# Patient Record
Sex: Male | Born: 1935 | Race: White | Hispanic: No | Smoking: Former smoker
Health system: Southern US, Community
[De-identification: ages and names within clinical notes are randomized; demographics above are authoritative.]

## PROBLEM LIST (undated history)

## (undated) DIAGNOSIS — R609 Edema, unspecified: Secondary | ICD-10-CM

## (undated) DIAGNOSIS — M5136 Other intervertebral disc degeneration, lumbar region: Secondary | ICD-10-CM

## (undated) DIAGNOSIS — R945 Abnormal results of liver function studies: Secondary | ICD-10-CM

## (undated) DIAGNOSIS — N400 Enlarged prostate without lower urinary tract symptoms: Secondary | ICD-10-CM

## (undated) DIAGNOSIS — N529 Male erectile dysfunction, unspecified: Secondary | ICD-10-CM

## (undated) DIAGNOSIS — E785 Hyperlipidemia, unspecified: Secondary | ICD-10-CM

## (undated) DIAGNOSIS — K226 Gastro-esophageal laceration-hemorrhage syndrome: Secondary | ICD-10-CM

## (undated) DIAGNOSIS — M6282 Rhabdomyolysis: Secondary | ICD-10-CM

## (undated) DIAGNOSIS — N323 Diverticulum of bladder: Secondary | ICD-10-CM

## (undated) DIAGNOSIS — K219 Gastro-esophageal reflux disease without esophagitis: Secondary | ICD-10-CM

## (undated) DIAGNOSIS — D649 Anemia, unspecified: Secondary | ICD-10-CM

## (undated) DIAGNOSIS — B029 Zoster without complications: Secondary | ICD-10-CM

## (undated) DIAGNOSIS — G319 Degenerative disease of nervous system, unspecified: Secondary | ICD-10-CM

## (undated) DIAGNOSIS — N4340 Spermatocele of epididymis, unspecified: Secondary | ICD-10-CM

## (undated) DIAGNOSIS — M419 Scoliosis, unspecified: Secondary | ICD-10-CM

## (undated) DIAGNOSIS — K449 Diaphragmatic hernia without obstruction or gangrene: Secondary | ICD-10-CM

## (undated) DIAGNOSIS — M810 Age-related osteoporosis without current pathological fracture: Secondary | ICD-10-CM

## (undated) DIAGNOSIS — R531 Weakness: Secondary | ICD-10-CM

## (undated) DIAGNOSIS — M1990 Unspecified osteoarthritis, unspecified site: Secondary | ICD-10-CM

## (undated) DIAGNOSIS — R7989 Other specified abnormal findings of blood chemistry: Secondary | ICD-10-CM

## (undated) DIAGNOSIS — N179 Acute kidney failure, unspecified: Secondary | ICD-10-CM

## (undated) DIAGNOSIS — Z9181 History of falling: Secondary | ICD-10-CM

## (undated) DIAGNOSIS — I679 Cerebrovascular disease, unspecified: Secondary | ICD-10-CM

## (undated) DIAGNOSIS — Z8719 Personal history of other diseases of the digestive system: Secondary | ICD-10-CM

## (undated) DIAGNOSIS — I10 Essential (primary) hypertension: Secondary | ICD-10-CM

## (undated) DIAGNOSIS — M545 Low back pain: Secondary | ICD-10-CM

## (undated) HISTORY — DX: Weakness: R53.1

## (undated) HISTORY — DX: Other intervertebral disc degeneration, lumbar region: M51.36

## (undated) HISTORY — DX: Diverticulum of bladder: N32.3

## (undated) HISTORY — PX: REPLACEMENT TOTAL KNEE BILATERAL: SUR1225

## (undated) HISTORY — DX: Diaphragmatic hernia without obstruction or gangrene: K44.9

## (undated) HISTORY — DX: Age-related osteoporosis without current pathological fracture: M81.0

## (undated) HISTORY — DX: Personal history of other diseases of the digestive system: Z87.19

## (undated) HISTORY — DX: Spermatocele of epididymis, unspecified: N43.40

## (undated) HISTORY — DX: Essential (primary) hypertension: I10

## (undated) HISTORY — DX: Zoster without complications: B02.9

## (undated) HISTORY — DX: Cerebrovascular disease, unspecified: I67.9

## (undated) HISTORY — DX: Anemia, unspecified: D64.9

## (undated) HISTORY — DX: Unspecified osteoarthritis, unspecified site: M19.90

## (undated) HISTORY — DX: Male erectile dysfunction, unspecified: N52.9

## (undated) HISTORY — DX: Hypercalcemia: E83.52

## (undated) HISTORY — DX: Edema, unspecified: R60.9

## (undated) HISTORY — DX: Scoliosis, unspecified: M41.9

## (undated) HISTORY — DX: History of falling: Z91.81

## (undated) HISTORY — DX: Gastro-esophageal reflux disease without esophagitis: K21.9

## (undated) HISTORY — DX: Rhabdomyolysis: M62.82

## (undated) HISTORY — DX: Gastro-esophageal laceration-hemorrhage syndrome: K22.6

## (undated) HISTORY — DX: Acute kidney failure, unspecified: N17.9

## (undated) HISTORY — DX: Low back pain: M54.5

## (undated) HISTORY — DX: Benign prostatic hyperplasia without lower urinary tract symptoms: N40.0

## (undated) HISTORY — PX: X-STOP IMPLANTATION: SHX2677

## (undated) HISTORY — DX: Hyperlipidemia, unspecified: E78.5

## (undated) HISTORY — DX: Abnormal results of liver function studies: R94.5

## (undated) HISTORY — DX: Degenerative disease of nervous system, unspecified: G31.9

## (undated) HISTORY — DX: Other specified abnormal findings of blood chemistry: R79.89

---
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
## 1984-08-18 DIAGNOSIS — Z8719 Personal history of other diseases of the digestive system: Secondary | ICD-10-CM

## 1984-08-18 DIAGNOSIS — K226 Gastro-esophageal laceration-hemorrhage syndrome: Secondary | ICD-10-CM

## 1984-08-18 HISTORY — DX: Personal history of other diseases of the digestive system: Z87.19

## 1984-08-18 HISTORY — DX: Gastro-esophageal laceration-hemorrhage syndrome: K22.6

## 2001-04-27 ENCOUNTER — Ambulatory Visit (HOSPITAL_COMMUNITY): Admission: RE | Admit: 2001-04-27 | Discharge: 2001-04-27 | Payer: Self-pay | Admitting: *Deleted

## 2001-04-27 ENCOUNTER — Encounter (INDEPENDENT_AMBULATORY_CARE_PROVIDER_SITE_OTHER): Payer: Self-pay | Admitting: *Deleted

## 2002-08-18 HISTORY — PX: OTHER SURGICAL HISTORY: SHX169

## 2003-07-12 ENCOUNTER — Encounter: Admission: RE | Admit: 2003-07-12 | Discharge: 2003-07-12 | Payer: Self-pay | Admitting: Internal Medicine

## 2004-08-18 HISTORY — PX: INGUINAL HERNIA REPAIR: SUR1180

## 2004-11-26 ENCOUNTER — Ambulatory Visit (HOSPITAL_COMMUNITY): Admission: RE | Admit: 2004-11-26 | Discharge: 2004-11-26 | Payer: Self-pay | Admitting: General Surgery

## 2004-11-26 ENCOUNTER — Ambulatory Visit (HOSPITAL_BASED_OUTPATIENT_CLINIC_OR_DEPARTMENT_OTHER): Admission: RE | Admit: 2004-11-26 | Discharge: 2004-11-26 | Payer: Self-pay | Admitting: General Surgery

## 2005-06-18 ENCOUNTER — Encounter: Admission: RE | Admit: 2005-06-18 | Discharge: 2005-06-18 | Payer: Self-pay | Admitting: Orthopaedic Surgery

## 2005-07-09 ENCOUNTER — Encounter: Admission: RE | Admit: 2005-07-09 | Discharge: 2005-07-09 | Payer: Self-pay | Admitting: Orthopaedic Surgery

## 2005-07-24 ENCOUNTER — Encounter: Admission: RE | Admit: 2005-07-24 | Discharge: 2005-07-24 | Payer: Self-pay | Admitting: Orthopaedic Surgery

## 2006-03-24 ENCOUNTER — Inpatient Hospital Stay (HOSPITAL_COMMUNITY): Admission: RE | Admit: 2006-03-24 | Discharge: 2006-03-27 | Payer: Self-pay | Admitting: Orthopaedic Surgery

## 2006-03-24 ENCOUNTER — Encounter (INDEPENDENT_AMBULATORY_CARE_PROVIDER_SITE_OTHER): Payer: Self-pay | Admitting: *Deleted

## 2007-04-27 ENCOUNTER — Inpatient Hospital Stay (HOSPITAL_COMMUNITY): Admission: RE | Admit: 2007-04-27 | Discharge: 2007-04-30 | Payer: Self-pay | Admitting: Orthopaedic Surgery

## 2007-10-04 ENCOUNTER — Encounter: Admission: RE | Admit: 2007-10-04 | Discharge: 2007-10-04 | Payer: Self-pay | Admitting: Orthopaedic Surgery

## 2007-10-20 ENCOUNTER — Encounter: Admission: RE | Admit: 2007-10-20 | Discharge: 2007-10-20 | Payer: Self-pay | Admitting: Orthopaedic Surgery

## 2007-12-22 ENCOUNTER — Encounter: Admission: RE | Admit: 2007-12-22 | Discharge: 2008-03-21 | Payer: Self-pay | Admitting: Internal Medicine

## 2008-09-29 ENCOUNTER — Encounter: Admission: RE | Admit: 2008-09-29 | Discharge: 2008-09-29 | Payer: Self-pay | Admitting: Orthopaedic Surgery

## 2008-12-28 ENCOUNTER — Encounter: Admission: RE | Admit: 2008-12-28 | Discharge: 2008-12-28 | Payer: Self-pay | Admitting: Neurosurgery

## 2009-07-03 ENCOUNTER — Inpatient Hospital Stay (HOSPITAL_COMMUNITY): Admission: RE | Admit: 2009-07-03 | Discharge: 2009-07-06 | Payer: Self-pay | Admitting: Neurosurgery

## 2009-10-16 HISTORY — PX: BACK SURGERY: SHX140

## 2010-11-20 LAB — CBC
HCT: 42.4 % (ref 39.0–52.0)
Hemoglobin: 14.8 g/dL (ref 13.0–17.0)
MCHC: 34.9 g/dL (ref 30.0–36.0)
MCV: 98.3 fL (ref 78.0–100.0)
Platelets: 217 10*3/uL (ref 150–400)
RBC: 4.31 MIL/uL (ref 4.22–5.81)
RDW: 15.1 % (ref 11.5–15.5)
WBC: 7.3 10*3/uL (ref 4.0–10.5)

## 2010-11-20 LAB — BASIC METABOLIC PANEL
BUN: 14 mg/dL (ref 6–23)
CO2: 32 mEq/L (ref 19–32)
Calcium: 9.4 mg/dL (ref 8.4–10.5)
Chloride: 100 mEq/L (ref 96–112)
Creatinine, Ser: 1 mg/dL (ref 0.4–1.5)
GFR calc Af Amer: >60 mL/min (ref >60)
GFR calc non Af Amer: >60 mL/min (ref >60)
Glucose, Bld: 85 mg/dL (ref 70–99)
Potassium: 4.5 mEq/L (ref 3.5–5.1)
Sodium: 137 mEq/L (ref 135–145)

## 2010-12-31 NOTE — Discharge Summary (Signed)
NAMESAMPSON, SELF                  ACCOUNT NO.:  [id number]   MEDICAL RECORD NO.:  [id number]          PATIENT TYPE:  INP   LOCATION:  5030                         FACILITY:  [facility]   PHYSICIAN:  Claude Manges. Whitfield, M.D.DATE OF BIRTH:  Sep 12, 1924   DATE OF ADMISSION:  04/27/2007  DATE OF DISCHARGE:  04/30/2007                               DISCHARGE SUMMARY   ADMISSION DIAGNOSES:  1. Osteoarthritis, right knee.  2. Hyperlipidemia.  3. Benign prostatic hypertrophy.  4. Blind.   DISCHARGE DIAGNOSES:  1. Status post right total knee arthroplasty.  2. Hyperlipidemia.  3. Benign prostatic hypertrophy.  4. Hyponatremia, probably chronic.  5. Acute blood loss anemia secondary to surgery.   HISTORY OF PRESENT ILLNESS:  Mr. Winterhalter is an 77 year old male status post  left total knee with good results. He now has pain in right knee which  has been worsening. Previously noted to have OA of the right knee. Now  having pain with ADLs and also at night. Pain is described as a  constant, achy pain. Radiographs of the right knee show end-stage  osteoarthritis. The patient admitted to undergo right total knee  arthroplasty.   SURGICAL PROCEDURE:  The patient was taken to the operating room on  April 27, 2007 by Dr. Norlene Campbell assisted by Nathanial Rancher, P.A.-  C. The patient underwent general anesthesia and supplemental femoral  nerve block and then underwent a right total knee replacement using  computer navigation. The following components were used:  A large right  femoral component, a large metal-backed patella, size 5 tibial tray and  a 10-mm tibial bearing. The patient tolerated the procedure well and  returned to recovery room in good and stable condition.   CONSULTS:  The following consults were obtained while patient was  hospitalized:  PT, OT, case management, pharmacy.   HOSPITAL COURSE:  On postoperative day #1, the patient was afebrile.  Vital signs stable. H&H 12.2 and 35.5.  Sodium was 133, potassium 4.2.  PT/INR was 14.5 and 1.1.   On postoperative day #2, the patient's T-max was 100.2. Otherwise, vital  signs stable. H&H 10.9 and 31.7. White count was elevated at 11,600.  Incentive spirometry was then instituted. Was also noted patient had  hyponatremia, sodium 131. PT/INR was 17.7 and 1.4.   Postoperative day #3, the patient was sitting at bedside doing well. No  complaints. No shortness of breath. No chest pain. No dizziness. The  patient tolerated diet well. No bowel movement. Positive flatus.   Pain under good control. Patient afebrile. Vital signs stable. H&H 10.9  and 31.5. White count trended down to 10,100. Sodium was low at 130;  this was felt to be chronic in nature, as patient had sodium of 127 on  admission. Otherwise, the patient progressing well with physical therapy  and desiring to be discharged to [facility]. The  patient's PT/INR were as follows:  PT 17.3, INR 1.4.   MEDICATIONS ON FLOOR:  1. Coumadin as dosed by pharmacy at 1800 hours.  2. Heparin 3000 units subcutaneous q.12h. at 10 a.m. and 10  p.m. until      Coumadin therapeutic.  3. Colace 100 mg 1 p.o. b.i.d. at 10 a.m. and 10 p.m.  4. Protonix 40 mg 1 p.o. at 8 a.m.  5. Senokot 1 tablet p.o. p.r.n.  6. Percocet 5/325 one to two tablets p.o. q.4h. p.r.n. pain.  7. Tylenol 325 to 650 one p.o. q.4h. p.r.n.  8. Reglan 10 mg 1 p.o. q.8h. p.r.n.  9. Robaxin 500 mg 1 p.o. q.6h. p.r.n. spasm.   The patient will need to be on Coumadin for one month.   LABORATORY DATA:  Routine labs on admission:  CBC - white count 7300,  hemoglobin 15.5, hematocrit 45.6, platelets 285. Coags on admission - PT  13.4, INR 1; date of discharge - April 30, 2007, PT was 17.3 and INR  was 1.4.   Routine chemistries on admission - sodium 127 low, potassium 3.5,  chloride 88, bicarb 30, glucose 94, BUN 11, creatinine 0.89.   Hepatic enzymes on admission - all values within normal  limits.   UA on admission was negative. Urine culture:  9000 colonies per mL,  insignificant growth.   X-rays:  Preoperative chest x-ray two views dated April 21, 2007  showed evidence of old granulomatous disease, no active process.   EKG:  EKG dated April 21, 2007 showed sinus bradycardia. Heart was 97  beats per minute, PR interval 148 milliseconds, PRT axis 77/41/56.   DISCHARGE INSTRUCTIONS:  Floor medications will be adjusted by skilled  nursing facility physician.   DIET:  Advance as tolerated, regular diet.   SPECIAL INSTRUCTIONS:  The patient is 50% partial weight bearing with  walker. CPM is 0 to 90 degrees 6 to 8 hours a day, increase by 10  degrees daily.   WOUND CARE:  The wound is to be kept clean and dry. Change dressing  daily. May shower after two days with no drainage. Call with any signs  of infection, temperature greater than 101.5.   FOLLOW UP:  The patient needs followup with Dr. Cleophas Dunker in the office  10 days from discharge. Please call office at [phone]. Staples should  be removed at that time. X-rays will be taken.   CONDITION ON DISCHARGE:  The patient was discharged to skilled nursing  facility in good, stable condition.      Richardean Canal, P.A.      Claude Manges. Cleophas Dunker, M.D.  Electronically Signed    GC/MEDQ  D:  04/30/2007  T:  04/30/2007  Job:  [id number]   cc:   Claude Manges. Cleophas Dunker, M.D.

## 2010-12-31 NOTE — Op Note (Signed)
[name], [name]                  ACCOUNT NO.:  [id number]   MEDICAL RECORD NO.:  [id number]          PATIENT TYPE:  INP   LOCATION:  2550                         FACILITY:  [facility]   PHYSICIAN:  Claude Manges. Whitfield, M.D.DATE OF BIRTH:  07/26/1925   DATE OF PROCEDURE:  04/27/2007  DATE OF DISCHARGE:                               OPERATIVE REPORT   PREOPERATIVE DIAGNOSIS:  End-stage osteoarthritis right knee.   POSTOPERATIVE DIAGNOSIS:  End-stage osteoarthritis right knee.   PROCEDURE:  Right total knee replacement with computer navigation.   SURGEON:  Claude Manges. Cleophas Dunker, M.D.   ASSISTANT:  Arnoldo Morale, PAC.   ANESTHESIA:  General with supplemental femoral nerve block.   COMPLICATIONS:  None.   COMPONENTS:  DePuy LCS large femoral component.  A #5 rotating keeled  tibial component to 10 mm bridging bearing, a three peg metal backed  patella, ball was secured polymethyl methacrylate.   PROCEDURE:  With the patient comfortable on the operating table and  under general orotracheal anesthesia, nursing staff inserted a Foley  catheter.  Urine was clear yellow.  The patient did receive a  preoperative femoral nerve block for postoperative pain control.   The right lower extremity was placed in a thigh tourniquet.  The leg was  then prepped with Betadine scrub and DuraPrep from the thigh tourniquet  to the midfoot.  Sterile draping was performed.  The extremity still  elevated, it was Esmarch exsanguinated with a proximal tourniquet to 350  mmHg.   A midline longitudinal incision was made centered about the patella  extending from the superior pouch to the tibial tubercle via sharp  dissection, incision was carried down to subcutaneous tissue.  First  layer of capsule was incised in the midline.  A medial parapatellar  incision was made in a straight longitudinal line with the Bovie.  The  joint was entered with a clear yellow joint effusion.  The patella was  everted 180 degrees  and the knee flexed to 90 degrees.   There were large osteophytes along the medial and lateral femoral  condyle, complete absence of articular cartilage along the lateral  compartment and/or to great extent, the medial compartment.  The patient  did not have a fixed deformity as I could  correct the knee to neutral  from both varus and valgus position.   A synovectomy was performed.  Osteophytes were removed.   Computer navigation was utilized.  The Shantz pins were placed in the  distal femur x2 and the proximal tibia x2.  The computer arrays were  then applied.  The normal anatomical axis was determined and then  morphed points were established along the tibia and femur.  We had  templated a large femoral component #5 tibial component preoperatively.  The computer confirmed the above.   Based on the computer determinations, the first cut was made  transversely along the proximal tibia with about a 7-1/2 degree  posterior angle of declination.  We checked our alignment with excellent  initial cut.  We then established flexion/extension gaps in tension, we  were a little  bit tight medially and we released a little bit more along  the medial tibial plateau releasing the medial collateral ligament in a  subperiosteal fashion.  At that point we had symmetrical  flexion/extension gaps.   Subsequent cuts were then made on the femur, checking each after making  the cuts with a verifier.  The MCL and LCL remained intact throughout  the operative procedure.   Lamina spreader was then inserted along the medial and lateral  compartments using a Bovie.  Medial and lateral menisci were resected.  ACL and PCL were also resected.  Osteophytes along the posterior femoral  condyle were removed medially and laterally.  There was numerous loose  bodies that were removed that were less than a centimeter in diameter.  We did a synovectomy and there was no appreciable synovitis remaining.   Final cut  was then made on the femur for the tapered cuts along the  femoral condyle medially and laterally, anteriorly and posteriorly.   Retractor was then placed about the tibia.  We trialed a #5 tibial tray  which was confirmed with the computer.  Center hole was then made  followed by the keeled cut.  With the metallic tray in place, the trial  10 mm bridging bearing was then inserted followed by the trial femur.  The knee was then reduced and through full range of motion, there was no  opening with varus-valgus stress.  We had beautiful alignment of the  tibial tray without any malrotation.  We had full extension and flexion  well beyond 110 degrees.   The patella was then prepared by removing 12 mm of bone leaving 13 mm of  patella thickness.  The three-hole old guide was then inserted followed  by a trial patella.  This was reduced and with a full range of motion,  no subluxation.   Trial components were removed.  The joint was copiously irrigated with  jet saline.  The final components were then inserted with polymethyl  methacrylate.  This will be applied to the tibia removing any extraneous  methacrylate with a Glorious Peach.  The 10 mm bridging bearing was then applied  followed by the cemented large femoral component.  Against extraneous  methacrylate was removed.  The patella was applied with methacrylate and  patellar clamp.   After complete maturation of the methacrylate, the joint was inspected.  Any extraneous methacrylate was removed with an osteotome.  Wound was  again irrigated with the jet saline.   Bone wax was used to obtain hemostasis around any bleeding bone.  Tourniquet was deflated with immediate capillary refill to the wound.  Gross bleeders were Bovie coagulated.   The deep capsule was then closed with #1 Ethibond, superficial capsule  with a 2-0 Vicryl, subcu with 2-0 Vicryl, skin closed with skin clips.  Sterile bulky dressing was applied.   The patient tolerated  the procedure without complications.      Claude Manges. Cleophas Dunker, M.D.  Electronically Signed     PWW/MEDQ  D:  04/27/2007  T:  04/27/2007  Job:  [id number]

## 2011-01-03 NOTE — Op Note (Signed)
[place]  Patient:    [name], LISBON Visit Number: [id number] MRN: [id number]          Service Type: END Location: ENDO Attending Physician:  Sabino Gasser Proc. Date: 04/27/01 Admit Date:  04/27/2001                             Operative Report  PROCEDURE:  Upper endoscopy.  INDICATIONS:  GERD.  ANESTHESIA:  Demerol 50 mg, Versed 5 mg.  DESCRIPTION OF PROCEDURE:  With the patient nonlucidated and in the left lateral decubitus position, the Olympus videoscopic endoscope was inserted in the mouth and passed under direct vision through the esophagus.  There was a question of GERD and esophagitis and possible Barretts.  Photographs and biopsies were taken.  We entered into the stomach, fundus, body, and antrum. However, when we visualized antrum it showed questionable antritis.  It was also photographed and biopsied.  The duodenum bulb and second portion of duodenum appeared normal.  From this point, the endoscope was slowly withdrawn taking circumferential views of the entire duodenal mucosa until the endoscope then pulled back into the stomach, placed in retroflexion to view the stomach from below.  The endoscope was then straightened and withdrawn taking circumferential views of remaining gastric and esophageal mucosa stopping to photograph along the way.  The patients vital signs and pulse oximetry remained stable.  The patient tolerated the procedure well and without apparent complications.  FINDINGS:  Question of Barretts esophagus above the hiatal hernia.  Antritis. Both biopsied.  Await biopsy report.  The patient will call me for results and follow up with me as an outpatient.  Proceed to colonoscopy as planned. Attending Physician:  Sabino Gasser DD:  04/27/01 TD:  04/27/01 Job: [id number]

## 2011-01-03 NOTE — Op Note (Signed)
[name], [name]                  ACCOUNT NO.:  [id number]   MEDICAL RECORD NO.:  [id number]          PATIENT TYPE:  AMB   LOCATION:  DSC                          FACILITY:  [facility]   PHYSICIAN:  Gita Kudo, M.D. DATE OF BIRTH:  Jan 10, 1925   DATE OF PROCEDURE:  11/26/2004  DATE OF DISCHARGE:                                 OPERATIVE REPORT   OPERATIVE PROCEDURE:  Repair right inguinal hernia - combined direct and  indirect.   SURGEON:  Gita Kudo, M.D.   ANESTHESIA:  General.   PREOPERATIVE DIAGNOSIS:  Right inguinal hernia.   POSTOPERATIVE DIAGNOSIS:  Right inguinal hernia, combined direct and  indirect.   CLINICAL SUMMARY:  77 year old male in excellent general health, has a two  to three-week history of bulge in his right groin and hernia on physical  exam. Comes in for elective repair.   OPERATIVE FINDINGS:  The patient had a medium-sized indirect sac that was  empty of contents at time of surgery. He also had a medium direct hernia.   OPERATIVE PROCEDURE:  Under satisfactory general endotracheal anesthesia,  having received 1.0 grams Ancef preop, the patient was positioned, prepped  and draped in the standard fashion. During the procedure, a total of 30 cc  of 0.5% Marcaine with epinephrine was infiltrated for postop analgesia.  Transverse lower abdominal incision made and carried down to and through the  external ring and external oblique. Self-retaining retractors were employed  and gave excellent exposure. Cord and contents mobilized with a Penrose  drain, indirect sac identified, dissected high and twisted.  Secured with a  0 Prolene suture ligature and excised. Stump infiltrated with Marcaine. Then  a lipoma of the cord was ligated high and removed. Following this, the floor  of the canal was opened from the pubis to the internal ring and finger  dissection used to develop the preperitoneal space and the contents were  pushed away with a finger and held  off with a moistened gauze. The circular  portion of the Kugel mesh was then anchored to Cooper's ligament with 0  Prolene suture and unfolded inferiorly and laterally. The gauze was removed  and the mesh unfolded superiorly and medially. Another 0 Prolene was used to  approximate the medial aspect of the mesh to the undersurface of the  abdominal wall. Then the floor of the canal was closed over the mesh using a  running zero Prolene and taking intermittent bites of the mesh. When tied at  the internal ring, then ring was snug and the ends of the suture left long.  Then the oval portion of the mesh was tailored to fit into the floor of the  canal with a slit made to go around the cord structures. It was anchored at  the internal ring with a previous suture and then tacked around the  periphery with three sutures to the internal oblique above, soft tissue near  the pubis, inguinal ligament below. The tails were then brought around the  cord and sutured to each other.   The wound was then lavaged  with saline, checked for hemostasis and closed in  layers. Running 2-0 Vicryl approximated the external oblique, interrupted 2-  0 Vicryl for deep fascia, 3-0 Vicryl subcu and Steri-Strips for skin.  Sterile dressings applied and the patient went to the recovery room in good  condition. He will be kept overnight because of his age and hopefully go  home tomorrow.      MRL/MEDQ  D:  11/26/2004  T:  11/26/2004  Job:  [id number]   cc:   Janae Bridgeman. Eloise Harman., M.D.  [address]  [place]  [place] [address]  Fax: [phone]

## 2011-01-03 NOTE — H&P (Signed)
NAMEGUINN, DELAROSA                  ACCOUNT NO.:  [id number]   MEDICAL RECORD NO.:  [id number]          PATIENT TYPE:  INP   LOCATION:  NA                           FACILITY:  [facility]   PHYSICIAN:  Claude Manges. Whitfield, M.D.DATE OF BIRTH:  1925-08-03   DATE OF ADMISSION:  DATE OF DISCHARGE:                                HISTORY & PHYSICAL   DATE OF ADMISSION:  March 24, 2006   CHIEF COMPLAINT:  Left knee pain for the last 3 years.   HISTORY OF PRESENT ILLNESS:  This 77 year old white male patient presented  to Dr. Cleophas Dunker with a 3-year history of gradual onset progressively  worsening left knee pain.  He has had no known injury or prior surgery to  the knee.  At this point the pain is described as constant with any  weightbearing.  It is a sharp sensation over the joint without radiation.  Pain increases with any side pressure and then decreases if he just is  very careful and puts direct weight on the knee.  The knee does pop at  times; it catches, grind, feels like it might give way, swells, and awakens  him at night.  There is no locking of the knee.  He is not currently taking  any medications for pain and is not ambulating with any assistive devices.  He has received cortisone and Hyalgan in the past with minimal relief.   ALLERGIES:  SHELLFISH causes a rash but he has had no problems with topical  iodine in the past.   CURRENT MEDICATIONS:  None.   PAST MEDICAL HISTORY:  He denies any history of hypertension, diabetes  mellitus, thyroid disease, hiatal hernia, peptic ulcer disease, reflux,  heart disease, asthma, or any other chronic medical condition.   PAST SURGICAL HISTORY:  1. Right inguinal herniorrhaphy in 2006.  2. Repair of left club toe 2004.  3. Tonsillectomy 1939.   He denies any complications from the above-mentioned procedures.   SOCIAL HISTORY:  He has a 16 pack-year history of cigarette smoking which he  quit in 1962.  He drinks alcohol daily, about two  drinks a day.  He does not  use tobacco or snuff and does not use any drugs.  He lives by himself at  [facility] assisted [facility].  It is a [facility].  He is divorced  and has two daughters.  His medical doctor is Dr. Dimas Alexandria at  [facility].   FAMILY HISTORY:  Mother died in her late 29s.  Dad died in his late 73s with  history of alcoholism.  He had one sister who just recently died at age 51.  His grandparents due have a history of pancreatic cancer.  Daughters are age  56 and 64 and they are alive and well.   REVIEW OF SYSTEMS:  He did have whooping cough as a child.  He does have  some nocturia three to four times a night.  All other systems are negative  and noncontributory.   PHYSICAL EXAMINATION:  GENERAL:  Well-developed, well-nourished white male,  no  acute distress.  Walks with a slightly kyphotic curve to his low back and  with a limp on his left side.  Mood and affect are appropriate.  Talks  easily with the examiner.  Height 5 feet 9 inches, weight 170 pounds, BMI is  24.  VITAL SIGNS:  Temperature 98.3 degrees Fahrenheit, pulse 64, respirations 16  and BP 118/56.  HEENT:  Normocephalic, atraumatic without frontal or maxillary sinus  tenderness to palpation.  Conjunctivae pink.  Sclerae anicteric.  PERLA.  EOMs intact.  No visible external ear deformities.  Hearing grossly intact.  Tympanic membranes pearly gray bilaterally with good light reflex.  Nose and  nasal septum midline.  Nasal mucosa pink and moist without exudates or  polyps noted.  Buccal mucosa pink and moist.  Dentition in good repair.  Pharynx without erythema or exudates.  Tongue and uvula midline.  Tongue  without fasciculations and uvula rises equally with phonation.  NECK:  No visible masses or lesions noted.  Trachea midline.  No palpable  lymphadenopathy nor thyromegaly.  Carotids +2 bilaterally without bruits.  Full range of motion and nontender to palpation  along the cervical spine.  CARDIOVASCULAR:  Heart rate and rhythm regular.  S1, S2 present without  rubs, clicks or murmurs noted.  RESPIRATORY:  Respirations even and unlabored.  Breath sounds clear to  auscultation bilaterally without rales or wheezes noted.  ABDOMEN:  Rounded abdominal contour.  Bowel sounds present x4 quadrants.  Soft, nontender to palpation without hepatosplenomegaly nor CVA tenderness.  Femoral pulses +2 bilaterally.  Nontender to palpation along the vertebral  column.  BREASTS/GENITOURINARY/RECTAL:  These exams deferred at this time.  MUSCULOSKELETAL:  No obvious deformities bilateral upper extremities with  full range of motion of these extremities without pain.  Radial pulses +2  bilaterally.  Full range of motion of his hips, ankles and toes bilaterally.  DP and PT pulses +2.  No lower extremity edema.  No calf pain with palpation  and negative Homans' sign bilaterally.  Right knee has full extension and  flexion to 135 degrees with minimal crepitus.  There is no pain with  palpation along the joint line.  No effusion.  Stable to varus and valgus  stress.  Negative anterior drawer.  Left knee:  Skin intact.  No erythema or  ecchymosis.  He has full extension and flexion to 110 degrees with a  moderate amount of crepitus.  There is a +2-3 effusion in the knee but no  pain with palpation along the joint line at this time.  Stable to varus and  valgus stress.  Negative anterior drawer.  NEUROLOGIC:  Alert and oriented x3.  Cranial nerves II-XII are grossly  intact.  Strength 5/5 bilateral upper and lower extremities.  Rapid  alternating movements intact.  Deep tendon reflexes 2+ bilateral upper and  lower extremities.   RADIOLOGIC FINDINGS:  X-rays taken of both knees in October 2006 show  considerable osteoarthritis and end-stage osteoarthritis on the right with  an increased valgus position and no joint space laterally.  There are signs of tricompartmental  arthritis.   IMPRESSION:  End-stage osteoarthritis bilateral knees, left worse than  right.   PLAN:  Mr. Granja will be admitted to [facility] on March 24, 2006,  where he will undergo a left total knee arthroplasty by Dr. Claude Manges.  Whitfield.  He will undergo all the routine preoperative laboratory tests  and studies prior to this procedure.  If we have any  medical issues while he  is hospitalized we will consult the hospitalists that cover for Dr. Lendell Caprice  at [facility].  He has been cleared for surgery preoperatively by  Dr. Lendell Caprice.      Legrand Pitts Duffy, P.A.      Claude Manges. Cleophas Dunker, M.D.  Electronically Signed    KED/MEDQ  D:  03/17/2006  T:  03/17/2006  Job:  [id number]

## 2011-01-03 NOTE — Op Note (Signed)
NAMEFREDDIE, [name]                  ACCOUNT NO.:  [id number]   MEDICAL RECORD NO.:  [id number]          PATIENT TYPE:  INP   LOCATION:  2899                         FACILITY:  [facility]   PHYSICIAN:  Claude Manges. Whitfield, M.D.DATE OF BIRTH:  Dec 24, 1924   DATE OF PROCEDURE:  03/24/2006  DATE OF DISCHARGE:                                 OPERATIVE REPORT   PREOPERATIVE DIAGNOSIS:  End-stage osteoarthritis, left knee.   POSTOPERATIVE DIAGNOSIS:  End-stage osteoarthritis, left knee.   PROCEDURE:  Left total knee replacement.   SURGEON:  Dr. Cleophas Dunker   ASSISTANT:  Jacqualine Code, [facility]   ANESTHESIA:  General orotracheal with supplemental femoral nerve block.   COMPLICATIONS:  None.   COMPONENTS:  DePuy LCS large femoral component, a 10 mm polyethylene  bridging bearing, #5 keeled rotating tibial tray, a large patella.  All  components were secured with polymethyl methacrylate.   PROCEDURE:  The patient comfortable on the operating table and under general  orotracheal anesthesia, the left lower extremity which had been  appropriately identified as the operative extremity was placed in a thigh  tourniquet.  Nursing staff inserted a Foley catheter with clear yellow  urine.  The left lower extremity was then prepped with Betadine scrub and  the DuraPrep from the tourniquet to the midfoot.  Sterile draping was  performed.  With the extremity still elevated, it was Esmarch exsanguinated  with a proximal tourniquet at 350 mmHg.   A midline longitudinal incision was made and via sharp dissection carried  down to the subcutaneous tissue.  The first layer of capsule was incised in  the midline, and medial parapatellar incision was then made with the Bovie.  There was a large, clear-yellow joint effusion and an abundant amount of  synovium.  Both beefy red and some areas of very pale, thickened synovium  specimens were sent for cytology.  A radical synovectomy was performed.   Computer  navigation was employed.  The Shantz pins were inserted in the  proximal tibia and in the distal femur.  The arrays were then applied.  The  anatomical points were then morphed to establish the appropriate tibial and  femoral sizes.  We checked flexion and extension gaps after initial cut on  the proximal tibia and because there was a fixed varus, medial release was  performed.   The computer identified a #5 rotating tibial tray and a large femoral  component based on these cuts we then made on the femur and the tibia using  a 10 mm flexion gap, ACL and PCL were sacrificed.  Medial collateral  ligament and LCL remained intact throughout the operative procedure.  There  were several loose bodies that were removed and synovectomy was performed  posteriorly.  We checked flexion/extension gap on several occasions, and  each were symmetrical.  Based on the computer model, we were able obtain  perfect alignment and varus valgus and no opening with varus valgus stress  using a 10 mm bridging bearing.  We had full extension.   After the lamina spreaders were inserted into the medial and  lateral  compartment, we removed remnants of ACL, PCL, and medial lateral menisci.  The posterior femoral condyles were removed of any osteophytes with a 1/2  inch osteotome.  I did not see a Baker's cyst.   The tibia was then prepared using the #5 template.  The center hole was then  made followed by the keeled cut.  The trial #5 tibial tray was then applied  followed by the 10 mm bridging bearing and the large femoral component.  We  had full extension checked with the computer and no opening with varus or  valgus stress and perhaps less than 1 degree of valgus.   The patella was then prepared by removing 12 mm of bone leaving 13 mm of  bony patella thickness.  The trial patella was then applied and  through a  full range of motion, there was no subluxation.   The trial components removed.  The joint was  copiously irrigated with jet  saline on at least two occasions.   The final components were then impacted with polymethyl methacrylate.  Initial component was the tibia.  Methacrylate was then applied, and it was  impacted flush on the proximal tibia.  Extraneous methacrylate was removed.  Then the final 10 mm polyethylene component was then applied followed by the  cemented femoral component.  The knee was placed in full extension with  compression across the components.  Extraneous methacrylate was removed.  We  had an excellent fit.  Patella was applied with methacrylate and the  patellar clamp.   After complete maturation hardening of the methacrylate, the joint was  explored, and any hardened extraneous methacrylate was removed with an  osteotome.  The joint was again irrigated with the jet saline.  We had  excellent range of motion, no opening of the varus or valgus stress, full  flexion, and we had just slight hyperextension about 1-2 degrees.   Tourniquet was deflated.  Gross bleeders were Bovie-coagulated.  Bone wax  was applied to bleeding bone surfaces.  Deep capsule was closed anatomically  with an interrupted #1 Ethibond.  Superficial capsule closed with a running  0 Vicryl, the subcu with 2-0 Vicryl, skin closed with skin clips.  The  capsule was infiltrated with 0.25% Marcaine; the patient did have a  preoperative femoral nerve block.   Sterile bulky dressing was applied.  Hemovac was not necessary.   The patient tolerated the procedure well without complications.      Claude Manges. Cleophas Dunker, M.D.  Electronically Signed     PWW/MEDQ  D:  03/24/2006  T:  03/24/2006  Job:  [id number]

## 2011-01-03 NOTE — Procedures (Signed)
[place]  Patient:    [name], [name] Visit Number: [id number] MRN: [id number]          Service Type: END Location: ENDO Attending Physician:  Sabino Gasser Dictated by:   Sabino Gasser, M.D. Admit Date:  04/27/2001                             Procedure Report  PROCEDURE:  Colonoscopy.  INDICATIONS:  Colon cancer screening.  ANESTHESIA:  Demerol 25 mg, Versed 2 mg.  DESCRIPTION OF PROCEDURE:  With the patient mildly sedated in the left lateral decubitus position, the Olympus videoscopic variable-stiffness colonoscope was inserted in the rectum after a normal rectal exam, passed under direct vision to the cecum, identified by the ileocecal valve and appendiceal orifice.  From this point the colonoscope was slowly withdrawn, taking circumferential views of the entire colonic mucosa, stopping only in the rectum, which appeared normal on direct and showed one single hemorrhoid on retroflex view.  The endoscope was straightened and withdrawn.  The patients vital signs and pulse oximetry remained stable.  The patient tolerated the procedure well without apparent complications.  FINDINGS:  Internal hemorrhoids, otherwise unremarkable colonoscopic examination.  PLAN:  See endoscopy note for further details. Dictated by:   Sabino Gasser, M.D. Attending Physician:  Sabino Gasser DD:  04/27/01 TD:  04/27/01 Job: [id number]

## 2011-01-03 NOTE — Discharge Summary (Signed)
NAMEJYMIR, DUNAJ                  ACCOUNT NO.:  [id number]   MEDICAL RECORD NO.:  [id number]          PATIENT TYPE:  INP   LOCATION:  5006                         FACILITY:  [facility]   PHYSICIAN:  Claude Manges. Whitfield, M.D.DATE OF BIRTH:  09/10/1924   DATE OF ADMISSION:  03/24/2006  DATE OF DISCHARGE:  03/27/2006                                 DISCHARGE SUMMARY   ADMITTING DIAGNOSES:  1. End-stage osteoarthritis.  2. History of right herniorrhaphy in 2006.  3. Repair of left club toe 2004.  4. History of tonsillectomy in 1939.   DISCHARGE DIAGNOSES:  1. Status post left total knee arthroplasty.  2. Acute blood loss anemia, secondary to surgery.  3. Gastric reflux, placed on Protonix.   HPI:  [name] is a 77 year old white male with three-year history of gradual  onset of worsening left-knee pain.  The patient has no known injury or prior  surgery to this knee.  The pain is described as constant with any  weightbearing or activity.  It is a sharp sensation over the joint, without  radiation.  Pain increases with side pressure and decreases if he just is  careful and puts direct weight on the knee.  Mechanical symptoms positive  for giving way.  He does have some waking pain.   He takes no medications for the pain, nor does he use any assistive devices.  He has received cortisone and __________  injections in the past with  minimal relief.   ALLERGIES:  SHELLFISH causes rash, but no problems with topical iodine in  the past.   CURRENT MEDICATIONS:  None.   SURGICAL PROCEDURE:  The patient was taken to the operating room on March 24, 2006, by Dr. Norlene Campbell, assisted by Jacqualine Code, P.A.-C.  The  patient was placed under general anesthesia with supplemental femoral nerve  block and then underwent a left total knee arthroplasty.  The following  components were used.  A DePuy LCS femoral component, 10 mm polyethylene  bridging bearing, a size 5 tibial tray and a large  patella.  All components  were secured with polymethyl methacrylate.  The patient tolerated the  procedure well and returned to recovery in good, stable condition.   CONSULTATIONS:  The following consults were obtained while the patient was  hospitalized.  PT/OT, case management.   MEDICATIONS ON THE FLOOR:  1. Coumadin per pharmacy protocol.  2. Heparin 3000 units subcu q.12h. until Coumadin therapeutic.  3. Colace 100 mg one p.o. twice daily.  4. Dilaudid PCA which was discontinued postoperative day #2.  5. Protonix 40 mg one twice daily.  6. Compazine 5-10 mg IV q.6h. p.r.n. nausea.  7. Reglan 10 mg IV q.6h. p.r.n. nausea.  8. Zofran 1 mg IV q.6h. p.r.n. nausea.  9. Benadryl 12.5-25 mg IV q.6h. p.r.n.  10.Benadryl 25 mg p.o. q.6h. p.r.n.  11.Senokot one tab p.o. p.r.n.  12.Fleet enema per rectum p.r.n.  13.Percocet 5/325 one or two tablets p.o. q.4-6h. p.r.n.  14.Tylenol 325-350 mg p.o. q.4h. p.r.n.  15.Reglan __________ .  16.Maalox suspension 30 ml p.r.n.  HOSPITAL COURSE:  Postoperative day #1, the patient was afebrile, vital  signs stable, H&H 12.9 and 37.8, white count was 12,300.  PT was 14.8, INR  1.1.  The patient complained of some gastric reflux and was placed on  Protonix.   Postoperative day #1, the patient was ambulating well in the hallway,  walking 150 feet.  Postoperative day #2, the patient with some continued  gastric reflux, somewhat improving.  Otherwise no complaints, no chest pain,  no shortness of breath, no calf pain, no nausea, no vomiting, tolerating  diet.  Pain under good control.  H&H 11.4 and 32.5, white count trending  down, was 9.8.  Patient afebrile, vital signs stable.  Mild hyponatremia.  The patient continued to progress well with physical therapy.  The patient's  PT was 19.7, INR was 1.6.  Patient planning on discharge to [facility] in  a.m., after working with physical therapy.  Labs will be checked in the a.m.  prior to the patient's  discharge and he will work with physical therapy.   LABORATORY DATA:  Routine labs on admission:  CBC:  All values within normal  limits.  Routine chemistries on admission:  Sodium was slightly decreased at  134, potassium was 4.1, chloride 99, bicarb 30, glucose 96, BUN 15,  creatinine 1.2, calcium was 9.4.  Hepatic enzymes on admission:  All values  within normal limits.  Urinalysis on admission was negative.   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  Vaguely nodular area of fibrosis suggestive of fibroma.   DISCHARGE MEDICATIONS:  1. Percocet 5/325 one to two tablets every 4-6 hours as needed for pain.  2. Coumadin 5 mg to take as directed by [facility].  3. Protonix 40 mg one tab daily for 30 days.  4. No aspirin products while on Coumadin.   DISCHARGE INSTRUCTIONS:  Diet:  No restrictions.   Wound care:  The patient is to keep the wound clean and dry, change the  dressing daily.  May shower after two days, but no drainage from wound site.   Weightbearing:  The patient is 50% weightbearing on the left leg with a  walker.   FOLLOWUP:  The patient needs to follow up with Dr. Cleophas Dunker, [phone], two  weeks postoperatively.   Followup with Dr. Lendell Caprice for heartburn, gastric reflux, 7-10 days after  discharge.   SPECIAL INSTRUCTIONS:  CPM 0-60 degrees 6-8 hours a day, increase by 5-10  degrees daily.   The patient is to be discharged in the a.m., if he remains stable.      Richardean Canal, P.A.      Claude Manges. Cleophas Dunker, M.D.  Electronically Signed    GC/MEDQ  D:  03/26/2006  T:  03/26/2006  Job:  [id number]

## 2011-05-27 ENCOUNTER — Other Ambulatory Visit: Payer: Self-pay | Admitting: Internal Medicine

## 2011-05-28 ENCOUNTER — Ambulatory Visit
Admission: RE | Admit: 2011-05-28 | Discharge: 2011-05-28 | Disposition: A | Discharge status: Home / Self Care | Payer: Medicare Other | Source: Ambulatory Visit | Attending: Internal Medicine | Admitting: Internal Medicine

## 2011-05-28 MED ORDER — IOHEXOL 350 MG/ML SOLN
100.0000 mL | Freq: Once | INTRAVENOUS | Status: AC | PRN
  Administered 2011-05-28: 80 mL via INTRAVENOUS

## 2011-05-30 LAB — PROTIME-INR
INR: 1
Prothrombin Time: 13.4
Prothrombin Time: 14.5
Prothrombin Time: 17.3 — ABNORMAL HIGH

## 2011-05-30 LAB — URINE CULTURE

## 2011-05-30 LAB — CROSSMATCH
ABO/RH(D): O POS
Antibody Screen: NEGATIVE

## 2011-05-30 LAB — COMPREHENSIVE METABOLIC PANEL
ALT: 18
AST: 25
Albumin: 3.8
Alkaline Phosphatase: 49
BUN: 11
CO2: 30
Calcium: 8.7
Chloride: 88 — ABNORMAL LOW
Creatinine, Ser: 0.89
GFR calc Af Amer: >60
GFR calc non Af Amer: >60
Glucose, Bld: 94
Potassium: 3.5
Sodium: 127 — ABNORMAL LOW
Total Bilirubin: 1.1
Total Protein: 7.1

## 2011-05-30 LAB — DIFFERENTIAL
Basophils Absolute: 0
Basophils Relative: 1
Eosinophils Absolute: 0.3
Eosinophils Relative: 4
Lymphocytes Relative: 41
Lymphs Abs: 3
Monocytes Absolute: 0.6
Monocytes Relative: 9
Neutro Abs: 3.4
Neutrophils Relative %: 46

## 2011-05-30 LAB — CBC
HCT: 31.7 — ABNORMAL LOW
HCT: 45.6
Hemoglobin: 15.5
MCHC: 34
MCHC: 34.4
MCHC: 34.5
MCV: 96.6
MCV: 96.6
Platelets: 203
Platelets: 211
Platelets: 212
Platelets: 285
RBC: 3.26 — ABNORMAL LOW
RBC: 4.72
RDW: 13.1
RDW: 13.6
RDW: 13.7
RDW: 13.8
WBC: 7.3

## 2011-05-30 LAB — URINALYSIS, ROUTINE W REFLEX MICROSCOPIC
Bilirubin Urine: NEGATIVE
Glucose, UA: NEGATIVE
Hgb urine dipstick: NEGATIVE
Ketones, ur: NEGATIVE
Nitrite: NEGATIVE
Protein, ur: NEGATIVE
Specific Gravity, Urine: 1.012
Urobilinogen, UA: 0.2
pH: 8.5 — ABNORMAL HIGH

## 2011-05-30 LAB — BASIC METABOLIC PANEL
BUN: 13
BUN: 9
BUN: 9
CO2: 26
CO2: 27
Calcium: 8.1 — ABNORMAL LOW
Calcium: 8.5
Chloride: 97
Chloride: 98
Creatinine, Ser: 0.83
Creatinine, Ser: 1.11
GFR calc Af Amer: >60
GFR calc non Af Amer: >60
Glucose, Bld: 118 — ABNORMAL HIGH
Glucose, Bld: 89
Potassium: 3.9
Potassium: 4.1

## 2011-05-30 LAB — APTT: aPTT: 30

## 2011-08-07 ENCOUNTER — Telehealth: Payer: Self-pay | Admitting: Gastroenterology

## 2011-08-08 NOTE — Telephone Encounter (Signed)
Patient will come in and see Mike Gip PA on 08/13/11 for black stools

## 2011-08-13 ENCOUNTER — Encounter: Payer: Self-pay | Admitting: Physician Assistant

## 2011-08-13 ENCOUNTER — Ambulatory Visit (INDEPENDENT_AMBULATORY_CARE_PROVIDER_SITE_OTHER): Payer: Medicare Other | Admitting: Physician Assistant

## 2011-08-13 DIAGNOSIS — M519 Unspecified thoracic, thoracolumbar and lumbosacral intervertebral disc disorder: Secondary | ICD-10-CM | POA: Insufficient documentation

## 2011-08-13 DIAGNOSIS — R9389 Abnormal findings on diagnostic imaging of other specified body structures: Secondary | ICD-10-CM

## 2011-08-13 DIAGNOSIS — K219 Gastro-esophageal reflux disease without esophagitis: Secondary | ICD-10-CM

## 2011-08-13 DIAGNOSIS — K625 Hemorrhage of anus and rectum: Secondary | ICD-10-CM

## 2011-08-13 DIAGNOSIS — R197 Diarrhea, unspecified: Secondary | ICD-10-CM

## 2011-08-13 MED ORDER — PEG-KCL-NACL-NASULF-NA ASC-C 100 G PO SOLR: 1.0000 | Freq: Once | ORAL | Status: DC

## 2011-08-13 NOTE — Progress Notes (Signed)
Subjective:    Patient ID: [name], male    DOB: May 30, 1925, 77 y.o.   MRN: [id number]  HPI [name] is a very nice 77 year old white male due to GI today referred by Dr. Selena Batten. He is previously known to Dr. Virginia Rochester. He comes in today for evaluation of change in bowel habits with loose stools and complaint of dark stools x2 months. He has had very remote endoscopy with Dr. Virginia Rochester in 2002 which showed a hiatal hernia and antritis .Colonoscopy also done in September of 2002 pertinent only for internal hemorrhoids.  The patient relates that he had back surgery in the spring and noted difficulty with urinary incontinence that started after that. He says around that same time he started having constipation, required milk of magnesia and then says his stools became dark and he has had dark stools intermittently since. Over the past couple of multiple months he has noted increased frequency of bowel movements having 3-4 loose bowel movements per day which she says are often very dark in color. He has not noted any bright red blood. He has no complaints of abdominal pain or cramping occasionally does get some dull aching in his rectum. His appetite has been fair his weight has been stable her in stays on For his reflux symptoms. He underwent a CT scan of the abdomen and pelvis in October of 2012 which did show some diffuse thickening of the sigmoid colon and rectum and also multiple urinary bladder diverticuli.  Most recent labs done 08/07/2011 showed WBC of 6.8 hemoglobin 13.2 hematocrit 39.6 MCV of 98.4 amine was 1.3 liver function studies normal..    Review of Systems  Constitutional: Negative.   HENT: Negative.   Eyes: Negative.   Respiratory: Negative.   Cardiovascular: Negative.   Gastrointestinal: Positive for diarrhea and blood in stool.  Genitourinary: Positive for enuresis.  Musculoskeletal: Negative.   Skin: Negative.   Hematological: Negative.   Psychiatric/Behavioral: Negative.    Outpatient  Encounter Prescriptions as of 08/13/2011  Medication Sig Dispense Refill  . dexlansoprazole (DEXILANT) 60 MG capsule Take 60 mg by mouth daily.        . Probiotic Product (ALIGN PO) Take 1 tablet by mouth daily.        . peg 3350 powder (MOVIPREP) 100 G SOLR Take 1 kit (100 g total) by mouth once.  1 kit  0    No Known Allergies     Objective:   Physical Exam well-developed elderly white male in no acute distress, pleasant. HEENT; nontraumatic normocephalic EOMI PERRLA sclera anicteric, Supple no JVD, cardiovascular; and rhythm with S1-S2 no murmur gallop, Pulmonary; clear bilaterally he is kyphotic, ;soft nontender nondistended no palpable mass or hepatosplenomegaly bowel sounds are active, Rectal; exam no external hemorrhoids noted, prostate is enlarged stool is Hemoccult-negative today, Extremities; no clubbing cyanosis or edema, Psych; mood and affect normal and appropriate        Assessment & Plan:   #97  77 year old white male with a several month history of change in bowel habits with loose stools and intermittent dark stools, currently Hemoccult negative. Most recent hemoglobin was normal. Patient had CT scan in October of 2012 suggesting thickening of the sigmoid colon and rectum consisitent with a colitis. Rule out proctitis versus colitis.  Plan ; scheduled for colonoscopy with Dr. Juanda Chance. Procedure was discussed in detail with the patient and he is agreeable to proceed.  Will defer any treatment until time of colonoscopy.  #2 Chronic GERD, continue Dexilant 60  mg daily

## 2011-08-13 NOTE — Progress Notes (Signed)
Reviewed, LLQ abd. Pain , could be divertic, mass , stricture etc. I agree with colon.

## 2011-08-13 NOTE — Progress Notes (Signed)
Reviewed and agree DB 

## 2011-08-13 NOTE — Patient Instructions (Signed)
You have been scheduled for a Colonoscopy. See separate instructions.  Pick up your prep kit from your pharmacy.  cc: Pearson Grippe, MD

## 2011-08-15 ENCOUNTER — Encounter: Payer: Self-pay | Admitting: Internal Medicine

## 2011-08-15 ENCOUNTER — Ambulatory Visit (AMBULATORY_SURGERY_CENTER): Payer: Medicare Other | Admitting: Internal Medicine

## 2011-08-15 DIAGNOSIS — K625 Hemorrhage of anus and rectum: Secondary | ICD-10-CM

## 2011-08-15 DIAGNOSIS — R197 Diarrhea, unspecified: Secondary | ICD-10-CM

## 2011-08-15 DIAGNOSIS — Z1211 Encounter for screening for malignant neoplasm of colon: Secondary | ICD-10-CM

## 2011-08-15 HISTORY — PX: COLONOSCOPY: SHX174

## 2011-08-15 MED ORDER — SODIUM CHLORIDE 0.9 % IV SOLN: 500.0000 mL | INTRAVENOUS | Status: DC

## 2011-08-15 NOTE — Op Note (Signed)
[facility] [address] [place], [place]  [address]  COLONOSCOPY PROCEDURE REPORT  PATIENT:  Gregory, Pope  MR#:  [id number] BIRTHDATE:  1924-10-11, 86 yrs. old  GENDER:  male ENDOSCOPIST:  Hedwig Morton. Juanda Chance, MD REF. BY:  Pearson Grippe, M.D. PROCEDURE DATE:  08/15/2011 PROCEDURE:  Colonoscopy 19147 ASA CLASS:  Class II INDICATIONS:  blood in stool, change in bowel habits last colon 2002 showed hemorrhoids MEDICATIONS:   These medications were titrated to patient response per physician's verbal order, Versed 4 mg, Fentanyl 50 mcg  DESCRIPTION OF PROCEDURE:   After the risks and benefits and of the procedure were explained, informed consent was obtained. Digital rectal exam was performed and revealed no rectal masses. The LB CF-[id number] endoscope was introduced through the anus and advanced to the cecum, which was identified by both the appendix and ileocecal valve.  The quality of the prep was good, using MoviPrep.  The instrument was then slowly withdrawn as the colon was fully examined. <<PROCEDUREIMAGES>>  FINDINGS:  No polyps or cancers were seen (see image1, image2, image3, image4, image5, and image6).   Retroflexed views in the rectum revealed no abnormalities.    The scope was then withdrawn from the patient and the procedure completed.  COMPLICATIONS:  None ENDOSCOPIC IMPRESSION: 1) No polyps or cancers 2) Normal colonoscopy RECOMMENDATIONS: 1) High fiber diet. rectal bleeding likely anal in origin, no significant hemorhoids,  REPEAT EXAM:  In 0 year(s) for.  no trecall dur to age  ______________________________ Hedwig Morton. Juanda Chance, MD  CC:  n. eSIGNED:   Hedwig Morton. Brodie at 08/15/2011 05:05 PM  Crossen, Maisie Fus, [phone]

## 2011-08-15 NOTE — Patient Instructions (Signed)
Normal colonoscopy.

## 2011-08-15 NOTE — Progress Notes (Signed)
Patient did not experience any of the following events: a burn prior to discharge; a fall within the facility; wrong site/side/patient/procedure/implant event; or a hospital transfer or hospital admission upon discharge from the facility. (G8907) Patient did not have preoperative order for IV antibiotic SSI prophylaxis. (G8918)  

## 2011-08-18 ENCOUNTER — Telehealth: Payer: Self-pay | Admitting: *Deleted

## 2011-08-18 NOTE — Telephone Encounter (Signed)

## 2011-08-27 DIAGNOSIS — N3941 Urge incontinence: Secondary | ICD-10-CM | POA: Diagnosis not present

## 2011-08-28 DIAGNOSIS — K219 Gastro-esophageal reflux disease without esophagitis: Secondary | ICD-10-CM | POA: Diagnosis not present

## 2011-08-28 DIAGNOSIS — D649 Anemia, unspecified: Secondary | ICD-10-CM | POA: Diagnosis not present

## 2011-09-03 DIAGNOSIS — H01009 Unspecified blepharitis unspecified eye, unspecified eyelid: Secondary | ICD-10-CM | POA: Diagnosis not present

## 2011-09-17 DIAGNOSIS — N3941 Urge incontinence: Secondary | ICD-10-CM | POA: Diagnosis not present

## 2011-10-08 DIAGNOSIS — N3941 Urge incontinence: Secondary | ICD-10-CM | POA: Diagnosis not present

## 2011-10-29 DIAGNOSIS — N3941 Urge incontinence: Secondary | ICD-10-CM | POA: Diagnosis not present

## 2011-11-19 DIAGNOSIS — N3941 Urge incontinence: Secondary | ICD-10-CM | POA: Diagnosis not present

## 2011-12-10 DIAGNOSIS — N3941 Urge incontinence: Secondary | ICD-10-CM | POA: Diagnosis not present

## 2011-12-25 DIAGNOSIS — E7800 Pure hypercholesterolemia, unspecified: Secondary | ICD-10-CM | POA: Diagnosis not present

## 2011-12-25 DIAGNOSIS — Z79899 Other long term (current) drug therapy: Secondary | ICD-10-CM | POA: Diagnosis not present

## 2011-12-25 DIAGNOSIS — I10 Essential (primary) hypertension: Secondary | ICD-10-CM | POA: Diagnosis not present

## 2011-12-30 DIAGNOSIS — M4000 Postural kyphosis, site unspecified: Secondary | ICD-10-CM | POA: Diagnosis not present

## 2011-12-30 DIAGNOSIS — I10 Essential (primary) hypertension: Secondary | ICD-10-CM | POA: Diagnosis not present

## 2011-12-30 DIAGNOSIS — D649 Anemia, unspecified: Secondary | ICD-10-CM | POA: Diagnosis not present

## 2012-01-02 ENCOUNTER — Other Ambulatory Visit: Payer: Self-pay | Admitting: Dermatology

## 2012-01-02 DIAGNOSIS — L820 Inflamed seborrheic keratosis: Secondary | ICD-10-CM | POA: Diagnosis not present

## 2012-01-07 DIAGNOSIS — N3941 Urge incontinence: Secondary | ICD-10-CM | POA: Diagnosis not present

## 2012-01-30 DIAGNOSIS — N3941 Urge incontinence: Secondary | ICD-10-CM | POA: Diagnosis not present

## 2012-02-18 DIAGNOSIS — N3941 Urge incontinence: Secondary | ICD-10-CM | POA: Diagnosis not present

## 2012-03-08 ENCOUNTER — Other Ambulatory Visit: Payer: Self-pay | Admitting: Dermatology

## 2012-03-08 DIAGNOSIS — L570 Actinic keratosis: Secondary | ICD-10-CM | POA: Diagnosis not present

## 2012-03-08 DIAGNOSIS — C44721 Squamous cell carcinoma of skin of unspecified lower limb, including hip: Secondary | ICD-10-CM | POA: Diagnosis not present

## 2012-03-10 DIAGNOSIS — N3941 Urge incontinence: Secondary | ICD-10-CM | POA: Diagnosis not present

## 2012-03-30 DIAGNOSIS — N3941 Urge incontinence: Secondary | ICD-10-CM | POA: Diagnosis not present

## 2012-04-21 DIAGNOSIS — N3941 Urge incontinence: Secondary | ICD-10-CM | POA: Diagnosis not present

## 2012-05-12 DIAGNOSIS — N3941 Urge incontinence: Secondary | ICD-10-CM | POA: Diagnosis not present

## 2012-05-18 DIAGNOSIS — R197 Diarrhea, unspecified: Secondary | ICD-10-CM | POA: Diagnosis not present

## 2012-05-18 DIAGNOSIS — K5900 Constipation, unspecified: Secondary | ICD-10-CM | POA: Diagnosis not present

## 2012-05-18 DIAGNOSIS — R109 Unspecified abdominal pain: Secondary | ICD-10-CM | POA: Diagnosis not present

## 2012-06-03 DIAGNOSIS — N3941 Urge incontinence: Secondary | ICD-10-CM | POA: Diagnosis not present

## 2012-06-23 DIAGNOSIS — N3941 Urge incontinence: Secondary | ICD-10-CM | POA: Diagnosis not present

## 2012-06-28 DIAGNOSIS — D649 Anemia, unspecified: Secondary | ICD-10-CM | POA: Diagnosis not present

## 2012-06-28 DIAGNOSIS — I10 Essential (primary) hypertension: Secondary | ICD-10-CM | POA: Diagnosis not present

## 2012-07-01 DIAGNOSIS — D649 Anemia, unspecified: Secondary | ICD-10-CM | POA: Diagnosis not present

## 2012-07-01 DIAGNOSIS — I10 Essential (primary) hypertension: Secondary | ICD-10-CM | POA: Diagnosis not present

## 2012-07-01 DIAGNOSIS — M4000 Postural kyphosis, site unspecified: Secondary | ICD-10-CM | POA: Diagnosis not present

## 2012-07-01 DIAGNOSIS — Z0000 Encounter for general adult medical examination without abnormal findings: Secondary | ICD-10-CM | POA: Diagnosis not present

## 2012-07-14 DIAGNOSIS — N3941 Urge incontinence: Secondary | ICD-10-CM | POA: Diagnosis not present

## 2012-08-04 DIAGNOSIS — N3944 Nocturnal enuresis: Secondary | ICD-10-CM | POA: Diagnosis not present

## 2012-08-04 DIAGNOSIS — N3946 Mixed incontinence: Secondary | ICD-10-CM | POA: Diagnosis not present

## 2012-08-25 DIAGNOSIS — N3941 Urge incontinence: Secondary | ICD-10-CM | POA: Diagnosis not present

## 2012-09-09 DIAGNOSIS — Z85828 Personal history of other malignant neoplasm of skin: Secondary | ICD-10-CM | POA: Diagnosis not present

## 2012-09-22 DIAGNOSIS — N3941 Urge incontinence: Secondary | ICD-10-CM | POA: Diagnosis not present

## 2012-10-13 DIAGNOSIS — N3941 Urge incontinence: Secondary | ICD-10-CM | POA: Diagnosis not present

## 2012-11-03 DIAGNOSIS — N3941 Urge incontinence: Secondary | ICD-10-CM | POA: Diagnosis not present

## 2012-11-24 DIAGNOSIS — N3941 Urge incontinence: Secondary | ICD-10-CM | POA: Diagnosis not present

## 2012-12-15 DIAGNOSIS — N3941 Urge incontinence: Secondary | ICD-10-CM | POA: Diagnosis not present

## 2012-12-29 DIAGNOSIS — M949 Disorder of cartilage, unspecified: Secondary | ICD-10-CM | POA: Diagnosis not present

## 2012-12-29 DIAGNOSIS — M899 Disorder of bone, unspecified: Secondary | ICD-10-CM | POA: Diagnosis not present

## 2012-12-29 DIAGNOSIS — M810 Age-related osteoporosis without current pathological fracture: Secondary | ICD-10-CM | POA: Diagnosis not present

## 2012-12-29 DIAGNOSIS — I10 Essential (primary) hypertension: Secondary | ICD-10-CM | POA: Diagnosis not present

## 2012-12-29 LAB — HM DEXA SCAN

## 2012-12-30 DIAGNOSIS — M25469 Effusion, unspecified knee: Secondary | ICD-10-CM | POA: Diagnosis not present

## 2012-12-30 DIAGNOSIS — Z96659 Presence of unspecified artificial knee joint: Secondary | ICD-10-CM | POA: Diagnosis not present

## 2012-12-31 DIAGNOSIS — R29898 Other symptoms and signs involving the musculoskeletal system: Secondary | ICD-10-CM | POA: Diagnosis not present

## 2012-12-31 DIAGNOSIS — M7040 Prepatellar bursitis, unspecified knee: Secondary | ICD-10-CM | POA: Diagnosis not present

## 2013-01-04 DIAGNOSIS — M899 Disorder of bone, unspecified: Secondary | ICD-10-CM | POA: Diagnosis not present

## 2013-01-04 DIAGNOSIS — Z0000 Encounter for general adult medical examination without abnormal findings: Secondary | ICD-10-CM | POA: Diagnosis not present

## 2013-01-04 DIAGNOSIS — M4000 Postural kyphosis, site unspecified: Secondary | ICD-10-CM | POA: Diagnosis not present

## 2013-01-04 DIAGNOSIS — D649 Anemia, unspecified: Secondary | ICD-10-CM | POA: Diagnosis not present

## 2013-01-05 DIAGNOSIS — N3941 Urge incontinence: Secondary | ICD-10-CM | POA: Diagnosis not present

## 2013-02-02 DIAGNOSIS — N3941 Urge incontinence: Secondary | ICD-10-CM | POA: Diagnosis not present

## 2013-03-10 DIAGNOSIS — B029 Zoster without complications: Secondary | ICD-10-CM | POA: Diagnosis not present

## 2013-03-11 DIAGNOSIS — Z85828 Personal history of other malignant neoplasm of skin: Secondary | ICD-10-CM | POA: Diagnosis not present

## 2013-03-11 DIAGNOSIS — L819 Disorder of pigmentation, unspecified: Secondary | ICD-10-CM | POA: Diagnosis not present

## 2013-03-11 DIAGNOSIS — L570 Actinic keratosis: Secondary | ICD-10-CM | POA: Diagnosis not present

## 2013-03-11 DIAGNOSIS — L820 Inflamed seborrheic keratosis: Secondary | ICD-10-CM | POA: Diagnosis not present

## 2013-07-07 DIAGNOSIS — I10 Essential (primary) hypertension: Secondary | ICD-10-CM | POA: Diagnosis not present

## 2013-07-07 DIAGNOSIS — Z125 Encounter for screening for malignant neoplasm of prostate: Secondary | ICD-10-CM | POA: Diagnosis not present

## 2013-07-07 DIAGNOSIS — D649 Anemia, unspecified: Secondary | ICD-10-CM | POA: Diagnosis not present

## 2013-07-07 DIAGNOSIS — Z0000 Encounter for general adult medical examination without abnormal findings: Secondary | ICD-10-CM | POA: Diagnosis not present

## 2013-07-19 DIAGNOSIS — Z0000 Encounter for general adult medical examination without abnormal findings: Secondary | ICD-10-CM | POA: Diagnosis not present

## 2013-07-19 DIAGNOSIS — M4000 Postural kyphosis, site unspecified: Secondary | ICD-10-CM | POA: Diagnosis not present

## 2013-07-19 DIAGNOSIS — Z1331 Encounter for screening for depression: Secondary | ICD-10-CM | POA: Diagnosis not present

## 2013-07-19 DIAGNOSIS — M159 Polyosteoarthritis, unspecified: Secondary | ICD-10-CM | POA: Diagnosis not present

## 2013-07-19 DIAGNOSIS — M810 Age-related osteoporosis without current pathological fracture: Secondary | ICD-10-CM | POA: Diagnosis not present

## 2013-09-09 DIAGNOSIS — Z85828 Personal history of other malignant neoplasm of skin: Secondary | ICD-10-CM | POA: Diagnosis not present

## 2013-09-09 DIAGNOSIS — L570 Actinic keratosis: Secondary | ICD-10-CM | POA: Diagnosis not present

## 2013-09-09 DIAGNOSIS — L821 Other seborrheic keratosis: Secondary | ICD-10-CM | POA: Diagnosis not present

## 2013-09-09 DIAGNOSIS — L819 Disorder of pigmentation, unspecified: Secondary | ICD-10-CM | POA: Diagnosis not present

## 2013-09-09 DIAGNOSIS — D1801 Hemangioma of skin and subcutaneous tissue: Secondary | ICD-10-CM | POA: Diagnosis not present

## 2014-01-10 DIAGNOSIS — H269 Unspecified cataract: Secondary | ICD-10-CM | POA: Diagnosis not present

## 2014-01-26 DIAGNOSIS — E7800 Pure hypercholesterolemia, unspecified: Secondary | ICD-10-CM | POA: Diagnosis not present

## 2014-01-26 DIAGNOSIS — I10 Essential (primary) hypertension: Secondary | ICD-10-CM | POA: Diagnosis not present

## 2014-01-30 DIAGNOSIS — J449 Chronic obstructive pulmonary disease, unspecified: Secondary | ICD-10-CM | POA: Diagnosis not present

## 2014-01-30 DIAGNOSIS — R634 Abnormal weight loss: Secondary | ICD-10-CM | POA: Diagnosis not present

## 2014-01-30 DIAGNOSIS — D649 Anemia, unspecified: Secondary | ICD-10-CM | POA: Diagnosis not present

## 2014-01-30 DIAGNOSIS — N183 Chronic kidney disease, stage 3 unspecified: Secondary | ICD-10-CM | POA: Diagnosis not present

## 2014-01-30 DIAGNOSIS — I10 Essential (primary) hypertension: Secondary | ICD-10-CM | POA: Diagnosis not present

## 2014-02-13 DIAGNOSIS — R634 Abnormal weight loss: Secondary | ICD-10-CM | POA: Diagnosis not present

## 2014-02-13 DIAGNOSIS — I10 Essential (primary) hypertension: Secondary | ICD-10-CM | POA: Diagnosis not present

## 2014-02-16 DIAGNOSIS — N183 Chronic kidney disease, stage 3 unspecified: Secondary | ICD-10-CM | POA: Diagnosis not present

## 2014-02-16 DIAGNOSIS — I10 Essential (primary) hypertension: Secondary | ICD-10-CM | POA: Diagnosis not present

## 2014-02-26 ENCOUNTER — Emergency Department (HOSPITAL_COMMUNITY): Payer: Medicare Other

## 2014-02-26 ENCOUNTER — Encounter (HOSPITAL_COMMUNITY): Payer: Self-pay | Admitting: Emergency Medicine

## 2014-02-26 ENCOUNTER — Inpatient Hospital Stay (HOSPITAL_COMMUNITY)
Admission: EM | Admit: 2014-02-26 | Discharge: 2014-03-02 | DRG: 683 | Disposition: A | Discharge status: Skilled Nursing Facility | Payer: Medicare Other | Attending: Internal Medicine | Admitting: Internal Medicine

## 2014-02-26 DIAGNOSIS — R079 Chest pain, unspecified: Secondary | ICD-10-CM | POA: Diagnosis not present

## 2014-02-26 DIAGNOSIS — R7989 Other specified abnormal findings of blood chemistry: Secondary | ICD-10-CM | POA: Diagnosis not present

## 2014-02-26 DIAGNOSIS — E440 Moderate protein-calorie malnutrition: Secondary | ICD-10-CM | POA: Diagnosis present

## 2014-02-26 DIAGNOSIS — T148XXA Other injury of unspecified body region, initial encounter: Secondary | ICD-10-CM | POA: Diagnosis not present

## 2014-02-26 DIAGNOSIS — M545 Low back pain, unspecified: Secondary | ICD-10-CM | POA: Diagnosis not present

## 2014-02-26 DIAGNOSIS — Z96659 Presence of unspecified artificial knee joint: Secondary | ICD-10-CM

## 2014-02-26 DIAGNOSIS — L089 Local infection of the skin and subcutaneous tissue, unspecified: Secondary | ICD-10-CM | POA: Diagnosis not present

## 2014-02-26 DIAGNOSIS — Z6825 Body mass index (BMI) 25.0-25.9, adult: Secondary | ICD-10-CM

## 2014-02-26 DIAGNOSIS — R609 Edema, unspecified: Secondary | ICD-10-CM | POA: Diagnosis present

## 2014-02-26 DIAGNOSIS — M542 Cervicalgia: Secondary | ICD-10-CM | POA: Diagnosis not present

## 2014-02-26 DIAGNOSIS — M51379 Other intervertebral disc degeneration, lumbosacral region without mention of lumbar back pain or lower extremity pain: Secondary | ICD-10-CM | POA: Diagnosis present

## 2014-02-26 DIAGNOSIS — M5137 Other intervertebral disc degeneration, lumbosacral region: Secondary | ICD-10-CM | POA: Diagnosis present

## 2014-02-26 DIAGNOSIS — G8929 Other chronic pain: Secondary | ICD-10-CM | POA: Diagnosis present

## 2014-02-26 DIAGNOSIS — R55 Syncope and collapse: Secondary | ICD-10-CM | POA: Diagnosis not present

## 2014-02-26 DIAGNOSIS — N179 Acute kidney failure, unspecified: Secondary | ICD-10-CM | POA: Diagnosis not present

## 2014-02-26 DIAGNOSIS — R531 Weakness: Secondary | ICD-10-CM | POA: Diagnosis present

## 2014-02-26 DIAGNOSIS — D649 Anemia, unspecified: Secondary | ICD-10-CM | POA: Diagnosis present

## 2014-02-26 DIAGNOSIS — Z7289 Other problems related to lifestyle: Secondary | ICD-10-CM | POA: Diagnosis present

## 2014-02-26 DIAGNOSIS — IMO0002 Reserved for concepts with insufficient information to code with codable children: Secondary | ICD-10-CM | POA: Diagnosis not present

## 2014-02-26 DIAGNOSIS — M519 Unspecified thoracic, thoracolumbar and lumbosacral intervertebral disc disorder: Secondary | ICD-10-CM | POA: Diagnosis present

## 2014-02-26 DIAGNOSIS — R778 Other specified abnormalities of plasma proteins: Secondary | ICD-10-CM | POA: Diagnosis present

## 2014-02-26 DIAGNOSIS — I10 Essential (primary) hypertension: Secondary | ICD-10-CM | POA: Diagnosis not present

## 2014-02-26 DIAGNOSIS — M6282 Rhabdomyolysis: Secondary | ICD-10-CM | POA: Diagnosis present

## 2014-02-26 DIAGNOSIS — K219 Gastro-esophageal reflux disease without esophagitis: Secondary | ICD-10-CM | POA: Diagnosis present

## 2014-02-26 DIAGNOSIS — F1010 Alcohol abuse, uncomplicated: Secondary | ICD-10-CM | POA: Diagnosis present

## 2014-02-26 DIAGNOSIS — R945 Abnormal results of liver function studies: Secondary | ICD-10-CM

## 2014-02-26 DIAGNOSIS — Z789 Other specified health status: Secondary | ICD-10-CM | POA: Diagnosis not present

## 2014-02-26 DIAGNOSIS — R51 Headache: Secondary | ICD-10-CM | POA: Diagnosis not present

## 2014-02-26 DIAGNOSIS — M25559 Pain in unspecified hip: Secondary | ICD-10-CM | POA: Diagnosis not present

## 2014-02-26 DIAGNOSIS — T796XXA Traumatic ischemia of muscle, initial encounter: Secondary | ICD-10-CM

## 2014-02-26 DIAGNOSIS — E860 Dehydration: Secondary | ICD-10-CM | POA: Diagnosis present

## 2014-02-26 DIAGNOSIS — S0990XA Unspecified injury of head, initial encounter: Secondary | ICD-10-CM | POA: Diagnosis not present

## 2014-02-26 DIAGNOSIS — Z87891 Personal history of nicotine dependence: Secondary | ICD-10-CM

## 2014-02-26 DIAGNOSIS — S0993XA Unspecified injury of face, initial encounter: Secondary | ICD-10-CM | POA: Diagnosis not present

## 2014-02-26 LAB — COMPREHENSIVE METABOLIC PANEL
ALBUMIN: 3.6 g/dL (ref 3.5–5.2)
ALT: 69 U/L — ABNORMAL HIGH (ref 0–53)
AST: 215 U/L — ABNORMAL HIGH (ref 0–37)
Alkaline Phosphatase: 52 U/L (ref 39–117)
Anion gap: 19 — ABNORMAL HIGH (ref 5–15)
BILIRUBIN TOTAL: 1.7 mg/dL — AB (ref 0.3–1.2)
BUN: 39 mg/dL — ABNORMAL HIGH (ref 6–23)
CHLORIDE: 99 meq/L (ref 96–112)
CO2: 22 mEq/L (ref 19–32)
CREATININE: 1.6 mg/dL — AB (ref 0.50–1.35)
Calcium: 11.6 mg/dL — ABNORMAL HIGH (ref 8.4–10.5)
GFR calc Af Amer: 43 mL/min — ABNORMAL LOW (ref >90)
GFR calc non Af Amer: 37 mL/min — ABNORMAL LOW (ref >90)
Glucose, Bld: 102 mg/dL — ABNORMAL HIGH (ref 70–99)
Potassium: 3.9 mEq/L (ref 3.7–5.3)
Sodium: 140 mEq/L (ref 137–147)
TOTAL PROTEIN: 7.1 g/dL (ref 6.0–8.3)

## 2014-02-26 LAB — I-STAT TROPONIN, ED: Troponin i, poc: 0.56 ng/mL (ref 0.00–0.08)

## 2014-02-26 LAB — CBC
HEMATOCRIT: 38 % — AB (ref 39.0–52.0)
Hemoglobin: 13.2 g/dL (ref 13.0–17.0)
MCH: 32.2 pg (ref 26.0–34.0)
MCHC: 34.7 g/dL (ref 30.0–36.0)
MCV: 92.7 fL (ref 78.0–100.0)
PLATELETS: 238 10*3/uL (ref 150–400)
RBC: 4.1 MIL/uL — ABNORMAL LOW (ref 4.22–5.81)
RDW: 13.5 % (ref 11.5–15.5)
WBC: 12 10*3/uL — AB (ref 4.0–10.5)

## 2014-02-26 LAB — TROPONIN I: Troponin I: 1.47 ng/mL (ref <0.30)

## 2014-02-26 LAB — CK TOTAL AND CKMB (NOT AT ARMC)
CK, MB: 93.3 ng/mL (ref 0.3–4.0)
RELATIVE INDEX: 1 (ref 0.0–2.5)
Total CK: 9255 U/L — ABNORMAL HIGH (ref 7–232)

## 2014-02-26 LAB — CK: CK TOTAL: 9829 U/L — AB (ref 7–232)

## 2014-02-26 MED ORDER — SODIUM CHLORIDE 0.9 % IV BOLUS (SEPSIS)
1000.0000 mL | Freq: Once | INTRAVENOUS | Status: AC
  Administered 2014-02-26: 1000 mL via INTRAVENOUS

## 2014-02-26 MED ORDER — ASPIRIN 300 MG RE SUPP
300.0000 mg | Freq: Once | RECTAL | Status: AC
  Administered 2014-02-26: 300 mg via RECTAL
  Filled 2014-02-26: qty 1

## 2014-02-26 MED ORDER — ASPIRIN 325 MG PO TABS
325.0000 mg | ORAL_TABLET | Freq: Once | ORAL | Status: DC
Start: 2014-02-26 — End: 2014-02-26
  Filled 2014-02-26: qty 1

## 2014-02-26 MED ORDER — HEPARIN (PORCINE) IN NACL 100-0.45 UNIT/ML-% IJ SOLN
1050.0000 [IU]/h | INTRAMUSCULAR | Status: DC
  Administered 2014-02-27: 900 [IU]/h via INTRAVENOUS
  Administered 2014-02-27: 1050 [IU]/h via INTRAVENOUS
  Filled 2014-02-26: qty 250

## 2014-02-26 MED ORDER — HEPARIN BOLUS VIA INFUSION
3000.0000 [IU] | Freq: Once | INTRAVENOUS | Status: AC
  Administered 2014-02-27: 3000 [IU] via INTRAVENOUS
  Filled 2014-02-26: qty 3000

## 2014-02-26 NOTE — ED Notes (Signed)
Per EMS, pt coming from [facility] today. Pt was asleep last night and was having chronic back pain so he llaid on the floor. Pt was unable to get off the floor today. Pt reports being on floor x 17hours. Pt has bruising to right eye and scrapes to bilateral knees. NAD at this time. Pt reports right hip pain at this time which is chronic.

## 2014-02-26 NOTE — H&P (Signed)
Patient's PCP: Jani Gravel, MD  Chief Complaint: Fall  History of Present Illness: [name] is a 80 y.o. Caucasian male with history of hypertension reports that he is not currently on any antihypertensive medications, GERD, arthritis, chronic back pain uses over-the-counter patches, and osteoporosis who presents with the above complaints.  Patient reported that around 11 p.m. last night he had back pain so he laid on the floor, as it improves his back pain.  Around 2 a.m. this morning he woke up because it was cold, he called for a period time but was eventually able to get up and went to bed.  At 7 a.m. this morning when he woke up he was weak and could not get up as a result rolled back on to the floor.  He denies losing consciousness.  He was not able to get up.  He lives currently at [facility] facility, it was noted that he was missing as a result sent people investigate.  They found him on the floor around 6 p.m. as a result he was brought to emergency department for further evaluation.  In the emergency department he was found to be in acute renal failure with a creatinine of 1.6 and he was found to have elevated CK of 9829 suggestive of rhabdomyolysis.  As a result hospitalist service was asked to admit the patient for further care and management.  He denies any recent fevers, chills, nausea, vomiting, chest pain, shortness of breath, abdominal pain, diarrhea, headaches or vision changes.  Currently he denies any back pain at this time.  Review of Systems: All systems reviewed with the patient and positive as per history of present illness, otherwise all other systems are negative.  Past Medical History  Diagnosis Date  . Hypertension   . GERD (gastroesophageal reflux disease)   . Hiatal hernia   . Arthritis    Past Surgical History  Procedure Laterality Date  . Replacement total knee bilateral    . Back surgery    . Inguinal hernia repair    . Colonoscopy     Family History   Problem Relation Age of Onset  . Colon cancer Neg Hx   . Alcoholism Father    History   Social History  . Marital Status: Divorced    Spouse Name: N/A    Number of Children: 2  . Years of Education: N/A   Occupational History  . retired    Social History Main Topics  . Smoking status: Former Research scientist (life sciences)  . Smokeless tobacco: Never Used  . Alcohol Use: 7.0 oz/week    14 drink(s) per week     Comment: couple of drinks of rum every day  . Drug Use: No  . Sexual Activity: Not on file   Other Topics Concern  . Not on file   Social History Narrative  . No narrative on file   Allergies: Review of patient's allergies indicates no known allergies.  Home Meds: Prior to Admission medications   Medication Sig Start Date End Date Taking? Authorizing Provider  dexlansoprazole (DEXILANT) 60 MG capsule Take 60 mg by mouth daily.     Yes Historical Provider, MD  lisinopril (PRINIVIL,ZESTRIL) 5 MG tablet Take 5 mg by mouth daily.  07/11/11  Yes Historical Provider, MD  Probiotic Product (ALIGN PO) Take 1 tablet by mouth daily.     Yes Historical Provider, MD    Physical Exam: Blood pressure 153/88, pulse 72, temperature 97.9 F (36.6 C), temperature source Oral, resp.  rate 15, SpO2 100.00%. General: Awake, Oriented x3, No acute distress. HEENT: EOMI, Moist mucous membranes, bruising around right eye with dried blood. Neck: Supple CV: S1 and S2 Lungs: Clear to ascultation bilaterally Abdomen: Soft, Nontender, Nondistended, +bowel sounds. Ext: Good pulses. Trace edema. No clubbing or cyanosis noted. Neuro: Cranial Nerves II-XII grossly intact. Has 5/5 motor strength in upper and lower extremities.  Lab results:  Recent Labs  02/26/14 2040  NA 140  K 3.9  CL 99  CO2 22  GLUCOSE 102*  BUN 39*  CREATININE 1.60*  CALCIUM 11.6*    Recent Labs  02/26/14 2040  AST 215*  ALT 69*  ALKPHOS 52  BILITOT 1.7*  PROT 7.1  ALBUMIN 3.6   No results found for this basename:  LIPASE, AMYLASE,  in the last 72 hours  Recent Labs  02/26/14 2040  WBC 12.0*  HGB 13.2  HCT 38.0*  MCV 92.7  PLT 238    Recent Labs  02/26/14 2040  CKTOTAL 9829*   No components found with this basename: POCBNP,  No results found for this basename: DDIMER,  in the last 72 hours No results found for this basename: HGBA1C,  in the last 72 hours No results found for this basename: CHOL, HDL, LDLCALC, TRIG, CHOLHDL, LDLDIRECT,  in the last 72 hours No results found for this basename: TSH, T4TOTAL, FREET3, T3FREE, THYROIDAB,  in the last 72 hours No results found for this basename: VITAMINB12, FOLATE, FERRITIN, TIBC, IRON, RETICCTPCT,  in the last 72 hours Imaging results:  Dg Chest 1 View  02/26/2014   CLINICAL DATA:  Patient found down.  EXAM: CHEST - 1 VIEW  COMPARISON:  PA and lateral chest 01/30/2014.  FINDINGS: Calcified granuloma left lower lobe again seen. The lungs are otherwise clear. Heart size is normal. No pneumothorax or pleural effusion.  IMPRESSION: No acute finding.   Electronically Signed   By: Inge Rise M.D.   On: 02/26/2014 21:35   Dg Lumbar Spine Complete  02/26/2014   CLINICAL DATA:  Fall.  EXAM: LUMBAR SPINE - COMPLETE 4+ VIEW  COMPARISON:  10/23/2008  FINDINGS: There is severe lumbar dextroscoliosis with advanced degenerative disc and facet disease. Bulky osteophytes present along the concavity of the scoliotic curve. These changes have progressed from 2010, and limit the sensitivity of radiography for detecting acute fracture. There is chronic grade 1 anterolisthesis at L5-S1, associated with bilateral pars interarticularis defects. There is no definitive fracture or traumatic subluxation.  IMPRESSION: 1. No acute fracture identified. 2. Dextroscoliosis and advanced lumbar degenerative changes.   Electronically Signed   By: Jorje Guild M.D.   On: 02/26/2014 21:39   Dg Hip Complete Right  02/26/2014   CLINICAL DATA:  Patient found down.  Right hip pain.   EXAM: RIGHT HIP - COMPLETE 2+ VIEW  COMPARISON:  None.  FINDINGS: Both hips are located. Fracture is identified. Lower lumbar degenerative change in convex right scoliosis are partially visualized.  IMPRESSION: No acute abnormality.   Electronically Signed   By: Inge Rise M.D.   On: 02/26/2014 21:40   Ct Head Wo Contrast  02/26/2014   CLINICAL DATA:  Pain post trauma  EXAM: CT HEAD WITHOUT CONTRAST  CT CERVICAL SPINE WITHOUT CONTRAST  TECHNIQUE: Multidetector CT imaging of the head and cervical spine was performed following the standard protocol without intravenous contrast. Multiplanar CT image reconstructions of the cervical spine were also generated.  COMPARISON:  None.  FINDINGS: CT HEAD FINDINGS  There is  moderate diffuse atrophy. There is no appreciable mass, hemorrhage, subdural or epidural hematomas, or midline shift. There is mild patchy small vessel disease in the centra semiovale bilaterally. No acute appearing infarct is seen the bony calvarium appears intact. The mastoid air cells are clear. There are retention cysts in each maxillary antrum. There is a small retention cyst in the left sphenoid sinus. There is mucosal thickening and multiple ethmoid air cells bilaterally. There is mild inferior left frontal sinus disease. There is leftward deviation of the nasal septum.  CT CERVICAL SPINE FINDINGS:  There is no fracture or spondylolisthesis. Prevertebral soft tissues and predental space regions are normal. There is levoscoliosis.  There is mild disc space narrowing at C4-5 and C5-6. There is facet hypertrophy at most levels bilaterally. There is no frank disc extrusion or stenosis. There is calcification in each carotid artery, more on the left than on the right. There is bilateral apical pleural thickening and scarring.  IMPRESSION: CT head: Atrophy with mild periventricular small vessel disease. Paranasal sinus disease. Study otherwise unremarkable.  CT cervical spine: Multifocal  osteoarthritic change. Scoliosis. No fracture or spondylolisthesis. Calcification in each carotid artery, more on the left than on the right.   Electronically Signed   By: Lowella Grip M.D.   On: 02/26/2014 21:25   Ct Cervical Spine Wo Contrast  02/26/2014   CLINICAL DATA:  Pain post trauma  EXAM: CT HEAD WITHOUT CONTRAST  CT CERVICAL SPINE WITHOUT CONTRAST  TECHNIQUE: Multidetector CT imaging of the head and cervical spine was performed following the standard protocol without intravenous contrast. Multiplanar CT image reconstructions of the cervical spine were also generated.  COMPARISON:  None.  FINDINGS: CT HEAD FINDINGS  There is moderate diffuse atrophy. There is no appreciable mass, hemorrhage, subdural or epidural hematomas, or midline shift. There is mild patchy small vessel disease in the centra semiovale bilaterally. No acute appearing infarct is seen the bony calvarium appears intact. The mastoid air cells are clear. There are retention cysts in each maxillary antrum. There is a small retention cyst in the left sphenoid sinus. There is mucosal thickening and multiple ethmoid air cells bilaterally. There is mild inferior left frontal sinus disease. There is leftward deviation of the nasal septum.  CT CERVICAL SPINE FINDINGS:  There is no fracture or spondylolisthesis. Prevertebral soft tissues and predental space regions are normal. There is levoscoliosis.  There is mild disc space narrowing at C4-5 and C5-6. There is facet hypertrophy at most levels bilaterally. There is no frank disc extrusion or stenosis. There is calcification in each carotid artery, more on the left than on the right. There is bilateral apical pleural thickening and scarring.  IMPRESSION: CT head: Atrophy with mild periventricular small vessel disease. Paranasal sinus disease. Study otherwise unremarkable.  CT cervical spine: Multifocal osteoarthritic change. Scoliosis. No fracture or spondylolisthesis. Calcification in each  carotid artery, more on the left than on the right.   Electronically Signed   By: Lowella Grip M.D.   On: 02/26/2014 21:25   Other results: EKG: Sinus rhythm with PACs.  Assessment & Plan by Problem: Acute renal failure Likely due to dehydration from poor oral intake and from rhabdomyolysis.  Hydrate the patient on IV fluids.  Reassess renal function in the morning.  Rhabdomyolysis Likely due to fall and being on the floor for extended period of time.  Continue IV hydration.  Check CK again in the morning to make sure the renal function continues to trend in the right  direction.  Elevated troponin EKG shows no signs of ischemia.  Continue aspirin which was started in the emergency department.  Suspect elevated troponin is likely related to rhabdomyolysis.  CK-MB also elevated, again likely from admission.  Started the patient on heparin as there is concern for possible NSTEMI.  Cardiology will evaluate the patient in the morning. Case was discussed with Dr. Silvio Clayman with Dr. Radford Pax, cardiology.  Acute on chronic low back pain/fall Request PT and OT evaluation the morning.  Elevated liver function tests Likely due to daily alcohol use.  Continue to monitor.  Alcohol use Started the patient on CIWA protocol.  Thiamine and folic acid.  Generalized weakness PT and OT evaluation as indicated above.  GERD  Stable.  Hypertension Patient indicates that he no longer takes any antihypertensive medications at this time.  Stable at this time.  Continue to monitor.  Prophylaxis Lovenox.  CODE STATUS Full code.  This was discussed with the patient at the time of admission.  Disposition Admit the patient to telemetry as inpatient.  Time spent on admission, talking to the patient, and coordinating care was: 50 mins.  Kahli Mayon A, MD 02/26/2014, 10:37 PM

## 2014-02-26 NOTE — ED Provider Notes (Signed)
CSN: [id number]     Arrival date & time 02/26/14  2012 History   First MD Initiated Contact with Patient 02/26/14 2015     Chief Complaint  Patient presents with  . Fall     (Consider location/radiation/quality/duration/timing/severity/associated sxs/prior Treatment) HPI Patient presents after a fall at home, lives at [facility] assisted living. Had back pain, and went to his back. Last night laid on the floor 2/2 back pain. Had pain again this morning, stayed on the floor all day until found by SNF worker. Presents for continued back pain and concerns for fall.    Past Medical History  Diagnosis Date  . Hypertension   . GERD (gastroesophageal reflux disease)   . Hiatal hernia   . Arthritis    Past Surgical History  Procedure Laterality Date  . Replacement total knee bilateral    . Back surgery    . Inguinal hernia repair    . Colonoscopy     Family History  Problem Relation Age of Onset  . Colon cancer Neg Hx   . Alcoholism Father    History  Substance Use Topics  . Smoking status: Former Research scientist (life sciences)  . Smokeless tobacco: Never Used  . Alcohol Use: 7.0 oz/week    14 drink(s) per week     Comment: couple of drinks of rum every day    Review of Systems  Constitutional: Negative for fever and chills.  HENT: Negative for sore throat.   Eyes: Negative for pain.  Respiratory: Negative for cough and shortness of breath.   Cardiovascular: Negative for chest pain.  Gastrointestinal: Negative for nausea, vomiting and abdominal pain.  Genitourinary: Negative for dysuria and flank pain.  Musculoskeletal: Positive for back pain. Negative for neck pain.  Skin: Negative for rash.  Neurological: Negative for seizures and headaches.      Allergies  Review of patient's allergies indicates no known allergies.  Home Medications   Prior to Admission medications   Medication Sig Start Date End Date Taking? Authorizing Provider  dexlansoprazole (DEXILANT) 60 MG capsule Take 60  mg by mouth daily.     Yes Historical Provider, MD  lisinopril (PRINIVIL,ZESTRIL) 5 MG tablet Take 5 mg by mouth daily.  07/11/11  Yes Historical Provider, MD  Probiotic Product (ALIGN PO) Take 1 tablet by mouth daily.     Yes Historical Provider, MD   BP 122/74  Pulse 61  Temp(Src) 97.4 F (36.3 C) (Oral)  Resp 18  Ht 5\' 4"  (1.626 m)  Wt 145 lb 14.4 oz (66.18 kg)  BMI 25.03 kg/m2  SpO2 100% Physical Exam  Constitutional: He is oriented to person, place, and time. He appears cachectic. He appears ill. No distress.  HENT:  Head: Normocephalic and atraumatic.  Eyes: Pupils are equal, round, and reactive to light.  Neck: Decreased range of motion present.  Cardiovascular: Normal rate and regular rhythm.   Pulmonary/Chest: Effort normal and breath sounds normal.  Abdominal: Soft. He exhibits no distension. There is no tenderness.  Neurological: He is alert and oriented to person, place, and time.  Skin: Skin is warm. He is not diaphoretic.    ED Course  Procedures (including critical care time) Labs Review Labs Reviewed  CBC - Abnormal; Notable for the following:    WBC 12.0 (*)    RBC 4.10 (*)    HCT 38.0 (*)    All other components within normal limits  COMPREHENSIVE METABOLIC PANEL - Abnormal; Notable for the following:    Glucose, Bld  102 (*)    BUN 39 (*)    Creatinine, Ser 1.60 (*)    Calcium 11.6 (*)    AST 215 (*)    ALT 69 (*)    Total Bilirubin 1.7 (*)    GFR calc non Af Amer 37 (*)    GFR calc Af Amer 43 (*)    Anion gap 19 (*)    All other components within normal limits  CK - Abnormal; Notable for the following:    Total CK 9829 (*)    All other components within normal limits  TROPONIN I - Abnormal; Notable for the following:    Troponin I 1.47 (*)    All other components within normal limits  CK TOTAL AND CKMB - Abnormal; Notable for the following:    Total CK 9255 (*)    CK, MB 93.3 (*)    All other components within normal limits  TROPONIN I -  Abnormal; Notable for the following:    Troponin I 1.01 (*)    All other components within normal limits  HEPARIN LEVEL (UNFRACTIONATED) - Abnormal; Notable for the following:    Heparin Unfractionated 0.24 (*)    All other components within normal limits  CBC - Abnormal; Notable for the following:    WBC 11.2 (*)    RBC 3.51 (*)    Hemoglobin 11.2 (*)    HCT 32.7 (*)    All other components within normal limits  COMPREHENSIVE METABOLIC PANEL - Abnormal; Notable for the following:    Potassium 3.6 (*)    BUN 39 (*)    Creatinine, Ser 1.54 (*)    Total Protein 5.9 (*)    Albumin 2.9 (*)    AST 147 (*)    ALT 58 (*)    GFR calc non Af Amer 39 (*)    GFR calc Af Amer 45 (*)    Anion gap 16 (*)    All other components within normal limits  TROPONIN I - Abnormal; Notable for the following:    Troponin I 1.81 (*)    All other components within normal limits  TROPONIN I - Abnormal; Notable for the following:    Troponin I 1.66 (*)    All other components within normal limits  CK TOTAL AND CKMB - Abnormal; Notable for the following:    Total CK 5521 (*)    CK, MB 52.4 (*)    All other components within normal limits  CK TOTAL AND CKMB - Abnormal; Notable for the following:    Total CK 4148 (*)    CK, MB 37.7 (*)    All other components within normal limits  I-STAT TROPOININ, ED - Abnormal; Notable for the following:    Troponin i, poc 0.56 (*)    All other components within normal limits  MRSA PCR SCREENING  URINALYSIS, ROUTINE W REFLEX MICROSCOPIC  CBC  BASIC METABOLIC PANEL  CK    Imaging Review Dg Chest 1 View  02/26/2014   CLINICAL DATA:  Patient found down.  EXAM: CHEST - 1 VIEW  COMPARISON:  PA and lateral chest 01/30/2014.  FINDINGS: Calcified granuloma left lower lobe again seen. The lungs are otherwise clear. Heart size is normal. No pneumothorax or pleural effusion.  IMPRESSION: No acute finding.   Electronically Signed   By: Inge Rise M.D.   On: 02/26/2014  21:35   Dg Lumbar Spine Complete  02/26/2014   CLINICAL DATA:  Fall.  EXAM: LUMBAR SPINE -  COMPLETE 4+ VIEW  COMPARISON:  10/23/2008  FINDINGS: There is severe lumbar dextroscoliosis with advanced degenerative disc and facet disease. Bulky osteophytes present along the concavity of the scoliotic curve. These changes have progressed from 2010, and limit the sensitivity of radiography for detecting acute fracture. There is chronic grade 1 anterolisthesis at L5-S1, associated with bilateral pars interarticularis defects. There is no definitive fracture or traumatic subluxation.  IMPRESSION: 1. No acute fracture identified. 2. Dextroscoliosis and advanced lumbar degenerative changes.   Electronically Signed   By: Jorje Guild M.D.   On: 02/26/2014 21:39   Dg Hip Complete Right  02/26/2014   CLINICAL DATA:  Patient found down.  Right hip pain.  EXAM: RIGHT HIP - COMPLETE 2+ VIEW  COMPARISON:  None.  FINDINGS: Both hips are located. Fracture is identified. Lower lumbar degenerative change in convex right scoliosis are partially visualized.  IMPRESSION: No acute abnormality.   Electronically Signed   By: Inge Rise M.D.   On: 02/26/2014 21:40   Ct Head Wo Contrast  02/26/2014   CLINICAL DATA:  Pain post trauma  EXAM: CT HEAD WITHOUT CONTRAST  CT CERVICAL SPINE WITHOUT CONTRAST  TECHNIQUE: Multidetector CT imaging of the head and cervical spine was performed following the standard protocol without intravenous contrast. Multiplanar CT image reconstructions of the cervical spine were also generated.  COMPARISON:  None.  FINDINGS: CT HEAD FINDINGS  There is moderate diffuse atrophy. There is no appreciable mass, hemorrhage, subdural or epidural hematomas, or midline shift. There is mild patchy small vessel disease in the centra semiovale bilaterally. No acute appearing infarct is seen the bony calvarium appears intact. The mastoid air cells are clear. There are retention cysts in each maxillary antrum. There  is a small retention cyst in the left sphenoid sinus. There is mucosal thickening and multiple ethmoid air cells bilaterally. There is mild inferior left frontal sinus disease. There is leftward deviation of the nasal septum.  CT CERVICAL SPINE FINDINGS:  There is no fracture or spondylolisthesis. Prevertebral soft tissues and predental space regions are normal. There is levoscoliosis.  There is mild disc space narrowing at C4-5 and C5-6. There is facet hypertrophy at most levels bilaterally. There is no frank disc extrusion or stenosis. There is calcification in each carotid artery, more on the left than on the right. There is bilateral apical pleural thickening and scarring.  IMPRESSION: CT head: Atrophy with mild periventricular small vessel disease. Paranasal sinus disease. Study otherwise unremarkable.  CT cervical spine: Multifocal osteoarthritic change. Scoliosis. No fracture or spondylolisthesis. Calcification in each carotid artery, more on the left than on the right.   Electronically Signed   By: Lowella Grip M.D.   On: 02/26/2014 21:25   Ct Cervical Spine Wo Contrast  02/26/2014   CLINICAL DATA:  Pain post trauma  EXAM: CT HEAD WITHOUT CONTRAST  CT CERVICAL SPINE WITHOUT CONTRAST  TECHNIQUE: Multidetector CT imaging of the head and cervical spine was performed following the standard protocol without intravenous contrast. Multiplanar CT image reconstructions of the cervical spine were also generated.  COMPARISON:  None.  FINDINGS: CT HEAD FINDINGS  There is moderate diffuse atrophy. There is no appreciable mass, hemorrhage, subdural or epidural hematomas, or midline shift. There is mild patchy small vessel disease in the centra semiovale bilaterally. No acute appearing infarct is seen the bony calvarium appears intact. The mastoid air cells are clear. There are retention cysts in each maxillary antrum. There is a small retention cyst in the left  sphenoid sinus. There is mucosal thickening and  multiple ethmoid air cells bilaterally. There is mild inferior left frontal sinus disease. There is leftward deviation of the nasal septum.  CT CERVICAL SPINE FINDINGS:  There is no fracture or spondylolisthesis. Prevertebral soft tissues and predental space regions are normal. There is levoscoliosis.  There is mild disc space narrowing at C4-5 and C5-6. There is facet hypertrophy at most levels bilaterally. There is no frank disc extrusion or stenosis. There is calcification in each carotid artery, more on the left than on the right. There is bilateral apical pleural thickening and scarring.  IMPRESSION: CT head: Atrophy with mild periventricular small vessel disease. Paranasal sinus disease. Study otherwise unremarkable.  CT cervical spine: Multifocal osteoarthritic change. Scoliosis. No fracture or spondylolisthesis. Calcification in each carotid artery, more on the left than on the right.   Electronically Signed   By: Lowella Grip M.D.   On: 02/26/2014 21:25     EKG Interpretation   Date/Time:  Sunday February 26 2014 21:50:06 EDT Ventricular Rate:  64 PR Interval:  156 QRS Duration: 104 QT Interval:  421 QTC Calculation: 434 R Axis:   61 Text Interpretation:  Sinus rhythm Atrial premature complexes Baseline  wander in lead(s) I aVL No significant change since last tracing Confirmed  by YAO  MD, DAVID ([id number]) on 02/26/2014 9:55:09 PM      MDM   Final diagnoses:  AKI (acute kidney injury)   80 yo M with hx of HTN, GERD presents with fall.   Given fall, will obtain CT head. EKG with no acute findings. CT head without evidence of trauma. Other CT scans / XRs without evidence of trauma. Basic labs performed, including CK / troponin. Troponin elevated, likely 2/2 possible rhabdo. CK elevated. Consulted with cardiology and hospitalist. Cards consulted, feel that troponin elevation is non-cardiac. Hospitalist to admit for continued hydration for rhabdomyolysis. Admitted in stable condition.  Patient seen and evaluated by myself and my attending, Dr. Darl Householder. Freddi Che, MD 02/28/14 [phone]

## 2014-02-26 NOTE — ED Notes (Signed)
Dr. Mingo Amber notified of pts Troponin of 1.47

## 2014-02-26 NOTE — ED Notes (Signed)
Dr. Silvio Clayman notified of CK.MB of 93.3

## 2014-02-26 NOTE — Progress Notes (Signed)
ANTICOAGULATION CONSULT NOTE - Initial Consult  Pharmacy Consult for Heparin  Indication: chest pain/ACS  No Known Allergies  Vital Signs: Temp: 97.9 F (36.6 C) (07/12 2230) Temp src: Oral (07/12 2022) BP: 152/65 mmHg (07/12 2315) Pulse Rate: 77 (07/12 2315)  Labs:  Recent Labs  02/26/14 2040 02/26/14 2140  HGB 13.2  --   HCT 38.0*  --   PLT 238  --   CREATININE 1.60*  --   CKTOTAL 9829* 9255*  CKMB  --  93.3*  TROPONINI  --  1.47*    Past Medical History  Diagnosis Date  . Hypertension   . GERD (gastroesophageal reflux disease)   . Hiatal hernia   . Arthritis     Assessment: 80 y/o M to start heparin for elevated troponin (likely rhabdo but cannot r/o NSTEMI). CBC ok, likely acute renal failure, other labs as above.   Goal of Therapy:  Heparin level 0.3-0.7 units/ml Monitor platelets by anticoagulation protocol: Yes   Plan:  -Heparin 3000 units BOLUS -Start heparin drip at 900 units/hr -0800 HL -Daily CBC/HL -Monitor for bleeding  Narda Bonds 02/26/2014,11:44 PM

## 2014-02-26 NOTE — ED Notes (Signed)
Patient transported to CT 

## 2014-02-27 DIAGNOSIS — I379 Nonrheumatic pulmonary valve disorder, unspecified: Secondary | ICD-10-CM | POA: Diagnosis not present

## 2014-02-27 DIAGNOSIS — R7989 Other specified abnormal findings of blood chemistry: Secondary | ICD-10-CM | POA: Diagnosis not present

## 2014-02-27 DIAGNOSIS — N179 Acute kidney failure, unspecified: Secondary | ICD-10-CM | POA: Diagnosis not present

## 2014-02-27 LAB — MRSA PCR SCREENING: MRSA by PCR: NEGATIVE

## 2014-02-27 LAB — COMPREHENSIVE METABOLIC PANEL
ALBUMIN: 2.9 g/dL — AB (ref 3.5–5.2)
ALK PHOS: 44 U/L (ref 39–117)
ALT: 58 U/L — AB (ref 0–53)
AST: 147 U/L — AB (ref 0–37)
Anion gap: 16 — ABNORMAL HIGH (ref 5–15)
BILIRUBIN TOTAL: 1 mg/dL (ref 0.3–1.2)
BUN: 39 mg/dL — ABNORMAL HIGH (ref 6–23)
CO2: 21 mEq/L (ref 19–32)
Calcium: 10 mg/dL (ref 8.4–10.5)
Chloride: 107 mEq/L (ref 96–112)
Creatinine, Ser: 1.54 mg/dL — ABNORMAL HIGH (ref 0.50–1.35)
GFR calc Af Amer: 45 mL/min — ABNORMAL LOW (ref >90)
GFR calc non Af Amer: 39 mL/min — ABNORMAL LOW (ref >90)
Glucose, Bld: 90 mg/dL (ref 70–99)
POTASSIUM: 3.6 meq/L — AB (ref 3.7–5.3)
SODIUM: 144 meq/L (ref 137–147)
Total Protein: 5.9 g/dL — ABNORMAL LOW (ref 6.0–8.3)

## 2014-02-27 LAB — CK TOTAL AND CKMB (NOT AT ARMC)
CK TOTAL: 4148 U/L — AB (ref 7–232)
CK, MB: 52.4 ng/mL — AB (ref 0.3–4.0)
CK, MB: 37.7 ng/mL — AB (ref 0.3–4.0)
RELATIVE INDEX: 0.9 (ref 0.0–2.5)
RELATIVE INDEX: 0.9 (ref 0.0–2.5)
Total CK: 5521 U/L — ABNORMAL HIGH (ref 7–232)

## 2014-02-27 LAB — TROPONIN I
TROPONIN I: 1.81 ng/mL — AB (ref <0.30)
Troponin I: 1.01 ng/mL (ref <0.30)
Troponin I: 1.66 ng/mL (ref <0.30)

## 2014-02-27 LAB — CBC
HCT: 32.7 % — ABNORMAL LOW (ref 39.0–52.0)
Hemoglobin: 11.2 g/dL — ABNORMAL LOW (ref 13.0–17.0)
MCH: 31.9 pg (ref 26.0–34.0)
MCHC: 34.3 g/dL (ref 30.0–36.0)
MCV: 93.2 fL (ref 78.0–100.0)
PLATELETS: 212 10*3/uL (ref 150–400)
RBC: 3.51 MIL/uL — ABNORMAL LOW (ref 4.22–5.81)
RDW: 14.1 % (ref 11.5–15.5)
WBC: 11.2 10*3/uL — ABNORMAL HIGH (ref 4.0–10.5)

## 2014-02-27 LAB — HEPARIN LEVEL (UNFRACTIONATED): HEPARIN UNFRACTIONATED: 0.24 [IU]/mL — AB (ref 0.30–0.70)

## 2014-02-27 MED ORDER — ACETAMINOPHEN 650 MG RE SUPP: 650.0000 mg | Freq: Four times a day (QID) | RECTAL | Status: DC | PRN

## 2014-02-27 MED ORDER — FOLIC ACID 1 MG PO TABS
1.0000 mg | ORAL_TABLET | Freq: Every day | ORAL | Status: DC
  Administered 2014-02-27 – 2014-03-02 (×4): 1 mg via ORAL
  Filled 2014-02-27 (×4): qty 1

## 2014-02-27 MED ORDER — THIAMINE HCL 100 MG/ML IJ SOLN
100.0000 mg | Freq: Every day | INTRAMUSCULAR | Status: DC
  Filled 2014-02-27 (×2): qty 1

## 2014-02-27 MED ORDER — ONDANSETRON HCL 4 MG/2ML IJ SOLN
4.0000 mg | Freq: Four times a day (QID) | INTRAMUSCULAR | Status: DC | PRN
  Administered 2014-02-27 – 2014-02-28 (×2): 4 mg via INTRAVENOUS
  Filled 2014-02-27 (×3): qty 2

## 2014-02-27 MED ORDER — LORAZEPAM 2 MG/ML IJ SOLN: 1.0000 mg | Freq: Four times a day (QID) | INTRAMUSCULAR | Status: AC | PRN

## 2014-02-27 MED ORDER — SODIUM CHLORIDE 0.9 % IJ SOLN
3.0000 mL | Freq: Two times a day (BID) | INTRAMUSCULAR | Status: DC
  Administered 2014-02-27 – 2014-03-02 (×5): 3 mL via INTRAVENOUS

## 2014-02-27 MED ORDER — ONDANSETRON HCL 4 MG PO TABS: 4.0000 mg | ORAL_TABLET | Freq: Four times a day (QID) | ORAL | Status: DC | PRN

## 2014-02-27 MED ORDER — ACETAMINOPHEN 325 MG PO TABS: 650.0000 mg | ORAL_TABLET | Freq: Four times a day (QID) | ORAL | Status: DC | PRN

## 2014-02-27 MED ORDER — SODIUM CHLORIDE 0.9 % IV SOLN
INTRAVENOUS | Status: AC
Start: 2014-02-27 — End: 2014-02-28
  Administered 2014-02-27 (×2): via INTRAVENOUS

## 2014-02-27 MED ORDER — LORAZEPAM 1 MG PO TABS: 1.0000 mg | ORAL_TABLET | Freq: Four times a day (QID) | ORAL | Status: AC | PRN

## 2014-02-27 MED ORDER — VITAMIN B-1 100 MG PO TABS
100.0000 mg | ORAL_TABLET | Freq: Every day | ORAL | Status: DC
  Administered 2014-02-27 – 2014-03-02 (×4): 100 mg via ORAL
  Filled 2014-02-27 (×4): qty 1

## 2014-02-27 MED ORDER — ADULT MULTIVITAMIN W/MINERALS CH
1.0000 | ORAL_TABLET | Freq: Every day | ORAL | Status: DC
  Administered 2014-02-27 – 2014-03-02 (×4): 1 via ORAL
  Filled 2014-02-27 (×4): qty 1

## 2014-02-27 NOTE — Progress Notes (Signed)
New Admission Note:   Arrival: via bed with ED tech Mental Orientation: A&Ox4 Telemetry: placed on 6e11, NSR Assessment:  See doc flowsheet Skin: Stage I on right and left hip at admission, foam placed  Sacrum red but blanchable, sacrum foam placed  Skin tears on bilateral knees and right elbow  Bilateral heels and elbows red but blanchable  Abrasion and bruises on right eye  Petechiae on chest  IV:  Left AC, infusing, clean, dry, patent Pain: no complaints  Safety Measures:  Call bell placed within reach; patient instructed on use of call bell and verbalized understanding. Bed in lowest position.  Yellow bracelet on.  Non-skid socks on.  Bed alarm on. [address] Orientation: Patient oriented to staff, room, and unit. Family: at bedside  Admission questions deferred until morning per pt request.  Orders have been reviewed and implemented. Will continue to monitor.  Arlyss Queen, RN, BSN

## 2014-02-27 NOTE — Evaluation (Addendum)
Occupational Therapy Evaluation Patient Details Name: Cornelious Bartolucci MRN: [id number] DOB: August 23, 1924 Today's Date: 02/27/2014    History of Present Illness Found on floor at home at [facility]; Acute kidney injury, Rhabdomyolysis   Clinical Impression   Pt admitted with above. Spoke with pt about recommending SNF for further rehab. Feel pt will benefit from acute OT to increase independence prior to d/c.     Follow Up Recommendations  SNF;Supervision/Assistance - 24 hour    Equipment Recommendations  3 in 1 bedside comode    Recommendations for Other Services       Precautions / Restrictions Precautions Precautions: Fall Restrictions Weight Bearing Restrictions: No      Mobility Bed Mobility   General bed mobility comments: not assessed  Transfers Overall transfer level: Needs assistance Equipment used: Rolling walker (2 wheeled) Transfers: Sit to/from Stand Sit to Stand: Mod assist Stand pivot transfers: Mod assist       General transfer comment: cues for technique. Assist to rise.         ADL Overall ADL's : Needs assistance/impaired     Grooming: Wash/dry hands;Wash/dry face;Moderate assistance;Standing (balance)           Upper Body Dressing : Sitting;Set up   Lower Body Dressing: Moderate assistance;Sit to/from stand   Toilet Transfer: Moderate assistance (sit to stand from chair)   Toileting- Clothing Manipulation and Hygiene: Minimal assistance;Sit to/from stand       Functional mobility during ADLs: Moderate assistance;Rolling walker (took a few steps) General ADL Comments: Pt with soiled gown on when OT entered-assisted in helping pt clean up. Educated on alternative technique (leaning side to side) for LB ADLs. Spoke with pt about recommending SNF for further rehab. Pt stood at sink for grooming and part of LB bathing. Pt requiring Min-Mod A for balance at sink during ADL task.     Vision                     Perception      Praxis      Pertinent Vitals/Pain Pain indicated during session, but none at end of session. Repositioned.     Hand Dominance     Extremity/Trunk Assessment Upper Extremity Assessment Upper Extremity Assessment: Generalized weakness   Lower Extremity Assessment Lower Extremity Assessment: Defer to PT evaluation       Communication Communication Communication: No difficulties   Cognition Arousal/Alertness: Awake/alert Behavior During Therapy: WFL for tasks assessed/performed Overall Cognitive Status: Within Functional Limits for tasks assessed                     General Comments       Exercises       Shoulder Instructions      Home Living Family/patient expects to be discharged to:: Private residence ([facility]) Living Arrangements: Alone Available Help at Discharge: Other (Comment);Available PRN/intermittently (staff) Type of Home: Apartment Home Access: Ramped entrance     Home Layout: One level     Bathroom Shower/Tub: Tub/shower unit         Home Equipment: Environmental consultant - 2 wheels;Cane - single point;Grab bars - toilet;Grab bars - tub/shower          Prior Functioning/Environment Level of Independence: Independent with assistive device(s)        Comments: uses RW when he has to go somewhere fast; cane when he is not in a hurry    OT Diagnosis: Acute pain;Generalized weakness   OT Problem List:  Decreased strength;Decreased activity tolerance;Impaired balance (sitting and/or standing);Pain;Decreased knowledge of precautions;Decreased knowledge of use of DME or AE   OT Treatment/Interventions: Self-care/ADL training;Therapeutic exercise;DME and/or AE instruction;Therapeutic activities;Balance training;Patient/family education    OT Goals(Current goals can be found in the care plan section) Acute Rehab OT Goals Patient Stated Goal: get back home and walk the hallway OT Goal Formulation: With patient Time For Goal  Achievement: 03/13/14 Potential to Achieve Goals: Good ADL Goals Pt Will Perform Grooming: standing;with min guard assist Pt Will Perform Lower Body Bathing: with min guard assist;sit to/from stand Pt Will Perform Lower Body Dressing: with min guard assist;sit to/from stand Pt Will Transfer to Toilet: with min guard assist;ambulating Pt Will Perform Toileting - Clothing Manipulation and hygiene: with min guard assist;sit to/from stand  OT Frequency: Min 2X/week   Barriers to D/C: Decreased caregiver support          Co-evaluation              End of Session Equipment Utilized During Treatment: Gait belt;Rolling walker  Activity Tolerance: Patient tolerated treatment well Patient left: in chair;with call bell/phone within reach;with family/visitor present;with chair alarm set   Time: 1757-1825 OT Time Calculation (min): 28 min Charges:  OT General Charges $OT Visit: 1 Procedure OT Evaluation $Initial OT Evaluation Tier I: 1 Procedure OT Treatments $Self Care/Home Management : 8-22 mins G-CodesBenito Mccreedy OTR/L [phone] 02/27/2014, 6:50 PM

## 2014-02-27 NOTE — Progress Notes (Signed)
  Echocardiogram 2D Echocardiogram has been performed.  [name] 02/27/2014, 2:18 PM

## 2014-02-27 NOTE — Progress Notes (Signed)
Lab results received for troponin 1.8.  No complaints of shortness of breath or chest pain.  Patient resting comfortably.  Notified MD on call.  Stated she will inform AM team of results.  No new orders received.  Will continue to monitor.

## 2014-02-27 NOTE — Evaluation (Signed)
Physical Therapy Evaluation Patient Details Name: [name] MRN: [id number] DOB: 30-Jan-1925 Today's Date: 02/27/2014   History of Present Illness  Found on floor at home at [facility]; Acute kidney injury, Rhabdomyolysis  Clinical Impression  Pt admitted with above. Pt currently with functional limitations due to the deficits listed below (see PT Problem List).  Pt will benefit from skilled PT to increase their independence and safety with mobility to allow discharge to the venue listed below.    Noted pt is from [place] facility; would like clarfication re: level of assist available to him; Worth considering dc to SNF level of care to bridge back to his independent apt -- it is also likely time to consider a higher level of care for the long-term     Follow Up Recommendations SNF;Supervision/Assistance - 24 hour    Equipment Recommendations  3in1 (PT)    Recommendations for Other Services OT consult     Precautions / Restrictions Precautions Precautions: Fall      Mobility  Bed Mobility Overal bed mobility: Needs Assistance Bed Mobility: Supine to Sit;Sit to Supine     Supine to sit: Mod assist Sit to supine: Mod assist   General bed mobility comments: mod assist to pull to sit with HOB elevated  Transfers Overall transfer level: Needs assistance Equipment used: Rolling walker (2 wheeled) Transfers: Sit to/from [facility] Sit to Stand: Mod assist Stand pivot transfers: Mod assist       General transfer comment: REquiring antigravity assist to rise and steadying assist; stood to rW and took pivot steps to recliner, noted loss of balance posteriorly  Ambulation/Gait                Stairs            Wheelchair Mobility    Modified Rankin (Stroke Patients Only)       Balance Overall balance assessment: Needs assistance Sitting-balance support: Bilateral upper extremity supported Sitting balance-Leahy Scale: Fair      Standing balance support: Bilateral upper extremity supported Standing balance-Leahy Scale: Poor Standing balance comment: needs UE support                             Pertinent Vitals/Pain Significantly incr pain bil knees with WBing patient repositioned for comfort     Home Living Family/patient expects to be discharged to:: Private residence ([facility]) Living Arrangements: Alone Available Help at Discharge: Other (Comment);Available PRN/intermittently (staff) Type of Home: Apartment Home Access: Ramped entrance     Home Layout: One level Home Equipment: Walker - 2 wheels;Cane - single point      Prior Function Level of Independence: Independent with assistive device(s)         Comments: uses RW when he has to go somewhere fast; cane when he is not in a hurry     Hand Dominance        Extremity/Trunk Assessment   Upper Extremity Assessment: Generalized weakness           Lower Extremity Assessment: Generalized weakness (and abrasions/bil knee swelling)         Communication   Communication: No difficulties  Cognition Arousal/Alertness: Awake/alert Behavior During Therapy: WFL for tasks assessed/performed Overall Cognitive Status: Within Functional Limits for tasks assessed                      General Comments      Exercises  Assessment/Plan    PT Assessment Patient needs continued PT services  PT Diagnosis Difficulty walking;Generalized weakness;Acute pain   PT Problem List Decreased strength;Decreased range of motion;Decreased activity tolerance;Decreased balance;Decreased mobility;Decreased coordination;Decreased knowledge of use of DME;Pain  PT Treatment Interventions DME instruction;Gait training;Stair training;Functional mobility training;Therapeutic activities;Therapeutic exercise;Patient/family education   PT Goals (Current goals can be found in the Care Plan section) Acute  Rehab PT Goals Patient Stated Goal: did not state PT Goal Formulation: With patient Time For Goal Achievement: 03/13/14 Potential to Achieve Goals: Good    Frequency Min 3X/week   Barriers to discharge   Need clarification of how much assist is available to him at his current place    Co-evaluation               End of Session Equipment Utilized During Treatment: Gait belt Activity Tolerance: Patient tolerated treatment well Patient left: in bed (about to transport to vascular) Nurse Communication: Mobility status         Time: 6269-4854 PT Time Calculation (min): 26 min   Charges:   PT Evaluation $Initial PT Evaluation Tier I: 1 Procedure PT Treatments $Therapeutic Activity: 8-22 mins   PT G Codes:          Quin Hoop 02/27/2014, 5:03 PM  Roney Marion, [place] Pager [phone] Office [phone]

## 2014-02-27 NOTE — Consult Note (Signed)
Name: [name] is a 80 y.o. male Admit date: 02/26/2014 Referring Physician:  Ephriam Jenkins, MD Primary Physician:  Jani Gravel, MD  Primary Cardiologist:  None  Reason for Consultation:  Elevated Troponin  ASSESSMENT: 1. Elevated troponin not felt to represent ACS. 2. Acute kidney injury, possibly with a component of dehydration 3. Rhabdomyolysis 4. Elevated hepatic enzymes  PLAN:  1. No specific cardiac workup is recommended. Please call if you have further questions or concerns. 2. Given age and frailty, a conservative medical management approach is most prudent.   HPI: 80 year old gentleman who fell to the floor after getting out of bed and was unable to get up. He stayed on the floor for up to 12 hours. He did not have nor does he currently have any cardiopulmonary complaints. There is no history of heart disease.  PMH:   Past Medical History  Diagnosis Date  . Hypertension   . GERD (gastroesophageal reflux disease)   . Hiatal hernia   . Arthritis     PSH:   Past Surgical History  Procedure Laterality Date  . Replacement total knee bilateral    . Back surgery    . Inguinal hernia repair    . Colonoscopy     Allergies:  Review of patient's allergies indicates no known allergies. Prior to Admit Meds:   Facility-administered medications prior to admission  Medication Dose Route Frequency Provider Last Rate Last Dose  . 0.9 %  sodium chloride infusion  500 mL Intravenous Continuous Lafayette Dragon, MD       Prescriptions prior to admission  Medication Sig Dispense Refill  . dexlansoprazole (DEXILANT) 60 MG capsule Take 60 mg by mouth daily.        Marland Kitchen lisinopril (PRINIVIL,ZESTRIL) 5 MG tablet Take 5 mg by mouth daily.       . Probiotic Product (ALIGN PO) Take 1 tablet by mouth daily.         Fam HX:    Family History  Problem Relation Age of Onset  . Colon cancer Neg Hx   . Alcoholism Father    Social HX:    History   Social History  . Marital Status: Divorced      Spouse Name: N/A    Number of Children: 2  . Years of Education: N/A   Occupational History  . retired    Social History Main Topics  . Smoking status: Former Research scientist (life sciences)  . Smokeless tobacco: Never Used  . Alcohol Use: 7.0 oz/week    14 drink(s) per week     Comment: couple of drinks of rum every day  . Drug Use: No  . Sexual Activity: Not on file   Other Topics Concern  . Not on file   Social History Narrative  . No narrative on file     Review of Systems: Denies syncope, edema, orthopnea, chest pain, transient neurological symptoms, palpitations, and claudication. Appetite is been relatively stable. Mobility has been somewhat limited due to frailty and generalized weakness. Suffers with chronic low back discomfort that impairs mobility. History of degenerative disc disease. History of gastroesophageal reflux.  Physical Exam: Blood pressure 99/48, pulse 95, temperature 98 F (36.7 C), temperature source Oral, resp. rate 19, height 5\' 4"  (1.626 m), weight 145 lb 14.4 oz (66.18 kg), SpO2 99.00%. Weight change:   Pleasant, very frail elderly gentleman in no acute distress. Lying in his bed at 47 conversant and watching television. Mild pallor is noted. No jaundice is noted. Neck  exam reveals no carotid bruit or JVD Clear lung fields anteriorly and at the bases posteriorly The cardiac exam reveals a soft 1 of 6 systolic murmur at the right upper sternal border. Irregularity in the rhythm is noted. No diastolic murmurs heard. No gallop is heard. Abdomen is scaphoid. No tenderness is noted. Extremities reveal no edema. Radial pulses and posterior tibial pulses are 2+. The neurological exam is remarkable for an excellent memory and no focal deficits. Labs: Lab Results  Component Value Date   WBC 11.2* 02/27/2014   HGB 11.2* 02/27/2014   HCT 32.7* 02/27/2014   MCV 93.2 02/27/2014   PLT 212 02/27/2014    Recent Labs Lab 02/27/14 0821  NA 144  K 3.6*  CL 107  CO2 21  BUN 39*   CREATININE 1.54*  CALCIUM 10.0  PROT 5.9*  BILITOT 1.0  ALKPHOS 44  ALT 58*  AST 147*  GLUCOSE 90   No results found for this basename: PTT   Lab Results  Component Value Date   INR 1.4 04/30/2007   INR 1.4 04/29/2007   INR 1.1 04/28/2007   Lab Results  Component Value Date   CKTOTAL 4148* 02/27/2014   CKMB 37.7* 02/27/2014   TROPONINI 1.66* 02/27/2014      Radiology:  Dg Chest 1 View  02/26/2014   CLINICAL DATA:  Patient found down.  EXAM: CHEST - 1 VIEW  COMPARISON:  PA and lateral chest 01/30/2014.  FINDINGS: Calcified granuloma left lower lobe again seen. The lungs are otherwise clear. Heart size is normal. No pneumothorax or pleural effusion.  IMPRESSION: No acute finding.   Electronically Signed   By: Inge Rise M.D.   On: 02/26/2014 21:35   Dg Lumbar Spine Complete  02/26/2014   CLINICAL DATA:  Fall.  EXAM: LUMBAR SPINE - COMPLETE 4+ VIEW  COMPARISON:  10/23/2008  FINDINGS: There is severe lumbar dextroscoliosis with advanced degenerative disc and facet disease. Bulky osteophytes present along the concavity of the scoliotic curve. These changes have progressed from 2010, and limit the sensitivity of radiography for detecting acute fracture. There is chronic grade 1 anterolisthesis at L5-S1, associated with bilateral pars interarticularis defects. There is no definitive fracture or traumatic subluxation.  IMPRESSION: 1. No acute fracture identified. 2. Dextroscoliosis and advanced lumbar degenerative changes.   Electronically Signed   By: Jorje Guild M.D.   On: 02/26/2014 21:39   Dg Hip Complete Right  02/26/2014   CLINICAL DATA:  Patient found down.  Right hip pain.  EXAM: RIGHT HIP - COMPLETE 2+ VIEW  COMPARISON:  None.  FINDINGS: Both hips are located. Fracture is identified. Lower lumbar degenerative change in convex right scoliosis are partially visualized.  IMPRESSION: No acute abnormality.   Electronically Signed   By: Inge Rise M.D.   On: 02/26/2014 21:40    Ct Head Wo Contrast  02/26/2014   CLINICAL DATA:  Pain post trauma  EXAM: CT HEAD WITHOUT CONTRAST  CT CERVICAL SPINE WITHOUT CONTRAST  TECHNIQUE: Multidetector CT imaging of the head and cervical spine was performed following the standard protocol without intravenous contrast. Multiplanar CT image reconstructions of the cervical spine were also generated.  COMPARISON:  None.  FINDINGS: CT HEAD FINDINGS  There is moderate diffuse atrophy. There is no appreciable mass, hemorrhage, subdural or epidural hematomas, or midline shift. There is mild patchy small vessel disease in the centra semiovale bilaterally. No acute appearing infarct is seen the bony calvarium appears intact. The mastoid air cells are  clear. There are retention cysts in each maxillary antrum. There is a small retention cyst in the left sphenoid sinus. There is mucosal thickening and multiple ethmoid air cells bilaterally. There is mild inferior left frontal sinus disease. There is leftward deviation of the nasal septum.  CT CERVICAL SPINE FINDINGS:  There is no fracture or spondylolisthesis. Prevertebral soft tissues and predental space regions are normal. There is levoscoliosis.  There is mild disc space narrowing at C4-5 and C5-6. There is facet hypertrophy at most levels bilaterally. There is no frank disc extrusion or stenosis. There is calcification in each carotid artery, more on the left than on the right. There is bilateral apical pleural thickening and scarring.  IMPRESSION: CT head: Atrophy with mild periventricular small vessel disease. Paranasal sinus disease. Study otherwise unremarkable.  CT cervical spine: Multifocal osteoarthritic change. Scoliosis. No fracture or spondylolisthesis. Calcification in each carotid artery, more on the left than on the right.   Electronically Signed   By: Lowella Grip M.D.   On: 02/26/2014 21:25   Ct Cervical Spine Wo Contrast  02/26/2014   CLINICAL DATA:  Pain post trauma  EXAM: CT HEAD  WITHOUT CONTRAST  CT CERVICAL SPINE WITHOUT CONTRAST  TECHNIQUE: Multidetector CT imaging of the head and cervical spine was performed following the standard protocol without intravenous contrast. Multiplanar CT image reconstructions of the cervical spine were also generated.  COMPARISON:  None.  FINDINGS: CT HEAD FINDINGS  There is moderate diffuse atrophy. There is no appreciable mass, hemorrhage, subdural or epidural hematomas, or midline shift. There is mild patchy small vessel disease in the centra semiovale bilaterally. No acute appearing infarct is seen the bony calvarium appears intact. The mastoid air cells are clear. There are retention cysts in each maxillary antrum. There is a small retention cyst in the left sphenoid sinus. There is mucosal thickening and multiple ethmoid air cells bilaterally. There is mild inferior left frontal sinus disease. There is leftward deviation of the nasal septum.  CT CERVICAL SPINE FINDINGS:  There is no fracture or spondylolisthesis. Prevertebral soft tissues and predental space regions are normal. There is levoscoliosis.  There is mild disc space narrowing at C4-5 and C5-6. There is facet hypertrophy at most levels bilaterally. There is no frank disc extrusion or stenosis. There is calcification in each carotid artery, more on the left than on the right. There is bilateral apical pleural thickening and scarring.  IMPRESSION: CT head: Atrophy with mild periventricular small vessel disease. Paranasal sinus disease. Study otherwise unremarkable.  CT cervical spine: Multifocal osteoarthritic change. Scoliosis. No fracture or spondylolisthesis. Calcification in each carotid artery, more on the left than on the right.   Electronically Signed   By: Lowella Grip M.D.   On: 02/26/2014 21:25    EKG:  Normal sinus rhythm with PACs. No acute ST-T change    Sinclair Grooms 02/27/2014 11:44 AM

## 2014-02-27 NOTE — Progress Notes (Signed)
ANTICOAGULATION CONSULT NOTE - Follow Up Consult  Pharmacy Consult for Heparin Indication: chest pain/ACS  No Known Allergies  Patient Measurements: Height: 5\' 4"  (162.6 cm) Weight: 145 lb 14.4 oz (66.18 kg) IBW/kg (Calculated) : 59.2 Heparin Dosing Weight: 66 kg  Vital Signs: Temp: 98.1 F (36.7 C) (07/13 0459) Temp src: Oral (07/13 0459) BP: 114/55 mmHg (07/13 0459) Pulse Rate: 73 (07/13 0459)  Labs:  Recent Labs  02/26/14 2040  02/26/14 2140 02/26/14 2331 02/27/14 0400 02/27/14 0821  HGB 13.2  --   --   --   --  11.2*  HCT 38.0*  --   --   --   --  32.7*  PLT 238  --   --   --   --  212  HEPARINUNFRC  --   --   --   --   --  0.24*  CREATININE 1.60*  --   --   --   --  1.54*  CKTOTAL 9829*  --  9255*  --  5521* 4148*  CKMB  --   --  93.3*  --  52.4* 37.7*  TROPONINI  --   < > 1.47* 1.01* 1.81* 1.66*  < > = values in this interval not displayed.  Estimated Creatinine Clearance: 28.8 ml/min (by C-G formula based on Cr of 1.54).   Medications:  Infusions:  . sodium chloride 75 mL/hr at 02/27/14 0031  . heparin 900 Units/hr (02/27/14 0031)    Assessment: 83 YOM who continues on heparin for r/o NSTEMI in the setting of elevated troponins though thought to likely be d/t rhabdomyolysis- awaiting further cardiology work-up. Heparin level this morning is SUBtherapeutic (HL 0.24, goal of 0.3-0.7). Hgb/Hct/Plt slight drop - likely dilutional. Black emesis noted - will watch.   Goal of Therapy:  Heparin level 0.3-0.7 units/ml Monitor platelets by anticoagulation protocol: Yes   Plan:  1. Increase heparin drip to 1050 units/hr (10.5 ml/hr) 2. Will continue to monitor for any signs/symptoms of bleeding and will follow up with heparin level in 8 hours   Alycia Rossetti, PharmD, BCPS Clinical Pharmacist Pager: [phone] 02/27/2014 10:16 AM

## 2014-02-27 NOTE — Evaluation (Signed)
Clinical/Bedside Swallow Evaluation Patient Details  Name: [name] MRN: [id number] Date of Birth: Oct 31, 1924  Today's Date: 02/27/2014 Time: 3151-7616 SLP Time Calculation (min): 14 min  Past Medical History:  Past Medical History  Diagnosis Date  . Hypertension   . GERD (gastroesophageal reflux disease)   . Hiatal hernia   . Arthritis    Past Surgical History:  Past Surgical History  Procedure Laterality Date  . Replacement total knee bilateral    . Back surgery    . Inguinal hernia repair    . Colonoscopy     HPI:  80 y.o. with history of hypertension, GERD, arthritis, chronic back pain, osteoporosis admitted after fall. Pt. found to be in acute renal failure.  CXR No acute finding.    Assessment / Plan / Recommendation Clinical Impression  Pt. complained of difficulty swallowing large, uncoated pills and poor positioning earlier today.  Pt. upright in chair without evidence of chronic oral or pharyngeal dysphagia.  He denies coughing regularly with food or liquids.  SLP advised pt. to consume larger pills whole in applesauce, yogurt or pudding if desired.  Educated on general swallow precautions.  Continue regular texture diet and thin liquids, no further ST needed.    Aspiration Risk  Mild    Diet Recommendation Regular;Thin liquid   Liquid Administration via: Straw;Cup Medication Administration: Whole meds with liquid (or whole applesauce if needed) Supervision: Patient able to self feed Compensations: Slow rate;Small sips/bites Postural Changes and/or Swallow Maneuvers: Seated upright 90 degrees;Upright 30-60 min after meal    Other  Recommendations Oral Care Recommendations: Oral care BID   Follow Up Recommendations  None    Frequency and Duration        Pertinent Vitals/Pain WDL         Swallow Study        Oral/Motor/Sensory Function Overall Oral Motor/Sensory Function: Appears within functional limits for tasks assessed   Ice Chips Ice chips:  Not tested   Thin Liquid Thin Liquid: Within functional limits Presentation: Straw    Nectar Thick Nectar Thick Liquid: Not tested   Honey Thick Honey Thick Liquid: Not tested   Puree Puree: Not tested   Solid       Solid: Within functional limits       Orbie Pyo [employer] [phone]  02/27/2014

## 2014-02-27 NOTE — Progress Notes (Signed)
[facility] PROGRESS NOTE  Cloyde Oregel HWE:[id number] DOB: September 20, 1924 DOA: 02/26/2014 PCP: Jani Gravel, MD Interim summary: [name] is a 80 y.o. Caucasian male with history of hypertension reports that he is not currently on any antihypertensive medications, GERD, arthritis, chronic back pain uses over-the-counter patches, and osteoporosis who presents with fall. He lives currently at [facility] facility, it was noted that he was missing as a result sent people to investigate And found him on the floor. He reports he was on the floor for almost the whole day. In the emergency department he was found to be in acute renal failure with a creatinine of 1.6 and he was found to have elevated CK of 9829 suggestive of rhabdomyolysis. As a result hospitalist service was asked to admit the patient for further care and management. His troponins were found to be elevated. Cardiology consulted and no new work up needed at this time.    Assessment/Plan: Acute renal failure  Likely due to dehydration from poor oral intake and from rhabdomyolysis. Hydrate the patient on IV fluids. Reassess renal function in the morning.  Rhabdomyolysis  Likely due to fall and being on the floor for extended period of time. Continue IV hydration. Check CK again in the morning to make sure the renal function continues to trend in the right direction.  Elevated troponin  EKG shows no signs of ischemia. Continue aspirin which was started in the emergency department. Suspect elevated troponin is likely related to rhabdomyolysis. CK-MB also elevated, again likely from admission. Started the patient on heparin as there is concern for possible NSTEMI.  Cardiology consulted and no new recommendations. Stopped the heparin drip.   Acute on chronic low back pain/fall  Request PT and OT evaluation the morning.  Elevated liver function tests  Likely due to daily alcohol use. Continue to monitor.  Alcohol use  Started the patient on  CIWA protocol. Thiamine and folic acid.  Generalized weakness  PT and OT evaluation as indicated above.  GERD  Stable.  Hypertension  Patient indicates that he no longer takes any antihypertensive medications at this time. Stable at this time. Continue to monitor.  Prophylaxis  Lovenox.   Code Status: full code.  Family Communication: none at bedside Disposition Plan: pending.    Consultants:  cardiology  Procedures:  echo  Antibiotics:  none  HPI/Subjective: Back Pain better  Objective: Filed Vitals:   02/27/14 1015  BP: 99/48  Pulse: 95  Temp: 98 F (36.7 C)  Resp: 19    Intake/Output Summary (Last 24 hours) at 02/27/14 1502 Last data filed at 02/27/14 1300  Gross per 24 hour  Intake    303 ml  Output      0 ml  Net    303 ml   Filed Weights   02/27/14 0027  Weight: 66.18 kg (145 lb 14.4 oz)    Exam:   General:  Alert, sitting in chair, comfortable  Cardiovascular: s1s2  Respiratory: ctab  Abdomen: soft NT ND SB+  Musculoskeletal: trace pedal edema  Data Reviewed: Basic Metabolic Panel:  Recent Labs Lab 02/26/14 2040 02/27/14 0821  NA 140 144  K 3.9 3.6*  CL 99 107  CO2 22 21  GLUCOSE 102* 90  BUN 39* 39*  CREATININE 1.60* 1.54*  CALCIUM 11.6* 10.0   Liver Function Tests:  Recent Labs Lab 02/26/14 2040 02/27/14 0821  AST 215* 147*  ALT 69* 58*  ALKPHOS 52 44  BILITOT 1.7* 1.0  PROT 7.1 5.9*  ALBUMIN 3.6 2.9*   No results found for this basename: LIPASE, AMYLASE,  in the last 168 hours No results found for this basename: AMMONIA,  in the last 168 hours CBC:  Recent Labs Lab 02/26/14 2040 02/27/14 0821  WBC 12.0* 11.2*  HGB 13.2 11.2*  HCT 38.0* 32.7*  MCV 92.7 93.2  PLT 238 212   Cardiac Enzymes:  Recent Labs Lab 02/26/14 2040 02/26/14 2140 02/26/14 2331 02/27/14 0400 02/27/14 0821  CKTOTAL 9829* 9255*  --  5521* 4148*  CKMB  --  93.3*  --  52.4* 37.7*  TROPONINI  --  1.47* 1.01* 1.81* 1.66*    BNP (last 3 results) No results found for this basename: PROBNP,  in the last 8760 hours CBG: No results found for this basename: GLUCAP,  in the last 168 hours  Recent Results (from the past 240 hour(s))  MRSA PCR SCREENING     Status: None   Collection Time    02/27/14  1:32 AM      Result Value Ref Range Status   MRSA by PCR NEGATIVE  NEGATIVE Final   Comment:            The GeneXpert MRSA Assay (FDA     approved for NASAL specimens     only), is one component of a     comprehensive MRSA colonization     surveillance program. It is not     intended to diagnose MRSA     infection nor to guide or     monitor treatment for     MRSA infections.     Studies: Dg Chest 1 View  02/26/2014   CLINICAL DATA:  Patient found down.  EXAM: CHEST - 1 VIEW  COMPARISON:  PA and lateral chest 01/30/2014.  FINDINGS: Calcified granuloma left lower lobe again seen. The lungs are otherwise clear. Heart size is normal. No pneumothorax or pleural effusion.  IMPRESSION: No acute finding.   Electronically Signed   By: Inge Rise M.D.   On: 02/26/2014 21:35   Dg Lumbar Spine Complete  02/26/2014   CLINICAL DATA:  Fall.  EXAM: LUMBAR SPINE - COMPLETE 4+ VIEW  COMPARISON:  10/23/2008  FINDINGS: There is severe lumbar dextroscoliosis with advanced degenerative disc and facet disease. Bulky osteophytes present along the concavity of the scoliotic curve. These changes have progressed from 2010, and limit the sensitivity of radiography for detecting acute fracture. There is chronic grade 1 anterolisthesis at L5-S1, associated with bilateral pars interarticularis defects. There is no definitive fracture or traumatic subluxation.  IMPRESSION: 1. No acute fracture identified. 2. Dextroscoliosis and advanced lumbar degenerative changes.   Electronically Signed   By: Jorje Guild M.D.   On: 02/26/2014 21:39   Dg Hip Complete Right  02/26/2014   CLINICAL DATA:  Patient found down.  Right hip pain.  EXAM:  RIGHT HIP - COMPLETE 2+ VIEW  COMPARISON:  None.  FINDINGS: Both hips are located. Fracture is identified. Lower lumbar degenerative change in convex right scoliosis are partially visualized.  IMPRESSION: No acute abnormality.   Electronically Signed   By: Inge Rise M.D.   On: 02/26/2014 21:40   Ct Head Wo Contrast  02/26/2014   CLINICAL DATA:  Pain post trauma  EXAM: CT HEAD WITHOUT CONTRAST  CT CERVICAL SPINE WITHOUT CONTRAST  TECHNIQUE: Multidetector CT imaging of the head and cervical spine was performed following the standard protocol without intravenous contrast. Multiplanar CT image reconstructions of the cervical spine were also  generated.  COMPARISON:  None.  FINDINGS: CT HEAD FINDINGS  There is moderate diffuse atrophy. There is no appreciable mass, hemorrhage, subdural or epidural hematomas, or midline shift. There is mild patchy small vessel disease in the centra semiovale bilaterally. No acute appearing infarct is seen the bony calvarium appears intact. The mastoid air cells are clear. There are retention cysts in each maxillary antrum. There is a small retention cyst in the left sphenoid sinus. There is mucosal thickening and multiple ethmoid air cells bilaterally. There is mild inferior left frontal sinus disease. There is leftward deviation of the nasal septum.  CT CERVICAL SPINE FINDINGS:  There is no fracture or spondylolisthesis. Prevertebral soft tissues and predental space regions are normal. There is levoscoliosis.  There is mild disc space narrowing at C4-5 and C5-6. There is facet hypertrophy at most levels bilaterally. There is no frank disc extrusion or stenosis. There is calcification in each carotid artery, more on the left than on the right. There is bilateral apical pleural thickening and scarring.  IMPRESSION: CT head: Atrophy with mild periventricular small vessel disease. Paranasal sinus disease. Study otherwise unremarkable.  CT cervical spine: Multifocal osteoarthritic  change. Scoliosis. No fracture or spondylolisthesis. Calcification in each carotid artery, more on the left than on the right.   Electronically Signed   By: Lowella Grip M.D.   On: 02/26/2014 21:25   Ct Cervical Spine Wo Contrast  02/26/2014   CLINICAL DATA:  Pain post trauma  EXAM: CT HEAD WITHOUT CONTRAST  CT CERVICAL SPINE WITHOUT CONTRAST  TECHNIQUE: Multidetector CT imaging of the head and cervical spine was performed following the standard protocol without intravenous contrast. Multiplanar CT image reconstructions of the cervical spine were also generated.  COMPARISON:  None.  FINDINGS: CT HEAD FINDINGS  There is moderate diffuse atrophy. There is no appreciable mass, hemorrhage, subdural or epidural hematomas, or midline shift. There is mild patchy small vessel disease in the centra semiovale bilaterally. No acute appearing infarct is seen the bony calvarium appears intact. The mastoid air cells are clear. There are retention cysts in each maxillary antrum. There is a small retention cyst in the left sphenoid sinus. There is mucosal thickening and multiple ethmoid air cells bilaterally. There is mild inferior left frontal sinus disease. There is leftward deviation of the nasal septum.  CT CERVICAL SPINE FINDINGS:  There is no fracture or spondylolisthesis. Prevertebral soft tissues and predental space regions are normal. There is levoscoliosis.  There is mild disc space narrowing at C4-5 and C5-6. There is facet hypertrophy at most levels bilaterally. There is no frank disc extrusion or stenosis. There is calcification in each carotid artery, more on the left than on the right. There is bilateral apical pleural thickening and scarring.  IMPRESSION: CT head: Atrophy with mild periventricular small vessel disease. Paranasal sinus disease. Study otherwise unremarkable.  CT cervical spine: Multifocal osteoarthritic change. Scoliosis. No fracture or spondylolisthesis. Calcification in each carotid artery,  more on the left than on the right.   Electronically Signed   By: Lowella Grip M.D.   On: 02/26/2014 21:25    Scheduled Meds: . folic acid  1 mg Oral Daily  . multivitamin with minerals  1 tablet Oral Daily  . sodium chloride  3 mL Intravenous Q12H  . thiamine  100 mg Oral Daily   Or  . thiamine  100 mg Intravenous Daily   Continuous Infusions: . sodium chloride 75 mL/hr at 02/27/14 1453    Principal Problem:  AKI (acute kidney injury) Active Problems:   GERD (gastroesophageal reflux disease)   Lumbar disc disease   Rhabdomyolysis   Chronic low back pain   Elevated troponin   Elevated LFTs   Alcohol use   Acute renal failure   Generalized weakness    Time spent: 25 minutes    [facility] Pager [phone]. If 7PM-7AM, please contact night-coverage at [email or web address], password [facility] 02/27/2014, 3:02 PM  LOS: 1 day

## 2014-02-28 DIAGNOSIS — E440 Moderate protein-calorie malnutrition: Secondary | ICD-10-CM | POA: Insufficient documentation

## 2014-02-28 DIAGNOSIS — N179 Acute kidney failure, unspecified: Secondary | ICD-10-CM | POA: Diagnosis not present

## 2014-02-28 DIAGNOSIS — R7989 Other specified abnormal findings of blood chemistry: Secondary | ICD-10-CM | POA: Diagnosis not present

## 2014-02-28 LAB — CBC
HCT: 31.7 % — ABNORMAL LOW (ref 39.0–52.0)
Hemoglobin: 10.7 g/dL — ABNORMAL LOW (ref 13.0–17.0)
MCH: 31.7 pg (ref 26.0–34.0)
MCHC: 33.8 g/dL (ref 30.0–36.0)
MCV: 93.8 fL (ref 78.0–100.0)
Platelets: 194 10*3/uL (ref 150–400)
RBC: 3.38 MIL/uL — ABNORMAL LOW (ref 4.22–5.81)
RDW: 14.1 % (ref 11.5–15.5)
WBC: 9.7 10*3/uL (ref 4.0–10.5)

## 2014-02-28 LAB — BASIC METABOLIC PANEL
Anion gap: 12 (ref 5–15)
BUN: 34 mg/dL — ABNORMAL HIGH (ref 6–23)
CO2: 24 mEq/L (ref 19–32)
Calcium: 9 mg/dL (ref 8.4–10.5)
Chloride: 106 mEq/L (ref 96–112)
Creatinine, Ser: 1.31 mg/dL (ref 0.50–1.35)
GFR calc Af Amer: 55 mL/min — ABNORMAL LOW (ref >90)
GFR calc non Af Amer: 48 mL/min — ABNORMAL LOW (ref >90)
Glucose, Bld: 104 mg/dL — ABNORMAL HIGH (ref 70–99)
Potassium: 3.7 mEq/L (ref 3.7–5.3)
Sodium: 142 mEq/L (ref 137–147)

## 2014-02-28 LAB — CK: CK TOTAL: 1598 U/L — AB (ref 7–232)

## 2014-02-28 MED ORDER — SODIUM CHLORIDE 0.9 % IV SOLN: INTRAVENOUS | Status: AC

## 2014-02-28 MED ORDER — HEPARIN SODIUM (PORCINE) 5000 UNIT/ML IJ SOLN
5000.0000 [IU] | Freq: Three times a day (TID) | INTRAMUSCULAR | Status: DC
  Administered 2014-02-28 – 2014-03-02 (×6): 5000 [IU] via SUBCUTANEOUS
  Filled 2014-02-28 (×6): qty 1

## 2014-02-28 MED ORDER — ENSURE COMPLETE PO LIQD
237.0000 mL | ORAL | Status: DC
  Administered 2014-03-01 – 2014-03-02 (×2): 237 mL via ORAL

## 2014-02-28 MED ORDER — SIMETHICONE 80 MG PO CHEW
80.0000 mg | CHEWABLE_TABLET | Freq: Four times a day (QID) | ORAL | Status: DC | PRN
  Administered 2014-02-28: 80 mg via ORAL
  Filled 2014-02-28: qty 1

## 2014-02-28 NOTE — Plan of Care (Addendum)
INITIAL NUTRITION ASSESSMENT  DOCUMENTATION CODES Per approved criteria  -Non-severe (moderate) malnutrition in the context of chronic illness   INTERVENTION: Add Ensure Complete po daily, each supplement provides 350 kcal and 13 grams of protein. RD to continue to follow nutrition care plan.  NUTRITION DIAGNOSIS: Inadequate oral intake related to advanced age as evidenced by patient report.   Goal: Intake to meet >90% of estimated nutrition needs.  Monitor:  weight trends, lab trends, I/O's, PO intake, supplement tolerance  Reason for Assessment: Malnutrition Screening Tool  80 y.o. male  Admitting Dx: AKI (acute kidney injury)  ASSESSMENT: PMHx significant for HTN. Admitted with chronic back pain, pt was found down for several hours, unable to get up 2/2 pain. Work-up reveals ARF and rhabdomyolisis.  Patient reports that he hasn't been eating great for a few years. Appetite has decreased with his age. Patient reports that he typically eats a yogurt with fruit and Ensure for breakfast and eats about 2/3 of the dinner that they provide at [facility]. He ate about a third of his breakfast this morning but states that he had a big breakfast and the amount that he ate was similar to his breakfast at his facility. Agreeable to Ensure daily while here.   Nutrition Focused Physical Exam:  Subcutaneous Fat:  Orbital Region: WNL Upper Arm Region: moderate depletion Thoracic and Lumbar Region: moderate depletion  Muscle:  Temple Region: WNL Clavicle Bone Region: severe depletion Clavicle and Acromion Bone Region: severe depletion Scapular Bone Region: n/a Dorsal Hand: n/a Patellar Region: moderate depletion Anterior Thigh Region: mild depletion Posterior Calf Region: severe depletion  Edema: WNL  Pt meets criteria for moderate MALNUTRITION in the context of chronic illness as evidenced by moderate fat and muscle wasting.   Labs reviewed.  Height: Ht Readings from Last  1 Encounters:  02/27/14 5\' 4"  (1.626 m)    Weight: Wt Readings from Last 1 Encounters:  02/27/14 145 lb 14.4 oz (66.18 kg)    Ideal Body Weight: 130 lb  % Ideal Body Weight: 112%  Wt Readings from Last 10 Encounters:  02/27/14 145 lb 14.4 oz (66.18 kg)  08/15/11 151 lb (68.493 kg)  08/13/11 151 lb 12.8 oz (68.856 kg)    Usual Body Weight: 150 lb  % Usual Body Weight: 97%  BMI:  Body mass index is 25.03 kg/(m^2). WNL  Estimated Nutritional Needs: Kcal: 1550 - 1700 Protein: 70 - 80 grams Fluid: at least 1.6 liters daily  Skin:  Stage I R hip Stage I L elbow  Diet Order: Cardiac  EDUCATION NEEDS: -Education needs addressed   Intake/Output Summary (Last 24 hours) at 02/28/14 1224 Last data filed at 02/28/14 0927  Gross per 24 hour  Intake    840 ml  Output      0 ml  Net    840 ml    Last BM: 7/11  Labs:   Recent Labs Lab 02/26/14 2040 02/27/14 0821 02/28/14 0514  NA 140 144 142  K 3.9 3.6* 3.7  CL 99 107 106  CO2 22 21 24   BUN 39* 39* 34*  CREATININE 1.60* 1.54* 1.31  CALCIUM 11.6* 10.0 9.0  GLUCOSE 102* 90 104*    CBG (last 3)  No results found for this basename: GLUCAP,  in the last 72 hours  Scheduled Meds: . folic acid  1 mg Oral Daily  . heparin subcutaneous  5,000 Units Subcutaneous 3 times per day  . multivitamin with minerals  1 tablet Oral Daily  .  sodium chloride  3 mL Intravenous Q12H  . thiamine  100 mg Oral Daily    Continuous Infusions:   Past Medical History  Diagnosis Date  . Hypertension   . GERD (gastroesophageal reflux disease)   . Hiatal hernia   . Arthritis     Past Surgical History  Procedure Laterality Date  . Replacement total knee bilateral    . Back surgery    . Inguinal hernia repair    . Colonoscopy      Inda Coke MS, RD, LDN Inpatient Registered Dietitian Pager: [phone] After-hours pager: [phone]

## 2014-02-28 NOTE — Discharge Summary (Signed)
Physician Discharge Summary  [name] URK:[id number] DOB: Jan 07, 1925 DOA: 02/26/2014  PCP: Jani Gravel, MD  Admit date: 02/26/2014 Discharge date: 02/28/2014  Time spent: 30 minutes  Recommendations for Outpatient Follow-up:  1. Follow up with PCP in one week   Discharge Diagnoses:  Principal Problem:   AKI (acute kidney injury) Active Problems:   GERD (gastroesophageal reflux disease)   Lumbar disc disease   Rhabdomyolysis   Chronic low back pain   Elevated troponin   Elevated LFTs   Alcohol use   Acute renal failure   Generalized weakness   Malnutrition of moderate degree   Discharge Condition: improved.   Diet recommendation: regular  Filed Weights   02/27/14 0027  Weight: 66.18 kg (145 lb 14.4 oz)    History of present illness:  [name] is a 80 y.o. Caucasian male with history of hypertension reports that he is not currently on any antihypertensive medications, GERD, arthritis, chronic back pain uses over-the-counter patches, and osteoporosis who presents with fall. He lives currently at [facility] facility, it was noted that he was missing as a result sent people to investigate  And found him on the floor. He reports he was on the floor for almost the whole day. In the emergency department he was found to be in acute renal failure with a creatinine of 1.6 and he was found to have elevated CK of 9829 suggestive of rhabdomyolysis. As a result hospitalist service was asked to admit the patient for further care and management. His troponins were found to be elevated. Cardiology consulted and no new work up needed at this time. His repeat CK levels are much improved today and his renal paramters improved. PT/oTe eval recommended SNF placement. CSW currently working on placement.    Hospital Course:  Acute renal failure  Likely due to dehydration from poor oral intake and from rhabdomyolysis.started him on IV fluids and his renal parameters have improved.    Rhabdomyolysis  Likely due to fall and being on the floor for extended period of time. He was started onIV hydration and his CK has improved. Recommended checking CK again in 2 days.    Elevated troponin  EKG shows no signs of ischemia. Continue aspirin which was started in the emergency department. Suspect elevated troponin is likely related to rhabdomyolysis.  Started the patient on heparin as there is concern for possible NSTEMI.  Cardiology consulted and no new recommendations. Stopped the heparin drip.  Acute on chronic low back pain/fall  Request PT and OT evaluation, recommended SNF placement.  Elevated liver function tests  Likely due to daily alcohol use. Repeated in am.  Alcohol use  Started the patient on CIWA protocol, no withdrawal symptoms please continue Thiamine and folic acid.  Generalized weakness  PT and OT evaluation as indicated above.  GERD  Stable.  Hypertension  Patient indicates that he no longer takes any antihypertensive medications at this time. Stable at this time. Continue to monitor.   Anemia: Normocytic, mild,. Continue to monitor.    Procedures:  Echo  Consultations:  cardiology  Discharge Exam: Filed Vitals:   02/28/14 0926  BP: 113/70  Pulse: 67  Temp: 97.7 F (36.5 C)  Resp: 16    General: alert afebrile comfortable Cardiovascular: s1s2 Respiratory: ctab  Discharge Instructions You were cared for by a hospitalist during your hospital stay. If you have any questions about your discharge medications or the care you received while you were in the hospital after you are discharged,  you can call the unit and asked to speak with the hospitalist on call if the hospitalist that took care of you is not available. Once you are discharged, your primary care physician will handle any further medical issues. Please note that NO REFILLS for any discharge medications will be authorized once you are discharged, as it is imperative that you return to  your primary care physician (or establish a relationship with a primary care physician if you do not have one) for your aftercare needs so that they can reassess your need for medications and monitor your lab values.     Medication List    ASK your doctor about these medications       ALIGN PO  Take 1 tablet by mouth daily.     DEXILANT 60 MG capsule  Generic drug:  dexlansoprazole  Take 60 mg by mouth daily.     lisinopril 5 MG tablet  Commonly known as:  PRINIVIL,ZESTRIL  Take 5 mg by mouth daily.       No Known Allergies    The results of significant diagnostics from this hospitalization (including imaging, microbiology, ancillary and laboratory) are listed below for reference.    Significant Diagnostic Studies: Dg Chest 1 View  02/26/2014   CLINICAL DATA:  Patient found down.  EXAM: CHEST - 1 VIEW  COMPARISON:  PA and lateral chest 01/30/2014.  FINDINGS: Calcified granuloma left lower lobe again seen. The lungs are otherwise clear. Heart size is normal. No pneumothorax or pleural effusion.  IMPRESSION: No acute finding.   Electronically Signed   By: Inge Rise M.D.   On: 02/26/2014 21:35   Dg Lumbar Spine Complete  02/26/2014   CLINICAL DATA:  Fall.  EXAM: LUMBAR SPINE - COMPLETE 4+ VIEW  COMPARISON:  10/23/2008  FINDINGS: There is severe lumbar dextroscoliosis with advanced degenerative disc and facet disease. Bulky osteophytes present along the concavity of the scoliotic curve. These changes have progressed from 2010, and limit the sensitivity of radiography for detecting acute fracture. There is chronic grade 1 anterolisthesis at L5-S1, associated with bilateral pars interarticularis defects. There is no definitive fracture or traumatic subluxation.  IMPRESSION: 1. No acute fracture identified. 2. Dextroscoliosis and advanced lumbar degenerative changes.   Electronically Signed   By: Jorje Guild M.D.   On: 02/26/2014 21:39   Dg Hip Complete Right  02/26/2014    CLINICAL DATA:  Patient found down.  Right hip pain.  EXAM: RIGHT HIP - COMPLETE 2+ VIEW  COMPARISON:  None.  FINDINGS: Both hips are located. Fracture is identified. Lower lumbar degenerative change in convex right scoliosis are partially visualized.  IMPRESSION: No acute abnormality.   Electronically Signed   By: Inge Rise M.D.   On: 02/26/2014 21:40   Ct Head Wo Contrast  02/26/2014   CLINICAL DATA:  Pain post trauma  EXAM: CT HEAD WITHOUT CONTRAST  CT CERVICAL SPINE WITHOUT CONTRAST  TECHNIQUE: Multidetector CT imaging of the head and cervical spine was performed following the standard protocol without intravenous contrast. Multiplanar CT image reconstructions of the cervical spine were also generated.  COMPARISON:  None.  FINDINGS: CT HEAD FINDINGS  There is moderate diffuse atrophy. There is no appreciable mass, hemorrhage, subdural or epidural hematomas, or midline shift. There is mild patchy small vessel disease in the centra semiovale bilaterally. No acute appearing infarct is seen the bony calvarium appears intact. The mastoid air cells are clear. There are retention cysts in each maxillary antrum. There is a  small retention cyst in the left sphenoid sinus. There is mucosal thickening and multiple ethmoid air cells bilaterally. There is mild inferior left frontal sinus disease. There is leftward deviation of the nasal septum.  CT CERVICAL SPINE FINDINGS:  There is no fracture or spondylolisthesis. Prevertebral soft tissues and predental space regions are normal. There is levoscoliosis.  There is mild disc space narrowing at C4-5 and C5-6. There is facet hypertrophy at most levels bilaterally. There is no frank disc extrusion or stenosis. There is calcification in each carotid artery, more on the left than on the right. There is bilateral apical pleural thickening and scarring.  IMPRESSION: CT head: Atrophy with mild periventricular small vessel disease. Paranasal sinus disease. Study otherwise  unremarkable.  CT cervical spine: Multifocal osteoarthritic change. Scoliosis. No fracture or spondylolisthesis. Calcification in each carotid artery, more on the left than on the right.   Electronically Signed   By: Lowella Grip M.D.   On: 02/26/2014 21:25   Ct Cervical Spine Wo Contrast  02/26/2014   CLINICAL DATA:  Pain post trauma  EXAM: CT HEAD WITHOUT CONTRAST  CT CERVICAL SPINE WITHOUT CONTRAST  TECHNIQUE: Multidetector CT imaging of the head and cervical spine was performed following the standard protocol without intravenous contrast. Multiplanar CT image reconstructions of the cervical spine were also generated.  COMPARISON:  None.  FINDINGS: CT HEAD FINDINGS  There is moderate diffuse atrophy. There is no appreciable mass, hemorrhage, subdural or epidural hematomas, or midline shift. There is mild patchy small vessel disease in the centra semiovale bilaterally. No acute appearing infarct is seen the bony calvarium appears intact. The mastoid air cells are clear. There are retention cysts in each maxillary antrum. There is a small retention cyst in the left sphenoid sinus. There is mucosal thickening and multiple ethmoid air cells bilaterally. There is mild inferior left frontal sinus disease. There is leftward deviation of the nasal septum.  CT CERVICAL SPINE FINDINGS:  There is no fracture or spondylolisthesis. Prevertebral soft tissues and predental space regions are normal. There is levoscoliosis.  There is mild disc space narrowing at C4-5 and C5-6. There is facet hypertrophy at most levels bilaterally. There is no frank disc extrusion or stenosis. There is calcification in each carotid artery, more on the left than on the right. There is bilateral apical pleural thickening and scarring.  IMPRESSION: CT head: Atrophy with mild periventricular small vessel disease. Paranasal sinus disease. Study otherwise unremarkable.  CT cervical spine: Multifocal osteoarthritic change. Scoliosis. No fracture  or spondylolisthesis. Calcification in each carotid artery, more on the left than on the right.   Electronically Signed   By: Lowella Grip M.D.   On: 02/26/2014 21:25    Microbiology: Recent Results (from the past 240 hour(s))  MRSA PCR SCREENING     Status: None   Collection Time    02/27/14  1:32 AM      Result Value Ref Range Status   MRSA by PCR NEGATIVE  NEGATIVE Final   Comment:            The GeneXpert MRSA Assay (FDA     approved for NASAL specimens     only), is one component of a     comprehensive MRSA colonization     surveillance program. It is not     intended to diagnose MRSA     infection nor to guide or     monitor treatment for     MRSA infections.  Labs: Basic Metabolic Panel:  Recent Labs Lab 02/26/14 2040 02/27/14 0821 02/28/14 0514  NA 140 144 142  K 3.9 3.6* 3.7  CL 99 107 106  CO2 22 21 24   GLUCOSE 102* 90 104*  BUN 39* 39* 34*  CREATININE 1.60* 1.54* 1.31  CALCIUM 11.6* 10.0 9.0   Liver Function Tests:  Recent Labs Lab 02/26/14 2040 02/27/14 0821  AST 215* 147*  ALT 69* 58*  ALKPHOS 52 44  BILITOT 1.7* 1.0  PROT 7.1 5.9*  ALBUMIN 3.6 2.9*   No results found for this basename: LIPASE, AMYLASE,  in the last 168 hours No results found for this basename: AMMONIA,  in the last 168 hours CBC:  Recent Labs Lab 02/26/14 2040 02/27/14 0821 02/28/14 0514  WBC 12.0* 11.2* 9.7  HGB 13.2 11.2* 10.7*  HCT 38.0* 32.7* 31.7*  MCV 92.7 93.2 93.8  PLT 238 212 194   Cardiac Enzymes:  Recent Labs Lab 02/26/14 2040 02/26/14 2140 02/26/14 2331 02/27/14 0400 02/27/14 0821 02/28/14 0514  CKTOTAL 9829* 9255*  --  5521* 4148* 1598*  CKMB  --  93.3*  --  52.4* 37.7*  --   TROPONINI  --  1.47* 1.01* 1.81* 1.66*  --    BNP: BNP (last 3 results) No results found for this basename: PROBNP,  in the last 8760 hours CBG: No results found for this basename: GLUCAP,  in the last 168 hours     Signed:  Samhitha Rosen  [facility] 02/28/2014, 4:40 PM

## 2014-02-28 NOTE — ED Provider Notes (Signed)
I saw and evaluated the patient, reviewed the resident's note and I agree with the findings and plan.   EKG Interpretation   Date/Time:  Sunday February 26 2014 21:50:06 EDT Ventricular Rate:  64 PR Interval:  156 QRS Duration: 104 QT Interval:  421 QTC Calculation: 434 R Axis:   61 Text Interpretation:  Sinus rhythm Atrial premature complexes Baseline  wander in lead(s) I aVL No significant change since last tracing Confirmed  by Chrystel Barefield  MD, Menno Vanbergen ([id number]) on 02/26/2014 9:55:09 PM      [name] is a 80 y.o. male hx of HTN, GERD, arthritis here with fall. Tried to lay down on the floor yesterday. Was able to get up initially but then ended up on the floor again. Denies syncope but couldn't really tell me how he fell. Chronically ill on exam. Has some lower lumbar tenderness. Weakness bilateral legs. CT head/neck unremarkable. CK 9000, with elevated Cr. Has rhabdo. Trop elevated, and cardiology consulted and felt that it is likely from rhabdo. I held off heparin but gave ASA. Will admit to medicine for hydration and further workup.    Wandra Arthurs, MD 02/28/14 [phone]

## 2014-02-28 NOTE — Clinical Social Work Psychosocial (Signed)
Clinical Social Work Department BRIEF PSYCHOSOCIAL ASSESSMENT 02/28/2014  Patient:  [name], [name]     Account Number:  [id number]     Admit date:  02/26/2014  Clinical Social Worker:  Frederico Hamman  Date/Time:  02/28/2014 04:42 AM  Referred by:  Physician  Date Referred:  02/28/2014 Referred for  SNF Placement   Other Referral:   Interview type:  Other - See comment Other interview type:   CSW talked with Rosendo Gros, SW at [facility] [phone]).    PSYCHOSOCIAL DATA Living Status:  FACILITY Admitted from facility:  [facility] Level of care:  Independent Living Primary support name:  Anselmo Pickler Primary support relationship to patient:  CHILD, ADULT Degree of support available:   Level of support unknown.    CURRENT CONCERNS Current Concerns  Post-Acute Placement   Other Concerns:    SOCIAL WORK ASSESSMENT / PLAN CSW talked with [place] regarding patient and returning to facility. CSW was informed that patient is from [place] and can go to Healthcare faciliy at discharge. Camille requested d/c summary and FL-2.   Assessment/plan status:  No Further Intervention Required Other assessment/ plan:   Information/referral to community resources:   None needed or requested.    PATIENT'S/FAMILY'S RESPONSE TO PLAN OF CARE: Per Rosendo Gros, SW at [facility], patient can return to healthcare facility at discharge.

## 2014-02-28 NOTE — Progress Notes (Signed)
Physical Therapy Treatment Patient Details Name: [name] MRN: [id number] DOB: [birth year] Today's Date: 02/28/2014    History of Present Illness Found on floor at home at [facility]; Acute kidney injury, Rhabdomyolysis    PT Comments    Showing good progress with mobility and progressive ambulation  Follow Up Recommendations  SNF;Supervision/Assistance - 24 hour     Equipment Recommendations  3in1 (PT)    Recommendations for Other Services       Precautions / Restrictions Precautions Precautions: Fall    Mobility  Bed Mobility Overal bed mobility: Needs Assistance Bed Mobility: Supine to Sit     Supine to sit: Min assist;HOB elevated     General bed mobility comments: relied heavily on bedrails  Transfers Overall transfer level: Needs assistance Equipment used: Rolling walker (2 wheeled) Transfers: Sit to/from Stand Sit to Stand: Mod assist         General transfer comment: cues for technique. Assist to rise.  Ambulation/Gait Ambulation/Gait assistance: Min guard Ambulation Distance (Feet): 100 Feet (one seated rest break) Assistive device: Rolling walker (2 wheeled) Gait Pattern/deviations: Step-through pattern;Decreased stride length;Trunk flexed Gait velocity: decr   General Gait Details: Cues to self-monitor for activity tolerance; strikingly kyphotic posture   Stairs            Wheelchair Mobility    Modified Rankin (Stroke Patients Only)       Balance     Sitting balance-Leahy Scale: Fair       Standing balance-Leahy Scale: Poor                      Cognition Arousal/Alertness: Awake/alert Behavior During Therapy: WFL for tasks assessed/performed Overall Cognitive Status: Within Functional Limits for tasks assessed                      Exercises      General Comments        Pertinent Vitals/Pain no apparent distress     Home Living                      Prior Function             PT Goals (current goals can now be found in the care plan section) Acute Rehab PT Goals Patient Stated Goal: get back home and walk the hallway PT Goal Formulation: With patient Time For Goal Achievement: 03/13/14 Potential to Achieve Goals: Good Progress towards PT goals: Progressing toward goals    Frequency  Min 3X/week    PT Plan Current plan remains appropriate    Co-evaluation             End of Session Equipment Utilized During Treatment: Gait belt Activity Tolerance: Patient tolerated treatment well Patient left: in chair;with call bell/phone within reach;with chair alarm set     Time: 1430-1450 PT Time Calculation (min): 20 min  Charges:  $Gait Training: 8-22 mins                    G Codes:      [name] 02/28/2014, 4:35 PM  [name], [name] Pager [phone] Office [phone]

## 2014-03-01 DIAGNOSIS — N179 Acute kidney failure, unspecified: Secondary | ICD-10-CM | POA: Diagnosis not present

## 2014-03-01 DIAGNOSIS — T796XXA Traumatic ischemia of muscle, initial encounter: Secondary | ICD-10-CM

## 2014-03-01 DIAGNOSIS — Z789 Other specified health status: Secondary | ICD-10-CM | POA: Diagnosis not present

## 2014-03-01 LAB — BASIC METABOLIC PANEL
Anion gap: 12 (ref 5–15)
BUN: 26 mg/dL — AB (ref 6–23)
CO2: 25 mEq/L (ref 19–32)
Calcium: 9 mg/dL (ref 8.4–10.5)
Chloride: 101 mEq/L (ref 96–112)
Creatinine, Ser: 1.18 mg/dL (ref 0.50–1.35)
GFR calc Af Amer: 63 mL/min — ABNORMAL LOW (ref >90)
GFR calc non Af Amer: 54 mL/min — ABNORMAL LOW (ref >90)
Glucose, Bld: 95 mg/dL (ref 70–99)
Potassium: 3.9 mEq/L (ref 3.7–5.3)
Sodium: 138 mEq/L (ref 137–147)

## 2014-03-01 LAB — CBC
HEMATOCRIT: 31.4 % — AB (ref 39.0–52.0)
Hemoglobin: 10.9 g/dL — ABNORMAL LOW (ref 13.0–17.0)
MCH: 32.3 pg (ref 26.0–34.0)
MCHC: 34.7 g/dL (ref 30.0–36.0)
MCV: 93.2 fL (ref 78.0–100.0)
Platelets: 202 10*3/uL (ref 150–400)
RBC: 3.37 MIL/uL — ABNORMAL LOW (ref 4.22–5.81)
RDW: 14.2 % (ref 11.5–15.5)
WBC: 8 10*3/uL (ref 4.0–10.5)

## 2014-03-01 LAB — CK: Total CK: 1459 U/L — ABNORMAL HIGH (ref 7–232)

## 2014-03-01 MED ORDER — CALCIUM CARBONATE ANTACID 500 MG PO CHEW
1.0000 | CHEWABLE_TABLET | Freq: Three times a day (TID) | ORAL | Status: DC | PRN
  Administered 2014-03-01: 200 mg via ORAL
  Filled 2014-03-01: qty 1

## 2014-03-01 MED ORDER — SODIUM CHLORIDE 0.9 % IV SOLN
INTRAVENOUS | Status: DC
  Administered 2014-03-01 – 2014-03-02 (×2): via INTRAVENOUS

## 2014-03-01 NOTE — Clinical Social Work Note (Signed)
CSW visited room and talked with patient and daughter [name] regarding discharge. Patient and daughter aware that patient not medically stable for discharge today. Daughter and Mr. Coberly advised that CSW has been in contact with staff at [facility] and has provided needed clinical information. Patient also aware that [facility] will provide his transportation once ready for discharge. Daughter lives out of town so was pleased to know that facility will transport patient.  Arvie Villarruel Givens, MSW, LCSW [phone]

## 2014-03-01 NOTE — Progress Notes (Addendum)
Patient examined- d/c summary reviewed and updated by myself today - awaiting pharmacy to fix med rec prior to doing d/c meds - will need to continue to hydrate today- should be able to d/c tomorrow.

## 2014-03-01 NOTE — Care Management Note (Signed)
CARE MANAGEMENT NOTE 03/01/2014  Patient:  [name], [name]   Account Number:  [id number]  Date Initiated:  03/01/2014  Documentation initiated by:  Ashlen Kiger  Subjective/Objective Assessment:   CM following for progression and d/c planning     Action/Plan:   Met with pt and daughter, IM given, plant o return to [facility] CSW working with pt on plan to  return to facility and appropriate level of care.   Anticipated DC Date:  03/02/2014   Anticipated DC Plan:  SKILLED NURSING FACILITY         Choice offered to / List presented to:             Status of service:  Completed, signed off Medicare Important Message given?  YES (If response is "NO", the following Medicare IM given date fields will be blank) Date Medicare IM given:  03/01/2014 Medicare IM given by:  Garrick Midgley Date Additional Medicare IM given:   Additional Medicare IM given by:    Discharge Disposition:  [place]  Per UR Regulation:    If discussed at Long Length of Stay Meetings, dates discussed:    Comments:

## 2014-03-01 NOTE — Discharge Summary (Addendum)
Physician Discharge Summary  Jabron Weese DGL:[id number] DOB: 1925/03/28 DOA: 02/26/2014  PCP: Jani Gravel, MD  Admit date: 02/26/2014 Discharge date: 03/02/2014  Time spent: 30 minutes  Recommendations for Outpatient Follow-up:  1. Follow up with PCP in one week 2. Ensure he has TEDS on during the day- remove at night  Discharge Diagnoses:  Principal Problem:   AKI (acute kidney injury) Active Problems:   GERD (gastroesophageal reflux disease)   Lumbar disc disease   Rhabdomyolysis   Chronic low back pain   Elevated troponin   Elevated LFTs   Alcohol use   Acute renal failure   Generalized weakness   Malnutrition of moderate degree   Discharge Condition: improved.   Diet recommendation: heart healthy  Filed Weights   03/01/14 0500 03/01/14 2106 03/02/14 0500  Weight: 65.8 kg (145 lb 1 oz) 66.8 kg (147 lb 4.3 oz) 67 kg (147 lb 11.3 oz)    History of present illness:  Conway Fedora is a 80 y.o. Caucasian male with history of hypertension reports that he is not currently on any antihypertensive medications, GERD, arthritis, chronic back pain uses over-the-counter patches, and osteoporosis who presents with fall. He lives currently at [facility] facility in independent living and was found on the floor. He reported he was on the floor for almost the whole day. In the emergency department he was found to be in acute renal failure with a creatinine of 1.6 and an elevated CK of 9829 suggestive of rhabdomyolysis. As a result hospitalist service was asked to admit the patient for further care and management. His troponins were found to be elevated as well and Cardiology was consulted.  Hospital Course:  Acute renal failure  Likely due to dehydration from poor oral intake and from rhabdomyolysis -started him on IV fluids - BUN/Cr improved from 39/1.60 to  to 24/1.20 today- Cr has been stable at this level for the past 3 days.  Rhabdomyolysis  Likely due to fall and being on the floor for  extended period of time.  - with hydration, CK improved from 9829 to 1220  Elevated troponin  EKG showed no signs of ischemia. TNI max was 1.81. The patient was on heparin as there is concern for possible NSTEMI.  Cardiology consulted and felt that elevated Troponin were not secondary to ACS  Suspect it was likely related to rhabdomyolysis.    Acute on chronic low back pain/fall  Requested PT and OT evaluation- recommended SNF placement- he will [facility] re[facility]  Elevated liver function tests  Due to [facility] and also due to daily ETOH use. AST 215 and ALT 69 on admission noted to be improving and 84 and 60 today  Alcohol use  Started the patient on CIWA protocol, no withdrawal symptoms  - he take 2 drinks of rum every day - Thiamine and folic acid ordered but folate level was norma - will not continue these meds on d/c  GERD  Stable.   Hypertension  Patient indicates that he no longer takes any antihypertensive medications at this time. Stable at this time. - BP was actually low on admission likely due to dehydration   Anemia: Normocytic, mild,. Continue to monitor.   Pedal edema - start placing TEDS- suspect he has venous statis as ECHO is normal   Procedures: 02/27/14 Echo- Left ventricle: The cavity size was normal. Systolic function was normal. Wall motion was normal; there were no regional wall motion abnormalities. Left ventricular diastolic function parameters were normal.  Consultations:  cardiology  Discharge Exam: Filed Vitals:   03/02/14 0536  BP: 139/82  Pulse: 67  Temp: 98.5 F (36.9 C)  Resp: 16    General: alert afebrile comfortable Cardiovascular: s1s2 Respiratory: CTA b/l  Extremities: pitting edema at ankles  Discharge Instructions You were cared for by a hospitalist during your hospital stay. If you have any questions about your discharge medications or the care you received while you were in the hospital after you are  discharged, you can call the unit and asked to speak with the hospitalist on call if the hospitalist that took care of you is not available. Once you are discharged, your primary care physician will handle any further medical issues. Please note that NO REFILLS for any discharge medications will be authorized once you are discharged, as it is imperative that you return to your primary care physician (or establish a relationship with a primary care physician if you do not have one) for your aftercare needs so that they can reassess your need for medications and monitor your lab values.     Medication List         calcium carbonate 500 MG chewable tablet  Commonly known as:  TUMS - dosed in mg elemental calcium  Chew 1-2 tablets by mouth as needed for indigestion or heartburn.     neomycin-bacitracin-polymyxin ointment  Commonly known as:  NEOSPORIN  Apply 1 application topically as needed for wound care. apply to eye     vitamin B-12 1000 MCG tablet  Commonly known as:  CYANOCOBALAMIN  Take 1,000 mcg by mouth daily.       No Known Allergies    The results of significant diagnostics from this hospitalization (including imaging, microbiology, ancillary and laboratory) are listed below for reference.    Significant Diagnostic Studies: Dg Chest 1 View  02/26/2014   CLINICAL DATA:  Patient found down.  EXAM: CHEST - 1 VIEW  COMPARISON:  PA and lateral chest 01/30/2014.  FINDINGS: Calcified granuloma left lower lobe again seen. The lungs are otherwise clear. Heart size is normal. No pneumothorax or pleural effusion.  IMPRESSION: No acute finding.   Electronically Signed   By: Inge Rise M.D.   On: 02/26/2014 21:35   Dg Lumbar Spine Complete  02/26/2014   CLINICAL DATA:  Fall.  EXAM: LUMBAR SPINE - COMPLETE 4+ VIEW  COMPARISON:  10/23/2008  FINDINGS: There is severe lumbar dextroscoliosis with advanced degenerative disc and facet disease. Bulky osteophytes present along the concavity of  the scoliotic curve. These changes have progressed from 2010, and limit the sensitivity of radiography for detecting acute fracture. There is chronic grade 1 anterolisthesis at L5-S1, associated with bilateral pars interarticularis defects. There is no definitive fracture or traumatic subluxation.  IMPRESSION: 1. No acute fracture identified. 2. Dextroscoliosis and advanced lumbar degenerative changes.   Electronically Signed   By: Jorje Guild M.D.   On: 02/26/2014 21:39   Dg Hip Complete Right  02/26/2014   CLINICAL DATA:  Patient found down.  Right hip pain.  EXAM: RIGHT HIP - COMPLETE 2+ VIEW  COMPARISON:  None.  FINDINGS: Both hips are located. Fracture is identified. Lower lumbar degenerative change in convex right scoliosis are partially visualized.  IMPRESSION: No acute abnormality.   Electronically Signed   By: Inge Rise M.D.   On: 02/26/2014 21:40   Ct Head Wo Contrast  02/26/2014   CLINICAL DATA:  Pain post trauma  EXAM: CT HEAD WITHOUT CONTRAST  CT  CERVICAL SPINE WITHOUT CONTRAST  TECHNIQUE: Multidetector CT imaging of the head and cervical spine was performed following the standard protocol without intravenous contrast. Multiplanar CT image reconstructions of the cervical spine were also generated.  COMPARISON:  None.  FINDINGS: CT HEAD FINDINGS  There is moderate diffuse atrophy. There is no appreciable mass, hemorrhage, subdural or epidural hematomas, or midline shift. There is mild patchy small vessel disease in the centra semiovale bilaterally. No acute appearing infarct is seen the bony calvarium appears intact. The mastoid air cells are clear. There are retention cysts in each maxillary antrum. There is a small retention cyst in the left sphenoid sinus. There is mucosal thickening and multiple ethmoid air cells bilaterally. There is mild inferior left frontal sinus disease. There is leftward deviation of the nasal septum.  CT CERVICAL SPINE FINDINGS:  There is no fracture or  spondylolisthesis. Prevertebral soft tissues and predental space regions are normal. There is levoscoliosis.  There is mild disc space narrowing at C4-5 and C5-6. There is facet hypertrophy at most levels bilaterally. There is no frank disc extrusion or stenosis. There is calcification in each carotid artery, more on the left than on the right. There is bilateral apical pleural thickening and scarring.  IMPRESSION: CT head: Atrophy with mild periventricular small vessel disease. Paranasal sinus disease. Study otherwise unremarkable.  CT cervical spine: Multifocal osteoarthritic change. Scoliosis. No fracture or spondylolisthesis. Calcification in each carotid artery, more on the left than on the right.   Electronically Signed   By: Lowella Grip M.D.   On: 02/26/2014 21:25   Ct Cervical Spine Wo Contrast  02/26/2014   CLINICAL DATA:  Pain post trauma  EXAM: CT HEAD WITHOUT CONTRAST  CT CERVICAL SPINE WITHOUT CONTRAST  TECHNIQUE: Multidetector CT imaging of the head and cervical spine was performed following the standard protocol without intravenous contrast. Multiplanar CT image reconstructions of the cervical spine were also generated.  COMPARISON:  None.  FINDINGS: CT HEAD FINDINGS  There is moderate diffuse atrophy. There is no appreciable mass, hemorrhage, subdural or epidural hematomas, or midline shift. There is mild patchy small vessel disease in the centra semiovale bilaterally. No acute appearing infarct is seen the bony calvarium appears intact. The mastoid air cells are clear. There are retention cysts in each maxillary antrum. There is a small retention cyst in the left sphenoid sinus. There is mucosal thickening and multiple ethmoid air cells bilaterally. There is mild inferior left frontal sinus disease. There is leftward deviation of the nasal septum.  CT CERVICAL SPINE FINDINGS:  There is no fracture or spondylolisthesis. Prevertebral soft tissues and predental space regions are normal. There  is levoscoliosis.  There is mild disc space narrowing at C4-5 and C5-6. There is facet hypertrophy at most levels bilaterally. There is no frank disc extrusion or stenosis. There is calcification in each carotid artery, more on the left than on the right. There is bilateral apical pleural thickening and scarring.  IMPRESSION: CT head: Atrophy with mild periventricular small vessel disease. Paranasal sinus disease. Study otherwise unremarkable.  CT cervical spine: Multifocal osteoarthritic change. Scoliosis. No fracture or spondylolisthesis. Calcification in each carotid artery, more on the left than on the right.   Electronically Signed   By: Lowella Grip M.D.   On: 02/26/2014 21:25    Microbiology: Recent Results (from the past 240 hour(s))  MRSA PCR SCREENING     Status: None   Collection Time    02/27/14  1:32 AM  Result Value Ref Range Status   MRSA by PCR NEGATIVE  NEGATIVE Final   Comment:            The GeneXpert MRSA Assay (FDA     approved for NASAL specimens     only), is one component of a     comprehensive MRSA colonization     surveillance program. It is not     intended to diagnose MRSA     infection nor to guide or     monitor treatment for     MRSA infections.     Labs: Basic Metabolic Panel:  Recent Labs Lab 02/26/14 2040 02/27/14 0821 02/28/14 0514 03/01/14 0405 03/02/14 0535  NA 140 144 142 138 140  K 3.9 3.6* 3.7 3.9 3.8  CL 99 107 106 101 103  CO2 22 21 24 25 24   GLUCOSE 102* 90 104* 95 92  BUN 39* 39* 34* 26* 24*  CREATININE 1.60* 1.54* 1.31 1.18 1.20  CALCIUM 11.6* 10.0 9.0 9.0 8.6   Liver Function Tests:  Recent Labs Lab 02/26/14 2040 02/27/14 0821 03/02/14 0535  AST 215* 147* 84*  ALT 69* 58* 60*  ALKPHOS 52 44 42  BILITOT 1.7* 1.0 0.6  PROT 7.1 5.9* 5.5*  ALBUMIN 3.6 2.9* 2.5*   No results found for this basename: LIPASE, AMYLASE,  in the last 168 hours No results found for this basename: AMMONIA,  in the last 168  hours CBC:  Recent Labs Lab 02/26/14 2040 02/27/14 0821 02/28/14 0514 03/01/14 0405  WBC 12.0* 11.2* 9.7 8.0  HGB 13.2 11.2* 10.7* 10.9*  HCT 38.0* 32.7* 31.7* 31.4*  MCV 92.7 93.2 93.8 93.2  PLT 238 212 194 202   Cardiac Enzymes:  Recent Labs Lab 02/26/14 2040 02/26/14 2140 02/26/14 2331 02/27/14 0400 02/27/14 0821 02/28/14 0514 03/01/14 0405 03/02/14 0535  CKTOTAL 9829* 9255*  --  5521* 4148* 1598* 1459* 1220*  CKMB  --  93.3*  --  52.4* 37.7*  --   --   --   TROPONINI  --  1.47* 1.01* 1.81* 1.66*  --   --   --    BNP: BNP (last 3 results) No results found for this basename: PROBNP,  in the last 8760 hours CBG: No results found for this basename: GLUCAP,  in the last 168 hours     Signed:  [place]  [facility] 03/02/2014, 10:26 AM

## 2014-03-02 DIAGNOSIS — N179 Acute kidney failure, unspecified: Secondary | ICD-10-CM | POA: Diagnosis not present

## 2014-03-02 DIAGNOSIS — R7989 Other specified abnormal findings of blood chemistry: Secondary | ICD-10-CM | POA: Diagnosis not present

## 2014-03-02 DIAGNOSIS — T796XXA Traumatic ischemia of muscle, initial encounter: Secondary | ICD-10-CM | POA: Diagnosis not present

## 2014-03-02 LAB — COMPREHENSIVE METABOLIC PANEL
ALBUMIN: 2.5 g/dL — AB (ref 3.5–5.2)
ALK PHOS: 42 U/L (ref 39–117)
ALT: 60 U/L — AB (ref 0–53)
AST: 84 U/L — AB (ref 0–37)
Anion gap: 13 (ref 5–15)
BILIRUBIN TOTAL: 0.6 mg/dL (ref 0.3–1.2)
BUN: 24 mg/dL — ABNORMAL HIGH (ref 6–23)
CHLORIDE: 103 meq/L (ref 96–112)
CO2: 24 mEq/L (ref 19–32)
Calcium: 8.6 mg/dL (ref 8.4–10.5)
Creatinine, Ser: 1.2 mg/dL (ref 0.50–1.35)
GFR calc Af Amer: 61 mL/min — ABNORMAL LOW (ref >90)
GFR calc non Af Amer: 53 mL/min — ABNORMAL LOW (ref >90)
Glucose, Bld: 92 mg/dL (ref 70–99)
POTASSIUM: 3.8 meq/L (ref 3.7–5.3)
Sodium: 140 mEq/L (ref 137–147)
Total Protein: 5.5 g/dL — ABNORMAL LOW (ref 6.0–8.3)

## 2014-03-02 LAB — CK: CK TOTAL: 1220 U/L — AB (ref 7–232)

## 2014-03-02 NOTE — Progress Notes (Signed)
Physical Therapy Treatment Patient Details Name: [name] MRN: [id number] DOB: 05-25-1925 Today's Date: 03/02/2014    History of Present Illness Found on floor at home at [facility]; Acute kidney injury, Rhabdomyolysis    PT Comments    Pt making good progress.  Continue to recommend rehab portion of [facility] for continued rehab.  Follow Up Recommendations  SNF;Supervision/Assistance - 24 hour     Equipment Recommendations  3in1 (PT)    Recommendations for Other Services       Precautions / Restrictions Precautions Precautions: Fall Restrictions Weight Bearing Restrictions: No    Mobility  Bed Mobility                  Transfers Overall transfer level: Needs assistance Equipment used: Rolling walker (2 wheeled) Transfers: Sit to/from Stand Sit to Stand: Min assist         General transfer comment: A to scoot forward in chair and to shift weight forward for transition to stand.  Ambulation/Gait Ambulation/Gait assistance: Min guard Ambulation Distance (Feet): 200 Feet Assistive device: Rolling walker (2 wheeled) Gait Pattern/deviations: Trunk flexed;Decreased step length - left Gait velocity: decr   General Gait Details: Pt feels his gait is getting closer to baseline.  Kyphotic posture.   Stairs            Wheelchair Mobility    Modified Rankin (Stroke Patients Only)       Balance           Standing balance support: Bilateral upper extremity supported Standing balance-Leahy Scale: Fair                      Cognition Arousal/Alertness: Awake/alert Behavior During Therapy: WFL for tasks assessed/performed Overall Cognitive Status: Within Functional Limits for tasks assessed                      Exercises      General Comments General comments (skin integrity, edema, etc.): Pt is knowledgeable of seated LE ther ex program and has been doing in room I'ly.      Pertinent Vitals/Pain Denies pain     Home Living                      Prior Function            PT Goals (current goals can now be found in the care plan section) Acute Rehab PT Goals Time For Goal Achievement: 03/13/14 Potential to Achieve Goals: Good Progress towards PT goals: Progressing toward goals;Goals met and updated - see care plan    Frequency  Min 3X/week    PT Plan Current plan remains appropriate    Co-evaluation             End of Session Equipment Utilized During Treatment: Gait belt Activity Tolerance: Patient tolerated treatment well Patient left: in chair;with call bell/phone within reach;with chair alarm set     Time: 7703-4035 PT Time Calculation (min): 18 min  Charges:  $Gait Training: 8-22 mins                    G Codes:      Leili Eskenazi LUBECK 03/02/2014, 10:17 AM

## 2014-03-06 ENCOUNTER — Encounter: Payer: Self-pay | Admitting: Internal Medicine

## 2014-03-06 DIAGNOSIS — M5136 Other intervertebral disc degeneration, lumbar region: Secondary | ICD-10-CM

## 2014-03-06 DIAGNOSIS — M545 Low back pain, unspecified: Secondary | ICD-10-CM

## 2014-03-06 DIAGNOSIS — R7989 Other specified abnormal findings of blood chemistry: Secondary | ICD-10-CM

## 2014-03-06 DIAGNOSIS — M419 Scoliosis, unspecified: Secondary | ICD-10-CM

## 2014-03-06 DIAGNOSIS — D649 Anemia, unspecified: Secondary | ICD-10-CM

## 2014-03-06 DIAGNOSIS — R778 Other specified abnormalities of plasma proteins: Secondary | ICD-10-CM

## 2014-03-06 DIAGNOSIS — G319 Degenerative disease of nervous system, unspecified: Secondary | ICD-10-CM

## 2014-03-06 DIAGNOSIS — R609 Edema, unspecified: Secondary | ICD-10-CM

## 2014-03-06 DIAGNOSIS — R531 Weakness: Secondary | ICD-10-CM

## 2014-03-06 DIAGNOSIS — Z9181 History of falling: Secondary | ICD-10-CM

## 2014-03-06 DIAGNOSIS — N179 Acute kidney failure, unspecified: Secondary | ICD-10-CM

## 2014-03-06 DIAGNOSIS — M6282 Rhabdomyolysis: Secondary | ICD-10-CM

## 2014-03-06 DIAGNOSIS — I679 Cerebrovascular disease, unspecified: Secondary | ICD-10-CM

## 2014-03-06 DIAGNOSIS — M51369 Other intervertebral disc degeneration, lumbar region without mention of lumbar back pain or lower extremity pain: Secondary | ICD-10-CM

## 2014-03-06 HISTORY — DX: Cerebrovascular disease, unspecified: I67.9

## 2014-03-06 HISTORY — DX: Low back pain, unspecified: M54.50

## 2014-03-06 HISTORY — DX: Rhabdomyolysis: M62.82

## 2014-03-06 HISTORY — DX: Other intervertebral disc degeneration, lumbar region: M51.36

## 2014-03-06 HISTORY — DX: Scoliosis, unspecified: M41.9

## 2014-03-06 HISTORY — DX: Edema, unspecified: R60.9

## 2014-03-06 HISTORY — DX: Other intervertebral disc degeneration, lumbar region without mention of lumbar back pain or lower extremity pain: M51.369

## 2014-03-06 HISTORY — DX: History of falling: Z91.81

## 2014-03-06 HISTORY — DX: Other specified abnormalities of plasma proteins: R77.8

## 2014-03-06 HISTORY — DX: Degenerative disease of nervous system, unspecified: G31.9

## 2014-03-06 HISTORY — DX: Weakness: R53.1

## 2014-03-06 HISTORY — DX: Anemia, unspecified: D64.9

## 2014-03-06 HISTORY — DX: Acute kidney failure, unspecified: N17.9

## 2014-03-06 HISTORY — DX: Hypercalcemia: E83.52

## 2014-03-06 HISTORY — DX: Other specified abnormal findings of blood chemistry: R79.89

## 2014-03-07 DIAGNOSIS — M6282 Rhabdomyolysis: Secondary | ICD-10-CM | POA: Diagnosis not present

## 2014-03-07 DIAGNOSIS — M109 Gout, unspecified: Secondary | ICD-10-CM | POA: Diagnosis not present

## 2014-03-07 DIAGNOSIS — M25539 Pain in unspecified wrist: Secondary | ICD-10-CM | POA: Diagnosis not present

## 2014-03-07 DIAGNOSIS — M19049 Primary osteoarthritis, unspecified hand: Secondary | ICD-10-CM | POA: Diagnosis not present

## 2014-03-09 DIAGNOSIS — M109 Gout, unspecified: Secondary | ICD-10-CM | POA: Diagnosis not present

## 2014-07-20 DIAGNOSIS — I10 Essential (primary) hypertension: Secondary | ICD-10-CM | POA: Diagnosis not present

## 2014-07-20 DIAGNOSIS — M1000 Idiopathic gout, unspecified site: Secondary | ICD-10-CM | POA: Diagnosis not present

## 2014-07-27 DIAGNOSIS — M899 Disorder of bone, unspecified: Secondary | ICD-10-CM | POA: Diagnosis not present

## 2014-07-27 DIAGNOSIS — M40209 Unspecified kyphosis, site unspecified: Secondary | ICD-10-CM | POA: Diagnosis not present

## 2014-07-27 DIAGNOSIS — D649 Anemia, unspecified: Secondary | ICD-10-CM | POA: Diagnosis not present

## 2015-01-17 DIAGNOSIS — H2513 Age-related nuclear cataract, bilateral: Secondary | ICD-10-CM | POA: Diagnosis not present

## 2015-01-18 DIAGNOSIS — E780 Pure hypercholesterolemia: Secondary | ICD-10-CM | POA: Diagnosis not present

## 2015-01-18 DIAGNOSIS — M859 Disorder of bone density and structure, unspecified: Secondary | ICD-10-CM | POA: Diagnosis not present

## 2015-01-18 DIAGNOSIS — D649 Anemia, unspecified: Secondary | ICD-10-CM | POA: Diagnosis not present

## 2015-01-25 DIAGNOSIS — N183 Chronic kidney disease, stage 3 (moderate): Secondary | ICD-10-CM | POA: Diagnosis not present

## 2015-01-25 DIAGNOSIS — M949 Disorder of cartilage, unspecified: Secondary | ICD-10-CM | POA: Diagnosis not present

## 2015-01-25 DIAGNOSIS — D649 Anemia, unspecified: Secondary | ICD-10-CM | POA: Diagnosis not present

## 2015-01-25 DIAGNOSIS — I10 Essential (primary) hypertension: Secondary | ICD-10-CM | POA: Diagnosis not present

## 2015-04-02 NOTE — Progress Notes (Signed)
This encounter was created in error - please disregard.  This encounter was created in error - please disregard.

## 2015-05-24 DIAGNOSIS — M859 Disorder of bone density and structure, unspecified: Secondary | ICD-10-CM | POA: Diagnosis not present

## 2015-05-24 DIAGNOSIS — D649 Anemia, unspecified: Secondary | ICD-10-CM | POA: Diagnosis not present

## 2015-05-24 DIAGNOSIS — E7800 Pure hypercholesterolemia, unspecified: Secondary | ICD-10-CM | POA: Diagnosis not present

## 2015-05-24 DIAGNOSIS — Z125 Encounter for screening for malignant neoplasm of prostate: Secondary | ICD-10-CM | POA: Diagnosis not present

## 2015-05-24 DIAGNOSIS — I10 Essential (primary) hypertension: Secondary | ICD-10-CM | POA: Diagnosis not present

## 2015-05-30 DIAGNOSIS — M8580 Other specified disorders of bone density and structure, unspecified site: Secondary | ICD-10-CM | POA: Diagnosis not present

## 2015-05-30 DIAGNOSIS — I10 Essential (primary) hypertension: Secondary | ICD-10-CM | POA: Diagnosis not present

## 2017-01-26 DIAGNOSIS — H5213 Myopia, bilateral: Secondary | ICD-10-CM | POA: Diagnosis not present

## 2017-01-26 DIAGNOSIS — H2513 Age-related nuclear cataract, bilateral: Secondary | ICD-10-CM | POA: Diagnosis not present

## 2017-06-15 ENCOUNTER — Encounter: Payer: Self-pay | Admitting: Physician Assistant

## 2017-06-15 DIAGNOSIS — R079 Chest pain, unspecified: Secondary | ICD-10-CM | POA: Diagnosis not present

## 2017-06-15 DIAGNOSIS — R040 Epistaxis: Secondary | ICD-10-CM | POA: Diagnosis not present

## 2017-06-15 DIAGNOSIS — K625 Hemorrhage of anus and rectum: Secondary | ICD-10-CM | POA: Diagnosis not present

## 2017-06-22 DIAGNOSIS — K625 Hemorrhage of anus and rectum: Secondary | ICD-10-CM | POA: Diagnosis not present

## 2017-06-23 ENCOUNTER — Ambulatory Visit: Payer: Medicare Other | Admitting: Physician Assistant

## 2017-07-20 DIAGNOSIS — R2681 Unsteadiness on feet: Secondary | ICD-10-CM | POA: Diagnosis not present

## 2017-07-20 DIAGNOSIS — N183 Chronic kidney disease, stage 3 (moderate): Secondary | ICD-10-CM | POA: Diagnosis not present

## 2017-07-20 DIAGNOSIS — Z0000 Encounter for general adult medical examination without abnormal findings: Secondary | ICD-10-CM | POA: Diagnosis not present

## 2017-07-20 DIAGNOSIS — K625 Hemorrhage of anus and rectum: Secondary | ICD-10-CM | POA: Diagnosis not present

## 2017-07-20 LAB — BASIC METABOLIC PANEL
BUN: 22 — AB (ref 4–21)
CREATININE: 1.4 — AB (ref 0.6–1.3)
Glucose: 81
POTASSIUM: 4.5 (ref 3.4–5.3)
Sodium: 136 — AB (ref 137–147)

## 2017-07-20 LAB — CBC AND DIFFERENTIAL
HCT: 40 — AB (ref 41–53)
Hemoglobin: 13.4 — AB (ref 13.5–17.5)
Platelets: 242 (ref 150–399)
WBC: 6.4

## 2017-07-20 LAB — HEPATIC FUNCTION PANEL
ALT: 21 (ref 10–40)
AST: 22 (ref 14–40)
Alkaline Phosphatase: 56 (ref 25–125)
Bilirubin, Total: 0.8

## 2017-10-05 ENCOUNTER — Telehealth: Payer: Self-pay | Admitting: Internal Medicine

## 2017-10-05 NOTE — Telephone Encounter (Signed)
We received a New Patient Package to establish care with [name]..Called patient, left message to return call..[name]

## 2017-10-09 ENCOUNTER — Encounter: Payer: Self-pay | Admitting: Internal Medicine

## 2017-11-11 ENCOUNTER — Encounter: Payer: Self-pay | Admitting: Internal Medicine

## 2017-11-11 ENCOUNTER — Non-Acute Institutional Stay: Payer: Medicare Other | Admitting: Internal Medicine

## 2017-11-11 VITALS — BP 132/70 | HR 56 | Temp 98.0°F | Ht 67.25 in | Wt 154.0 lb

## 2017-11-11 DIAGNOSIS — M519 Unspecified thoracic, thoracolumbar and lumbosacral intervertebral disc disorder: Secondary | ICD-10-CM

## 2017-11-11 DIAGNOSIS — N3945 Continuous leakage: Secondary | ICD-10-CM

## 2017-11-11 DIAGNOSIS — R531 Weakness: Secondary | ICD-10-CM | POA: Diagnosis not present

## 2017-11-11 DIAGNOSIS — Z8679 Personal history of other diseases of the circulatory system: Secondary | ICD-10-CM

## 2017-11-11 DIAGNOSIS — M4125 Other idiopathic scoliosis, thoracolumbar region: Secondary | ICD-10-CM

## 2017-11-11 DIAGNOSIS — M810 Age-related osteoporosis without current pathological fracture: Secondary | ICD-10-CM

## 2017-11-11 DIAGNOSIS — I499 Cardiac arrhythmia, unspecified: Secondary | ICD-10-CM

## 2017-11-11 NOTE — Progress Notes (Signed)
Provider:  Rexene Edison. Mariea Clonts, D.O., C.M.D. Location:  Artist of Service:  Clinic (12)  Previous PCP: Gayland Curry, DO Patient Care Team: Gayland Curry, DO as PCP - General (Geriatric Medicine)  Extended Emergency Contact Information Primary Emergency Contact: Ferguson,Debbie F Address: [place], [place] [address] [name] of [place] Phone: [phone] Mobile Phone: [phone] Relation: Daughter Secondary Emergency Contact: Simms,Carol Address: [address]          [place],  [address] [place] Phone: [phone] Work Phone: [phone] Relation: Other  Code Status: full code Goals of Care: Advanced Directive information Advanced Directives 11/11/2017  Does Patient Have a Medical Advance Directive? Yes  Type of Paramedic of [place];Living will  Does patient want to make changes to medical advance directive? No - Patient declined  Copy of [place] in Chart? Yes   Chief Complaint  Patient presents with  . Establish Care    new patient    HPI: Patient is a 84 y.o. male seen today to establish with [facility].  Records have been requested from Dr. Jeneen Rinks Kim--we have one recent note only.  Pt lives in assisted living for the past 1.5 mos--he's lived at [facility] since May 2006.  He moved due to his daughter's concern he would fall in the shower.  He fell 1-2 times before.  No major injury.    He's had chronic back pain with scoliosis and b12 deficiency as only active issues.  Records indicate a h/o cerebrovascular disease, GI bleeding, anemia, HTN, shingles, LFT elevation, GERD, Mallory-Weiss tear, osteoporosis.  He only takes B12 now. He's had both knees replaced after his move to [facility].  No problems with them since.    He had shingles in his mouth--says filling in his teeth felt like they were on fire.  He had redness on his  outside of his face and even in his throat.  He was treated at the urgent care with a shot, took a Rx that resolved it within 3 days.   No postherpetic neuralgia.  Osteoporosis:  He's had some tests at Dr. Julianne Rice office, but he's not sure if he had a bone density.    He says one of his falls required an ED trip to r/o concussion.  Looks like he was admitted for 4 days with kidney failure.    His back is his biggest problem.  His tow sisters have had the same difficulty with scoliosis.   It's been gradually progressive.  He used to fly and scuba dive, but he had to give them both up due to his back.  Gave up flying first b/c he could not make the step up to look into the gas tank of the [place] he flew.  He reports that Dr. Joya Salm had seen him about his back.  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  He was seen by urology for 2 years and participated in the nerve stimulation procedure but it was ineffective.  Meds were also ineffective.  He uses depends.  When he's get out of the water with the tank onto dry ground, his back was too painful.  He also had to give up fishing in [place].  Now he plays with the stockmarket.  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  He is  no longer drinking very much alcohol.  His HCPOA--Carol Bruce.  He requests full code status within reason.  If he had nothing to look forward to, he would not want this.    Has a good appetite.  Says he has to spend most of his time in the dining room eating.  He's skipping breakfast in the dining room--has yogurt and coffee for breakfast.  Weight is up 6 lbs from 4 years ago.  Sleeps well at night.    He's beginning to find things that lost during the move.  His attitude is beginning to change.    Says he's seeing as well as he can.  Has had some problems. Cataract surgery was recommended and he says he's getting along fine as is.    No hearing loss.    He says he  shocks people with his excellent hearing.    He refuses vaccines including his pneumonia vaccines and annual flu shots ever since he was very sick in 1956 after his flu shot.  Educated on fact that since that time, vaccines are no longer live and pts do not get ill due to the vaccine like they did back then.  Still refuses.    He admits to low pulse chronically--says he scares the nursing staff with it at times, but he denies dizziness, weakness, fatigue as a result.  No chest pain or palpitations.  Past Medical History:  Diagnosis Date  . Acute kidney injury ([place]) 03/06/14  . Anemia, unspecified 03/06/14  . Arthritis   . Cerebral atrophy 03/06/14  . Cerebrovascular disease 03/06/14  . Degenerative disc disease, lumbar 03/06/14  . Edema 03/06/14  . GERD (gastroesophageal reflux disease)   . H/O: GI bleed 1986  . Hiatal hernia   . History of fall 03/06/14  . Hypercalcemia 03/06/14  . Hypertension   . Impotence   . LFT elevation 03/06/14  . Lumbago 03/06/14  . Mallory-Weiss tear 1986  . Osteoporosis   . Rhabdomyolysis 03/06/14  . Scoliosis of cervical spine 03/06/14  . Scoliosis of lumbar spine 03/06/14  . Shingles   . Troponin level elevated 03/06/14  . Weak 03/06/14   Past Surgical History:  Procedure Laterality Date  . BACK SURGERY    . clubbed toe repair  2004  . COLONOSCOPY    . INGUINAL HERNIA REPAIR    . REPLACEMENT TOTAL KNEE BILATERAL    . TONSILLECTOMY AND ADENOIDECTOMY  1939  . X-STOP IMPLANTATION      Social History   Socioeconomic History  . Marital status: Divorced    Spouse name: Not on file  . Number of children: 2  . Years of education: Not on file  . Highest education level: Not on file  Occupational History  . Occupation: retired  Scientific laboratory technician  . Financial resource strain: Not on file  . Food insecurity:    Worry: Not on file    Inability: Not on file  . Transportation needs:    Medical: Not on file    Non-medical: Not on file  Tobacco Use  . Smoking  status: Former Research scientist (life sciences)  . Smokeless tobacco: Never Used  Substance and Sexual Activity  . Alcohol use: Yes    Alcohol/week: 7.0 oz    Types: 14 drink(s) per week    Comment: couple of drinks of rum every day  . Drug use: No  . Sexual activity: Not on file  Lifestyle  . Physical activity:    Days per week: Not on  file    Minutes per session: Not on file  . Stress: Not on file  Relationships  . Social connections:    Talks on phone: Not on file    Gets together: Not on file    Attends religious service: Not on file    Active member of club or organization: Not on file    Attends meetings of clubs or organizations: Not on file    Relationship status: Not on file  Other Topics Concern  . Not on file  Social History Narrative   Tobacco use, amount per day now:  NONE   Past tobacco use, amount per day: 1 PACK PER DAY   How many years did you use tobacco: QUIT AGE 70/ 15 YEARS   Alcohol use (drinks per week): 3OZ DAILY/ 2 4OZ GLASSES OF WINE   Diet: REGULAR   Do you drink/eat things with caffeine:   Marital status: DIVORCED              What year were you married?   Do you live in a house, apartment, assisted living, condo, trailer, etc.? [place]    Is it one or more stories?  ONE STORY   How many persons live in your home? MYSELF   Do you have pets in your home?( please list)   Current or past profession: ACCOUNT/EXECUTIVE   Do you exercise?  WALK                                Type and how often?   Do you have a living will? YES   Do you have a DNR form?     NO                              If not, do you want to discuss one?   Do you have signed POA/HPOA forms?   YES                     If so, please bring to you appointment    reports that he has quit smoking. He has never used smokeless tobacco. He reports that he drinks about 7.0 oz of alcohol per week. He reports that he does not use drugs.  Functional Status Survey:  currently independent except med mgt  Family  History  Problem Relation Age of Onset  . Alcoholism Father   . Liver disease Father   . Colon cancer Neg Hx     Health Maintenance  Topic Date Due  . PNA vac Low Risk Adult (1 of 2 - PCV13) 04/01/1992  . INFLUENZA VACCINE  03/18/2017  . TETANUS/TDAP  09/01/2017    Allergies  Allergen Reactions  . Amrix [Cyclobenzaprine]   . Shellfish Allergy     Outpatient Encounter Medications as of 11/11/2017  Medication Sig  . vitamin B-12 (CYANOCOBALAMIN) 1000 MCG tablet Take 1,000 mcg by mouth daily.   No facility-administered encounter medications on file as of 11/11/2017.     Review of Systems  Constitutional: Negative for chills, fever and malaise/fatigue.  HENT: Negative for hearing loss.   Eyes: Negative for blurred vision.  Respiratory: Negative for cough and shortness of breath.   Cardiovascular: Negative for chest pain, palpitations and leg swelling.  Gastrointestinal: Negative for abdominal pain, blood in stool, constipation and melena.  Genitourinary: Negative for dysuria.  Chronic incontinence  Musculoskeletal: Positive for back pain. Negative for joint pain.       Has a short leg d/t his scoliosis  Skin: Negative for itching and rash.  Neurological: Negative for dizziness and loss of consciousness.  Endo/Heme/Allergies: Bruises/bleeds easily.  Psychiatric/Behavioral: Negative for depression.    Vitals:   11/11/17 0959  BP: 132/70  Pulse: (!) 56  Temp: 98 F (36.7 C)  TempSrc: Oral  SpO2: 99%  Weight: 154 lb (69.9 kg)  Height: 5' 7.25" (1.708 m)   Body mass index is 23.94 kg/m. Physical Exam  Constitutional: He is oriented to person, place, and time. He appears well-developed. No distress.  HENT:  Head: Normocephalic and atraumatic.  Eyes: Pupils are equal, round, and reactive to light. EOM are normal.  Neck: Neck supple. No JVD present.  Cardiovascular: Intact distal pulses.  Brady and irregular  Pulmonary/Chest: Effort normal and breath sounds  normal. No respiratory distress.  Abdominal: Bowel sounds are normal.  Musculoskeletal: Normal range of motion.  Short leg, severe scoliosis and kyphosis, walks with walker  Neurological: He is alert and oriented to person, place, and time. No cranial nerve deficit.  Skin: Skin is warm and dry. Capillary refill takes less than 2 seconds.  Psychiatric: He has a normal mood and affect.    Labs reviewed: Basic Metabolic Panel: Recent Labs    07/20/17  NA 136*  K 4.5  BUN 22*  CREATININE 1.4*   Liver Function Tests: Recent Labs    07/20/17  AST 22  ALT 21  ALKPHOS 56   No results for input(s): LIPASE, AMYLASE in the last 8760 hours. No results for input(s): AMMONIA in the last 8760 hours. CBC: Recent Labs    07/20/17  WBC 6.4  HGB 13.4*  HCT 40*  PLT 242   Cardiac Enzymes: No results for input(s): CKTOTAL, CKMB, CKMBINDEX, TROPONINI in the last 8760 hours. BNP: Invalid input(s): POCBNP No results found for: HGBA1C No results found for: TSH No results found for: VITAMINB12 No results found for: FOLATE No results found for: IRON, TIBC, FERRITIN  Imaging and Procedures noted on new patient packet: Colonoscopy normal 08/15/2011 with Dr. Olevia Perches Dexa (see HM) 2014  Assessment/Plan 1. Lumbar disc disease -h/o xstop implantation  -cont salonpas patches, use of walker -may use tylenol prn   2. Generalized weakness -cont rollator walker for support, due to frailty  3. Other idiopathic scoliosis, thoracolumbar region -ongoing, severe, also has DDD and OP -cont salonpas, ambulation with walker, prn tylenol  4. Senile osteoporosis -ongoing, was severe in 2014, no further bone density on file and not on treatment -will discuss vitamins for this next visit--ideally should be on vitamin D 2000 units daily at least plus a medication for this, but suspect he will refuse this  5. Irregular heart beat -ordered EKG to r/o afib--done on nursing unit not in clinic--showed  sinus bradycardia only  6. H/O sinus bradycardia -persists, asymptomatic and not on meds to cause  7. Continuous leakage of urine -since back surgery, cont depends, good hygiene to prevent infections  Labs/tests ordered:  Cbc, cmp, flp, b12, vitamin D, tsh F/u in 6 mos for med mgt  Melanye Hiraldo L. Kissy Cielo, D.O. [facility] [address], [place] [address] Cell Phone (Mon-Fri 8am-5pm):  [phone] On Call:  [phone] & follow prompts after 5pm & weekends Office Phone:  [phone] Office Fax:  [phone]

## 2017-11-12 DIAGNOSIS — E039 Hypothyroidism, unspecified: Secondary | ICD-10-CM | POA: Diagnosis not present

## 2017-11-12 DIAGNOSIS — Z79899 Other long term (current) drug therapy: Secondary | ICD-10-CM | POA: Diagnosis not present

## 2017-11-12 DIAGNOSIS — I10 Essential (primary) hypertension: Secondary | ICD-10-CM | POA: Diagnosis not present

## 2017-11-12 DIAGNOSIS — D649 Anemia, unspecified: Secondary | ICD-10-CM | POA: Diagnosis not present

## 2017-11-12 DIAGNOSIS — E785 Hyperlipidemia, unspecified: Secondary | ICD-10-CM | POA: Diagnosis not present

## 2017-11-20 ENCOUNTER — Encounter: Payer: Self-pay | Admitting: Internal Medicine

## 2017-11-20 DIAGNOSIS — M810 Age-related osteoporosis without current pathological fracture: Secondary | ICD-10-CM | POA: Insufficient documentation

## 2017-11-20 DIAGNOSIS — M4125 Other idiopathic scoliosis, thoracolumbar region: Secondary | ICD-10-CM | POA: Insufficient documentation

## 2017-11-20 DIAGNOSIS — N3945 Continuous leakage: Secondary | ICD-10-CM | POA: Insufficient documentation

## 2017-11-20 DIAGNOSIS — Z8679 Personal history of other diseases of the circulatory system: Secondary | ICD-10-CM | POA: Insufficient documentation

## 2017-12-21 DIAGNOSIS — E039 Hypothyroidism, unspecified: Secondary | ICD-10-CM | POA: Diagnosis not present

## 2017-12-21 DIAGNOSIS — Z1329 Encounter for screening for other suspected endocrine disorder: Secondary | ICD-10-CM | POA: Diagnosis not present

## 2017-12-22 ENCOUNTER — Encounter: Payer: Self-pay | Admitting: Internal Medicine

## 2017-12-29 ENCOUNTER — Non-Acute Institutional Stay: Payer: Medicare Other

## 2017-12-29 DIAGNOSIS — Z0000 Encounter for general adult medical examination without abnormal findings: Secondary | ICD-10-CM | POA: Diagnosis not present

## 2017-12-29 NOTE — Patient Instructions (Addendum)
[name] , Thank you for taking time to come for your Medicare Wellness Visit. I appreciate your ongoing commitment to your health goals. Please review the following plan we discussed and let me know if I can assist you in the future.   Screening recommendations/referrals: Colonoscopy excluded, over age 82 Recommended yearly ophthalmology/optometry visit for glaucoma screening and checkup Recommended yearly dental visit for hygiene and checkup  Vaccinations: Influenza vaccine due, declined Pneumococcal vaccine due, declined Tdap vaccine up to date, due 11/14/2027 Shingles vaccine not in past records    Advanced directives: In chart  Conditions/risks identified: none  Next appointment: Dr. Mariea Clonts 05/12/2018 @ 1:30pm  Preventive Care 82 Years and Older, Male Preventive care refers to lifestyle choices and visits with your health care provider that can promote health and wellness. What does preventive care include?  A yearly physical exam. This is also called an annual well check.  Dental exams once or twice a year.  Routine eye exams. Ask your health care provider how often you should have your eyes checked.  Personal lifestyle choices, including:  Daily care of your teeth and gums.  Regular physical activity.  Eating a healthy diet.  Avoiding tobacco and drug use.  Limiting alcohol use.  Practicing safe sex.  Taking low doses of aspirin every day.  Taking vitamin and mineral supplements as recommended by your health care provider. What happens during an annual well check? The services and screenings done by your health care provider during your annual well check will depend on your age, overall health, lifestyle risk factors, and family history of disease. Counseling  Your health care provider may ask you questions about your:  Alcohol use.  Tobacco use.  Drug use.  Emotional well-being.  Home and relationship well-being.  Sexual activity.  Eating  habits.  History of falls.  Memory and ability to understand (cognition).  Work and work Statistician. Screening  You may have the following tests or measurements:  Height, weight, and BMI.  Blood pressure.  Lipid and cholesterol levels. These may be checked every 5 years, or more frequently if you are over 57 years old.  Skin check.  Lung cancer screening. You may have this screening every year starting at age 74 if you have a 30-pack-year history of smoking and currently smoke or have quit within the past 15 years.  Fecal occult blood test (FOBT) of the stool. You may have this test every year starting at age 89.  Flexible sigmoidoscopy or colonoscopy. You may have a sigmoidoscopy every 5 years or a colonoscopy every 10 years starting at age 54.  Prostate cancer screening. Recommendations will vary depending on your family history and other risks.  Hepatitis C blood test.  Hepatitis B blood test.  Sexually transmitted disease (STD) testing.  Diabetes screening. This is done by checking your blood sugar (glucose) after you have not eaten for a while (fasting). You may have this done every 1-3 years.  Abdominal aortic aneurysm (AAA) screening. You may need this if you are a current or former smoker.  Osteoporosis. You may be screened starting at age 32 if you are at high risk. Talk with your health care provider about your test results, treatment options, and if necessary, the need for more tests. Vaccines  Your health care provider may recommend certain vaccines, such as:  Influenza vaccine. This is recommended every year.  Tetanus, diphtheria, and acellular pertussis (Tdap, Td) vaccine. You may need a Td booster every 10 years.  Zoster vaccine. You may need this after age 61.  Pneumococcal 13-valent conjugate (PCV13) vaccine. One dose is recommended after age 41.  Pneumococcal polysaccharide (PPSV23) vaccine. One dose is recommended after age 64. Talk to your health  care provider about which screenings and vaccines you need and how often you need them. This information is not intended to replace advice given to you by your health care provider. Make sure you discuss any questions you have with your health care provider. Document Released: 08/31/2015 Document Revised: 04/23/2016 Document Reviewed: 06/05/2015 Elsevier Interactive Patient Education  2017 [place] Prevention in the Home Falls can cause injuries. They can happen to people of all ages. There are many things you can do to make your home safe and to help prevent falls. What can I do on the outside of my home?  Regularly fix the edges of walkways and driveways and fix any cracks.  Remove anything that might make you trip as you walk through a door, such as a raised step or threshold.  Trim any bushes or trees on the path to your home.  Use bright outdoor lighting.  Clear any walking paths of anything that might make someone trip, such as rocks or tools.  Regularly check to see if handrails are loose or broken. Make sure that both sides of any steps have handrails.  Any raised decks and porches should have guardrails on the edges.  Have any leaves, snow, or ice cleared regularly.  Use sand or salt on walking paths during winter.  Clean up any spills in your garage right away. This includes oil or grease spills. What can I do in the bathroom?  Use night lights.  Install grab bars by the toilet and in the tub and shower. Do not use towel bars as grab bars.  Use non-skid mats or decals in the tub or shower.  If you need to sit down in the shower, use a plastic, non-slip stool.  Keep the floor dry. Clean up any water that spills on the floor as soon as it happens.  Remove soap buildup in the tub or shower regularly.  Attach bath mats securely with double-sided non-slip rug tape.  Do not have throw rugs and other things on the floor that can make you trip. What can I do  in the bedroom?  Use night lights.  Make sure that you have a light by your bed that is easy to reach.  Do not use any sheets or blankets that are too big for your bed. They should not hang down onto the floor.  Have a firm chair that has side arms. You can use this for support while you get dressed.  Do not have throw rugs and other things on the floor that can make you trip. What can I do in the kitchen?  Clean up any spills right away.  Avoid walking on wet floors.  Keep items that you use a lot in easy-to-reach places.  If you need to reach something above you, use a strong step stool that has a grab bar.  Keep electrical cords out of the way.  Do not use floor polish or wax that makes floors slippery. If you must use wax, use non-skid floor wax.  Do not have throw rugs and other things on the floor that can make you trip. What can I do with my stairs?  Do not leave any items on the stairs.  Make sure that there are  handrails on both sides of the stairs and use them. Fix handrails that are broken or loose. Make sure that handrails are as long as the stairways.  Check any carpeting to make sure that it is firmly attached to the stairs. Fix any carpet that is loose or worn.  Avoid having throw rugs at the top or bottom of the stairs. If you do have throw rugs, attach them to the floor with carpet tape.  Make sure that you have a light switch at the top of the stairs and the bottom of the stairs. If you do not have them, ask someone to add them for you. What else can I do to help prevent falls?  Wear shoes that:  Do not have high heels.  Have rubber bottoms.  Are comfortable and fit you well.  Are closed at the toe. Do not wear sandals.  If you use a stepladder:  Make sure that it is fully opened. Do not climb a closed stepladder.  Make sure that both sides of the stepladder are locked into place.  Ask someone to hold it for you, if possible.  Clearly mark  and make sure that you can see:  Any grab bars or handrails.  First and last steps.  Where the edge of each step is.  Use tools that help you move around (mobility aids) if they are needed. These include:  Canes.  Walkers.  Scooters.  Crutches.  Turn on the lights when you go into a dark area. Replace any light bulbs as soon as they burn out.  Set up your furniture so you have a clear path. Avoid moving your furniture around.  If any of your floors are uneven, fix them.  If there are any pets around you, be aware of where they are.  Review your medicines with your doctor. Some medicines can make you feel dizzy. This can increase your chance of falling. Ask your doctor what other things that you can do to help prevent falls. This information is not intended to replace advice given to you by your health care provider. Make sure you discuss any questions you have with your health care provider. Document Released: 05/31/2009 Document Revised: 01/10/2016 Document Reviewed: 09/08/2014 Elsevier Interactive Patient Education  2017 [employer].

## 2017-12-29 NOTE — Progress Notes (Signed)
Subjective:   [name] is a 84 y.o. male who presents for Medicare Annual/Subsequent preventive examination at [place]  Last AWV-07/27/2014       Objective:    Vitals: BP 130/72 (BP Location: Left Arm, Patient Position: Sitting)   Pulse 60   Temp 98.1 F (36.7 C) (Oral)   Ht 5\' 7"  (1.702 m)   Wt 154 lb (69.9 kg)   BMI 24.12 kg/m   Body mass index is 24.12 kg/m.  Advanced Directives 12/29/2017 11/11/2017 02/27/2014  Does Patient Have a Medical Advance Directive? Yes Yes Patient has advance directive, copy not in chart  Type of Advance Directive [place];Living will [place];Living will [place];Living will  Does patient want to make changes to medical advance directive? No - Patient declined No - Patient declined No change requested  Copy of [place] in Chart? Yes Yes -    Tobacco Social History   Tobacco Use  Smoking Status Former Smoker  . Types: Cigarettes  Smokeless Tobacco Never Used  Tobacco Comment   quit in 1960s     Counseling given: Not Answered Comment: quit in 1960s   Clinical Intake:  Pre-visit preparation completed: No  Pain : No/denies pain     Nutritional Risks: None Diabetes: No  How often do you need to have someone help you when you read instructions, pamphlets, or other written materials from your doctor or pharmacy?: 1 - Never What is the last grade level you completed in school?: Graduate  Interpreter Needed?: No  Information entered by :: Zenia Resides  Past Medical History:  Diagnosis Date  . Acute kidney injury ([facility]) 03/06/14  . Anemia, unspecified 03/06/14  . Arthritis   . BPH (benign prostatic hyperplasia)   . Cerebral atrophy 03/06/14  . Cerebrovascular disease 03/06/14  . Degenerative disc disease, lumbar 03/06/14  . Diverticula, bladder acquired   . Edema 03/06/14  . GERD (gastroesophageal reflux disease)   .  H/O: GI bleed 1986  . Hiatal hernia   . History of fall 03/06/14  . Hypercalcemia 03/06/14  . Hyperlipidemia   . Hypertension   . Impotence   . LFT elevation 03/06/14  . Lumbago 03/06/14  . Mallory-Weiss tear 1986  . Osteoporosis   . Rhabdomyolysis 03/06/14  . Scoliosis of cervical spine 03/06/14  . Scoliosis of lumbar spine 03/06/14  . Shingles   . Spermatocele    bilateral, left greater than right  . Troponin level elevated 03/06/14  . Weak 03/06/14   Past Surgical History:  Procedure Laterality Date  . BACK SURGERY  10/2009   ESI for surgery for lumbar spinal stenosis  . clubbed toe repair  2004  . COLONOSCOPY  08/15/2011  . INGUINAL HERNIA REPAIR Right 2006  . REPLACEMENT TOTAL KNEE BILATERAL  2007, 2008  . TONSILLECTOMY AND ADENOIDECTOMY  1939  . X-STOP IMPLANTATION     Family History  Problem Relation Age of Onset  . Alcoholism Father   . Liver disease Father   . Colon cancer Neg Hx    Social History   Socioeconomic History  . Marital status: Divorced    Spouse name: Not on file  . Number of children: 2  . Years of education: Not on file  . Highest education level: Not on file  Occupational History  . Occupation: retired  Scientific laboratory technician  . Financial resource strain: Not hard at all  . Food insecurity:  Worry: Never true    Inability: Never true  . Transportation needs:    Medical: No    Non-medical: No  Tobacco Use  . Smoking status: Former Smoker    Types: Cigarettes  . Smokeless tobacco: Never Used  . Tobacco comment: quit in 1960s  Substance and Sexual Activity  . Alcohol use: Yes    Alcohol/week: 7.0 oz    Types: 14 Standard drinks or equivalent per week    Comment: couple of drinks of scotch every other day  . Drug use: No  . Sexual activity: Not on file  Lifestyle  . Physical activity:    Days per week: 7 days    Minutes per session: 30 min  . Stress: Not at all  Relationships  . Social connections:    Talks on phone: More than three times  a week    Gets together: More than three times a week    Attends religious service: Never    Active member of club or organization: No    Attends meetings of clubs or organizations: Never    Relationship status: Widowed  Other Topics Concern  . Not on file  Social History Narrative   Tobacco use, amount per day now:  NONE   Past tobacco use, amount per day: 1 PACK PER DAY   How many years did you use tobacco: QUIT AGE 7/ 17 YEARS   Alcohol use (drinks per week): 3OZ DAILY/ 2 4OZ GLASSES OF WINE   Diet: REGULAR   Do you drink/eat things with caffeine:   Marital status: DIVORCED              What year were you married?   Do you live in a house, apartment, assisted living, condo, trailer, etc.? [place]    Is it one or more stories?  ONE STORY   How many persons live in your home? 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  WALK                                Type and how often?   Do you have a living will? YES   Do you have a DNR form?     NO                              If not, do you want to discuss one?   Do you have signed POA/HPOA forms?   YES                     If so, please bring to you appointment    Outpatient Encounter Medications as of 12/29/2017  Medication Sig  . vitamin B-12 (CYANOCOBALAMIN) 1000 MCG tablet Take 1,000 mcg by mouth daily.   No facility-administered encounter medications on file as of 12/29/2017.     Activities of Daily Living In your present state of health, do you have any difficulty performing the following activities: 12/29/2017  Hearing? N  Vision? N  Difficulty concentrating or making decisions? N  Walking or climbing stairs? Y  Dressing or bathing? N  Doing errands, shopping? N  Preparing Food and eating ? N  Using the Toilet? N  In the past six months, have you accidently  leaked urine? Y  Do you have problems  with loss of bowel control? N  Managing your Medications? Y  Managing your Finances? Y  Housekeeping or managing your Housekeeping? Y  Some recent data might be hidden    Patient Care Team: Gayland Curry, DO as PCP - General (Geriatric Medicine)   Assessment:   This is a routine wellness examination for [facility].  Exercise Activities and Dietary recommendations Current Exercise Habits: Home exercise routine, Type of exercise: walking, Time (Minutes): 30, Frequency (Times/Week): 7, Weekly Exercise (Minutes/Week): 210, Intensity: Mild, Exercise limited by: orthopedic condition(s)  Goals    None      Fall Risk Fall Risk  12/29/2017 11/11/2017  Falls in the past year? No No   Is the patient's home free of loose throw rugs in walkways, pet beds, electrical cords, etc?   yes      Grab bars in the bathroom? yes      Handrails on the stairs?   yes      Adequate lighting?   yes  Depression Screen PHQ 2/9 Scores 12/29/2017 11/11/2017  PHQ - 2 Score 0 0    Cognitive Function completed within last year.  MMSE - Mini Mental State Exam 08/20/2017  Orientation to time 4  Orientation to Place 5  Registration 3  Attention/ Calculation 5  Recall 1  Language- name 2 objects 2  Language- repeat 1  Language- follow 3 step command 3  Language- read & follow direction 1  Write a sentence 1  Copy design 1  Total score 27        Immunization History  Administered Date(s) Administered  . Td 09/02/2007  . Tdap 11/13/2017    Qualifies for Shingles Vaccine? Not in past records  Screening Tests Health Maintenance  Topic Date Due  . PNA vac Low Risk Adult (1 of 2 - PCV13) 04/01/1992  . DEXA SCAN  12/30/2014  . INFLUENZA VACCINE  03/18/2018  . TETANUS/TDAP  11/14/2027   Cancer Screenings: Lung: Low Dose CT Chest recommended if Age 70-80 years, 30 pack-year currently smoking OR have quit w/in 15years. Patient does not qualify. Colorectal: up to date  Additional Screenings:    Hepatitis C Screening:declined PNA vaccines due-declined      Plan:  I have personally reviewed and addressed the Medicare Annual Wellness questionnaire and have noted the following in the patient's chart:  A. Medical and social history B. Use of alcohol, tobacco or illicit drugs  C. Current medications and supplements D. Functional ability and status E.  Nutritional status F.  Physical activity G. Advance directives H. List of other physicians I.  Hospitalizations, surgeries, and ER visits in previous 12 months J.  [place] to include hearing, vision, cognitive, depression L. Referrals and appointments - none  In addition, I have reviewed and discussed with patient certain preventive protocols, quality metrics, and best practice recommendations. A written personalized care plan for preventive services as well as general preventive health recommendations were provided to patient.  See attached scanned questionnaire for additional information.   Signed,   Tyson Dense, RN Nurse Health Advisor  Patient Concerns: None

## 2018-01-20 DIAGNOSIS — E038 Other specified hypothyroidism: Secondary | ICD-10-CM | POA: Diagnosis not present

## 2018-01-20 DIAGNOSIS — E039 Hypothyroidism, unspecified: Secondary | ICD-10-CM | POA: Diagnosis not present

## 2018-01-20 LAB — TSH: TSH: 0.66 (ref 0.41–5.90)

## 2018-04-23 DIAGNOSIS — C44319 Basal cell carcinoma of skin of other parts of face: Secondary | ICD-10-CM | POA: Diagnosis not present

## 2018-04-23 DIAGNOSIS — D485 Neoplasm of uncertain behavior of skin: Secondary | ICD-10-CM | POA: Diagnosis not present

## 2018-04-23 DIAGNOSIS — L821 Other seborrheic keratosis: Secondary | ICD-10-CM | POA: Diagnosis not present

## 2018-04-23 DIAGNOSIS — Z85828 Personal history of other malignant neoplasm of skin: Secondary | ICD-10-CM | POA: Diagnosis not present

## 2018-05-12 ENCOUNTER — Non-Acute Institutional Stay: Payer: Medicare Other | Admitting: Internal Medicine

## 2018-05-12 ENCOUNTER — Encounter: Payer: Self-pay | Admitting: Internal Medicine

## 2018-05-12 VITALS — BP 128/70 | HR 69 | Temp 97.7°F | Ht 67.0 in | Wt 162.0 lb

## 2018-05-12 DIAGNOSIS — R531 Weakness: Secondary | ICD-10-CM | POA: Diagnosis not present

## 2018-05-12 DIAGNOSIS — N3945 Continuous leakage: Secondary | ICD-10-CM | POA: Diagnosis not present

## 2018-05-12 DIAGNOSIS — E440 Moderate protein-calorie malnutrition: Secondary | ICD-10-CM | POA: Diagnosis not present

## 2018-05-12 DIAGNOSIS — M4125 Other idiopathic scoliosis, thoracolumbar region: Secondary | ICD-10-CM | POA: Diagnosis not present

## 2018-05-12 DIAGNOSIS — M519 Unspecified thoracic, thoracolumbar and lumbosacral intervertebral disc disorder: Secondary | ICD-10-CM | POA: Diagnosis not present

## 2018-05-12 DIAGNOSIS — D510 Vitamin B12 deficiency anemia due to intrinsic factor deficiency: Secondary | ICD-10-CM

## 2018-05-12 NOTE — Progress Notes (Signed)
Location:  Occupational psychologist of Service:  Clinic (12)  Provider: Travian Kerner L. Mariea Clonts, D.O., C.M.D.  Goals of Care:  Advanced Directives 05/12/2018  Does Patient Have a Medical Advance Directive? Yes  Type of Paramedic of [place];Living will  Does patient want to make changes to medical advance directive? No - Patient declined  Copy of [place] in Chart? Yes     Chief Complaint  Patient presents with  . Medical Management of Chronic Issues    50mth follow-up    HPI: Patient is a 84 y.o. male seen today for medical management of chronic diseases.    Feels like the large salonpas patches work the best for his lower back pain.  Discussed that a tylenol here and there would be safe if he has a bad day.  He sleeps well at night.  Bowels move well--things are on schedule.    Bladder also doing well w/o concern.    Appetite is good--possibly too good.  His weight has trended up another 8 lbs.  He used to weigh 180 and he's careful about what he eats now to keep the weight off.  He's been in good spirits.  Says attitude is good.  He does his own stockwork.  He reads the paper--two of them daily.    He refuses vaccinations for flu and pneumonia.  He has done fairly well cognitively.  Scored 27/30 in may on mmse.    Had elevated TSH in the past, but recheck in June was normal  Past Medical History:  Diagnosis Date  . Acute kidney injury ([place]) 03/06/14  . Anemia, unspecified 03/06/14  . Arthritis   . BPH (benign prostatic hyperplasia)   . Cerebral atrophy 03/06/14  . Cerebrovascular disease 03/06/14  . Degenerative disc disease, lumbar 03/06/14  . Diverticula, bladder acquired   . Edema 03/06/14  . GERD (gastroesophageal reflux disease)   . H/O: GI bleed 1986  . Hiatal hernia   . History of fall 03/06/14  . Hypercalcemia 03/06/14  . Hyperlipidemia   . Hypertension   . Impotence   . LFT elevation 03/06/14  .  Lumbago 03/06/14  . Mallory-Weiss tear 1986  . Osteoporosis   . Rhabdomyolysis 03/06/14  . Scoliosis of cervical spine 03/06/14  . Scoliosis of lumbar spine 03/06/14  . Shingles   . Spermatocele    bilateral, left greater than right  . Troponin level elevated 03/06/14  . Weak 03/06/14    Past Surgical History:  Procedure Laterality Date  . BACK SURGERY  10/2009   ESI for surgery for lumbar spinal stenosis  . clubbed toe repair  2004  . COLONOSCOPY  08/15/2011  . INGUINAL HERNIA REPAIR Right 2006  . REPLACEMENT TOTAL KNEE BILATERAL  2007, 2008  . TONSILLECTOMY AND ADENOIDECTOMY  1939  . X-STOP IMPLANTATION      Allergies  Allergen Reactions  . Amrix [Cyclobenzaprine]   . Shellfish Allergy     Outpatient Encounter Medications as of 05/12/2018  Medication Sig  . ranitidine (ZANTAC) 150 MG tablet Take 150 mg by mouth as needed.  . vitamin B-12 (CYANOCOBALAMIN) 1000 MCG tablet Take 1,000 mcg by mouth daily.   No facility-administered encounter medications on file as of 05/12/2018.     Review of Systems:  Review of Systems  Constitutional: Positive for malaise/fatigue. Negative for chills and fever.       Wt gain, had lost a lot before  HENT: Negative for  congestion.   Eyes: Negative for blurred vision.  Respiratory: Negative for cough and shortness of breath.   Cardiovascular: Negative for chest pain, palpitations and leg swelling.  Gastrointestinal: Negative for abdominal pain, blood in stool, constipation and melena.  Genitourinary: Negative for dysuria.       U incontinence (leakage), wears depends  Musculoskeletal: Positive for back pain and myalgias. Negative for falls and joint pain.  Neurological: Negative for dizziness and loss of consciousness.  Endo/Heme/Allergies: Does not bruise/bleed easily.  Psychiatric/Behavioral: Negative for depression.       [facility]CI    Health Maintenance  Topic Date Due  . PNA vac Low Risk Adult (1 of 2 - PCV13) 04/01/1992  . DEXA SCAN   12/30/2014  . INFLUENZA VACCINE  03/18/2018  . TETANUS/TDAP  11/14/2027    Physical Exam: Vitals:   05/12/18 1333  BP: 128/70  Pulse: 69  Temp: 97.7 F (36.5 C)  TempSrc: Oral  SpO2: 97%  Weight: 162 lb (73.5 kg)  Height: 5\' 7"  (1.702 m)   Body mass index is 25.37 kg/m. Physical Exam  Constitutional: He is oriented to person, place, and time. No distress.  HENT:  Head: Normocephalic and atraumatic.  Cardiovascular: Normal rate, regular rhythm and normal heart sounds.  Pulmonary/Chest: Effort normal and breath sounds normal. No respiratory distress.  Abdominal: Soft. Bowel sounds are normal.  Musculoskeletal:  Chronic kyphoscoliosis and stenosis, ambulates with rollator walker  Neurological: He is alert and oriented to person, place, and time.  Skin: Skin is warm and dry.  Psychiatric: He has a normal mood and affect.    Labs reviewed: Basic Metabolic Panel: Recent Labs    07/20/17 01/20/18 0700  NA 136*  --   K 4.5  --   BUN 22*  --   CREATININE 1.4*  --   TSH  --  0.66   Liver Function Tests: Recent Labs    07/20/17  AST 22  ALT 21  ALKPHOS 56   No results for input(s): LIPASE, AMYLASE in the last 8760 hours. No results for input(s): AMMONIA in the last 8760 hours. CBC: Recent Labs    07/20/17  WBC 6.4  HGB 13.4*  HCT 40*  PLT 242  Assessment/Plan 1. Other idiopathic scoliosis, thoracolumbar region -ongoing, cont prn tylenol, other things don't help much  2. Lumbar disc disease -cont use of rollator walker with seat, may use tylenol for pain, topical also can be implements, heat or ice  3. Generalized weakness -improved with better intake and weight gain  4. Continuous leakage of urine -ongoing, continue depends  5. Malnutrition of moderate degree (HCC) -cont improved diet here at [facility]--seems resolved  6.  Pernicious anemia -cont b12 supplement and zantac -may need to recheck level annually to make sure he is absorbing--if not, needs  shots  Labs/tests ordered:   Orders Placed This Encounter  Procedures  . TSH    This external order was created through the Results Console.   Next appt:  6 mos   Teigen Parslow L. Trevan Messman, D.O. [facility] [address], [place] [address] Cell Phone (Mon-Fri 8am-5pm):  [phone] On Call:  [phone] & follow prompts after 5pm & weekends Office Phone:  [phone] Office Fax:  [phone]

## 2018-05-13 DIAGNOSIS — D649 Anemia, unspecified: Secondary | ICD-10-CM | POA: Diagnosis not present

## 2018-05-13 DIAGNOSIS — D519 Vitamin B12 deficiency anemia, unspecified: Secondary | ICD-10-CM | POA: Diagnosis not present

## 2018-05-13 LAB — VITAMIN B12: Vitamin B-12: 1579

## 2018-05-13 LAB — BASIC METABOLIC PANEL
BUN: 24 — AB (ref 4–21)
Creatinine: 1.6 — AB (ref 0.6–1.3)
Glucose: 89
Potassium: 4.4 (ref 3.4–5.3)
Sodium: 141 (ref 137–147)

## 2018-05-13 LAB — CBC AND DIFFERENTIAL
HCT: 37 — AB (ref 41–53)
Hemoglobin: 12.3 — AB (ref 13.5–17.5)
Platelets: 244 (ref 150–399)
WBC: 6.3

## 2018-05-13 LAB — HEPATIC FUNCTION PANEL
ALT: 15 (ref 10–40)
AST: 18 (ref 14–40)
Alkaline Phosphatase: 79 (ref 25–125)
Bilirubin, Total: 0.7

## 2018-05-18 ENCOUNTER — Encounter: Payer: Self-pay | Admitting: Internal Medicine

## 2018-09-20 DIAGNOSIS — H5213 Myopia, bilateral: Secondary | ICD-10-CM | POA: Diagnosis not present

## 2018-09-20 DIAGNOSIS — H2513 Age-related nuclear cataract, bilateral: Secondary | ICD-10-CM | POA: Diagnosis not present

## 2018-10-18 DIAGNOSIS — Z85828 Personal history of other malignant neoplasm of skin: Secondary | ICD-10-CM | POA: Diagnosis not present

## 2018-10-18 DIAGNOSIS — L570 Actinic keratosis: Secondary | ICD-10-CM | POA: Diagnosis not present

## 2018-11-10 ENCOUNTER — Encounter: Payer: Self-pay | Admitting: Internal Medicine

## 2018-11-10 ENCOUNTER — Non-Acute Institutional Stay: Payer: Medicare Other | Admitting: Internal Medicine

## 2018-11-10 VITALS — BP 100/60 | HR 65 | Temp 97.9°F | Ht 67.0 in | Wt 161.0 lb

## 2018-11-10 DIAGNOSIS — I872 Venous insufficiency (chronic) (peripheral): Secondary | ICD-10-CM | POA: Diagnosis not present

## 2018-11-10 DIAGNOSIS — D510 Vitamin B12 deficiency anemia due to intrinsic factor deficiency: Secondary | ICD-10-CM | POA: Diagnosis not present

## 2018-11-10 DIAGNOSIS — M519 Unspecified thoracic, thoracolumbar and lumbosacral intervertebral disc disorder: Secondary | ICD-10-CM

## 2018-11-10 DIAGNOSIS — N3945 Continuous leakage: Secondary | ICD-10-CM | POA: Diagnosis not present

## 2018-11-10 DIAGNOSIS — H259 Unspecified age-related cataract: Secondary | ICD-10-CM

## 2018-11-10 DIAGNOSIS — M4125 Other idiopathic scoliosis, thoracolumbar region: Secondary | ICD-10-CM

## 2018-11-10 DIAGNOSIS — I10 Essential (primary) hypertension: Secondary | ICD-10-CM | POA: Diagnosis not present

## 2018-11-10 NOTE — Progress Notes (Signed)
Location:  Occupational psychologist of Service:  Clinic (12)  Provider: Stormee Duda L. Mariea Clonts, D.O., C.M.D.  Code Status: full code Goals of Care:  Advanced Directives 05/12/2018  Does Patient Have a Medical Advance Directive? Yes  Type of Paramedic of [place];Living will  Does patient want to make changes to medical advance directive? No - Patient declined  Copy of [place] in Chart? Yes     Chief Complaint  Patient presents with  . Medical Management of Chronic Issues    46mth follow-up    HPI: Patient is a 85 y.o. male seen today for medical management of chronic diseases.    He is doing well.  His only complaint today is about his blurry vision--he's got clouded lenses in his eyes and is for surgery in a few months to replace the lenses.  He is looking forward to it.  His back pain is under good control as long as he doesn't do anything crazy like lift golf clubs into his trunk (no longer golfs living in [place]).    His urinary situation is unchanged with some chronic leakage that persists, but is not worse.   Last labs six months ago indicated worsening of his macrocytic anemia (has had pernicious anemia on B12.  Needs recheck.    His bps had been running from 423N-361W systolic recently the majority of the time when checked in [place] weekly so staff had given them to me to review.  We added amlodipine 5mg  daily.    He is having edema of both legs which has been ongoing.  He does elevate his feet at rest.  He's never worn compression hose.  He is willing to try compression hose.    Past Medical History:  Diagnosis Date  . Acute kidney injury ([place]) 03/06/14  . Anemia, unspecified 03/06/14  . Arthritis   . BPH (benign prostatic hyperplasia)   . Cerebral atrophy ([place]) 03/06/14  . Cerebrovascular disease 03/06/14  . Degenerative disc disease, lumbar 03/06/14  . Diverticula, bladder acquired   . Edema 03/06/14  . GERD  (gastroesophageal reflux disease)   . H/O: GI bleed 1986  . Hiatal hernia   . History of fall 03/06/14  . Hypercalcemia 03/06/14  . Hyperlipidemia   . Hypertension   . Impotence   . LFT elevation 03/06/14  . Lumbago 03/06/14  . Mallory-Weiss tear 1986  . Osteoporosis   . Rhabdomyolysis 03/06/14  . Scoliosis of cervical spine 03/06/14  . Scoliosis of lumbar spine 03/06/14  . Shingles   . Spermatocele    bilateral, left greater than right  . Troponin level elevated 03/06/14  . Weak 03/06/14    Past Surgical History:  Procedure Laterality Date  . BACK SURGERY  10/2009   ESI for surgery for lumbar spinal stenosis  . clubbed toe repair  2004  . COLONOSCOPY  08/15/2011  . INGUINAL HERNIA REPAIR Right 2006  . REPLACEMENT TOTAL KNEE BILATERAL  2007, 2008  . TONSILLECTOMY AND ADENOIDECTOMY  1939  . X-STOP IMPLANTATION      Allergies  Allergen Reactions  . Amrix [Cyclobenzaprine]   . Shellfish Allergy     Outpatient Encounter Medications as of 11/10/2018  Medication Sig  . acetaminophen (TYLENOL) 325 MG tablet Take 650 mg by mouth every 4 (four) hours as needed.  Marland Kitchen amLODipine (NORVASC) 5 MG tablet Take 1 tablet by mouth daily.  . DUREZOL 0.05 % EMUL Place 1 drop into both  eyes 4 (four) times daily.  Marland Kitchen moxifloxacin (VIGAMOX) 0.5 % ophthalmic solution Place 1 drop into both eyes 4 (four) times daily.  . ranitidine (ZANTAC) 150 MG tablet Take 150 mg by mouth as needed.  . vitamin B-12 (CYANOCOBALAMIN) 1000 MCG tablet Take 1,000 mcg by mouth daily.   No facility-administered encounter medications on file as of 11/10/2018.     Review of Systems:  Review of Systems  Constitutional: Positive for malaise/fatigue. Negative for chills and fever.  HENT: Negative for congestion.   Eyes: Positive for blurred vision.  Respiratory: Negative for cough and shortness of breath.   Cardiovascular: Negative for chest pain, palpitations and leg swelling.  Gastrointestinal: Negative for abdominal  pain, blood in stool, constipation, diarrhea and melena.  Genitourinary: Negative for dysuria.       Chronic leakage of urine  Musculoskeletal: Positive for back pain. Negative for falls, joint pain and myalgias.       Kyphoscoliosis  Neurological: Negative for dizziness and loss of consciousness.  Psychiatric/Behavioral: Positive for memory loss. Negative for depression. The patient is not nervous/anxious and does not have insomnia.        Very mild cognitive loss    Health Maintenance  Topic Date Due  . PNA vac Low Risk Adult (1 of 2 - PCV13) 04/01/1992  . DEXA SCAN  12/30/2014  . TETANUS/TDAP  11/14/2027  . INFLUENZA VACCINE  Discontinued    Physical Exam: Vitals:   11/10/18 1328  BP: 100/60  Pulse: 65  Temp: 97.9 F (36.6 C)  TempSrc: Oral  SpO2: 99%  Weight: 161 lb (73 kg)  Height: 5\' 7"  (1.702 m)   Body mass index is 25.22 kg/m. Physical Exam Vitals signs reviewed.  Constitutional:      General: He is not in acute distress.    Appearance: Normal appearance. He is not ill-appearing or toxic-appearing.  HENT:     Head: Normocephalic and atraumatic.  Cardiovascular:     Rate and Rhythm: Normal rate and regular rhythm.     Pulses: Normal pulses.     Heart sounds: Normal heart sounds.  Pulmonary:     Effort: Pulmonary effort is normal.     Breath sounds: Normal breath sounds.  Abdominal:     General: Bowel sounds are normal.  Musculoskeletal: Normal range of motion.     Right lower leg: Edema present.     Left lower leg: Edema present.     Comments: Shuffling gait, stooped posture with severe kyphoscoliosis; pitting edema bilateral feet and ankles  Skin:    General: Skin is warm and dry.  Neurological:     General: No focal deficit present.     Mental Status: He is alert and oriented to person, place, and time. Mental status is at baseline.  Psychiatric:        Mood and Affect: Mood normal.     Labs reviewed: Basic Metabolic Panel: Recent Labs     01/20/18 0700 05/13/18 0100  NA  --  141  K  --  4.4  BUN  --  24*  CREATININE  --  1.6*  TSH 0.66  --    Liver Function Tests: Recent Labs    05/13/18 0100  AST 18  ALT 15  ALKPHOS 79   No results for input(s): LIPASE, AMYLASE in the last 8760 hours. No results for input(s): AMMONIA in the last 8760 hours. CBC: Recent Labs    05/13/18 0100  WBC 6.3  HGB 12.3*  HCT  37*  PLT 244   Lipid Panel: No results for input(s): CHOL, HDL, LDLCALC, TRIG, CHOLHDL, LDLDIRECT in the last 8760 hours. No results found for: HGBA1C  Assessment/Plan 1. Other idiopathic scoliosis, thoracolumbar region -ongoing, no changes in pain due to no aggressive activity performed while in [place]  2. Lumbar disc disease -some ongoing chronic pain is well controlled with current regimen  3. Continuous leakage of urine -ongoing, meds will not help this issue  4. Pernicious anemia -f/u cbc due to worsening anemia last labs six months ago -cont b12  5. Age-related cataract of both eyes, unspecified age-related cataract type -keep future appt for cataract surgery  6. Venous insufficiency of both lower extremities -begin compression hose to knees on in days off at night  7.  HTN:  bp is now well controlled with 5mg  amlodipine -edema was issue before med started -no dizziness -bp to be checked weekly manually to see where it is and results given to me -if running low, will decrease to 2.5mg  daily  Labs/tests ordered:  Cbc, cmp in am Next appt:  6 mos med mgt  [name], D.O. [facility] [address], [place] [address] Cell Phone (Mon-Fri 8am-5pm):  [phone] On Call:  [phone] & follow prompts after 5pm & weekends Office Phone:  [phone] Office Fax:  [phone]

## 2018-11-11 DIAGNOSIS — D649 Anemia, unspecified: Secondary | ICD-10-CM | POA: Diagnosis not present

## 2018-11-11 DIAGNOSIS — I10 Essential (primary) hypertension: Secondary | ICD-10-CM | POA: Diagnosis not present

## 2018-11-11 DIAGNOSIS — D519 Vitamin B12 deficiency anemia, unspecified: Secondary | ICD-10-CM | POA: Diagnosis not present

## 2018-12-12 ENCOUNTER — Encounter: Payer: Self-pay | Admitting: Internal Medicine

## 2018-12-28 DIAGNOSIS — B351 Tinea unguium: Secondary | ICD-10-CM | POA: Diagnosis not present

## 2018-12-28 DIAGNOSIS — M79671 Pain in right foot: Secondary | ICD-10-CM | POA: Diagnosis not present

## 2018-12-28 DIAGNOSIS — Q6689 Other  specified congenital deformities of feet: Secondary | ICD-10-CM | POA: Diagnosis not present

## 2018-12-28 DIAGNOSIS — M79672 Pain in left foot: Secondary | ICD-10-CM | POA: Diagnosis not present

## 2019-01-26 DIAGNOSIS — Z20828 Contact with and (suspected) exposure to other viral communicable diseases: Secondary | ICD-10-CM | POA: Diagnosis not present

## 2019-02-01 DIAGNOSIS — R4182 Altered mental status, unspecified: Secondary | ICD-10-CM | POA: Diagnosis not present

## 2019-02-01 DIAGNOSIS — M545 Low back pain: Secondary | ICD-10-CM | POA: Diagnosis not present

## 2019-02-01 DIAGNOSIS — N39498 Other specified urinary incontinence: Secondary | ICD-10-CM | POA: Diagnosis not present

## 2019-02-01 DIAGNOSIS — R319 Hematuria, unspecified: Secondary | ICD-10-CM | POA: Diagnosis not present

## 2019-02-01 DIAGNOSIS — N390 Urinary tract infection, site not specified: Secondary | ICD-10-CM | POA: Diagnosis not present

## 2019-02-03 ENCOUNTER — Non-Acute Institutional Stay: Payer: Medicare Other | Admitting: Adult Health

## 2019-02-03 ENCOUNTER — Encounter: Payer: Self-pay | Admitting: Adult Health

## 2019-02-03 DIAGNOSIS — R310 Gross hematuria: Secondary | ICD-10-CM | POA: Diagnosis not present

## 2019-02-03 DIAGNOSIS — R42 Dizziness and giddiness: Secondary | ICD-10-CM | POA: Diagnosis not present

## 2019-02-03 DIAGNOSIS — R319 Hematuria, unspecified: Secondary | ICD-10-CM | POA: Diagnosis not present

## 2019-02-03 DIAGNOSIS — N183 Chronic kidney disease, stage 3 unspecified: Secondary | ICD-10-CM | POA: Insufficient documentation

## 2019-02-03 DIAGNOSIS — L853 Xerosis cutis: Secondary | ICD-10-CM

## 2019-02-03 DIAGNOSIS — M519 Unspecified thoracic, thoracolumbar and lumbosacral intervertebral disc disorder: Secondary | ICD-10-CM

## 2019-02-03 NOTE — Progress Notes (Addendum)
Location:  Occupational psychologist of Service:   ALF Provider:   Cindi Carbon, ANP [place] [phone]  Gayland Curry, DO  Patient Care Team: Gayland Curry, DO as PCP - General (Geriatric Medicine)  Extended Emergency Contact Information Primary Emergency Contact: Rudean Hitt Address: [place], [place] [address] [name] of [place] Phone: [phone] Mobile Phone: [phone] Relation: Daughter Secondary Emergency Contact: Simms,Carol Address: [address]          [place],  [address] [place] Phone: [phone] Work Phone: [phone] Relation: Other  Code Status:  Full code Goals of care: Advanced Directive information Advanced Directives 05/12/2018  Does Patient Have a Medical Advance Directive? Yes  Type of Paramedic of [place];Living will  Does patient want to make changes to medical advance directive? No - Patient declined  Copy of [place] in Chart? Yes     Chief Complaint  Patient presents with   Acute Visit    hematuria, dizziness, rash, low appetite, states "i am dying"    HPI:  Pt is a 85 y.o. male seen today for the above listed complaints. He lives in assisted living and walks with a walker. His nurse reports this morning that he had blood in his urine. The patient felt that it was in his stool. 3-4 days ago his back pain was getting worse in the low back area which is chronic due to scoliosis and degenerative changes. He has been using tylenol for this reason which has helped. He has not had a fever, dysuria, bladder pain etc. He reports low appetite denies n, v, d, or abd pain. He has chronic hernia to his abd. He reports that he is incontinent if urine after back surgery and that his bladder habits are unchanged. A urine was ordered earlier this week which was unremarkable but was possibly a  contaminated specimen based on the skin flora noted.  He has also been reporting dizziness. He is evasive when asked questions and could not elaborate further on this problem. He does not  Drink water regularly and so the nurses have been encouraging more fluids. His urinary and bowel out put amts are normal per staff and resident. BP has been in the 130-150 range but this is an the automatic cuff.  He has been reporting for several days that he is dying. I told him that test would need to ordered to evaluate his health and he repeated "I won't be here tomorrow because I am dying".  He denies feeling anxious or depressed. He sleeps at night well. Denies SI.  We have clarified that he is a full code with his POA. He is not willing to address this issue and defers to his POA. MMSE 27/30 with failed clock in Feb 2020  He also has dry itchy skin to his back   Past Medical History:  Diagnosis Date   Acute kidney injury ([place]) 03/06/14   Anemia, unspecified 03/06/14   Arthritis    BPH (benign prostatic hyperplasia)    Cerebral atrophy ([place]) 03/06/14   Cerebrovascular disease 03/06/14   Degenerative disc disease, lumbar 03/06/14   Diverticula, bladder acquired    Edema 03/06/14   GERD (gastroesophageal reflux disease)    H/O: GI bleed 1986   Hiatal hernia    History of fall 03/06/14   Hypercalcemia 03/06/14  Hyperlipidemia    Hypertension    Impotence    LFT elevation 03/06/14   Lumbago 03/06/14   Mallory-Weiss tear 1986   Osteoporosis    Rhabdomyolysis 03/06/14   Scoliosis of cervical spine 03/06/14   Scoliosis of lumbar spine 03/06/14   Shingles    Spermatocele    bilateral, left greater than right   Troponin level elevated 03/06/14   Weak 03/06/14   Past Surgical History:  Procedure Laterality Date   BACK SURGERY  10/2009   ESI for surgery for lumbar spinal stenosis   clubbed toe repair  2004   COLONOSCOPY  08/15/2011   INGUINAL HERNIA REPAIR Right 2006    REPLACEMENT TOTAL KNEE BILATERAL  2007, 2008   TONSILLECTOMY AND ADENOIDECTOMY  1939   X-STOP IMPLANTATION      Allergies  Allergen Reactions   Amrix [Cyclobenzaprine]    Shellfish Allergy     Outpatient Encounter Medications as of 02/03/2019  Medication Sig   acetaminophen (TYLENOL) 325 MG tablet Take 650 mg by mouth every 4 (four) hours as needed.   acetaminophen (TYLENOL) 325 MG tablet Take 650 mg by mouth daily.   amLODipine (NORVASC) 5 MG tablet Take 1 tablet by mouth daily.   famotidine (PEPCID) 20 MG tablet Take 20 mg by mouth daily as needed for heartburn or indigestion.   vitamin B-12 (CYANOCOBALAMIN) 1000 MCG tablet Take 1,000 mcg by mouth daily.   [DISCONTINUED] DUREZOL 0.05 % EMUL Place 1 drop into both eyes 4 (four) times daily.   [DISCONTINUED] moxifloxacin (VIGAMOX) 0.5 % ophthalmic solution Place 1 drop into both eyes 4 (four) times daily.   [DISCONTINUED] ranitidine (ZANTAC) 150 MG tablet Take 150 mg by mouth as needed.   No facility-administered encounter medications on file as of 02/03/2019.     Review of Systems  Constitutional: Positive for appetite change. Negative for activity change, chills, diaphoresis, fatigue, fever and unexpected weight change.  HENT: Negative for congestion, dental problem, ear discharge and ear pain.   Eyes: Negative for visual disturbance.  Respiratory: Negative for cough, shortness of breath, wheezing and stridor.   Cardiovascular: Negative for chest pain, palpitations and leg swelling.  Gastrointestinal: Negative for abdominal distention, abdominal pain, anal bleeding, blood in stool, constipation, diarrhea, nausea, rectal pain and vomiting.  Genitourinary: Positive for hematuria. Negative for decreased urine volume, difficulty urinating, discharge, dysuria, flank pain, frequency, genital sores, penile pain, penile swelling, scrotal swelling, testicular pain and urgency.  Musculoskeletal: Positive for arthralgias, back pain  and gait problem. Negative for joint swelling, myalgias, neck pain and neck stiffness.  Skin: Positive for rash. Negative for wound.  Neurological: Negative for dizziness, seizures, syncope, facial asymmetry, speech difficulty, weakness and headaches.  Hematological: Negative for adenopathy. Does not bruise/bleed easily.  Psychiatric/Behavioral: Positive for dysphoric mood. Negative for agitation, behavioral problems, confusion, decreased concentration, hallucinations, self-injury, sleep disturbance and suicidal ideas. The patient is not nervous/anxious and is not hyperactive.     Immunization History  Administered Date(s) Administered   Td 09/02/2007   Tdap 11/11/2017   Pertinent  Health Maintenance Due  Topic Date Due   PNA vac Low Risk Adult (1 of 2 - PCV13) 04/01/1992   DEXA SCAN  12/30/2014   INFLUENZA VACCINE  Discontinued   Fall Risk  05/12/2018 12/29/2017 11/11/2017  Falls in the past year? No No No   Functional Status Survey:    Vitals:   02/03/19 1556  BP: (!) 157/74  Pulse: 65  Resp: 18  Temp: 97.6 F (  36.4 C)  SpO2: 94%   There is no height or weight on file to calculate BMI. Physical Exam Vitals signs and nursing note reviewed.  Constitutional:      General: He is not in acute distress.    Appearance: He is not diaphoretic.  HENT:     Head: Normocephalic and atraumatic.     Right Ear: Tympanic membrane and ear canal normal.     Left Ear: Tympanic membrane and ear canal normal.     Nose: Nose normal. No congestion.     Mouth/Throat:     Mouth: Mucous membranes are moist.     Pharynx: Oropharynx is clear. No oropharyngeal exudate.  Eyes:     General:        Right eye: No discharge.     Extraocular Movements: Extraocular movements intact.     Conjunctiva/sclera: Conjunctivae normal.     Pupils: Pupils are equal, round, and reactive to light.  Neck:     Thyroid: No thyromegaly.     Vascular: No carotid bruit or JVD.     Trachea: No tracheal  deviation.  Cardiovascular:     Rate and Rhythm: Normal rate and regular rhythm.     Heart sounds: No murmur.  Pulmonary:     Effort: Pulmonary effort is normal. No respiratory distress.     Breath sounds: Normal breath sounds. No wheezing.  Abdominal:     General: Bowel sounds are normal. There is no distension.     Palpations: Abdomen is soft.     Tenderness: There is no abdominal tenderness.     Hernia: A hernia is present.    Genitourinary:    Penis: Normal.      Scrotum/Testes: Normal.     Prostate: Enlarged. Not tender and no nodules present.     Rectum: Normal.  Musculoskeletal:        General: Deformity (curved and kyphotic spine not tender. ) present. No swelling, tenderness or signs of injury.     Right lower leg: No edema.     Left lower leg: No edema.  Lymphadenopathy:     Cervical: No cervical adenopathy.  Skin:    General: Skin is warm and dry.     Findings: Rash (macular rash to lower back bilateral with pustules) present.  Neurological:     General: No focal deficit present.     Mental Status: He is alert and oriented to person, place, and time. Mental status is at baseline.     Cranial Nerves: No cranial nerve deficit.  Psychiatric:     Comments: flat     Labs reviewed: Recent Labs    05/13/18 0100  NA 141  K 4.4  BUN 24*  CREATININE 1.6*   Recent Labs    05/13/18 0100  AST 18  ALT 15  ALKPHOS 79   Recent Labs    05/13/18 0100  WBC 6.3  HGB 12.3*  HCT 37*  PLT 244   Lab Results  Component Value Date   TSH 0.66 01/20/2018   No results found for: HGBA1C No results found for: CHOL, HDL, LDLCALC, LDLDIRECT, TRIG, CHOLHDL  Significant Diagnostic Results in last 30 days:  No results found.  Assessment/Plan 1. Gross hematuria Recollect UA C and S Check flat place of the abd for stones, possibly add renal ultrasound or send for CT of the abd if no improvement.   Monitor vital signs Check labs  Encourage fluid  2.  Dizziness Decrease norvasc to 2.5  mg qd Check orthostatics in the morning   3. Xerosis of skin Triamcinolone 0.1% bid x 7 days and qd prn  4. Lumbar disc disease Schedule tylenol 650 mg qd and continue prn dosing due to worsening of chronic low back pain. Consider additional measure once work up complete if no improvement   5. CKD (chronic kidney disease), stage III ([place]) Continue to periodically monitor BMP and avoid nephrotoxic agents  He has a flat affect and his mood is dysphoric with thoughts of dying. He does not appear at risk for self harm. Symptoms have only been present a few days. Will have him follow up with Dr. Mariea Clonts in the clinic regarding this issue, along with his issues of hematuria, ACP follow up discussion, and back pain.  He does not appear toxic and the bleeding is mild in nature at this point. Will continue to monitor closely.     Family/ staff Communication: discussed with Dr. Mariea Clonts. Will follow up with call to POA with test results tomorrow   Labs/tests ordered:  CBC, TSH, CMP, PSA, UA C and S, KUB

## 2019-02-04 ENCOUNTER — Telehealth: Payer: Self-pay | Admitting: Adult Health

## 2019-02-04 DIAGNOSIS — Z79899 Other long term (current) drug therapy: Secondary | ICD-10-CM | POA: Diagnosis not present

## 2019-02-04 DIAGNOSIS — N390 Urinary tract infection, site not specified: Secondary | ICD-10-CM | POA: Diagnosis not present

## 2019-02-04 DIAGNOSIS — R319 Hematuria, unspecified: Secondary | ICD-10-CM | POA: Diagnosis not present

## 2019-02-04 NOTE — Telephone Encounter (Signed)
Nurse notified me at [facility] that [name] refused labs this morning. He did allow the UA to be obtained which returned showing WBC TNC.  I ordered Cipro 250 mg bid for 7 days with Florastor while we await the final culture for treatment of UTI with hematuria. His KUB did not show any acute abnormality. The nurse reports that he initially refused the antibiotic but after be counseled further by the nurse he agreed to do so.

## 2019-02-07 DIAGNOSIS — R42 Dizziness and giddiness: Secondary | ICD-10-CM | POA: Diagnosis not present

## 2019-02-07 DIAGNOSIS — R350 Frequency of micturition: Secondary | ICD-10-CM | POA: Diagnosis not present

## 2019-02-07 DIAGNOSIS — I10 Essential (primary) hypertension: Secondary | ICD-10-CM | POA: Diagnosis not present

## 2019-02-07 DIAGNOSIS — M545 Low back pain: Secondary | ICD-10-CM | POA: Diagnosis not present

## 2019-02-07 DIAGNOSIS — D649 Anemia, unspecified: Secondary | ICD-10-CM | POA: Diagnosis not present

## 2019-02-07 DIAGNOSIS — E039 Hypothyroidism, unspecified: Secondary | ICD-10-CM | POA: Diagnosis not present

## 2019-02-08 LAB — CBC AND DIFFERENTIAL
HCT: 36 — AB (ref 41–53)
Hemoglobin: 12.4 — AB (ref 13.5–17.5)
Neutrophils Absolute: 4
Platelets: 369 (ref 150–399)
WBC: 7.7

## 2019-02-08 LAB — HEPATIC FUNCTION PANEL
ALT: 26 (ref 10–40)
AST: 32 (ref 14–40)
Alkaline Phosphatase: 81 (ref 25–125)
Bilirubin, Total: 0.4

## 2019-02-08 LAB — BASIC METABOLIC PANEL
BUN: 24 — AB (ref 4–21)
Creatinine: 1.8 — AB (ref 0.6–1.3)
Glucose: 91
Potassium: 3.7 (ref 3.4–5.3)
Sodium: 133 — AB (ref 137–147)

## 2019-02-08 LAB — PSA: PSA: 3.63

## 2019-02-08 LAB — TSH: TSH: 5.56 (ref 0.41–5.90)

## 2019-02-10 ENCOUNTER — Non-Acute Institutional Stay: Payer: Medicare Other | Admitting: Adult Health

## 2019-02-10 ENCOUNTER — Encounter: Payer: Self-pay | Admitting: Adult Health

## 2019-02-10 DIAGNOSIS — N3001 Acute cystitis with hematuria: Secondary | ICD-10-CM

## 2019-02-10 DIAGNOSIS — Z7189 Other specified counseling: Secondary | ICD-10-CM

## 2019-02-10 DIAGNOSIS — R410 Disorientation, unspecified: Secondary | ICD-10-CM
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
## 2019-02-10 NOTE — ACP (Advance Care Planning) (Signed)
I spoke with his [place]. I explained that [name] is experiencing delirium and is not eating and drinking. He has been treated for a UTI but has continued to decline in terms of mentation. He does have underlying memory loss and there also is concern for depression given his comments about death recently. He is 85 years old and his family is concerned about how traumatic a transfer to the hospital would be. Ms. [name] discussed his care with her daughter and they have decided to make him a DNR and avoid hospitalizations. If his behavior does not improve with haldol I let her know that we would have to send him to the ER for his safety as well as the staff. We discussed hospice if he does not improve over the weekend.

## 2019-02-11 ENCOUNTER — Telehealth: Payer: Self-pay | Admitting: Adult Health

## 2019-02-11 MED ORDER — LORAZEPAM 0.5 MG PO TABS: 0.5000 mg | ORAL_TABLET | ORAL | 1 refills | Status: DC | PRN

## 2019-02-11 NOTE — Telephone Encounter (Signed)
I spoke with [name], the nursing supervisor at [facility]. She indicated that [name] expressed that he was dying and did not want to eat or drink. He remains confused at times and is periodically refusing food and fluids. His family is at the bedside and in agreement with comfort care. [name] is also agitated at times, hitting, kicking, and also experiencing hallucinations and delusions. The staff are requesting something for agitation in addition to haldol which is IM. Will prescribed oral ativan 0.5 mg q 4 hrs prn. Hospice consult to be placed in the near future if no improvement.

## 2019-02-14 ENCOUNTER — Encounter: Payer: Self-pay | Admitting: Adult Health

## 2019-02-14 ENCOUNTER — Non-Acute Institutional Stay: Payer: Medicare Other | Admitting: Adult Health

## 2019-02-14 DIAGNOSIS — M519 Unspecified thoracic, thoracolumbar and lumbosacral intervertebral disc disorder: Secondary | ICD-10-CM | POA: Diagnosis not present

## 2019-02-14 DIAGNOSIS — R531 Weakness: Secondary | ICD-10-CM

## 2019-02-14 DIAGNOSIS — R682 Dry mouth, unspecified: Secondary | ICD-10-CM | POA: Diagnosis not present

## 2019-02-14 DIAGNOSIS — K117 Disturbances of salivary secretion: Secondary | ICD-10-CM

## 2019-02-14 DIAGNOSIS — E860 Dehydration: Secondary | ICD-10-CM

## 2019-02-14 DIAGNOSIS — R410 Disorientation, unspecified: Secondary | ICD-10-CM

## 2019-02-14 MED ORDER — TRAMADOL HCL 50 MG PO TABS: 25.0000 mg | ORAL_TABLET | Freq: Four times a day (QID) | ORAL | 0 refills | Status: DC | PRN

## 2019-02-14 NOTE — Progress Notes (Signed)
Location:  Occupational psychologist of Service:  ALF (13) Provider:   Cindi Carbon, ANP [place] [facility] [phone]   Gayland Curry, DO  Patient Care Team: Gayland Curry, DO as PCP - General (Geriatric Medicine)  Extended Emergency Contact Information Primary Emergency Contact: Rudean Hitt Address: [place], [place] [address] [name] of [place] Phone: [phone] Mobile Phone: [phone] Relation: Daughter Secondary Emergency Contact: Simms,Carol Address: [address]          [place], [place] [address] [place] Phone: [phone] Work Phone: [phone] Relation: Other  Code Status:  DNR Goals of care: Advanced Directive information Advanced Directives 05/12/2018  Does Patient Have a Medical Advance Directive? Yes  Type of Paramedic of [place];Living will  Does patient want to make changes to medical advance directive? No - Patient declined  Copy of [place] in Chart? Yes     Chief Complaint  Patient presents with   Acute Visit    f/u delirium, poor intake    HPI:  Pt is a 85 y.o. male seen today for an acute visit for f/u regarding dementia and poor intake. He was treated last week for a UTI 75,000 colonies of E Coli with Cipro and later omnicef. During this period he developed delirium with combative behavior, confusion, decreased intake, and weakness. Despite treatment he did not recover. He has been voicing thoughts of death and wanting to die. He has refused additional treatment. His family has requested comfort care only. He was started on seroquel. He intermittently takes it and it has helped with delusions and paranoia. He has not needed IM haldol or ativan in the past 3 days. He is still taking in very little but is consuming boost. He has chronic back pain due to lumbar disc disease and scoliosis. He reports mild pain  and uses tylenol. He is in bed all the time and now and needs assistance with all ADLs.    Past Medical History:  Diagnosis Date   Acute kidney injury ([facility]) 03/06/14   Anemia, unspecified 03/06/14   Arthritis    BPH (benign prostatic hyperplasia)    Cerebral atrophy ([facility]) 03/06/14   Cerebrovascular disease 03/06/14   Degenerative disc disease, lumbar 03/06/14   Diverticula, bladder acquired    Edema 03/06/14   GERD (gastroesophageal reflux disease)    H/O: GI bleed 1986   Hiatal hernia    History of fall 03/06/14   Hypercalcemia 03/06/14   Hyperlipidemia    Hypertension    Impotence    LFT elevation 03/06/14   Lumbago 03/06/14   Mallory-Weiss tear 1986   Osteoporosis    Rhabdomyolysis 03/06/14   Scoliosis of cervical spine 03/06/14   Scoliosis of lumbar spine 03/06/14   Shingles    Spermatocele    bilateral, left greater than right   Troponin level elevated 03/06/14   Weak 03/06/14   Past Surgical History:  Procedure Laterality Date   BACK SURGERY  10/2009   ESI for surgery for lumbar spinal stenosis   clubbed toe repair  2004   COLONOSCOPY  08/15/2011   INGUINAL HERNIA REPAIR Right 2006   REPLACEMENT TOTAL KNEE BILATERAL  2007, 2008   TONSILLECTOMY AND ADENOIDECTOMY  1939   X-STOP IMPLANTATION      Allergies  Allergen Reactions   Amrix [Cyclobenzaprine]    Shellfish Allergy  Outpatient Encounter Medications as of 02/14/2019  Medication Sig   haloperidol lactate (HALDOL) 5 MG/ML injection 1.25 mg every 4 (four) hours as needed.   traMADol (ULTRAM) 50 MG tablet Take 0.5 tablets (25 mg total) by mouth every 6 (six) hours as needed.   [DISCONTINUED] traMADol (ULTRAM) 50 MG tablet Take 25 mg by mouth every 6 (six) hours as needed.   acetaminophen (TYLENOL) 325 MG tablet Take 650 mg by mouth every 4 (four) hours as needed.   acetaminophen (TYLENOL) 325 MG tablet Take 650 mg by mouth daily.   amLODipine (NORVASC) 5 MG tablet Take 0.5  tablets by mouth daily.    famotidine (PEPCID) 20 MG tablet Take 20 mg by mouth daily as needed for heartburn or indigestion.   LORazepam (ATIVAN) 0.5 MG tablet Take 1 tablet (0.5 mg total) by mouth every 4 (four) hours as needed for anxiety.   QUEtiapine (SEROQUEL) 25 MG tablet Take 25 mg by mouth 2 (two) times daily.    vitamin B-12 (CYANOCOBALAMIN) 1000 MCG tablet Take 1,000 mcg by mouth daily.   No facility-administered encounter medications on file as of 02/14/2019.     Review of Systems  Unable to perform ROS: Acuity of condition    Immunization History  Administered Date(s) Administered   Td 09/02/2007   Tdap 11/11/2017   Pertinent  Health Maintenance Due  Topic Date Due   PNA vac Low Risk Adult (1 of 2 - PCV13) 04/01/1992   DEXA SCAN  12/30/2014   INFLUENZA VACCINE  Discontinued   Fall Risk  05/12/2018 12/29/2017 11/11/2017  Falls in the past year? No No No   Functional Status Survey:    Vitals:   02/14/19 1006  BP: 121/76  Pulse: 63  Resp: 16  Temp: 98.1 F (36.7 C)  SpO2: 96%   There is no height or weight on file to calculate BMI. Physical Exam Constitutional:      General: He is not in acute distress.    Appearance: He is not diaphoretic.  HENT:     Head: Normocephalic and atraumatic.     Mouth/Throat:     Mouth: Mucous membranes are dry.  Neck:     Thyroid: No thyromegaly.     Vascular: No JVD.     Trachea: No tracheal deviation.  Cardiovascular:     Rate and Rhythm: Normal rate. Rhythm irregular.     Heart sounds: No murmur.  Pulmonary:     Effort: Pulmonary effort is normal. No respiratory distress.     Breath sounds: Normal breath sounds. No wheezing.  Abdominal:     General: Bowel sounds are normal. There is no distension.     Palpations: Abdomen is soft.     Tenderness: There is no abdominal tenderness.  Musculoskeletal:     Right lower leg: No edema.     Left lower leg: No edema.     Comments: kyphosis  Lymphadenopathy:      Cervical: No cervical adenopathy.  Skin:    General: Skin is warm and dry.  Neurological:     Mental Status: He is alert.     Comments: Oriented to self and place.   Psychiatric:     Comments: With drawn, guarded.      Labs reviewed: Recent Labs    05/13/18 0100 02/08/19  NA 141 133*  K 4.4 3.7  BUN 24* 24*  CREATININE 1.6* 1.8*   Recent Labs    05/13/18 0100 02/08/19  AST 18 32  ALT 15 26  ALKPHOS 79 81   Recent Labs    05/13/18 0100 02/08/19  WBC 6.3 7.7  NEUTROABS  --  4  HGB 12.3* 12.4*  HCT 37* 36*  PLT 244 369   Lab Results  Component Value Date   TSH 5.56 02/08/2019   No results found for: HGBA1C No results found for: CHOL, HDL, LDLCALC, LDLDIRECT, TRIG, CHOLHDL  Significant Diagnostic Results in last 30 days:  No results found.  Assessment/Plan 1. Acute delirium Unclear etiology. Treated for a UTI with no improvement Improved with seroquel 25 mg bid Continue Haldol 1.25 mg q 4 hrs prn Hospice referral due to decreased intake, delirium superimposed on underlying dementia, dehydration, with goals of care comfort based.  2. Lumbar disc disease Continue tylenol Add Ultram 25 mg q 6 hrs prn Dulcolax PR qd prn constipatio  3. Generalized weakness Progressive weakness generalized due to acute illness. Hospital bed in place. Family would like him to stay in Assisted living and they will provide homecare.   4. Xerostomia Biotene spray 2 sprays QID prn  5. Dehydration Clinically dry on exam. Goals of care are comfort based with no hospitalizations or IVs.     Family/ staff Communication: left message with Ms. Simms. Spoke with his daughter, Ms. Ferguson.  Labs/tests ordered:  NA

## 2019-02-16 DIAGNOSIS — M810 Age-related osteoporosis without current pathological fracture: Secondary | ICD-10-CM | POA: Diagnosis not present

## 2019-02-16 DIAGNOSIS — Z741 Need for assistance with personal care: Secondary | ICD-10-CM | POA: Diagnosis not present

## 2019-02-16 DIAGNOSIS — M1990 Unspecified osteoarthritis, unspecified site: Secondary | ICD-10-CM | POA: Diagnosis not present

## 2019-02-16 DIAGNOSIS — M419 Scoliosis, unspecified: Secondary | ICD-10-CM | POA: Diagnosis not present

## 2019-02-16 DIAGNOSIS — K219 Gastro-esophageal reflux disease without esophagitis: Secondary | ICD-10-CM | POA: Diagnosis not present

## 2019-02-16 DIAGNOSIS — N400 Enlarged prostate without lower urinary tract symptoms: Secondary | ICD-10-CM | POA: Diagnosis not present

## 2019-02-16 DIAGNOSIS — R627 Adult failure to thrive: Secondary | ICD-10-CM | POA: Diagnosis not present

## 2019-02-16 DIAGNOSIS — I10 Essential (primary) hypertension: Secondary | ICD-10-CM | POA: Diagnosis not present

## 2019-02-16 DIAGNOSIS — F0280 Dementia in other diseases classified elsewhere without behavioral disturbance: Secondary | ICD-10-CM | POA: Diagnosis not present

## 2019-02-16 DIAGNOSIS — N390 Urinary tract infection, site not specified: Secondary | ICD-10-CM | POA: Diagnosis not present

## 2019-02-16 DIAGNOSIS — Z6821 Body mass index (BMI) 21.0-21.9, adult: Secondary | ICD-10-CM | POA: Diagnosis not present

## 2019-02-16 DIAGNOSIS — E785 Hyperlipidemia, unspecified: Secondary | ICD-10-CM | POA: Diagnosis not present

## 2019-02-16 DIAGNOSIS — G309 Alzheimer's disease, unspecified: Secondary | ICD-10-CM | POA: Diagnosis not present

## 2019-02-28 DIAGNOSIS — R627 Adult failure to thrive: Secondary | ICD-10-CM | POA: Diagnosis not present

## 2019-02-28 DIAGNOSIS — E785 Hyperlipidemia, unspecified: Secondary | ICD-10-CM | POA: Diagnosis not present

## 2019-02-28 DIAGNOSIS — F0280 Dementia in other diseases classified elsewhere without behavioral disturbance: Secondary | ICD-10-CM | POA: Diagnosis not present

## 2019-02-28 DIAGNOSIS — I10 Essential (primary) hypertension: Secondary | ICD-10-CM | POA: Diagnosis not present

## 2019-02-28 DIAGNOSIS — G309 Alzheimer's disease, unspecified: Secondary | ICD-10-CM | POA: Diagnosis not present

## 2019-02-28 DIAGNOSIS — N390 Urinary tract infection, site not specified: Secondary | ICD-10-CM | POA: Diagnosis not present

## 2019-03-16 ENCOUNTER — Other Ambulatory Visit: Payer: Self-pay

## 2019-03-16 ENCOUNTER — Non-Acute Institutional Stay: Payer: Medicare Other | Admitting: Internal Medicine

## 2019-03-16 ENCOUNTER — Encounter: Payer: Self-pay | Admitting: Internal Medicine

## 2019-03-16 VITALS — BP 130/70 | HR 63 | Temp 97.9°F | Ht 67.0 in | Wt 150.0 lb

## 2019-03-16 DIAGNOSIS — N183 Chronic kidney disease, stage 3 unspecified: Secondary | ICD-10-CM

## 2019-03-16 DIAGNOSIS — M4125 Other idiopathic scoliosis, thoracolumbar region: Secondary | ICD-10-CM | POA: Diagnosis not present

## 2019-03-16 DIAGNOSIS — Z7189 Other specified counseling: Secondary | ICD-10-CM | POA: Diagnosis not present

## 2019-03-16 DIAGNOSIS — I872 Venous insufficiency (chronic) (peripheral): Secondary | ICD-10-CM | POA: Diagnosis not present

## 2019-03-16 DIAGNOSIS — R531 Weakness: Secondary | ICD-10-CM

## 2019-03-16 DIAGNOSIS — M519 Unspecified thoracic, thoracolumbar and lumbosacral intervertebral disc disorder: Secondary | ICD-10-CM

## 2019-03-16 DIAGNOSIS — R410 Disorientation, unspecified: Secondary | ICD-10-CM
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
## 2019-03-19 DIAGNOSIS — K219 Gastro-esophageal reflux disease without esophagitis: Secondary | ICD-10-CM | POA: Diagnosis not present

## 2019-03-19 DIAGNOSIS — M1990 Unspecified osteoarthritis, unspecified site: Secondary | ICD-10-CM | POA: Diagnosis not present

## 2019-03-19 DIAGNOSIS — F0280 Dementia in other diseases classified elsewhere without behavioral disturbance: Secondary | ICD-10-CM | POA: Diagnosis not present

## 2019-03-19 DIAGNOSIS — G309 Alzheimer's disease, unspecified: Secondary | ICD-10-CM | POA: Diagnosis not present

## 2019-03-19 DIAGNOSIS — Z6821 Body mass index (BMI) 21.0-21.9, adult: Secondary | ICD-10-CM | POA: Diagnosis not present

## 2019-03-19 DIAGNOSIS — M419 Scoliosis, unspecified: Secondary | ICD-10-CM | POA: Diagnosis not present

## 2019-03-19 DIAGNOSIS — R627 Adult failure to thrive: Secondary | ICD-10-CM | POA: Diagnosis not present

## 2019-03-19 DIAGNOSIS — E785 Hyperlipidemia, unspecified: Secondary | ICD-10-CM | POA: Diagnosis not present

## 2019-03-19 DIAGNOSIS — N390 Urinary tract infection, site not specified: Secondary | ICD-10-CM | POA: Diagnosis not present

## 2019-03-19 DIAGNOSIS — Z741 Need for assistance with personal care: Secondary | ICD-10-CM | POA: Diagnosis not present

## 2019-03-19 DIAGNOSIS — N400 Enlarged prostate without lower urinary tract symptoms: Secondary | ICD-10-CM | POA: Diagnosis not present

## 2019-03-19 DIAGNOSIS — I10 Essential (primary) hypertension: Secondary | ICD-10-CM | POA: Diagnosis not present

## 2019-03-19 DIAGNOSIS — M810 Age-related osteoporosis without current pathological fracture: Secondary | ICD-10-CM | POA: Diagnosis not present

## 2019-03-26 DIAGNOSIS — F0280 Dementia in other diseases classified elsewhere without behavioral disturbance: Secondary | ICD-10-CM | POA: Diagnosis not present

## 2019-03-26 DIAGNOSIS — R627 Adult failure to thrive: Secondary | ICD-10-CM | POA: Diagnosis not present

## 2019-03-26 DIAGNOSIS — N390 Urinary tract infection, site not specified: Secondary | ICD-10-CM | POA: Diagnosis not present

## 2019-03-26 DIAGNOSIS — E785 Hyperlipidemia, unspecified: Secondary | ICD-10-CM | POA: Diagnosis not present

## 2019-03-26 DIAGNOSIS — I10 Essential (primary) hypertension: Secondary | ICD-10-CM | POA: Diagnosis not present

## 2019-03-26 DIAGNOSIS — G309 Alzheimer's disease, unspecified: Secondary | ICD-10-CM | POA: Diagnosis not present

## 2019-03-30 DIAGNOSIS — Z20828 Contact with and (suspected) exposure to other viral communicable diseases: Secondary | ICD-10-CM | POA: Diagnosis not present

## 2019-04-14 DIAGNOSIS — R627 Adult failure to thrive: Secondary | ICD-10-CM | POA: Diagnosis not present

## 2019-04-14 DIAGNOSIS — F0280 Dementia in other diseases classified elsewhere without behavioral disturbance: Secondary | ICD-10-CM | POA: Diagnosis not present

## 2019-04-14 DIAGNOSIS — E785 Hyperlipidemia, unspecified: Secondary | ICD-10-CM | POA: Diagnosis not present

## 2019-04-14 DIAGNOSIS — G309 Alzheimer's disease, unspecified: Secondary | ICD-10-CM | POA: Diagnosis not present

## 2019-04-14 DIAGNOSIS — N390 Urinary tract infection, site not specified: Secondary | ICD-10-CM | POA: Diagnosis not present

## 2019-04-14 DIAGNOSIS — I10 Essential (primary) hypertension: Secondary | ICD-10-CM | POA: Diagnosis not present

## 2019-05-18 DIAGNOSIS — Z9189 Other specified personal risk factors, not elsewhere classified: Secondary | ICD-10-CM | POA: Diagnosis not present

## 2019-05-18 LAB — NOVEL CORONAVIRUS, NAA: SARS-CoV-2, NAA: NOT DETECTED

## 2019-05-26 DIAGNOSIS — Z20828 Contact with and (suspected) exposure to other viral communicable diseases: Secondary | ICD-10-CM | POA: Diagnosis not present

## 2019-05-27 ENCOUNTER — Other Ambulatory Visit: Payer: Self-pay | Admitting: *Deleted

## 2019-05-28 LAB — NOVEL CORONAVIRUS, NAA: SARS-CoV-2, NAA: NOT DETECTED

## 2019-06-01 DIAGNOSIS — Z9189 Other specified personal risk factors, not elsewhere classified: Secondary | ICD-10-CM | POA: Diagnosis not present

## 2019-06-07 DIAGNOSIS — Z9189 Other specified personal risk factors, not elsewhere classified: Secondary | ICD-10-CM | POA: Diagnosis not present

## 2019-06-13 ENCOUNTER — Non-Acute Institutional Stay: Payer: Medicare Other | Admitting: Adult Health

## 2019-06-13 ENCOUNTER — Encounter: Payer: Self-pay | Admitting: Adult Health

## 2019-06-13 DIAGNOSIS — Z0000 Encounter for general adult medical examination without abnormal findings: Secondary | ICD-10-CM | POA: Diagnosis not present

## 2019-06-13 NOTE — Progress Notes (Signed)
Subjective:   [name] is a 85 y.o. male residing in assisted living at [facility], who presents for Medicare Annual/Subsequent preventive examination.  Review of Systems:  Cardiac Risk Factors include: advanced age (>71men, >93 women);male gender;sedentary lifestyle     Objective:    Vitals: Wt 152 lb (68.9 kg)   BMI 23.81 kg/m   Body mass index is 23.81 kg/m.  Advanced Directives 06/13/2019 06/13/2019 05/12/2018 12/29/2017 11/11/2017 02/27/2014  Does Patient Have a Medical Advance Directive? No;Yes Yes Yes Yes Yes Patient has advance directive, copy not in chart  Type of Advance Directive [place];Out of facility DNR (pink MOST or yellow form);Living will [place];Living will;Out of facility DNR (pink MOST or yellow form) [place];Living will [place];Living will [place];Living will [place];Living will  Does patient want to make changes to medical advance directive? - No - Patient declined No - Patient declined No - Patient declined No - Patient declined No change requested  Copy of [place] in Chart? Yes - validated most recent copy scanned in chart (See row information) Yes - validated most recent copy scanned in chart (See row information) Yes Yes Yes -  Would patient like information on creating a medical advance directive? No - Patient declined - - - - -  Pre-existing out of facility DNR order (yellow form or pink MOST form) Pink MOST form placed in chart (order not valid for inpatient use);Yellow form placed in chart (order not valid for inpatient use) - - - - -    Tobacco Social History   Tobacco Use  Smoking Status Former Smoker  . Types: Cigarettes  Smokeless Tobacco Never Used  Tobacco Comment   quit in 1960s     Counseling given: Not Answered Comment: quit in 1960s   Clinical Intake:  Pre-visit preparation completed: No   Pain : No/denies pain     BMI - recorded: 25.9 Nutritional Status: BMI of 19-24  Normal Nutritional Risks: None Diabetes: No  How often do you need to have someone help you when you read instructions, pamphlets, or other written materials from your doctor or pharmacy?: 2 - Rarely What is the last grade level you completed in school?: graduate school  Interpreter Needed?: No  Information entered by :: Royal Hawthorn NP  Past Medical History:  Diagnosis Date  . Acute kidney injury ([place]) 03/06/14  . Anemia, unspecified 03/06/14  . Arthritis   . BPH (benign prostatic hyperplasia)   . Cerebral atrophy ([place]) 03/06/14  . Cerebrovascular disease 03/06/14  . Degenerative disc disease, lumbar 03/06/14  . Diverticula, bladder acquired   . Edema 03/06/14  . GERD (gastroesophageal reflux disease)   . H/O: GI bleed 1986  . Hiatal hernia   . History of fall 03/06/14  . Hypercalcemia 03/06/14  . Hyperlipidemia   . Hypertension   . Impotence   . LFT elevation 03/06/14  . Lumbago 03/06/14  . Mallory-Weiss tear 1986  . Osteoporosis   . Rhabdomyolysis 03/06/14  . Scoliosis of cervical spine 03/06/14  . Scoliosis of lumbar spine 03/06/14  . Shingles   . Spermatocele    bilateral, left greater than right  . Troponin level elevated 03/06/14  . Weak 03/06/14   Past Surgical History:  Procedure Laterality Date  . BACK SURGERY  10/2009   ESI for surgery for lumbar spinal stenosis  . clubbed toe repair  2004  . COLONOSCOPY  08/15/2011  . INGUINAL HERNIA REPAIR Right 2006  . REPLACEMENT TOTAL KNEE BILATERAL  2007, 2008  . TONSILLECTOMY AND ADENOIDECTOMY  1939  . X-STOP IMPLANTATION     Family History  Problem Relation Age of Onset  . Alcoholism Father   . Liver disease Father   . Colon cancer Neg Hx    Social History   Socioeconomic History  . Marital status: Divorced    Spouse name: Not on file  . Number of children: 2  . Years of education: Not on file  . Highest education level: Not  on file  Occupational History  . Occupation: retired  Scientific laboratory technician  . Financial resource strain: Not hard at all  . Food insecurity    Worry: Never true    Inability: Never true  . Transportation needs    Medical: No    Non-medical: No  Tobacco Use  . Smoking status: Former Smoker    Types: Cigarettes  . Smokeless tobacco: Never Used  . Tobacco comment: quit in 1960s  Substance and Sexual Activity  . Alcohol use: Yes    Alcohol/week: 14.0 standard drinks    Types: 14 Standard drinks or equivalent per week    Comment: couple of drinks of scotch every other day  . Drug use: No  . Sexual activity: Not on file  Lifestyle  . Physical activity    Days per week: 7 days    Minutes per session: 30 min  . Stress: Not at all  Relationships  . Social connections    Talks on phone: More than three times a week    Gets together: More than three times a week    Attends religious service: Never    Active member of club or organization: No    Attends meetings of clubs or organizations: Never    Relationship status: Widowed  Other Topics Concern  . Not on file  Social History Narrative   Tobacco use, amount per day now:  NONE   Past tobacco use, amount per day: 1 PACK PER DAY   How many years did you use tobacco: QUIT AGE 17/ 17 YEARS   Alcohol use (drinks per week): 3OZ DAILY/ 2 4OZ GLASSES OF WINE   Diet: REGULAR   Do you drink/eat things with caffeine:   Marital status: DIVORCED              What year were you married?   Do you live in a house, apartment, assisted living, condo, trailer, etc.? [place]    Is it one or more stories?  ONE STORY   How many persons live in your home? 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  WALK                                Type and how often?   Do you have a living will? YES   Do you have a DNR form?     NO                               If not, do you want to discuss one?   Do you have signed POA/HPOA forms?   YES  If so, please bring to you appointment    Outpatient Encounter Medications as of 06/13/2019  Medication Sig  . acetaminophen (TYLENOL) 325 MG tablet Take 650 mg by mouth every 4 (four) hours as needed.  Marland Kitchen acetaminophen (TYLENOL) 325 MG tablet Take 650 mg by mouth daily.  Marland Kitchen amLODipine (NORVASC) 2.5 MG tablet Take 1 tablet by mouth daily.  Marland Kitchen antiseptic oral rinse (BIOTENE) LIQD 15 mLs by Mouth Rinse route 4 (four) times daily as needed for dry mouth.  . bisacodyl (DULCOLAX) 10 MG suppository Place 10 mg rectally as needed for moderate constipation.  . famotidine (PEPCID) 20 MG tablet Take 20 mg by mouth daily as needed for heartburn or indigestion.  . haloperidol lactate (HALDOL) 5 MG/ML injection 1.25 mg every 4 (four) hours as needed.  Marland Kitchen LORazepam (ATIVAN) 0.5 MG tablet Take 1 tablet (0.5 mg total) by mouth every 4 (four) hours as needed for anxiety.  . ondansetron (ZOFRAN) 4 MG tablet Take 4 mg by mouth every 6 (six) hours as needed for nausea or vomiting.  Marland Kitchen QUEtiapine (SEROQUEL) 25 MG tablet Take 25 mg by mouth 2 (two) times daily.   . traMADol (ULTRAM) 50 MG tablet Take 0.5 tablets (25 mg total) by mouth every 6 (six) hours as needed.  . triamcinolone cream (KENALOG) 0.1 % Apply 1 application topically as needed.  . vitamin B-12 (CYANOCOBALAMIN) 1000 MCG tablet Take 1,000 mcg by mouth daily.   No facility-administered encounter medications on file as of 06/13/2019.     Activities of Daily Living In your present state of health, do you have any difficulty performing the following activities: 06/13/2019  Hearing? Y  Vision? Y  Difficulty concentrating or making decisions? Y  Walking or climbing stairs? Y  Dressing or bathing? Y  Doing errands, shopping? Y  Preparing Food and eating ? Y  Using the Toilet? N  In the past six months, have you accidently leaked  urine? Y  Do you have problems with loss of bowel control? N  Managing your Medications? Y  Managing your Finances? Y  Housekeeping or managing your Housekeeping? Y  Some recent data might be hidden    Patient Care Team: Gayland Curry, DO as PCP - General (Geriatric Medicine)   Assessment:   This is a routine wellness examination for [facility].  Exercise Activities and Dietary recommendations Current Exercise Habits: The patient does not participate in regular exercise at present, Exercise limited by: orthopedic condition(s)  Goals    . DIET - EAT MORE FRUITS AND VEGETABLES       Fall Risk Fall Risk  06/13/2019 03/16/2019 05/12/2018 12/29/2017 11/11/2017  Falls in the past year? 0 0 No No No  Number falls in past yr: 0 0 - - -  Injury with Fall? 0 0 - - -  Risk for fall due to : Impaired balance/gait - - - -  Follow up Falls evaluation completed - - - -   Is the patient's home free of loose throw rugs in walkways, pet beds, electrical cords, etc?   yes      Grab bars in the bathroom? yes      Handrails on the stairs?   yes      Adequate lighting?   yes  Timed Get Up and Go Performed:  6 seconds  Depression Screen PHQ 2/9 Scores 06/13/2019 03/16/2019 05/12/2018 12/29/2017  PHQ - 2 Score 0 0 0 0    Cognitive Function MMSE - Mini Mental State Exam  06/13/2019 08/20/2017  Orientation to time 4 4  Orientation to Place 4 5  Registration 3 3  Attention/ Calculation 3 5  Recall 3 1  Language- name 2 objects 2 2  Language- repeat 1 1  Language- follow 3 step command 3 3  Language- read & follow direction 1 1  Write a sentence 1 1  Copy design 1 1  Total score 26 27        Immunization History  Administered Date(s) Administered  . Td 09/02/2007  . Tdap 11/11/2017    Qualifies for Shingles Vaccine? declined  Screening Tests Health Maintenance  Topic Date Due  . DEXA SCAN  12/30/2014  . PNA vac Low Risk Adult (1 of 2 - PCV13) 06/12/2020 (Originally 04/01/1992)  .  TETANUS/TDAP  11/12/2027  . INFLUENZA VACCINE  Discontinued   Cancer Screenings: Lung: Low Dose CT Chest recommended if Age 43-80 years, 30 pack-year currently smoking OR have quit w/in 15years. Patient does not qualify. Colorectal: aged out   Additional Screenings: not indicated  Hepatitis C Screening:      Plan:     I have personally reviewed and noted the following in the patient's chart:   . Medical and social history . Use of alcohol, tobacco or illicit drugs  . Current medications and supplements . Functional ability and status . Nutritional status . Physical activity . Advanced directives . List of other physicians . Hospitalizations, surgeries, and ER visits in previous 12 months . Vitals . Screenings to include cognitive, depression, and falls . Referrals and appointments  In addition, I have reviewed and discussed with patient certain preventive protocols, quality metrics, and best practice recommendations. A written personalized care plan for preventive services as well as general preventive health recommendations were provided to patient.     Royal Hawthorn, NP  06/13/2019

## 2019-06-13 NOTE — Patient Instructions (Signed)
[name] , Thank you for taking time to come for your Medicare Wellness Visit. I appreciate your ongoing commitment to your health goals. Please review the following plan we discussed and let me know if I can assist you in the future.   Screening recommendations/referrals: Colonoscopy aged out    Recommended yearly ophthalmology/optometry visit for glaucoma screening and checkup Recommended yearly dental visit for hygiene and checkup  Vaccinations: Influenza vaccine due Pneumococcal vaccine refused Tdap vaccine up to date Shingles vaccine refused    Advanced directives: reviewed  Conditions/risks identified: cardiac risk  Next appointment: 1 year  Preventive Care 27 Years and Older, Male Preventive care refers to lifestyle choices and visits with your health care provider that can promote health and wellness. What does preventive care include?  A yearly physical exam. This is also called an annual well check.  Dental exams once or twice a year.  Routine eye exams. Ask your health care provider how often you should have your eyes checked.  Personal lifestyle choices, including:  Daily care of your teeth and gums.  Regular physical activity.  Eating a healthy diet.  Avoiding tobacco and drug use.  Limiting alcohol use.  Practicing safe sex.  Taking low doses of aspirin every day.  Taking vitamin and mineral supplements as recommended by your health care provider. What happens during an annual well check? The services and screenings done by your health care provider during your annual well check will depend on your age, overall health, lifestyle risk factors, and family history of disease. Counseling  Your health care provider may ask you questions about your:  Alcohol use.  Tobacco use.  Drug use.  Emotional well-being.  Home and relationship well-being.  Sexual activity.  Eating habits.  History of falls.  Memory and ability to understand (cognition).   Work and work Statistician. Screening  You may have the following tests or measurements:  Height, weight, and BMI.  Blood pressure.  Lipid and cholesterol levels. These may be checked every 5 years, or more frequently if you are over 62 years old.  Skin check.  Lung cancer screening. You may have this screening every year starting at age 36 if you have a 30-pack-year history of smoking and currently smoke or have quit within the past 15 years.  Fecal occult blood test (FOBT) of the stool. You may have this test every year starting at age 64.  Flexible sigmoidoscopy or colonoscopy. You may have a sigmoidoscopy every 5 years or a colonoscopy every 10 years starting at age 64.  Prostate cancer screening. Recommendations will vary depending on your family history and other risks.  Hepatitis C blood test.  Hepatitis B blood test.  Sexually transmitted disease (STD) testing.  Diabetes screening. This is done by checking your blood sugar (glucose) after you have not eaten for a while (fasting). You may have this done every 1-3 years.  Abdominal aortic aneurysm (AAA) screening. You may need this if you are a current or former smoker.  Osteoporosis. You may be screened starting at age 79 if you are at high risk. Talk with your health care provider about your test results, treatment options, and if necessary, the need for more tests. Vaccines  Your health care provider may recommend certain vaccines, such as:  Influenza vaccine. This is recommended every year.  Tetanus, diphtheria, and acellular pertussis (Tdap, Td) vaccine. You may need a Td booster every 10 years.  Zoster vaccine. You may need this after age 59.  Pneumococcal 13-valent conjugate (PCV13) vaccine. One dose is recommended after age 33.  Pneumococcal polysaccharide (PPSV23) vaccine. One dose is recommended after age 51. Talk to your health care provider about which screenings and vaccines you need and how often you  need them. This information is not intended to replace advice given to you by your health care provider. Make sure you discuss any questions you have with your health care provider. Document Released: 08/31/2015 Document Revised: 04/23/2016 Document Reviewed: 06/05/2015 Elsevier Interactive Patient Education  2017 [place] Prevention in the Home Falls can cause injuries. They can happen to people of all ages. There are many things you can do to make your home safe and to help prevent falls. What can I do on the outside of my home?  Regularly fix the edges of walkways and driveways and fix any cracks.  Remove anything that might make you trip as you walk through a door, such as a raised step or threshold.  Trim any bushes or trees on the path to your home.  Use bright outdoor lighting.  Clear any walking paths of anything that might make someone trip, such as rocks or tools.  Regularly check to see if handrails are loose or broken. Make sure that both sides of any steps have handrails.  Any raised decks and porches should have guardrails on the edges.  Have any leaves, snow, or ice cleared regularly.  Use sand or salt on walking paths during winter.  Clean up any spills in your garage right away. This includes oil or grease spills. What can I do in the bathroom?  Use night lights.  Install grab bars by the toilet and in the tub and shower. Do not use towel bars as grab bars.  Use non-skid mats or decals in the tub or shower.  If you need to sit down in the shower, use a plastic, non-slip stool.  Keep the floor dry. Clean up any water that spills on the floor as soon as it happens.  Remove soap buildup in the tub or shower regularly.  Attach bath mats securely with double-sided non-slip rug tape.  Do not have throw rugs and other things on the floor that can make you trip. What can I do in the bedroom?  Use night lights.  Make sure that you have a light by  your bed that is easy to reach.  Do not use any sheets or blankets that are too big for your bed. They should not hang down onto the floor.  Have a firm chair that has side arms. You can use this for support while you get dressed.  Do not have throw rugs and other things on the floor that can make you trip. What can I do in the kitchen?  Clean up any spills right away.  Avoid walking on wet floors.  Keep items that you use a lot in easy-to-reach places.  If you need to reach something above you, use a strong step stool that has a grab bar.  Keep electrical cords out of the way.  Do not use floor polish or wax that makes floors slippery. If you must use wax, use non-skid floor wax.  Do not have throw rugs and other things on the floor that can make you trip. What can I do with my stairs?  Do not leave any items on the stairs.  Make sure that there are handrails on both sides of the stairs and use them.  Fix handrails that are broken or loose. Make sure that handrails are as long as the stairways.  Check any carpeting to make sure that it is firmly attached to the stairs. Fix any carpet that is loose or worn.  Avoid having throw rugs at the top or bottom of the stairs. If you do have throw rugs, attach them to the floor with carpet tape.  Make sure that you have a light switch at the top of the stairs and the bottom of the stairs. If you do not have them, ask someone to add them for you. What else can I do to help prevent falls?  Wear shoes that:  Do not have high heels.  Have rubber bottoms.  Are comfortable and fit you well.  Are closed at the toe. Do not wear sandals.  If you use a stepladder:  Make sure that it is fully opened. Do not climb a closed stepladder.  Make sure that both sides of the stepladder are locked into place.  Ask someone to hold it for you, if possible.  Clearly mark and make sure that you can see:  Any grab bars or handrails.  First and  last steps.  Where the edge of each step is.  Use tools that help you move around (mobility aids) if they are needed. These include:  Canes.  Walkers.  Scooters.  Crutches.  Turn on the lights when you go into a dark area. Replace any light bulbs as soon as they burn out.  Set up your furniture so you have a clear path. Avoid moving your furniture around.  If any of your floors are uneven, fix them.  If there are any pets around you, be aware of where they are.  Review your medicines with your doctor. Some medicines can make you feel dizzy. This can increase your chance of falling. Ask your doctor what other things that you can do to help prevent falls. This information is not intended to replace advice given to you by your health care provider. Make sure you discuss any questions you have with your health care provider. Document Released: 05/31/2009 Document Revised: 01/10/2016 Document Reviewed: 09/08/2014 Elsevier Interactive Patient Education  2017 [employer].

## 2019-06-14 DIAGNOSIS — Z9189 Other specified personal risk factors, not elsewhere classified: Secondary | ICD-10-CM | POA: Diagnosis not present

## 2019-06-15 ENCOUNTER — Other Ambulatory Visit: Payer: Self-pay

## 2019-06-15 ENCOUNTER — Encounter: Payer: Self-pay | Admitting: Internal Medicine

## 2019-06-15 ENCOUNTER — Non-Acute Institutional Stay: Payer: Medicare Other | Admitting: Internal Medicine

## 2019-06-15 VITALS — BP 144/82 | HR 61 | Temp 97.6°F | Ht 67.0 in | Wt 154.8 lb

## 2019-06-15 DIAGNOSIS — R1012 Left upper quadrant pain: Secondary | ICD-10-CM

## 2019-06-15 DIAGNOSIS — R531 Weakness: Secondary | ICD-10-CM | POA: Diagnosis not present

## 2019-06-15 DIAGNOSIS — M519 Unspecified thoracic, thoracolumbar and lumbosacral intervertebral disc disorder: Secondary | ICD-10-CM | POA: Diagnosis not present

## 2019-06-15 DIAGNOSIS — E440 Moderate protein-calorie malnutrition: Secondary | ICD-10-CM

## 2019-06-15 DIAGNOSIS — R410 Disorientation, unspecified: Secondary | ICD-10-CM

## 2019-06-15 DIAGNOSIS — K439 Ventral hernia without obstruction or gangrene: Secondary | ICD-10-CM | POA: Diagnosis not present

## 2019-06-15 DIAGNOSIS — M4125 Other idiopathic scoliosis, thoracolumbar region: Secondary | ICD-10-CM

## 2019-06-15 NOTE — Progress Notes (Signed)
Location:  [facility] of Service:  Clinic (12)  Provider: Shamari Lofquist L. Mariea Clonts, D.O., C.M.D.  Code Status: DNR Goals of Care:  Advanced Directives 06/13/2019  Does Patient Have a Medical Advance Directive? No;Yes  Type of Paramedic of [place];Out of facility DNR (pink MOST or yellow form);Living will  Does patient want to make changes to medical advance directive? -  Copy of [place] in Chart? Yes - validated most recent copy scanned in chart (See row information)  Would patient like information on creating a medical advance directive? No - Patient declined  Pre-existing out of facility DNR order (yellow form or pink MOST form) Pink MOST form placed in chart (order not valid for inpatient use);Yellow form placed in chart (order not valid for inpatient use)     Chief Complaint  Patient presents with  . Medical Management of Chronic Issues    4 Month Follow up    HPI: Patient is a 85 y.o. male seen today for medical management of chronic diseases.    MMSE 26/30 this year, was 27/30 last year.  He repeats himself considerably more than when we first met.    Has some abdominal pain on the left side at times when trying to sleep.  He is on pepcid prn for indigestion but that's different.  No changes with his bms.  No blood in the stool.  Sometimes keeps him up a little bit.  There is what he reports to be chronic prominence of his left ribs which is more obvious than it once was.  He does not want this investigated with tests and prefers to continue his prn tramadol.  He has his right anterior wall hernia that is reducible--about the size of a baseball in diameter.  It is not painful.    Past Medical History:  Diagnosis Date  . Acute kidney injury ([place]) 03/06/14  . Anemia, unspecified 03/06/14  . Arthritis   . BPH (benign prostatic hyperplasia)   . Cerebral atrophy ([place]) 03/06/14  . Cerebrovascular disease  03/06/14  . Degenerative disc disease, lumbar 03/06/14  . Diverticula, bladder acquired   . Edema 03/06/14  . GERD (gastroesophageal reflux disease)   . H/O: GI bleed 1986  . Hiatal hernia   . History of fall 03/06/14  . Hypercalcemia 03/06/14  . Hyperlipidemia   . Hypertension   . Impotence   . LFT elevation 03/06/14  . Lumbago 03/06/14  . Mallory-Weiss tear 1986  . Osteoporosis   . Rhabdomyolysis 03/06/14  . Scoliosis of cervical spine 03/06/14  . Scoliosis of lumbar spine 03/06/14  . Shingles   . Spermatocele    bilateral, left greater than right  . Troponin level elevated 03/06/14  . Weak 03/06/14    Past Surgical History:  Procedure Laterality Date  . BACK SURGERY  10/2009   ESI for surgery for lumbar spinal stenosis  . clubbed toe repair  2004  . COLONOSCOPY  08/15/2011  . INGUINAL HERNIA REPAIR Right 2006  . REPLACEMENT TOTAL KNEE BILATERAL  2007, 2008  . TONSILLECTOMY AND ADENOIDECTOMY  1939  . X-STOP IMPLANTATION      Allergies  Allergen Reactions  . Amrix [Cyclobenzaprine]   . Shellfish Allergy     Outpatient Encounter Medications as of 06/15/2019  Medication Sig  . acetaminophen (TYLENOL) 325 MG tablet Take 650 mg by mouth 2 (two) times daily.   Marland Kitchen acetaminophen (TYLENOL) 325 MG tablet Take 650 mg by  mouth every 4 (four) hours as needed.   Marland Kitchen amLODipine (NORVASC) 2.5 MG tablet Take 1 tablet by mouth daily.  Marland Kitchen antiseptic oral rinse (BIOTENE) LIQD 15 mLs by Mouth Rinse route 4 (four) times daily as needed for dry mouth.  . bisacodyl (DULCOLAX) 10 MG suppository Place 10 mg rectally as needed for moderate constipation.  . famotidine (PEPCID) 20 MG tablet Take 20 mg by mouth daily as needed for heartburn or indigestion.  . haloperidol lactate (HALDOL) 5 MG/ML injection 1.25 mg every 4 (four) hours as needed.  . lactose free nutrition (BOOST) LIQD Take 237 mLs by mouth 2 (two) times daily between meals.  Marland Kitchen LORazepam (ATIVAN) 0.5 MG tablet Take 1 tablet (0.5 mg total) by  mouth every 4 (four) hours as needed for anxiety.  . ondansetron (ZOFRAN) 4 MG tablet Take 4 mg by mouth every 6 (six) hours as needed for nausea or vomiting.  Marland Kitchen QUEtiapine (SEROQUEL) 25 MG tablet Take 25 mg by mouth 2 (two) times daily.   . traMADol (ULTRAM) 50 MG tablet Take 0.5 tablets (25 mg total) by mouth every 6 (six) hours as needed. (Patient taking differently: Take 25 mg by mouth every 4 (four) hours as needed. )  . triamcinolone cream (KENALOG) 0.1 % Apply 1 application topically as needed.  . vitamin B-12 (CYANOCOBALAMIN) 1000 MCG tablet Take 500 mcg by mouth daily.    No facility-administered encounter medications on file as of 06/15/2019.     Review of Systems:  Review of Systems  Constitutional: Positive for malaise/fatigue. Negative for chills, fever and weight loss.  HENT: Negative for congestion and sore throat.   Eyes:       Glasses  Respiratory: Negative for cough and shortness of breath.   Cardiovascular: Negative for chest pain, palpitations and leg swelling.  Gastrointestinal: Positive for abdominal pain. Negative for blood in stool, constipation, diarrhea, heartburn, melena, nausea and vomiting.       Left sided abdominal pain; not tender; right anterior wall hernia the size of a baseball present but reducible  Genitourinary: Negative for dysuria.  Musculoskeletal: Positive for back pain. Negative for falls.       Kyphoscoliosis  Skin: Negative for rash.  Neurological: Positive for weakness. Negative for dizziness, loss of consciousness and headaches.  Endo/Heme/Allergies: Bruises/bleeds easily.  Psychiatric/Behavioral: Positive for memory loss. Negative for depression and hallucinations. The patient is not nervous/anxious and does not have insomnia.        No recently reported psychosis--on seroquel    Health Maintenance  Topic Date Due  . DEXA SCAN  12/30/2014  . PNA vac Low Risk Adult (1 of 2 - PCV13) 06/12/2020 (Originally 04[birth year]01/1992)  . TETANUS/TDAP   11/12/2027  . INFLUENZA VACCINE  Discontinued    Physical Exam: Vitals:   06/15/19 1037  BP: (!) 144/82  Pulse: 61  Temp: 97.6 F (36.4 C)  TempSrc: Oral  SpO2: 97%  Weight: 154 lb 12.8 oz (70.2 kg)  Height: '5\' 7"'  (1.702 m)   Body mass index is 24.25 kg/m. Physical Exam Vitals signs reviewed.  Constitutional:      General: He is not in acute distress.    Appearance: Normal appearance. He is not toxic-appearing.     Comments: Frail male walking with rollator very slowly  HENT:     Head: Normocephalic and atraumatic.  Eyes:     Extraocular Movements: Extraocular movements intact.     Pupils: Pupils are equal, round, and reactive to light.  Comments: glasses  Neck:     Musculoskeletal: Neck supple.  Cardiovascular:     Rate and Rhythm: Normal rate and regular rhythm.     Pulses: Normal pulses.     Heart sounds: Normal heart sounds.  Pulmonary:     Effort: Pulmonary effort is normal.     Breath sounds: Normal breath sounds. No wheezing, rhonchi or rales.  Abdominal:     General: Bowel sounds are normal. There is no distension.     Palpations: Abdomen is soft. There is mass.     Tenderness: There is no abdominal tenderness. There is no right CVA tenderness, left CVA tenderness, guarding or rebound.     Hernia: A hernia is present.     Comments: Right anterior wall hernia size of a baseball which is reducible easily and not tender; no left upper quadrant tenderness (reports he gets pain there though)--has very prominent ribs--question of splenomegaly beneath vs just postural from his severe kyphoscoliosis  Musculoskeletal: Normal range of motion.     Comments: Significant kyphoscoliosis; left ribs anteriorly much more prominent, but solid mass beneath (? Splenomegaly or just due to his musculoskeletal deformity)  Lymphadenopathy:     Cervical: No cervical adenopathy.  Skin:    General: Skin is warm and dry.     Capillary Refill: Capillary refill takes less than 2  seconds.  Neurological:     General: No focal deficit present.     Mental Status: He is alert.     Cranial Nerves: No cranial nerve deficit.     Motor: Weakness present.     Gait: Gait abnormal.     Comments: Slow gait, shuffles slightly; repeats himself a bit during visit, but pleasant and appropriate  Psychiatric:        Mood and Affect: Mood normal.        Behavior: Behavior normal.     Labs reviewed: Basic Metabolic Panel: Recent Labs    02/08/19  NA 133*  K 3.7  BUN 24*  CREATININE 1.8*  TSH 5.56   Liver Function Tests: Recent Labs    02/08/19  AST 32  ALT 26  ALKPHOS 81   No results for input(s): LIPASE, AMYLASE in the last 8760 hours. No results for input(s): AMMONIA in the last 8760 hours. CBC: Recent Labs    02/08/19  WBC 7.7  NEUTROABS 4  HGB 12.4*  HCT 36*  PLT 369    Assessment/Plan 1. Hernia of anterior abdominal wall -is reducible and not bothersome to him though large in size -he's opted not to have surgery and is too frail to go through this at 89  2. Left upper quadrant pain -not describing GERD as further down; seems it's due to his rib deformity; ? splenomegaly but he does not want imaging to assess (was on hospice, but graduated)  3. Other idiopathic scoliosis, thoracolumbar region -is severe, cont prn tramadol for pain  4. Lumbar disc disease -cont tylenol and prn tramadol  5. Generalized weakness -cont rollator walker for support and assistance with daily routine  6. Malnutrition of moderate degree (HCC) -cont boost bid  7.  Acute delirium -this has resolved, but his memory has continued to decline some vs prior visits based on our interactions -remains on seroquel w/o any recent known psychosis--was during acute illness--may consider weaning this and d/c haldol  Labs/tests ordered:  No new--comfort care goals Next appt:  10/19/2019   Shanik Brookshire L. Harry Bark, D.O. [facility] [address]  [place],  [address] Cell Phone (Mon-Fri 8am-5pm):  [phone] On Call:  [phone] & follow prompts after 5pm & weekends Office Phone:  [phone] Office Fax:  [phone]

## 2019-06-20 DIAGNOSIS — K439 Ventral hernia without obstruction or gangrene: Secondary | ICD-10-CM | POA: Insufficient documentation

## 2019-06-20 DIAGNOSIS — K429 Umbilical hernia without obstruction or gangrene: Secondary | ICD-10-CM | POA: Insufficient documentation

## 2019-06-20 MED ORDER — TRAMADOL HCL 50 MG PO TABS: 25.0000 mg | ORAL_TABLET | ORAL | 0 refills | Status: DC | PRN

## 2019-06-23 ENCOUNTER — Encounter: Payer: Self-pay | Admitting: Internal Medicine

## 2019-06-29 DIAGNOSIS — Z9189 Other specified personal risk factors, not elsewhere classified: Secondary | ICD-10-CM | POA: Diagnosis not present

## 2019-06-29 DIAGNOSIS — Z20828 Contact with and (suspected) exposure to other viral communicable diseases: Secondary | ICD-10-CM | POA: Diagnosis not present

## 2019-07-13 DIAGNOSIS — Z9189 Other specified personal risk factors, not elsewhere classified: Secondary | ICD-10-CM | POA: Diagnosis not present

## 2019-07-13 DIAGNOSIS — Z20828 Contact with and (suspected) exposure to other viral communicable diseases: Secondary | ICD-10-CM | POA: Diagnosis not present

## 2019-08-15 DIAGNOSIS — Z20828 Contact with and (suspected) exposure to other viral communicable diseases: Secondary | ICD-10-CM | POA: Diagnosis not present

## 2019-08-15 DIAGNOSIS — Z9189 Other specified personal risk factors, not elsewhere classified: Secondary | ICD-10-CM | POA: Diagnosis not present

## 2019-08-22 DIAGNOSIS — Z20828 Contact with and (suspected) exposure to other viral communicable diseases: Secondary | ICD-10-CM | POA: Diagnosis not present

## 2019-08-22 DIAGNOSIS — Z9189 Other specified personal risk factors, not elsewhere classified: Secondary | ICD-10-CM | POA: Diagnosis not present

## 2019-08-29 DIAGNOSIS — Z9189 Other specified personal risk factors, not elsewhere classified: Secondary | ICD-10-CM | POA: Diagnosis not present

## 2019-08-29 DIAGNOSIS — Z20828 Contact with and (suspected) exposure to other viral communicable diseases: Secondary | ICD-10-CM | POA: Diagnosis not present

## 2019-08-30 DIAGNOSIS — Z23 Encounter for immunization: Secondary | ICD-10-CM | POA: Diagnosis not present

## 2019-09-05 DIAGNOSIS — Z9189 Other specified personal risk factors, not elsewhere classified: Secondary | ICD-10-CM | POA: Diagnosis not present

## 2019-09-05 DIAGNOSIS — Z20828 Contact with and (suspected) exposure to other viral communicable diseases: Secondary | ICD-10-CM | POA: Diagnosis not present

## 2019-09-12 DIAGNOSIS — Z9189 Other specified personal risk factors, not elsewhere classified: Secondary | ICD-10-CM | POA: Diagnosis not present

## 2019-09-12 DIAGNOSIS — Z20828 Contact with and (suspected) exposure to other viral communicable diseases: Secondary | ICD-10-CM | POA: Diagnosis not present

## 2019-09-27 DIAGNOSIS — Z23 Encounter for immunization: Secondary | ICD-10-CM | POA: Diagnosis not present

## 2019-10-19 ENCOUNTER — Non-Acute Institutional Stay: Payer: Medicare Other | Admitting: Internal Medicine

## 2019-10-19 ENCOUNTER — Other Ambulatory Visit: Payer: Self-pay

## 2019-10-19 ENCOUNTER — Encounter: Payer: Self-pay | Admitting: Internal Medicine

## 2019-10-19 VITALS — BP 140/82 | HR 70 | Temp 97.5°F | Ht 67.0 in | Wt 148.0 lb

## 2019-10-19 DIAGNOSIS — R531 Weakness: Secondary | ICD-10-CM | POA: Diagnosis not present

## 2019-10-19 DIAGNOSIS — M519 Unspecified thoracic, thoracolumbar and lumbosacral intervertebral disc disorder: Secondary | ICD-10-CM

## 2019-10-19 DIAGNOSIS — K5903 Drug induced constipation: Secondary | ICD-10-CM | POA: Diagnosis not present

## 2019-10-19 DIAGNOSIS — K219 Gastro-esophageal reflux disease without esophagitis: Secondary | ICD-10-CM | POA: Diagnosis not present

## 2019-10-19 DIAGNOSIS — K439 Ventral hernia without obstruction or gangrene: Secondary | ICD-10-CM

## 2019-10-19 DIAGNOSIS — M4125 Other idiopathic scoliosis, thoracolumbar region: Secondary | ICD-10-CM

## 2019-10-19 NOTE — Progress Notes (Signed)
Location:   [place]   Place of Service:  Clinic (12)  Provider: Kaleab Frasier L. Mariea Clonts, D.O., C.M.D.  Code Status: DNR, MOST Goals of Care:  Advanced Directives 10/19/2019  Does Patient Have a Medical Advance Directive? Yes  Type of Paramedic of [place];Out of facility DNR (pink MOST or yellow form)  Does patient want to make changes to medical advance directive? No - Patient declined  Copy of [place] in Chart? -  Would patient like information on creating a medical advance directive? -  Pre-existing out of facility DNR order (yellow form or pink MOST form) -     Chief Complaint  Patient presents with  . Medical Management of Chronic Issues    4 month follow up     HPI: Patient is a 86 y.o. male seen today for medical management of chronic diseases.    Back pain:  It's so so, not good, but does not hurt him really.  He is not aware of taking any tramadol for breakthrough pain.    Indigestion is doing good if he avoids the trigger foods.  Has his prn pepcid.    Bowels are moving ok.    Has a little trouble going to sleep sometimes, but has slept well the past few nights.  He's not a napper.    Weight yesterday was 153 yesterday and 148 here today.  Scale has not been quite right here today.  Appetite is good. Says food is not the greatest but does alright.    BP was at upper limits of normal.    Hernia:  Still there if he lies down, he is able to reduce it back into place w/o pain.    He is asking about a document he gets that he's calling reinsurance.  He says he tried to call where it comes from.  It says it's not a bill on it.    Left hip wants to twist a little and sometimes bothers him a bit--that leg wants to get shorter.  He has a bit more swelling on that side, too.    Tells me about the nerve damage that happened when he had surgery that's caused his chronic incontinence.  Past Medical History:  Diagnosis Date  . Acute  kidney injury ([place]) 03/06/14  . Anemia, unspecified 03/06/14  . Arthritis   . BPH (benign prostatic hyperplasia)   . Cerebral atrophy ([place]) 03/06/14  . Cerebrovascular disease 03/06/14  . Degenerative disc disease, lumbar 03/06/14  . Diverticula, bladder acquired   . Edema 03/06/14  . GERD (gastroesophageal reflux disease)   . H/O: GI bleed 1986  . Hiatal hernia   . History of fall 03/06/14  . Hypercalcemia 03/06/14  . Hyperlipidemia   . Hypertension   . Impotence   . LFT elevation 03/06/14  . Lumbago 03/06/14  . Mallory-Weiss tear 1986  . Osteoporosis   . Rhabdomyolysis 03/06/14  . Scoliosis of cervical spine 03/06/14  . Scoliosis of lumbar spine 03/06/14  . Shingles   . Spermatocele    bilateral, left greater than right  . Troponin level elevated 03/06/14  . Weak 03/06/14    Past Surgical History:  Procedure Laterality Date  . BACK SURGERY  10/2009   ESI for surgery for lumbar spinal stenosis  . clubbed toe repair  2004  . COLONOSCOPY  08/15/2011  . INGUINAL HERNIA REPAIR Right 2006  . REPLACEMENT TOTAL KNEE BILATERAL  2007, 2008  . TONSILLECTOMY AND  ADENOIDECTOMY  1939  . X-STOP IMPLANTATION      Allergies  Allergen Reactions  . Amrix [Cyclobenzaprine]   . Shellfish Allergy     Outpatient Encounter Medications as of 10/19/2019  Medication Sig  . acetaminophen (TYLENOL) 325 MG tablet Take 650 mg by mouth 2 (two) times daily.   Marland Kitchen acetaminophen (TYLENOL) 325 MG tablet Take 650 mg by mouth every 4 (four) hours as needed.   Marland Kitchen amLODipine (NORVASC) 2.5 MG tablet Take 1 tablet by mouth daily.  Marland Kitchen antiseptic oral rinse (BIOTENE) LIQD 15 mLs by Mouth Rinse route 4 (four) times daily as needed for dry mouth.  . bisacodyl (DULCOLAX) 10 MG suppository Place 10 mg rectally as needed for moderate constipation.  . famotidine (PEPCID) 20 MG tablet Take 20 mg by mouth daily as needed for heartburn or indigestion.  . haloperidol lactate (HALDOL) 5 MG/ML injection 1.25 mg every 4 (four) hours  as needed.  . lactose free nutrition (BOOST) LIQD Take 237 mLs by mouth 2 (two) times daily between meals.  Marland Kitchen LORazepam (ATIVAN) 0.5 MG tablet Take 1 tablet (0.5 mg total) by mouth every 4 (four) hours as needed for anxiety.  . ondansetron (ZOFRAN) 4 MG tablet Take 4 mg by mouth every 6 (six) hours as needed for nausea or vomiting.  Marland Kitchen QUEtiapine (SEROQUEL) 25 MG tablet Take 25 mg by mouth 2 (two) times daily.   . traMADol (ULTRAM) 50 MG tablet Take 0.5 tablets (25 mg total) by mouth every 4 (four) hours as needed.  . triamcinolone cream (KENALOG) 0.1 % Apply 1 application topically as needed.  . vitamin B-12 (CYANOCOBALAMIN) 1000 MCG tablet Take 500 mcg by mouth daily.    No facility-administered encounter medications on file as of 10/19/2019.    Review of Systems:  Review of Systems  Constitutional: Negative for chills, fever and malaise/fatigue.  HENT: Negative for congestion and sore throat.   Eyes: Negative for blurred vision.  Respiratory: Negative for cough and shortness of breath.   Cardiovascular: Negative for chest pain, palpitations and leg swelling.  Gastrointestinal: Negative for blood in stool, constipation, diarrhea and melena.       Hernia remains reducible  Genitourinary: Negative for dysuria.  Musculoskeletal: Positive for back pain. Negative for falls.  Skin: Negative for itching and rash.  Neurological: Negative for dizziness and loss of consciousness.  Psychiatric/Behavioral: Positive for memory loss. Negative for depression. The patient is not nervous/anxious.     Health Maintenance  Topic Date Due  . DEXA SCAN  12/30/2014  . PNA vac Low Risk Adult (1 of 2 - PCV13) 06/12/2020 (Originally 04[birth year]01/1992)  . TETANUS/TDAP  11/12/2027  . INFLUENZA VACCINE  Discontinued    Physical Exam: Vitals:   10/19/19 1107  BP: 140/82  Pulse: 70  Temp: (!) 97.5 F (36.4 C)  TempSrc: Temporal  SpO2: 97%  Weight: 148 lb (67.1 kg)  Height: 5\' 7"  (1.702 m)   Body mass index  is 23.18 kg/m. Physical Exam Vitals reviewed.  Constitutional:      General: He is not in acute distress.    Appearance: Normal appearance. He is not toxic-appearing.     Comments: Frail male   HENT:     Head: Normocephalic and atraumatic.  Cardiovascular:     Rate and Rhythm: Normal rate and regular rhythm.     Pulses: Normal pulses.     Heart sounds: Normal heart sounds.  Pulmonary:     Effort: Pulmonary effort is normal.  Breath sounds: Normal breath sounds.  Abdominal:     General: Bowel sounds are normal.     Comments: Hernia remains reducible right ventral abdomen  Musculoskeletal:     Comments: Severe kyphosis, ambulates with rollator  Skin:    General: Skin is warm and dry.  Neurological:     General: No focal deficit present.     Mental Status: He is alert.  Psychiatric:        Mood and Affect: Mood normal.     Labs reviewed: Basic Metabolic Panel: Recent Labs    02/08/19 0000  NA 133*  K 3.7  BUN 24*  CREATININE 1.8*  TSH 5.56   Liver Function Tests: Recent Labs    02/08/19 0000  AST 32  ALT 26  ALKPHOS 81   No results for input(s): LIPASE, AMYLASE in the last 8760 hours. No results for input(s): AMMONIA in the last 8760 hours. CBC: Recent Labs    02/08/19 0000  WBC 7.7  NEUTROABS 4  HGB 12.4*  HCT 36*  PLT 369   Lipid Panel: No results for input(s): CHOL, HDL, LDLCALC, TRIG, CHOLHDL, LDLDIRECT in the last 8760 hours. No results found for: HGBA1C  Assessment/Plan 1. Hernia of anterior abdominal wall -remains reducible, not having significant discomfort -goals are comfort based--no plans for surgery  2. Other idiopathic scoliosis, thoracolumbar region -persists, has some chronic back pain, has tylenol, tramadol if severe  3. Lumbar disc disease -as in #2, cont current mgt  4. Drug-induced constipation -bowels are moving with current regimen, encouraged hydration and walking to help  5. Gastroesophageal reflux disease,  unspecified whether esophagitis present -cont pepcid  6. Generalized weakness -ongoing, cont rollator use, seems to be moving slower each visit; has caregiver assistance in AL  Continues to have prn ativan, scheduled seroquel bid and prn haldol--he's not used the prns at all in months, but his delirium was severe when he had it so we want it to be accessible if it recurs with his comfort-based goals in mind. I may try reduce the seroquel to 12.5mg  po bid when I see him next.    Labs/tests ordered:  No new Next appt:  4 mos med mgt  Timothy Trudell L. Nolon Yellin, D.O. [facility] [address], [place] [address] Cell Phone (Mon-Fri 8am-5pm):  [phone] On Call:  [phone] & follow prompts after 5pm & weekends Office Phone:  [phone] Office Fax:  [phone]

## 2019-11-16 DIAGNOSIS — R319 Hematuria, unspecified: Secondary | ICD-10-CM | POA: Diagnosis not present

## 2019-11-16 DIAGNOSIS — D649 Anemia, unspecified: Secondary | ICD-10-CM | POA: Diagnosis not present

## 2019-11-16 DIAGNOSIS — E119 Type 2 diabetes mellitus without complications: Secondary | ICD-10-CM | POA: Diagnosis not present

## 2019-11-16 DIAGNOSIS — D509 Iron deficiency anemia, unspecified: Secondary | ICD-10-CM | POA: Diagnosis not present

## 2019-11-16 DIAGNOSIS — I10 Essential (primary) hypertension: Secondary | ICD-10-CM | POA: Diagnosis not present

## 2019-11-16 DIAGNOSIS — Z79899 Other long term (current) drug therapy: Secondary | ICD-10-CM | POA: Diagnosis not present

## 2019-12-27 DIAGNOSIS — M79671 Pain in right foot: Secondary | ICD-10-CM | POA: Diagnosis not present

## 2019-12-27 DIAGNOSIS — B351 Tinea unguium: Secondary | ICD-10-CM | POA: Diagnosis not present

## 2019-12-27 DIAGNOSIS — M79672 Pain in left foot: Secondary | ICD-10-CM | POA: Diagnosis not present

## 2020-02-29 ENCOUNTER — Encounter: Payer: Self-pay | Admitting: Internal Medicine

## 2020-02-29 ENCOUNTER — Other Ambulatory Visit: Payer: Self-pay

## 2020-02-29 ENCOUNTER — Non-Acute Institutional Stay: Payer: Medicare Other | Admitting: Internal Medicine

## 2020-02-29 VITALS — BP 118/62 | HR 47 | Temp 97.7°F | Ht 68.0 in | Wt 158.2 lb

## 2020-02-29 DIAGNOSIS — F22 Delusional disorders: Secondary | ICD-10-CM | POA: Diagnosis not present

## 2020-02-29 DIAGNOSIS — R001 Bradycardia, unspecified: Secondary | ICD-10-CM

## 2020-02-29 DIAGNOSIS — M519 Unspecified thoracic, thoracolumbar and lumbosacral intervertebral disc disorder: Secondary | ICD-10-CM

## 2020-02-29 DIAGNOSIS — E440 Moderate protein-calorie malnutrition: Secondary | ICD-10-CM | POA: Diagnosis not present

## 2020-02-29 DIAGNOSIS — R531 Weakness: Secondary | ICD-10-CM | POA: Diagnosis not present

## 2020-02-29 DIAGNOSIS — K439 Ventral hernia without obstruction or gangrene: Secondary | ICD-10-CM | POA: Diagnosis not present

## 2020-02-29 DIAGNOSIS — M4125 Other idiopathic scoliosis, thoracolumbar region: Secondary | ICD-10-CM

## 2020-02-29 DIAGNOSIS — N1831 Chronic kidney disease, stage 3a: Secondary | ICD-10-CM

## 2020-02-29 NOTE — Progress Notes (Signed)
Location:  Occupational psychologist of Service:  Clinic (12)  Provider: Avelino Herren L. Mariea Clonts, D.O., C.M.D.  Code Status: DNR, had been on hospice, goals remain comfort-based  Goals of Care:  Advanced Directives 02/29/2020  Does Patient Have a Medical Advance Directive? Yes  Type of Paramedic of [place];Out of facility DNR (pink MOST or yellow form);Living will  Does patient want to make changes to medical advance directive? No - Patient declined  Copy of [place] in Chart? Yes - validated most recent copy scanned in chart (See row information)  Would patient like information on creating a medical advance directive? -  Pre-existing out of facility DNR order (yellow form or pink MOST form) Pink MOST/Yellow Form most recent copy in chart - Physician notified to receive inpatient order     Chief Complaint  Patient presents with  . Medical Management of Chronic Issues    4 month follow up     HPI: Patient is a 86 y.o. male seen today for medical management of chronic diseases.  He lives in [place].   He goes to get up from lying down for a while, he is dizzy sometimes.  HR today 47. Does run brady in 60s at baseline.  EKG done today to eval.  Sleeps well.  Appetite still good.  Loves fish and eats that a lot here.    Back still gives him a little problem.  Nothing he can't hand.  Twists a little bit.  Walks with his walker.  Tries to a little each day to make sure he keeps it up.    Hernia is still there, but continues to be reducible at night.  Moving bowels ok.    I've gotten reports that he's had more paranoia about finances lately.    Says his daughter cannot pass up sweet things and has had be hospitalized in [place] for her diabetes.    Upon speaking with the social worker, I was advised that he's had some increased paranoia when talking with family by phone--he thinks people/[facility] are/is taking his money (and not  just that it's expensive).  Past Medical History:  Diagnosis Date  . Acute kidney injury ([place]) 03/06/14  . Anemia, unspecified 03/06/14  . Arthritis   . BPH (benign prostatic hyperplasia)   . Cerebral atrophy ([place]) 03/06/14  . Cerebrovascular disease 03/06/14  . Degenerative disc disease, lumbar 03/06/14  . Diverticula, bladder acquired   . Edema 03/06/14  . GERD (gastroesophageal reflux disease)   . H/O: GI bleed 1986  . Hiatal hernia   . History of fall 03/06/14  . Hypercalcemia 03/06/14  . Hyperlipidemia   . Hypertension   . Impotence   . LFT elevation 03/06/14  . Lumbago 03/06/14  . Mallory-Weiss tear 1986  . Osteoporosis   . Rhabdomyolysis 03/06/14  . Scoliosis of cervical spine 03/06/14  . Scoliosis of lumbar spine 03/06/14  . Shingles   . Spermatocele    bilateral, left greater than right  . Troponin level elevated 03/06/14  . Weak 03/06/14    Past Surgical History:  Procedure Laterality Date  . BACK SURGERY  10/2009   ESI for surgery for lumbar spinal stenosis  . clubbed toe repair  2004  . COLONOSCOPY  08/15/2011  . INGUINAL HERNIA REPAIR Right 2006  . REPLACEMENT TOTAL KNEE BILATERAL  2007, 2008  . TONSILLECTOMY AND ADENOIDECTOMY  1939  . X-STOP IMPLANTATION      Allergies  Allergen Reactions  . Amrix [Cyclobenzaprine]   . Shellfish Allergy     Outpatient Encounter Medications as of 02/29/2020  Medication Sig  . acetaminophen (TYLENOL) 325 MG tablet Take 650 mg by mouth 2 (two) times daily.   Marland Kitchen acetaminophen (TYLENOL) 325 MG tablet Take 650 mg by mouth every 4 (four) hours as needed.   Marland Kitchen amLODipine (NORVASC) 2.5 MG tablet Take 1 tablet by mouth daily.  Marland Kitchen antiseptic oral rinse (BIOTENE) LIQD 15 mLs by Mouth Rinse route 4 (four) times daily as needed for dry mouth.  . bisacodyl (DULCOLAX) 10 MG suppository Place 10 mg rectally as needed for moderate constipation.  . famotidine (PEPCID) 20 MG tablet Take 20 mg by mouth daily as needed for heartburn or indigestion.    . haloperidol lactate (HALDOL) 5 MG/ML injection 1.25 mg every 4 (four) hours as needed.  . lactose free nutrition (BOOST) LIQD Take 237 mLs by mouth 2 (two) times daily between meals.  Marland Kitchen LORazepam (ATIVAN) 0.5 MG tablet Take 1 tablet (0.5 mg total) by mouth every 4 (four) hours as needed for anxiety.  . ondansetron (ZOFRAN) 4 MG tablet Take 4 mg by mouth every 6 (six) hours as needed for nausea or vomiting.  . traMADol (ULTRAM) 50 MG tablet Take 0.5 tablets (25 mg total) by mouth every 4 (four) hours as needed.  . triamcinolone cream (KENALOG) 0.1 % Apply 1 application topically as needed.  . vitamin B-12 (CYANOCOBALAMIN) 1000 MCG tablet Take 500 mcg by mouth daily.   . QUEtiapine (SEROQUEL) 25 MG tablet Take 25 mg by mouth 2 (two) times daily.    No facility-administered encounter medications on file as of 02/29/2020.    Review of Systems:  Review of Systems  Constitutional: Positive for malaise/fatigue. Negative for chills and fever.       Up 10 lbs since march visit  HENT: Negative for congestion and sore throat.   Eyes: Negative for blurred vision.       Continues to read the paper  Respiratory: Negative for shortness of breath.   Cardiovascular: Positive for leg swelling (managed fine with compression socks). Negative for chest pain and palpitations.  Gastrointestinal: Positive for constipation. Negative for abdominal pain, blood in stool, diarrhea and melena.  Genitourinary: Negative for dysuria.       Some incontinence  Musculoskeletal: Positive for back pain. Negative for falls and joint pain.  Skin: Negative for itching and rash.  Neurological: Negative for dizziness and loss of consciousness.  Psychiatric/Behavioral: Positive for memory loss. Negative for depression. The patient is not nervous/anxious and does not have insomnia.        Paranoid delusions    Health Maintenance  Topic Date Due  . PNA vac Low Risk Adult (1 of 2 - PCV13) 06/12/2020 (Originally [birth year]01/1992)  .  DEXA SCAN  02/28/2021 (Originally 12/30/2014)  . TETANUS/TDAP  11/12/2027  . COVID-19 Vaccine  Completed  . INFLUENZA VACCINE  Discontinued    Physical Exam: Vitals:   02/29/20 1332  BP: 118/62  Pulse: (!) 47  Temp: 97.7 F (36.5 C)  TempSrc: Temporal  SpO2: 96%  Weight: 158 lb 3.2 oz (71.8 kg)  Height: 5\' 8"  (1.727 m)   Body mass index is 24.05 kg/m. Physical Exam Vitals reviewed.  Constitutional:      General: He is not in acute distress.    Appearance: Normal appearance. He is not toxic-appearing.  HENT:     Head: Normocephalic and atraumatic.  Cardiovascular:  Rate and Rhythm: Bradycardia present.     Comments: With occasional irregularity Pulmonary:     Effort: Pulmonary effort is normal.     Breath sounds: Normal breath sounds. No wheezing, rhonchi or rales.  Abdominal:     General: Bowel sounds are normal. There is no distension.     Palpations: Abdomen is soft.     Tenderness: There is no abdominal tenderness. There is no guarding or rebound.  Musculoskeletal:        General: Normal range of motion.     Right lower leg: Edema present.     Left lower leg: Edema present.     Comments: Mild edema managed with compression socks; severe kyphoscoliosis, uses rollator walker  Skin:    General: Skin is warm and dry.  Neurological:     General: No focal deficit present.     Mental Status: He is alert and oriented to person, place, and time.     Gait: Gait abnormal.     Comments: But short-term memory loss; context not quite right at times  Psychiatric:        Mood and Affect: Mood normal.     Comments: Pleasant and appropriate; did not share any delusions with me today     Labs reviewed: no recent so will check for once a year  Basic Metabolic Panel: No results for input(s): NA, K, CL, CO2, GLUCOSE, BUN, CREATININE, CALCIUM, MG, PHOS, TSH in the last 8760 hours. Liver Function Tests: No results for input(s): AST, ALT, ALKPHOS, BILITOT, PROT, ALBUMIN in the  last 8760 hours. No results for input(s): LIPASE, AMYLASE in the last 8760 hours. No results for input(s): AMMONIA in the last 8760 hours. CBC: No results for input(s): WBC, NEUTROABS, HGB, HCT, MCV, PLT in the last 8760 hours. Lipid Panel: No results for input(s): CHOL, HDL, LDLCALC, TRIG, CHOLHDL, LDLDIRECT in the last 8760 hours. No results found for: HGBA1C  Procedures since last visit: No results found.  Assessment/Plan 1. Sinus bradycardia -notably worse today during check-in -admits to occasional dizziness -goals are comfort-based so we did check EKG after the visit (due to the family conference timing) to ensure nothing acute/critical going on, but HR had improved some by then  -he was not having active symptoms and irregularity turned out to be PVCs -would not send for further eval as goals are palliative  2. Malnutrition of moderate degree (HCC) -appears he's doing much better here since he's up 10 lbs post covid isolation  3. Lumbar disc disease -has chronic back pain from this and his scoliosis -has tramadol for severe pain and tylenol scheduled and prn for mild pain--fortunately not used at 650mg  po bid routinely plus q4h prn b/c that would be 5200mg  per day -reduce tylenol prn to bid prn also   4. Hernia of anterior abdominal wall -continues to pop out in the day, but reducible at night w/o new discomfort or symptoms -cont bowel regimen to prevent obstruction  5. Other idiopathic scoliosis, thoracolumbar region -cont pain mgt plan as above  6. Stage 3a chronic kidney disease -Avoid nephrotoxic agents like nsaids, dose adjust renally excreted meds, hydrate.  7. Generalized weakness -continue use of rollator walker and AL support, remains ambulatory, just slower that before  8. Paranoid delusion ([place]) -continues on seroquel bid  -has not needed the prn haldol in a long time and I stopped it based on pharmacy review sometime since last visit -these were started  amid acute delirium, but he continues  with paranoid delusions at times that are bothersome to him   Labs/tests ordered: cbc, cmp, tsh next draw Next appt:  6 mos med mgt  Jennilee Demarco L. Johanny Segers, D.O. [facility] [address], [place] [address] Cell Phone (Mon-Fri 8am-5pm):  [phone] On Call:  [phone] & follow prompts after 5pm & weekends Office Phone:  [phone] Office Fax:  [phone]

## 2020-03-01 DIAGNOSIS — Z79899 Other long term (current) drug therapy: Secondary | ICD-10-CM | POA: Diagnosis not present

## 2020-03-01 DIAGNOSIS — E119 Type 2 diabetes mellitus without complications: Secondary | ICD-10-CM | POA: Diagnosis not present

## 2020-03-01 DIAGNOSIS — R739 Hyperglycemia, unspecified: Secondary | ICD-10-CM | POA: Diagnosis not present

## 2020-03-01 DIAGNOSIS — E039 Hypothyroidism, unspecified: Secondary | ICD-10-CM | POA: Diagnosis not present

## 2020-03-01 DIAGNOSIS — D649 Anemia, unspecified: Secondary | ICD-10-CM | POA: Diagnosis not present

## 2020-03-01 LAB — COMPREHENSIVE METABOLIC PANEL
Albumin: 3.4 — AB (ref 3.5–5.0)
Calcium: 8.3 — AB (ref 8.7–10.7)
GFR calc Af Amer: 38.61
GFR calc non Af Amer: 33.32

## 2020-03-01 LAB — TSH: TSH: 7.28 — AB (ref 0.41–5.90)

## 2020-03-01 LAB — CBC AND DIFFERENTIAL
HCT: 33 — AB (ref 41–53)
Hemoglobin: 11 — AB (ref 13.5–17.5)
Platelets: 257 (ref 150–399)
WBC: 6

## 2020-03-01 LAB — HEMOGLOBIN A1C: Hemoglobin A1C: 5.8

## 2020-03-01 LAB — HEPATIC FUNCTION PANEL
ALT: 13 (ref 10–40)
AST: 14 (ref 14–40)
Alkaline Phosphatase: 66 (ref 25–125)
Bilirubin, Direct: 0.2 (ref 0.01–0.4)
Bilirubin, Total: 0.3

## 2020-03-01 LAB — BASIC METABOLIC PANEL
BUN: 27 — AB (ref 4–21)
CO2: 24 — AB (ref 13–22)
Chloride: 106 (ref 99–108)
Creatinine: 1.7 — AB (ref 0.6–1.3)
Glucose: 69
Potassium: 4.7 (ref 3.4–5.3)
Sodium: 141 (ref 137–147)

## 2020-03-01 LAB — CBC: RBC: 3.62 — AB (ref 3.87–5.11)

## 2020-04-02 DIAGNOSIS — Z9189 Other specified personal risk factors, not elsewhere classified: Secondary | ICD-10-CM | POA: Diagnosis not present

## 2020-04-02 DIAGNOSIS — Z20828 Contact with and (suspected) exposure to other viral communicable diseases: Secondary | ICD-10-CM | POA: Diagnosis not present

## 2020-04-09 DIAGNOSIS — Z9189 Other specified personal risk factors, not elsewhere classified: Secondary | ICD-10-CM | POA: Diagnosis not present

## 2020-04-09 DIAGNOSIS — Z20828 Contact with and (suspected) exposure to other viral communicable diseases: Secondary | ICD-10-CM | POA: Diagnosis not present

## 2020-04-12 ENCOUNTER — Encounter: Payer: Self-pay | Admitting: Internal Medicine

## 2020-04-12 DIAGNOSIS — E039 Hypothyroidism, unspecified: Secondary | ICD-10-CM | POA: Diagnosis not present

## 2020-04-17 ENCOUNTER — Other Ambulatory Visit: Payer: Self-pay | Admitting: Internal Medicine

## 2020-04-17 DIAGNOSIS — E039 Hypothyroidism, unspecified: Secondary | ICD-10-CM

## 2020-04-17 MED ORDER — LEVOTHYROXINE SODIUM 25 MCG PO TABS: 25.0000 ug | ORAL_TABLET | Freq: Every day | ORAL | 11 refills | Status: DC

## 2020-05-30 DIAGNOSIS — R001 Bradycardia, unspecified: Secondary | ICD-10-CM | POA: Diagnosis not present

## 2020-05-31 DIAGNOSIS — H25811 Combined forms of age-related cataract, right eye: Secondary | ICD-10-CM | POA: Diagnosis not present

## 2020-05-31 DIAGNOSIS — H2511 Age-related nuclear cataract, right eye: Secondary | ICD-10-CM | POA: Diagnosis not present

## 2020-05-31 DIAGNOSIS — H21561 Pupillary abnormality, right eye: Secondary | ICD-10-CM | POA: Diagnosis not present

## 2020-06-14 DIAGNOSIS — H5709 Other anomalies of pupillary function: Secondary | ICD-10-CM | POA: Diagnosis not present

## 2020-06-14 DIAGNOSIS — H25812 Combined forms of age-related cataract, left eye: Secondary | ICD-10-CM | POA: Diagnosis not present

## 2020-06-14 DIAGNOSIS — H2512 Age-related nuclear cataract, left eye: Secondary | ICD-10-CM | POA: Diagnosis not present

## 2020-07-04 ENCOUNTER — Other Ambulatory Visit: Payer: Self-pay | Admitting: Internal Medicine

## 2020-07-04 MED ORDER — MELATONIN 5 MG PO CAPS: 1.0000 | ORAL_CAPSULE | Freq: Every day | ORAL | 11 refills | Status: AC

## 2020-07-04 MED ORDER — PANTOPRAZOLE SODIUM 40 MG PO TBEC
40.0000 mg | DELAYED_RELEASE_TABLET | Freq: Every day | ORAL | 11 refills | Status: DC
Start: 2020-07-04 — End: 2020-11-30

## 2020-07-04 MED ORDER — FAMOTIDINE 20 MG PO TABS: 20.0000 mg | ORAL_TABLET | Freq: Every day | ORAL | 0 refills | Status: DC | PRN

## 2020-07-24 DIAGNOSIS — M79672 Pain in left foot: Secondary | ICD-10-CM | POA: Diagnosis not present

## 2020-07-24 DIAGNOSIS — M79671 Pain in right foot: Secondary | ICD-10-CM | POA: Diagnosis not present

## 2020-07-24 DIAGNOSIS — B351 Tinea unguium: Secondary | ICD-10-CM | POA: Diagnosis not present

## 2020-08-02 ENCOUNTER — Non-Acute Institutional Stay: Payer: Medicare Other | Admitting: Adult Health

## 2020-08-02 ENCOUNTER — Encounter: Payer: Self-pay | Admitting: Adult Health

## 2020-08-02 DIAGNOSIS — K219 Gastro-esophageal reflux disease without esophagitis: Secondary | ICD-10-CM

## 2020-08-02 DIAGNOSIS — R197 Diarrhea, unspecified: Secondary | ICD-10-CM

## 2020-08-02 DIAGNOSIS — K439 Ventral hernia without obstruction or gangrene: Secondary | ICD-10-CM | POA: Diagnosis not present

## 2020-08-02 NOTE — Progress Notes (Signed)
Location:  Occupational psychologist of Service:  ALF (13) Provider:   Cindi Carbon, ANP [place] [phone]   Gregory Curry, DO  Patient Care Team: Gregory Curry, DO as PCP - General (Geriatric Medicine)  Extended Emergency Contact Information Primary Emergency Contact: [name] Address: [place], [place] [address] [name] of [place] Phone: [phone] Mobile Phone: [phone] Relation: Daughter Secondary Emergency Contact: [name] Address: [address]          [place], [place] [address] [place] Phone: [phone] Work Phone: [phone] Relation: Other  Code Status:  DNR Goals of care: Advanced Directive information Advanced Directives 02/29/2020  Does Patient Have a Medical Advance Directive? Yes  Type of Paramedic of [place];Out of facility DNR (pink MOST or yellow form);Living will  Does patient want to make changes to medical advance directive? No - Patient declined  Copy of [place] in Chart? Yes - validated most recent copy scanned in chart (See row information)  Would patient like information on creating a medical advance directive? -  Pre-existing out of facility DNR order (yellow form or pink MOST form) Pink MOST/Yellow Form most recent copy in chart - Physician notified to receive inpatient order     Chief Complaint  Patient presents with   Acute Visit    diarrhea    HPI:  Pt is a 86 y.o. male seen today for an acute visit for diarrhea. [name] has underlying memory loss and resides in [place]. The nurse wrote an SBAR due to diarrhea. He reports the symptoms have been present for two weeks. He has loose stools 1-3 per day per the pt. SBAR sats 2-3 per shift. The stools are watery. No fever, abd pain, nausea, low appetite, body aches, distention, etc. Rapid covid swab checked and negative. He has scoliosis  and his daughter reports that he has had diarrhea due to a nerve problem associated with this as well as IBS. The patient denies this hx. He was started on Protonix on 11/17 due to nausea at night thought to be due to reflux. He has a hx of hiatal hernia and GERD. He denies any nausea at this time and states he is sleeping well. He has an abd wall hernia and prior abd surgery for hernia repair. This area is unchanged with no distention per pt.    Past Medical History:  Diagnosis Date   Acute kidney injury ([place]) 03/06/14   Anemia, unspecified 03/06/14   Arthritis    BPH (benign prostatic hyperplasia)    Cerebral atrophy ([place]) 03/06/14   Cerebrovascular disease 03/06/14   Degenerative disc disease, lumbar 03/06/14   Diverticula, bladder acquired    Edema 03/06/14   GERD (gastroesophageal reflux disease)    H/O: GI bleed 1986   Hiatal hernia    History of fall 03/06/14   Hypercalcemia 03/06/14   Hyperlipidemia    Hypertension    Impotence    LFT elevation 03/06/14   Lumbago 03/06/14   Mallory-Weiss tear 1986   Osteoporosis    Rhabdomyolysis 03/06/14   Scoliosis of cervical spine 03/06/14   Scoliosis of lumbar spine 03/06/14   Shingles    Spermatocele    bilateral, left greater than right   Troponin level elevated 03/06/14   Weak 03/06/14   Past Surgical History:  Procedure Laterality Date   BACK SURGERY  10/2009   ESI for surgery for lumbar spinal stenosis   clubbed toe repair  2004   COLONOSCOPY  08/15/2011   INGUINAL HERNIA REPAIR Right 2006   REPLACEMENT TOTAL KNEE BILATERAL  2007, 2008   TONSILLECTOMY AND ADENOIDECTOMY  1939   X-STOP IMPLANTATION      Allergies  Allergen Reactions   Amrix [Cyclobenzaprine]    Shellfish Allergy     Outpatient Encounter Medications as of 08/02/2020  Medication Sig   acetaminophen (TYLENOL) 325 MG tablet Take 650 mg by mouth 2 (two) times daily.    acetaminophen (TYLENOL) 325 MG tablet Take 650 mg by mouth  2 (two) times daily as needed.   amLODipine (NORVASC) 2.5 MG tablet Take 1 tablet by mouth daily.   antiseptic oral rinse (BIOTENE) LIQD 15 mLs by Mouth Rinse route 4 (four) times daily as needed for dry mouth.   bisacodyl (DULCOLAX) 10 MG suppository Place 10 mg rectally as needed for moderate constipation.   famotidine (PEPCID) 20 MG tablet Take 1 tablet (20 mg total) by mouth daily as needed for up to 14 days for heartburn or indigestion.   lactose free nutrition (BOOST) LIQD Take 237 mLs by mouth 2 (two) times daily between meals.   levothyroxine (SYNTHROID) 25 MCG tablet Take 1 tablet (25 mcg total) by mouth daily before breakfast.   Melatonin 5 MG CAPS Take 1 capsule (5 mg total) by mouth at bedtime.   ondansetron (ZOFRAN) 4 MG tablet Take 4 mg by mouth every 6 (six) hours as needed for nausea or vomiting.   pantoprazole (PROTONIX) 40 MG tablet Take 1 tablet (40 mg total) by mouth daily before supper.   QUEtiapine (SEROQUEL) 25 MG tablet Take 25 mg by mouth 2 (two) times daily.    triamcinolone cream (KENALOG) 0.1 % Apply 1 application topically as needed.   vitamin B-12 (CYANOCOBALAMIN) 1000 MCG tablet Take 500 mcg by mouth daily.    [DISCONTINUED] LORazepam (ATIVAN) 0.5 MG tablet Take 1 tablet (0.5 mg total) by mouth every 4 (four) hours as needed for anxiety.   [DISCONTINUED] traMADol (ULTRAM) 50 MG tablet Take 0.5 tablets (25 mg total) by mouth every 4 (four) hours as needed.   No facility-administered encounter medications on file as of 08/02/2020.    Review of Systems  Constitutional: Negative for activity change, appetite change, chills, diaphoresis, fatigue, fever and unexpected weight change.  Respiratory: Negative for cough, shortness of breath, wheezing and stridor.   Cardiovascular: Negative for chest pain, palpitations and leg swelling.  Gastrointestinal: Positive for diarrhea. Negative for abdominal distention, abdominal pain, blood in stool, constipation,  nausea, rectal pain and vomiting.  Genitourinary: Negative for decreased urine volume, difficulty urinating, dysuria and hematuria.  Musculoskeletal: Positive for gait problem. Negative for arthralgias, back pain, joint swelling and myalgias.  Neurological: Negative for dizziness, seizures, syncope, facial asymmetry, speech difficulty, weakness and headaches.  Hematological: Negative for adenopathy. Does not bruise/bleed easily.  Psychiatric/Behavioral: Positive for confusion. Negative for agitation and behavioral problems.    Immunization History  Administered Date(s) Administered   Moderna Sars-Covid-2 Vaccination 09/09/2019, 09/27/2019   Td 09/02/2007   Tdap 11/11/2017   Pertinent  Health Maintenance Due  Topic Date Due   URINE MICROALBUMIN  Never done   PNA vac Low Risk Adult (1 of 2 - PCV13) Never done   DEXA SCAN  02/28/2021 (Originally 12/30/2014)   INFLUENZA VACCINE  Discontinued   Fall Risk  02/29/2020 10/19/2019 06/13/2019 03/16/2019 05/12/2018  Falls in the past year?  0 0 0 0 No  Number falls in past yr: 0 0 0 0 -  Injury with Fall? 0 0 0 0 -  Risk for fall due to : - - Impaired balance/gait - -  Follow up - - Falls evaluation completed - -   Functional Status Survey:    Vitals:   08/02/20 1631  BP: 115/72  Pulse: 64  Resp: 18  Temp: 97.9 F (36.6 C)  SpO2: 98%   There is no height or weight on file to calculate BMI. Physical Exam Vitals and nursing note reviewed.  Constitutional:      General: He is not in acute distress.    Appearance: He is not diaphoretic.     Comments: Frail thin male  HENT:     Head: Normocephalic and atraumatic.     Mouth/Throat:     Mouth: Mucous membranes are moist.     Pharynx: Oropharynx is clear. No oropharyngeal exudate.  Neck:     Thyroid: No thyromegaly.     Vascular: No JVD.     Trachea: No tracheal deviation.  Cardiovascular:     Rate and Rhythm: Normal rate and regular rhythm.     Heart sounds: No murmur  heard.   Pulmonary:     Effort: Pulmonary effort is normal. No respiratory distress.     Breath sounds: Normal breath sounds. No wheezing.  Abdominal:     General: Abdomen is flat. Bowel sounds are normal. There is no distension.     Palpations: Abdomen is soft.     Tenderness: There is no abdominal tenderness. There is no right CVA tenderness, left CVA tenderness, guarding or rebound.     Hernia: A hernia (abd wall, not reducible. Soft not tender ) is present.  Musculoskeletal:        General: Deformity (severe scoliosis and kyphosis) present.     Comments: BLE edeam +1  Lymphadenopathy:     Cervical: No cervical adenopathy.  Skin:    General: Skin is warm and dry.  Neurological:     Mental Status: He is alert and oriented to person, place, and time.     Cranial Nerves: No cranial nerve deficit.  Psychiatric:        Mood and Affect: Mood and affect normal.     Labs reviewed: Recent Labs    03/01/20 0000  NA 141  K 4.7  CL 106  CO2 24*  BUN 27*  CREATININE 1.7*  CALCIUM 8.3*   Recent Labs    03/01/20 0000  AST 14  ALT 13  ALKPHOS 66  ALBUMIN 3.4*   Recent Labs    03/01/20 0000  WBC 6.0  HGB 11.0*  HCT 33*  PLT 257   Lab Results  Component Value Date   TSH 7.28 (A) 03/01/2020   Lab Results  Component Value Date   HGBA1C 5.8 03/01/2020   No results found for: CHOL, HDL, LDLCALC, LDLDIRECT, TRIG, CHOLHDL  Significant Diagnostic Results in last 30 days:  No results found.  Assessment/Plan  1. Diarrhea, unspecified type Mild and does not appear acutely ill Check c diff, if negative may use imodium 2 mg q 8 prn Continue bland diet  If this continues would hold Protonix and see if symptoms improve due to possible s/e  2. Gastroesophageal reflux disease without esophagitis Improved with Protonix per pt, see above   3. Hernia of anterior abdominal wall Unchanged on exam with no abd or fever.    Family/ staff  Communication: discussed with his  nurse  Labs/tests ordered:  Covid swab, cdiff specimen

## 2020-08-03 ENCOUNTER — Encounter: Payer: Self-pay | Admitting: Internal Medicine

## 2020-08-03 DIAGNOSIS — K580 Irritable bowel syndrome with diarrhea: Secondary | ICD-10-CM | POA: Diagnosis not present

## 2020-10-08 ENCOUNTER — Encounter: Payer: Self-pay | Admitting: Internal Medicine

## 2020-10-17 ENCOUNTER — Non-Acute Institutional Stay: Payer: Medicare Other | Admitting: Internal Medicine

## 2020-10-17 ENCOUNTER — Other Ambulatory Visit: Payer: Self-pay

## 2020-10-17 ENCOUNTER — Encounter: Payer: Self-pay | Admitting: Internal Medicine

## 2020-10-17 VITALS — BP 124/76 | HR 70 | Temp 97.9°F | Ht 68.0 in | Wt 152.6 lb

## 2020-10-17 DIAGNOSIS — L700 Acne vulgaris: Secondary | ICD-10-CM

## 2020-10-17 DIAGNOSIS — M519 Unspecified thoracic, thoracolumbar and lumbosacral intervertebral disc disorder: Secondary | ICD-10-CM | POA: Diagnosis not present

## 2020-10-17 DIAGNOSIS — R531 Weakness: Secondary | ICD-10-CM

## 2020-10-17 NOTE — Progress Notes (Signed)
Location:  [facility] of Service:  Clinic (12)  Provider: Breydon Senters L. Mariea Clonts, D.O., C.M.D.  Code Status: DNR Goals of Care:  Advanced Directives 10/17/2020  Does Patient Have a Medical Advance Directive? Yes  Type of Advance Directive Out of facility DNR (pink MOST or yellow form)  Does patient want to make changes to medical advance directive? No - Patient declined  Copy of [place] in Chart? -  Would patient like information on creating a medical advance directive? -  Pre-existing out of facility DNR order (yellow form or pink MOST form) Pink MOST/Yellow Form most recent copy in chart - Physician notified to receive inpatient order     Chief Complaint  Patient presents with  . Acute Visit    Complains of cyst like lesion with blackhead on Right upper leg near groin.     HPI: Patient is a 87 y.o. male seen today for an acute visit for large, hard cyst with blackhead on right upper thigh about which nursing was concerned.  Pt himself reports it has been present for years.  It gives him no pain.  There's been no erythema, warmth or drainage from the area.  He would prefer we leave it alone.    He is feeling fine though none of his parts are what they used to be.  He walks slower than before with his rollator walker and his kyphotic posture has progressed which does cause some back pain at times.    Past Medical History:  Diagnosis Date  . Acute kidney injury ([place]) 03/06/14  . Anemia, unspecified 03/06/14  . Arthritis   . BPH (benign prostatic hyperplasia)   . Cerebral atrophy ([place]) 03/06/14  . Cerebrovascular disease 03/06/14  . Degenerative disc disease, lumbar 03/06/14  . Diverticula, bladder acquired   . Edema 03/06/14  . GERD (gastroesophageal reflux disease)   . H/O: GI bleed 1986  . Hiatal hernia   . History of fall 03/06/14  . Hypercalcemia 03/06/14  . Hyperlipidemia   . Hypertension   . Impotence   . LFT elevation 03/06/14  . Lumbago 03/06/14   . Mallory-Weiss tear 1986  . Osteoporosis   . Rhabdomyolysis 03/06/14  . Scoliosis of cervical spine 03/06/14  . Scoliosis of lumbar spine 03/06/14  . Shingles   . Spermatocele    bilateral, left greater than right  . Troponin level elevated 03/06/14  . Weak 03/06/14    Past Surgical History:  Procedure Laterality Date  . BACK SURGERY  10/2009   ESI for surgery for lumbar spinal stenosis  . clubbed toe repair  2004  . COLONOSCOPY  08/15/2011  . INGUINAL HERNIA REPAIR Right 2006  . REPLACEMENT TOTAL KNEE BILATERAL  2007, 2008  . TONSILLECTOMY AND ADENOIDECTOMY  1939  . X-STOP IMPLANTATION      Allergies  Allergen Reactions  . Amrix [Cyclobenzaprine]   . Shellfish Allergy     Outpatient Encounter Medications as of 10/17/2020  Medication Sig  . acetaminophen (TYLENOL) 325 MG tablet Take 650 mg by mouth 2 (two) times daily.   Marland Kitchen acetaminophen (TYLENOL) 325 MG tablet Take 650 mg by mouth 2 (two) times daily as needed.  Marland Kitchen amLODipine (NORVASC) 2.5 MG tablet Take 1 tablet by mouth daily.  Marland Kitchen antiseptic oral rinse (BIOTENE) LIQD 15 mLs by Mouth Rinse route 4 (four) times daily as needed for dry mouth.  . bisacodyl (DULCOLAX) 10 MG suppository Place 10 mg rectally as needed for moderate constipation.  Marland Kitchen  lactose free nutrition (BOOST) LIQD Take 237 mLs by mouth 2 (two) times daily between meals.  Marland Kitchen levothyroxine (SYNTHROID) 25 MCG tablet Take 1 tablet (25 mcg total) by mouth daily before breakfast.  . loperamide (IMODIUM) 2 MG capsule Take 2 mg by mouth 3 (three) times daily as needed for diarrhea or loose stools.  . Melatonin 5 MG CAPS Take 1 capsule (5 mg total) by mouth at bedtime.  . ondansetron (ZOFRAN) 4 MG tablet Take 4 mg by mouth every 6 (six) hours as needed for nausea or vomiting.  . pantoprazole (PROTONIX) 40 MG tablet Take 1 tablet (40 mg total) by mouth daily before supper.  Vladimir Faster Glycol-Propyl Glycol (SYSTANE) 0.4-0.3 % SOLN Two drops both eyes three times daily as  needed for dry itchy eyes.  Marland Kitchen triamcinolone cream (KENALOG) 0.1 % Apply 1 application topically as needed.  . vitamin B-12 (CYANOCOBALAMIN) 1000 MCG tablet Take 500 mcg by mouth daily.   . QUEtiapine (SEROQUEL) 25 MG tablet Take 25 mg by mouth 2 (two) times daily.   . [DISCONTINUED] famotidine (PEPCID) 20 MG tablet Take 1 tablet (20 mg total) by mouth daily as needed for up to 14 days for heartburn or indigestion.   No facility-administered encounter medications on file as of 10/17/2020.    Review of Systems:  Review of Systems  Constitutional: Negative for chills and fever.  Respiratory: Negative for shortness of breath.   Musculoskeletal: Positive for back pain. Negative for falls.  Skin:       Cyst right upper thigh  Psychiatric/Behavioral: Positive for memory loss.    Health Maintenance  Topic Date Due  . PNA vac Low Risk Adult (1 of 2 - PCV13) Never done  . COVID-19 Vaccine (3 - Booster) 03/26/2020  . DEXA SCAN  02/28/2021 (Originally 12/30/2014)  . TETANUS/TDAP  11/12/2027  . HPV VACCINES  Aged Out  . INFLUENZA VACCINE  Discontinued    Physical Exam: Vitals:   10/17/20 1007  BP: 124/76  Pulse: 70  Temp: 97.9 F (36.6 C)  TempSrc: Skin  SpO2: 98%  Weight: 152 lb 9.6 oz (69.2 kg)  Height: 5\' 8"  (1.727 m)   Body mass index is 23.2 kg/m. Physical Exam Vitals reviewed.  Constitutional:      General: He is not in acute distress.    Appearance: He is not toxic-appearing.  Eyes:     Conjunctiva/sclera: Conjunctivae normal.     Pupils: Pupils are equal, round, and reactive to light.  Cardiovascular:     Rate and Rhythm: Normal rate and regular rhythm.  Pulmonary:     Effort: Pulmonary effort is normal.     Breath sounds: Normal breath sounds.  Musculoskeletal:     Right lower leg: No edema.     Left lower leg: No edema.  Skin:    Comments: Firm irregular cyst with black comedone present; total size about 1.5cm, no surrounding erythema, warmth, no drainage, no  tenderness  Neurological:     General: No focal deficit present.     Mental Status: He is alert. Mental status is at baseline.     Gait: Gait abnormal.     Comments: Ambulates with rollator walker and kyphoscoliotic posture  Psychiatric:        Mood and Affect: Mood normal.        Behavior: Behavior normal.     Labs reviewed: Basic Metabolic Panel: Recent Labs    03/01/20 0000  NA 141  K 4.7  CL 106  CO2 24*  BUN 27*  CREATININE 1.7*  CALCIUM 8.3*  TSH 7.28*   Liver Function Tests: Recent Labs    03/01/20 0000  AST 14  ALT 13  ALKPHOS 66  ALBUMIN 3.4*   No results for input(s): LIPASE, AMYLASE in the last 8760 hours. No results for input(s): AMMONIA in the last 8760 hours. CBC: Recent Labs    03/01/20 0000  WBC 6.0  HGB 11.0*  HCT 33*  PLT 257   Lipid Panel: No results for input(s): CHOL, HDL, LDLCALC, TRIG, CHOLHDL, LDLDIRECT in the last 8760 hours. Lab Results  Component Value Date   HGBA1C 5.8 03/01/2020    Assessment/Plan 1. Open comedone -resident and I agreed that since he has no pain and there are no signs of infection of the cyst, there was no need to interfere (reports he's had for years) -should he develop erythema, warmth, drainage, or tenderness, warm compresses should be implemented and oral antibiotics with plans for I/D, but he has none of this now and prefers it be left alone  2. Lumbar disc disease -cont tylenol; has not recently needed tramadol for pain  3. Generalized weakness -cont use of rollator walker for support as balance worsening  Labs/tests ordered:  No new Next appt:  F/u in 4 mos   Aubreyana Saltz L. Laquan Beier, D.O. [facility] [address], [place] [address] Cell Phone (Mon-Fri 8am-5pm):  [phone] On Call:  [phone] & follow prompts after 5pm & weekends Office Phone:  [phone] Office Fax:  [phone]

## 2020-10-25 ENCOUNTER — Telehealth: Payer: Self-pay | Admitting: Nurse Practitioner

## 2020-10-25 NOTE — Telephone Encounter (Signed)
Called to schedule AWV. Left vm.

## 2020-11-23 ENCOUNTER — Encounter (HOSPITAL_COMMUNITY): Payer: Self-pay | Admitting: Emergency Medicine

## 2020-11-23 ENCOUNTER — Other Ambulatory Visit: Payer: Self-pay

## 2020-11-23 ENCOUNTER — Inpatient Hospital Stay (HOSPITAL_COMMUNITY)
Admission: EM | Admit: 2020-11-23 | Discharge: 2020-11-30 | DRG: 326 | Disposition: A | Discharge status: Skilled Nursing Facility | Payer: Medicare Other | Source: Skilled Nursing Facility | Attending: Internal Medicine | Admitting: Internal Medicine

## 2020-11-23 DIAGNOSIS — J9811 Atelectasis: Secondary | ICD-10-CM | POA: Diagnosis not present

## 2020-11-23 DIAGNOSIS — Z96653 Presence of artificial knee joint, bilateral: Secondary | ICD-10-CM | POA: Diagnosis present

## 2020-11-23 DIAGNOSIS — Z79890 Hormone replacement therapy (postmenopausal): Secondary | ICD-10-CM | POA: Diagnosis not present

## 2020-11-23 DIAGNOSIS — J90 Pleural effusion, not elsewhere classified: Secondary | ICD-10-CM | POA: Diagnosis not present

## 2020-11-23 DIAGNOSIS — M4155 Other secondary scoliosis, thoracolumbar region: Secondary | ICD-10-CM | POA: Diagnosis present

## 2020-11-23 DIAGNOSIS — I129 Hypertensive chronic kidney disease with stage 1 through stage 4 chronic kidney disease, or unspecified chronic kidney disease: Secondary | ICD-10-CM | POA: Diagnosis present

## 2020-11-23 DIAGNOSIS — K922 Gastrointestinal hemorrhage, unspecified: Secondary | ICD-10-CM | POA: Diagnosis not present

## 2020-11-23 DIAGNOSIS — Z888 Allergy status to other drugs, medicaments and biological substances status: Secondary | ICD-10-CM

## 2020-11-23 DIAGNOSIS — Z87891 Personal history of nicotine dependence: Secondary | ICD-10-CM | POA: Diagnosis not present

## 2020-11-23 DIAGNOSIS — E872 Acidosis: Secondary | ICD-10-CM | POA: Diagnosis not present

## 2020-11-23 DIAGNOSIS — Z79899 Other long term (current) drug therapy: Secondary | ICD-10-CM | POA: Diagnosis not present

## 2020-11-23 DIAGNOSIS — Z91013 Allergy to seafood: Secondary | ICD-10-CM

## 2020-11-23 DIAGNOSIS — Z515 Encounter for palliative care: Secondary | ICD-10-CM | POA: Diagnosis not present

## 2020-11-23 DIAGNOSIS — R4189 Other symptoms and signs involving cognitive functions and awareness: Secondary | ICD-10-CM | POA: Diagnosis not present

## 2020-11-23 DIAGNOSIS — N183 Chronic kidney disease, stage 3 unspecified: Secondary | ICD-10-CM | POA: Diagnosis not present

## 2020-11-23 DIAGNOSIS — K2970 Gastritis, unspecified, without bleeding: Secondary | ICD-10-CM | POA: Diagnosis not present

## 2020-11-23 DIAGNOSIS — K254 Chronic or unspecified gastric ulcer with hemorrhage: Secondary | ICD-10-CM | POA: Diagnosis not present

## 2020-11-23 DIAGNOSIS — E871 Hypo-osmolality and hyponatremia: Secondary | ICD-10-CM | POA: Diagnosis not present

## 2020-11-23 DIAGNOSIS — I10 Essential (primary) hypertension: Secondary | ICD-10-CM

## 2020-11-23 DIAGNOSIS — N1330 Unspecified hydronephrosis: Secondary | ICD-10-CM | POA: Diagnosis present

## 2020-11-23 DIAGNOSIS — Z66 Do not resuscitate: Secondary | ICD-10-CM | POA: Diagnosis present

## 2020-11-23 DIAGNOSIS — H9190 Unspecified hearing loss, unspecified ear: Secondary | ICD-10-CM | POA: Diagnosis present

## 2020-11-23 DIAGNOSIS — K449 Diaphragmatic hernia without obstruction or gangrene: Secondary | ICD-10-CM | POA: Diagnosis not present

## 2020-11-23 DIAGNOSIS — Z20822 Contact with and (suspected) exposure to covid-19: Secondary | ICD-10-CM | POA: Diagnosis not present

## 2020-11-23 DIAGNOSIS — M4185 Other forms of scoliosis, thoracolumbar region: Secondary | ICD-10-CM | POA: Diagnosis present

## 2020-11-23 DIAGNOSIS — E785 Hyperlipidemia, unspecified: Secondary | ICD-10-CM | POA: Diagnosis present

## 2020-11-23 DIAGNOSIS — I679 Cerebrovascular disease, unspecified: Secondary | ICD-10-CM | POA: Diagnosis present

## 2020-11-23 DIAGNOSIS — M810 Age-related osteoporosis without current pathological fracture: Secondary | ICD-10-CM | POA: Diagnosis present

## 2020-11-23 DIAGNOSIS — M419 Scoliosis, unspecified: Secondary | ICD-10-CM | POA: Diagnosis present

## 2020-11-23 DIAGNOSIS — Z811 Family history of alcohol abuse and dependence: Secondary | ICD-10-CM

## 2020-11-23 DIAGNOSIS — R1310 Dysphagia, unspecified: Secondary | ICD-10-CM | POA: Diagnosis present

## 2020-11-23 DIAGNOSIS — Z9989 Dependence on other enabling machines and devices: Secondary | ICD-10-CM | POA: Diagnosis not present

## 2020-11-23 DIAGNOSIS — K440 Diaphragmatic hernia with obstruction, without gangrene: Secondary | ICD-10-CM | POA: Diagnosis present

## 2020-11-23 DIAGNOSIS — K429 Umbilical hernia without obstruction or gangrene: Secondary | ICD-10-CM | POA: Diagnosis present

## 2020-11-23 DIAGNOSIS — E876 Hypokalemia: Secondary | ICD-10-CM | POA: Diagnosis present

## 2020-11-23 DIAGNOSIS — E870 Hyperosmolality and hypernatremia: Secondary | ICD-10-CM | POA: Diagnosis not present

## 2020-11-23 DIAGNOSIS — Z01810 Encounter for preprocedural cardiovascular examination: Secondary | ICD-10-CM | POA: Diagnosis not present

## 2020-11-23 DIAGNOSIS — K562 Volvulus: Secondary | ICD-10-CM | POA: Diagnosis present

## 2020-11-23 DIAGNOSIS — K253 Acute gastric ulcer without hemorrhage or perforation: Secondary | ICD-10-CM | POA: Diagnosis present

## 2020-11-23 DIAGNOSIS — D62 Acute posthemorrhagic anemia: Secondary | ICD-10-CM | POA: Diagnosis not present

## 2020-11-23 DIAGNOSIS — K920 Hematemesis: Secondary | ICD-10-CM | POA: Diagnosis not present

## 2020-11-23 DIAGNOSIS — E039 Hypothyroidism, unspecified: Secondary | ICD-10-CM | POA: Diagnosis not present

## 2020-11-23 DIAGNOSIS — I471 Supraventricular tachycardia: Secondary | ICD-10-CM | POA: Diagnosis not present

## 2020-11-23 DIAGNOSIS — N1832 Chronic kidney disease, stage 3b: Secondary | ICD-10-CM

## 2020-11-23 DIAGNOSIS — R197 Diarrhea, unspecified: Secondary | ICD-10-CM | POA: Diagnosis not present

## 2020-11-23 DIAGNOSIS — I959 Hypotension, unspecified: Secondary | ICD-10-CM | POA: Diagnosis not present

## 2020-11-23 DIAGNOSIS — E440 Moderate protein-calorie malnutrition: Secondary | ICD-10-CM | POA: Diagnosis not present

## 2020-11-23 DIAGNOSIS — R739 Hyperglycemia, unspecified: Secondary | ICD-10-CM | POA: Diagnosis present

## 2020-11-23 DIAGNOSIS — D509 Iron deficiency anemia, unspecified: Secondary | ICD-10-CM | POA: Diagnosis not present

## 2020-11-23 DIAGNOSIS — R54 Age-related physical debility: Secondary | ICD-10-CM | POA: Diagnosis present

## 2020-11-23 DIAGNOSIS — K439 Ventral hernia without obstruction or gangrene: Secondary | ICD-10-CM | POA: Diagnosis present

## 2020-11-23 DIAGNOSIS — Z7189 Other specified counseling: Secondary | ICD-10-CM | POA: Diagnosis not present

## 2020-11-23 DIAGNOSIS — I340 Nonrheumatic mitral (valve) insufficiency: Secondary | ICD-10-CM | POA: Diagnosis not present

## 2020-11-23 DIAGNOSIS — I491 Atrial premature depolarization: Secondary | ICD-10-CM | POA: Diagnosis not present

## 2020-11-23 DIAGNOSIS — M2550 Pain in unspecified joint: Secondary | ICD-10-CM | POA: Diagnosis not present

## 2020-11-23 DIAGNOSIS — D500 Iron deficiency anemia secondary to blood loss (chronic): Secondary | ICD-10-CM | POA: Diagnosis not present

## 2020-11-23 DIAGNOSIS — N400 Enlarged prostate without lower urinary tract symptoms: Secondary | ICD-10-CM | POA: Diagnosis present

## 2020-11-23 DIAGNOSIS — R58 Hemorrhage, not elsewhere classified: Secondary | ICD-10-CM | POA: Diagnosis not present

## 2020-11-23 DIAGNOSIS — R1909 Other intra-abdominal and pelvic swelling, mass and lump: Secondary | ICD-10-CM | POA: Diagnosis present

## 2020-11-23 DIAGNOSIS — R112 Nausea with vomiting, unspecified: Secondary | ICD-10-CM | POA: Diagnosis not present

## 2020-11-23 DIAGNOSIS — K3189 Other diseases of stomach and duodenum: Secondary | ICD-10-CM | POA: Diagnosis not present

## 2020-11-23 DIAGNOSIS — K219 Gastro-esophageal reflux disease without esophagitis: Secondary | ICD-10-CM | POA: Diagnosis not present

## 2020-11-23 DIAGNOSIS — Z7401 Bed confinement status: Secondary | ICD-10-CM | POA: Diagnosis not present

## 2020-11-23 DIAGNOSIS — Z01818 Encounter for other preprocedural examination: Secondary | ICD-10-CM | POA: Diagnosis not present

## 2020-11-23 DIAGNOSIS — K31A19 Gastric intestinal metaplasia without dysplasia, unspecified site: Secondary | ICD-10-CM | POA: Diagnosis not present

## 2020-11-23 DIAGNOSIS — L723 Sebaceous cyst: Secondary | ICD-10-CM | POA: Diagnosis present

## 2020-11-23 DIAGNOSIS — K250 Acute gastric ulcer with hemorrhage: Secondary | ICD-10-CM | POA: Diagnosis not present

## 2020-11-23 DIAGNOSIS — R0902 Hypoxemia: Secondary | ICD-10-CM | POA: Diagnosis not present

## 2020-11-23 LAB — CBC WITH DIFFERENTIAL/PLATELET
Abs Immature Granulocytes: 0.05 10*3/uL (ref 0.00–0.07)
Basophils Absolute: 0 10*3/uL (ref 0.0–0.1)
Basophils Relative: 0 %
Eosinophils Absolute: 0 10*3/uL (ref 0.0–0.5)
Eosinophils Relative: 0 %
HCT: 20.9 % — ABNORMAL LOW (ref 39.0–52.0)
Hemoglobin: 6.5 g/dL — CL (ref 13.0–17.0)
Immature Granulocytes: 0 %
Lymphocytes Relative: 13 %
Lymphs Abs: 1.7 10*3/uL (ref 0.7–4.0)
MCH: 28.1 pg (ref 26.0–34.0)
MCHC: 31.1 g/dL (ref 30.0–36.0)
MCV: 90.5 fL (ref 80.0–100.0)
Monocytes Absolute: 1.5 10*3/uL — ABNORMAL HIGH (ref 0.1–1.0)
Monocytes Relative: 11 %
Neutro Abs: 9.6 10*3/uL — ABNORMAL HIGH (ref 1.7–7.7)
Neutrophils Relative %: 76 %
Platelets: 274 10*3/uL (ref 150–400)
RBC: 2.31 MIL/uL — ABNORMAL LOW (ref 4.22–5.81)
RDW: 15.4 % (ref 11.5–15.5)
WBC: 12.8 10*3/uL — ABNORMAL HIGH (ref 4.0–10.5)
nRBC: 0 % (ref 0.0–0.2)

## 2020-11-23 LAB — COMPREHENSIVE METABOLIC PANEL
ALT: 18 U/L (ref 0–44)
AST: 20 U/L (ref 15–41)
Albumin: 2.9 g/dL — ABNORMAL LOW (ref 3.5–5.0)
Alkaline Phosphatase: 58 U/L (ref 38–126)
Anion gap: 10 (ref 5–15)
BUN: 65 mg/dL — ABNORMAL HIGH (ref 8–23)
CO2: 20 mmol/L — ABNORMAL LOW (ref 22–32)
Calcium: 7.8 mg/dL — ABNORMAL LOW (ref 8.9–10.3)
Chloride: 110 mmol/L (ref 98–111)
Creatinine, Ser: 1.62 mg/dL — ABNORMAL HIGH (ref 0.61–1.24)
GFR, Estimated: 39 mL/min — ABNORMAL LOW (ref >60)
Glucose, Bld: 138 mg/dL — ABNORMAL HIGH (ref 70–99)
Potassium: 3.6 mmol/L (ref 3.5–5.1)
Sodium: 140 mmol/L (ref 135–145)
Total Bilirubin: 0.9 mg/dL (ref 0.3–1.2)
Total Protein: 6.1 g/dL — ABNORMAL LOW (ref 6.5–8.1)

## 2020-11-23 LAB — LACTIC ACID, PLASMA
Lactic Acid, Venous: 2.8 mmol/L (ref 0.5–1.9)
Lactic Acid, Venous: 2.2 mmol/L (ref 0.5–1.9)

## 2020-11-23 LAB — LIPASE, BLOOD: Lipase: 28 U/L (ref 11–51)

## 2020-11-23 LAB — PREPARE RBC (CROSSMATCH)

## 2020-11-23 LAB — POC OCCULT BLOOD, ED: Fecal Occult Bld: POSITIVE — AB

## 2020-11-23 MED ORDER — SODIUM CHLORIDE 0.9 % IV SOLN
10.0000 mL/h | Freq: Once | INTRAVENOUS | Status: AC
  Administered 2020-11-23: 10 mL/h via INTRAVENOUS

## 2020-11-23 MED ORDER — SODIUM CHLORIDE 0.9 % IV SOLN
80.0000 mg | Freq: Once | INTRAVENOUS | Status: AC
  Administered 2020-11-23: 20:00:00 80 mg via INTRAVENOUS
  Filled 2020-11-23: qty 80

## 2020-11-23 MED ORDER — SODIUM CHLORIDE 0.9 % IV SOLN
8.0000 mg/h | INTRAVENOUS | Status: DC
  Administered 2020-11-23: 21:00:00 8 mg/h via INTRAVENOUS
  Filled 2020-11-23 (×2): qty 80

## 2020-11-23 MED ORDER — PANTOPRAZOLE SODIUM 40 MG IV SOLR: 40.0000 mg | Freq: Two times a day (BID) | INTRAVENOUS | Status: DC

## 2020-11-23 NOTE — ED Provider Notes (Signed)
[facility] DEPT Provider Note   CSN: [id number] Arrival date & time: 11/23/20  1803     History Chief Complaint  Patient presents with  . Rectal Bleeding  . Weakness  . Emesis    [name] is a 87 y.o. male.  HPI Elderly male presents from nursing facility with staff concern of nausea, vomiting, diarrhea.  Currently patient denies complaints including pain, nausea, does describe some weakness, worse with ambulation.  He acknowledges some memory difficulty.  Per report the patient had at least one episode of dark emesis, as well as several episodes of diarrhea earlier in the day.  No report of fall, loss of consciousness, fever.  No known precipitant, no clear alleviating or exacerbating factors.    Past Medical History:  Diagnosis Date  . Acute kidney injury ([place]) 03/06/14  . Anemia, unspecified 03/06/14  . Arthritis   . BPH (benign prostatic hyperplasia)   . Cerebral atrophy ([place]) 03/06/14  . Cerebrovascular disease 03/06/14  . Degenerative disc disease, lumbar 03/06/14  . Diverticula, bladder acquired   . Edema 03/06/14  . GERD (gastroesophageal reflux disease)   . H/O: GI bleed 1986  . Hiatal hernia   . History of fall 03/06/14  . Hypercalcemia 03/06/14  . Hyperlipidemia   . Hypertension   . Impotence   . LFT elevation 03/06/14  . Lumbago 03/06/14  . Mallory-Weiss tear 1986  . Osteoporosis   . Rhabdomyolysis 03/06/14  . Scoliosis of cervical spine 03/06/14  . Scoliosis of lumbar spine 03/06/14  . Shingles   . Spermatocele    bilateral, left greater than right  . Troponin level elevated 03/06/14  . Weak 03/06/14    Patient Active Problem List   Diagnosis Date Noted  . Hernia of anterior abdominal wall 06/20/2019  . CKD (chronic kidney disease), stage III ([place]) 02/03/2019  . H/O sinus bradycardia 11/20/2017  . Senile osteoporosis 11/20/2017  . Other idiopathic scoliosis, thoracolumbar region 11/20/2017  . Continuous leakage of urine  11/20/2017  . Malnutrition of moderate degree ([place]) 02/28/2014  . Chronic low back pain 02/26/2014  . Elevated LFTs 02/26/2014  . Alcohol use 02/26/2014  . Generalized weakness 02/26/2014  . GERD (gastroesophageal reflux disease) 08/13/2011  . Lumbar disc disease 08/13/2011    Past Surgical History:  Procedure Laterality Date  . BACK SURGERY  10/2009   ESI for surgery for lumbar spinal stenosis  . clubbed toe repair  2004  . COLONOSCOPY  08/15/2011  . INGUINAL HERNIA REPAIR Right 2006  . REPLACEMENT TOTAL KNEE BILATERAL  2007, 2008  . TONSILLECTOMY AND ADENOIDECTOMY  1939  . X-STOP IMPLANTATION         Family History  Problem Relation Age of Onset  . Alcoholism Father   . Liver disease Father   . Colon cancer Neg Hx     Social History   Tobacco Use  . Smoking status: Former Smoker    Types: Cigarettes  . Smokeless tobacco: Never Used  . Tobacco comment: quit in 1960s  Substance Use Topics  . Alcohol use: Yes    Alcohol/week: 14.0 standard drinks    Types: 14 Standard drinks or equivalent per week    Comment: couple of drinks of scotch every other day  . Drug use: No    Home Medications Prior to Admission medications   Medication Sig Start Date End Date Taking? Authorizing Provider  acetaminophen (TYLENOL) 325 MG tablet Take 650 mg by mouth 2 (two) times daily.  [provider]  acetaminophen (TYLENOL) 325 MG tablet Take 650 mg by mouth 2 (two) times daily as needed.    [provider]  amLODipine (NORVASC) 2.5 MG tablet Take 1 tablet by mouth daily. 02/03/19   [provider]  antiseptic oral rinse (BIOTENE) LIQD 15 mLs by Mouth Rinse route 4 (four) times daily as needed for dry mouth.    [provider]  bisacodyl (DULCOLAX) 10 MG suppository Place 10 mg rectally as needed for moderate constipation.    [provider]  lactose free nutrition (BOOST) LIQD Take 237 mLs by mouth 2 (two) times daily between meals.     [provider]  levothyroxine (SYNTHROID) 25 MCG tablet Take 1 tablet (25 mcg total) by mouth daily before breakfast. 04/17/20   Reed, Tiffany L, DO  loperamide (IMODIUM) 2 MG capsule Take 2 mg by mouth 3 (three) times daily as needed for diarrhea or loose stools.    [provider]  Melatonin 5 MG CAPS Take 1 capsule (5 mg total) by mouth at bedtime. 07/04/20   Reed, Tiffany L, DO  ondansetron (ZOFRAN) 4 MG tablet Take 4 mg by mouth every 6 (six) hours as needed for nausea or vomiting.    [provider]  pantoprazole (PROTONIX) 40 MG tablet Take 1 tablet (40 mg total) by mouth daily before supper. 07/04/20   Reed, Tiffany L, DO  Polyethyl Glycol-Propyl Glycol (SYSTANE) 0.4-0.3 % SOLN Two drops both eyes three times daily as needed for dry itchy eyes.    [provider]  QUEtiapine (SEROQUEL) 25 MG tablet Take 25 mg by mouth 2 (two) times daily.  02/09/19 02/10/20  [provider]  triamcinolone cream (KENALOG) 0.1 % Apply 1 application topically as needed. 02/25/19   [provider]  vitamin B-12 (CYANOCOBALAMIN) 1000 MCG tablet Take 500 mcg by mouth daily.     [provider]    Allergies    Amrix [cyclobenzaprine] and Shellfish allergy  Review of Systems   Review of Systems  Constitutional:       Per HPI, otherwise negative  HENT:       Per HPI, otherwise negative  Respiratory:       Per HPI, otherwise negative  Cardiovascular:       Per HPI, otherwise negative  Gastrointestinal: Positive for diarrhea, nausea and vomiting. Negative for abdominal pain.  Endocrine:       Negative aside from HPI  Genitourinary:       Neg aside from HPI   Musculoskeletal:       Per HPI, otherwise negative  Skin: Negative.   Neurological: Negative for syncope.    Physical Exam Updated Vital Signs BP (!) 149/107   Pulse 76   Temp 98.1 F (36.7 C) (Oral)   Resp 19   SpO2 93%   Physical Exam Vitals and nursing note reviewed.   Constitutional:      General: He is not in acute distress.    Appearance: He is well-developed.  HENT:     Head: Normocephalic and atraumatic.  Eyes:     Conjunctiva/sclera: Conjunctivae normal.  Cardiovascular:     Rate and Rhythm: Normal rate and regular rhythm.  Pulmonary:     Effort: Pulmonary effort is normal. No respiratory distress.     Breath sounds: No stridor.  Abdominal:     General: There is no distension.     Tenderness: There is no abdominal tenderness. There is no guarding.  Skin:  General: Skin is warm and dry.  Neurological:     Mental Status: He is alert and oriented to person, place, and time.     ED Results / Procedures / Treatments   Labs (all labs ordered are listed, but only abnormal results are displayed) Labs Reviewed  COMPREHENSIVE METABOLIC PANEL - Abnormal; Notable for the following components:      Result Value   CO2 20 (*)    Glucose, Bld 138 (*)    BUN 65 (*)    Creatinine, Ser 1.62 (*)    Calcium 7.8 (*)    Total Protein 6.1 (*)    Albumin 2.9 (*)    GFR, Estimated 39 (*)    All other components within normal limits  LACTIC ACID, PLASMA - Abnormal; Notable for the following components:   Lactic Acid, Venous 2.8 (*)    All other components within normal limits  CBC WITH DIFFERENTIAL/PLATELET - Abnormal; Notable for the following components:   WBC 12.8 (*)    RBC 2.31 (*)    Hemoglobin 6.5 (*)    HCT 20.9 (*)    Neutro Abs 9.6 (*)    Monocytes Absolute 1.5 (*)    All other components within normal limits  POC OCCULT BLOOD, ED - Abnormal; Notable for the following components:   Fecal Occult Bld POSITIVE (*)    All other components within normal limits  SARS CORONAVIRUS 2 (TAT 6-24 HRS)  LIPASE, BLOOD  LACTIC ACID, PLASMA  URINALYSIS, ROUTINE W REFLEX MICROSCOPIC  PREPARE RBC (CROSSMATCH)     Medications Ordered in ED Medications  pantoprazole (PROTONIX) 80 mg in sodium chloride 0.9 % 100 mL (0.8 mg/mL) infusion (8 mg/hr  Intravenous New Bag/Given 11/23/20 2056)  pantoprazole (PROTONIX) injection 40 mg (has no administration in time range)  0.9 %  sodium chloride infusion (has no administration in time range)  pantoprazole (PROTONIX) 80 mg in sodium chloride 0.9 % 100 mL IVPB (0 mg Intravenous Stopped 11/23/20 2053)    ED Course  I have reviewed the triage vital signs and the nursing notes.  Pertinent labs & imaging results that were available during my care of the patient were reviewed by me and considered in my medical decision making (see chart for details).   Patient in no distress, no hypotension, vital signs remain largely unchanged. Labs notable for hemoglobin 6.5, mild lactic acidosis.  No additional vomiting, no loose bowel movements since arrival.  After arrival the patient was started on pantoprazole drip, fluids.  To:, Hemoccult positive result.  Patient will receive transfusion, remains in no distress.  I have messaged with our gastroenterology colleague on-call, Dr. Therisa Doyne.  Patient will have morning consult.  MDM Rules/Calculators/A&P Adult male with mild memory loss presents from nursing facility with staff concerns of diarrhea, possible hematemesis.  Here the patient essentially denies complaints, is hemodynamically unremarkable, with does have some difficulty with recalling his history. Given the described episodes of vomiting and diarrhea, consideration of GI bleed, labs were performed.  There was notable for critically abnormal hemoglobin value.  Patient's abdominal exam was soft, nonperitoneal, he continued to deny any complaints throughout his ED. He did have one brief course of tachycardia, but this self resolved. After he received pantoprazole arrival, he was scheduled to receive 2 units blood transfusion due to his critically abnormal value, Hemoccult positive status.  He will be admitted to the internal medicine team for further monitoring, management. Final Clinical Impression(s) / ED  Diagnoses Final diagnoses:  Acute GI  bleeding   CRITICAL CARE Performed by: Carmin Muskrat Total critical care time: 35 minutes Critical care time was exclusive of separately billable procedures and treating other patients. Critical care was necessary to treat or prevent imminent or life-threatening deterioration. Critical care was time spent personally by me on the following activities: development of treatment plan with patient and/or surrogate as well as nursing, discussions with consultants, evaluation of patient's response to treatment, examination of patient, obtaining history from patient or surrogate, ordering and performing treatments and interventions, ordering and review of laboratory studies, ordering and review of radiographic studies, pulse oximetry and re-evaluation of patient's condition.    Carmin Muskrat, MD 11/23/20 2139

## 2020-11-23 NOTE — ED Triage Notes (Signed)
Pt comes to ed, via ems from [facility]/ chief complaint of bloody stools with NVD.  Vomited today coffee ground emesis. He complains of general weakness. Pt is ambulatory at baseline when not sick, alert x 3 PMH includes Anemia,GERD, and HTN. No blood thinners, no loc, trauma noted in subjective history from EMS.   Pt is DNR. IV in LFA 20, 500 ns going.

## 2020-11-24 ENCOUNTER — Encounter (HOSPITAL_COMMUNITY): Payer: Self-pay | Admitting: Family Medicine

## 2020-11-24 ENCOUNTER — Inpatient Hospital Stay (HOSPITAL_COMMUNITY): Payer: Medicare Other | Admitting: Anesthesiology

## 2020-11-24 ENCOUNTER — Encounter (HOSPITAL_COMMUNITY)
Admission: EM | Disposition: A | Discharge status: Skilled Nursing Facility | Payer: Self-pay | Source: Skilled Nursing Facility | Attending: Internal Medicine

## 2020-11-24 ENCOUNTER — Inpatient Hospital Stay (HOSPITAL_COMMUNITY): Payer: Medicare Other

## 2020-11-24 ENCOUNTER — Other Ambulatory Visit: Payer: Self-pay

## 2020-11-24 DIAGNOSIS — I10 Essential (primary) hypertension: Secondary | ICD-10-CM | POA: Diagnosis present

## 2020-11-24 DIAGNOSIS — R4189 Other symptoms and signs involving cognitive functions and awareness: Secondary | ICD-10-CM | POA: Diagnosis present

## 2020-11-24 DIAGNOSIS — E039 Hypothyroidism, unspecified: Secondary | ICD-10-CM | POA: Diagnosis present

## 2020-11-24 HISTORY — PX: BIOPSY: SHX5522

## 2020-11-24 HISTORY — PX: ESOPHAGOGASTRODUODENOSCOPY (EGD) WITH PROPOFOL: SHX5813

## 2020-11-24 LAB — SARS CORONAVIRUS 2 BY RT PCR (HOSPITAL ORDER, PERFORMED IN ~~LOC~~ HOSPITAL LAB): SARS Coronavirus 2: NEGATIVE

## 2020-11-24 LAB — LACTIC ACID, PLASMA
Lactic Acid, Venous: 1.8 mmol/L (ref 0.5–1.9)
Lactic Acid, Venous: 1 mmol/L (ref 0.5–1.9)

## 2020-11-24 LAB — CBC
HCT: 25.9 % — ABNORMAL LOW (ref 39.0–52.0)
Hemoglobin: 8.5 g/dL — ABNORMAL LOW (ref 13.0–17.0)
MCH: 29.3 pg (ref 26.0–34.0)
MCHC: 32.8 g/dL (ref 30.0–36.0)
MCV: 89.3 fL (ref 80.0–100.0)
Platelets: 231 10*3/uL (ref 150–400)
RBC: 2.9 MIL/uL — ABNORMAL LOW (ref 4.22–5.81)
RDW: 15.3 % (ref 11.5–15.5)
WBC: 11.9 10*3/uL — ABNORMAL HIGH (ref 4.0–10.5)
nRBC: 0 % (ref 0.0–0.2)

## 2020-11-24 LAB — MAGNESIUM: Magnesium: 1.9 mg/dL (ref 1.7–2.4)

## 2020-11-24 LAB — BASIC METABOLIC PANEL
Anion gap: 6 (ref 5–15)
BUN: 61 mg/dL — ABNORMAL HIGH (ref 8–23)
CO2: 22 mmol/L (ref 22–32)
Calcium: 8 mg/dL — ABNORMAL LOW (ref 8.9–10.3)
Chloride: 118 mmol/L — ABNORMAL HIGH (ref 98–111)
Creatinine, Ser: 1.53 mg/dL — ABNORMAL HIGH (ref 0.61–1.24)
GFR, Estimated: 42 mL/min — ABNORMAL LOW (ref >60)
Glucose, Bld: 100 mg/dL — ABNORMAL HIGH (ref 70–99)
Potassium: 3.1 mmol/L — ABNORMAL LOW (ref 3.5–5.1)
Sodium: 146 mmol/L — ABNORMAL HIGH (ref 135–145)

## 2020-11-24 LAB — URINALYSIS, ROUTINE W REFLEX MICROSCOPIC
Bilirubin Urine: NEGATIVE
Glucose, UA: NEGATIVE mg/dL
Hgb urine dipstick: NEGATIVE
Ketones, ur: NEGATIVE mg/dL
Leukocytes,Ua: NEGATIVE
Nitrite: NEGATIVE
Protein, ur: NEGATIVE mg/dL
Specific Gravity, Urine: 1.016 (ref 1.005–1.030)
pH: 6 (ref 5.0–8.0)

## 2020-11-24 LAB — PHOSPHORUS: Phosphorus: 2.4 mg/dL — ABNORMAL LOW (ref 2.5–4.6)

## 2020-11-24 SURGERY — ESOPHAGOGASTRODUODENOSCOPY (EGD) WITH PROPOFOL
Anesthesia: Monitor Anesthesia Care

## 2020-11-24 MED ORDER — IOHEXOL 12 MG/ML PO SOLN
500.0000 mL | ORAL | Status: AC
  Administered 2020-11-24: 500 mL via ORAL

## 2020-11-24 MED ORDER — PROPOFOL 500 MG/50ML IV EMUL
INTRAVENOUS | Status: DC | PRN
  Administered 2020-11-24: 200 ug/kg/min via INTRAVENOUS

## 2020-11-24 MED ORDER — LACTATED RINGERS IV SOLN: INTRAVENOUS | Status: DC | PRN

## 2020-11-24 MED ORDER — IOHEXOL 300 MG/ML  SOLN
80.0000 mL | Freq: Once | INTRAMUSCULAR | Status: AC | PRN
  Administered 2020-11-24: 80 mL via INTRAVENOUS

## 2020-11-24 MED ORDER — POTASSIUM CL IN DEXTROSE 5% 20 MEQ/L IV SOLN
20.0000 meq | INTRAVENOUS | Status: DC
  Administered 2020-11-24 – 2020-11-25 (×2): 20 meq via INTRAVENOUS
  Filled 2020-11-24 (×4): qty 1000

## 2020-11-24 MED ORDER — LEVOTHYROXINE SODIUM 25 MCG PO TABS
25.0000 ug | ORAL_TABLET | Freq: Every day | ORAL | Status: DC
  Administered 2020-11-24 – 2020-11-30 (×6): 25 ug via ORAL
  Filled 2020-11-24 (×8): qty 1

## 2020-11-24 MED ORDER — POTASSIUM CHLORIDE 10 MEQ/100ML IV SOLN
10.0000 meq | INTRAVENOUS | Status: AC
  Administered 2020-11-24 (×4): 10 meq via INTRAVENOUS
  Filled 2020-11-24 (×3): qty 100

## 2020-11-24 MED ORDER — PHENYLEPHRINE HCL (PRESSORS) 10 MG/ML IV SOLN
INTRAVENOUS | Status: DC | PRN
  Administered 2020-11-24 (×2): 120 ug via INTRAVENOUS

## 2020-11-24 MED ORDER — QUETIAPINE FUMARATE 25 MG PO TABS
25.0000 mg | ORAL_TABLET | Freq: Two times a day (BID) | ORAL | Status: DC
  Administered 2020-11-24 – 2020-11-30 (×12): 25 mg via ORAL
  Filled 2020-11-24 (×12): qty 1

## 2020-11-24 MED ORDER — PANTOPRAZOLE SODIUM 40 MG IV SOLR
40.0000 mg | Freq: Two times a day (BID) | INTRAVENOUS | Status: DC
  Administered 2020-11-24 – 2020-11-30 (×12): 40 mg via INTRAVENOUS
  Filled 2020-11-24 (×12): qty 40

## 2020-11-24 MED ORDER — IOHEXOL 9 MG/ML PO SOLN
ORAL | Status: AC
  Filled 2020-11-24: qty 1000

## 2020-11-24 MED ORDER — VITAMIN B-12 1000 MCG PO TABS
500.0000 ug | ORAL_TABLET | Freq: Every day | ORAL | Status: DC
  Administered 2020-11-25 – 2020-11-30 (×6): 500 ug via ORAL
  Filled 2020-11-24 (×6): qty 1

## 2020-11-24 SURGICAL SUPPLY — 15 items

## 2020-11-24 NOTE — Op Note (Signed)
[facility] Patient Name: [name] Procedure Date: 11/24/2020 MRN: [id number] Attending MD: Milus Banister , MD Date of Birth: Jul 15, 1925 CSN: [id number] Age: 87 Admit Type: Inpatient Procedure:                Upper GI endoscopy Indications:              Hematemesis, anemia Hb 6.5 admit Providers:                Milus Banister, MD, Erenest Rasher, RN, William Dalton, Technician Referring MD:              Medicines:                Monitored Anesthesia Care Complications:            No immediate complications. Estimated blood loss:                            None. Estimated Blood Loss:     Estimated blood loss: none. Procedure:                Pre-Anesthesia Assessment:                           - Prior to the procedure, a History and Physical                            was performed, and patient medications and                            allergies were reviewed. The patient's tolerance of                            previous anesthesia was also reviewed. The risks                            and benefits of the procedure and the sedation                            options and risks were discussed with the patient.                            All questions were answered, and informed consent                            was obtained. Prior Anticoagulants: The patient has                            taken no previous anticoagulant or antiplatelet                            agents. ASA Grade Assessment: III - A patient with  severe systemic disease. After reviewing the risks                            and benefits, the patient was deemed in                            satisfactory condition to undergo the procedure.                           After obtaining informed consent, the endoscope was                            passed under direct vision. Throughout the                            procedure, the patient's blood  pressure, pulse, and                            oxygen saturations were monitored continuously. The                            [id number]-[id number] ([id number] Olympus gastroscope was                            introduced through the mouth, and advanced to the                            third part of duodenum. The upper GI endoscopy was                            accomplished without difficulty. The patient                            tolerated the procedure well. Scope In: Scope Out: Findings:      Very abnormal UGI anatomy with apparent large, prolapsing hiatal hernia.       I suspect much of his stomach is above the diaphragm.      Large, deep, non-cirumferential ulcer (3-4cm across) at GE junction vs.       proximal stomach (difficult to tell exact location given the abnormal       anatomy mentioned above. There is dark eschar at the ulcer site. There       is no active bleeding. The surrounding mucosa is edematous, friable,       somewhat neoplastic appearing however it was not firm on forcep biopsies.      The exam was otherwise without abnormality. Impression:               - Very abnormal UGI anatomy with apparent large,                            prolapsing hiatal hernia. I suspect much of his                            stomach is above the diaphragm.                           -  Large, deep, non-cirumferential ulcer (3-4cm                            across) at GE junction vs. proximal stomach                            (difficult to tell exact location given the                            abnormal anatomy mentioned above. There is dark                            eschar at the ulcer site. There is no active                            bleeding. The surrounding mucosa is edematous,                            friable, somewhat neoplastic appearing however it                            was not firm on forcep biopsies. Moderate Sedation:      Not Applicable - Patient had care per  Anesthesia. Recommendation:           - Await pathology results.                           - I am ordering a CT scan chest, abd, pelvis to get                            a better idea of his anatomy.                           - Clear liquid diet for now.                           - IV PPI BID Procedure Code(s):        --- Professional ---                           [id number], Esophagogastroduodenoscopy, flexible,                            transoral; with biopsy, single or multiple Diagnosis Code(s):        --- Professional ---                           K29.70, Gastritis, unspecified, without bleeding                           K92.0, Hematemesis CPT copyright 2019 [employer]. All rights reserved. The codes documented in this report are preliminary and upon coder review may  be revised to meet current compliance requirements. Milus Banister, MD 11/24/2020 12:25:45 PM This report has been signed electronically. Number of Addenda: 0

## 2020-11-24 NOTE — Consult Note (Addendum)
Referring Provider: Dr. Raiford Noble  Primary Care Physician:  Gregory Curry, DO Primary Gastroenterologist:  Previously Dr. Olevia Pope   Reason for Consultation: Anemia, positive FOBT  HPI: [name] is a 87 y.o. male with a past medical history of  hypertension, hyperlipidemia, hypothyroidism, arthritis, hiatal hernia, GERD, Mallory-Weiss tear 1986 and mild memory impairment.   He presented to [facility] ED on 11/23/2020 from SNF with nausea, vomiting and diarrhea x 2 days.  The SNF nursing staff reported the patient had at least one episode of dark emesis with multiple episodes of diarrhea.  Labs in the ED showed a Hemoglobin level of 6.5 ( baseline Hg 11.0 on 03/01/2020).  Hematocrit 20.9.  BUN 65 (baseline BUN 27). Cr 1.62 ( baseline Cr 1.70).  FOBT positive.  Lipase 28.  Normal LFTs.  WBC 12.8.  He is afebrile.  SARS coronavirus 2 negative.  Negative urinalysis.  Pantoprazole infusion was initiated.  2 units of packed red blood cells were transfused, the second transfusion completed at 8:45 AM.   [name] is awake and alert.  He denies having any heartburn or dysphagia.  No upper or lower abdominal pain.  He stated having nausea with vomiting for the past few days.  He stated his vomit looks like dark chocolate.  No further vomiting since arriving to the hospital.  No diarrhea this a.m.  He denies any obvious bright red rectal bleeding.  No chest pain, palpitations or shortness of breath.  He takes Pantoprazole 40 mg once daily.  No NSAID use.  Not on any anticoagulants.  He is unable to provide details regarding a history of Mallory-Weiss tear  1986 as documented in his epic records.  No family at the bedside to further review his past medical history.  I attempted to call his daughter [name] she did not answer her mobile phone.  Colonoscopy in 2002 and in 2012 were normal.  Past Medical History:  Diagnosis Date  . Acute kidney injury ([place]) 03/06/14  . Anemia,  unspecified 03/06/14  . Arthritis   . BPH (benign prostatic hyperplasia)   . Cerebral atrophy ([place]) 03/06/14  . Cerebrovascular disease 03/06/14  . Degenerative disc disease, lumbar 03/06/14  . Diverticula, bladder acquired   . Edema 03/06/14  . GERD (gastroesophageal reflux disease)   . H/O: GI bleed 1986  . Hiatal hernia   . History of fall 03/06/14  . Hypercalcemia 03/06/14  . Hyperlipidemia   . Hypertension   . Impotence   . LFT elevation 03/06/14  . Lumbago 03/06/14  . Mallory-Weiss tear 1986  . Osteoporosis   . Rhabdomyolysis 03/06/14  . Scoliosis of cervical spine 03/06/14  . Scoliosis of lumbar spine 03/06/14  . Shingles   . Spermatocele    bilateral, left greater than right  . Troponin level elevated 03/06/14  . Weak 03/06/14    Past Surgical History:  Procedure Laterality Date  . BACK SURGERY  10/2009   ESI for surgery for lumbar spinal stenosis  . clubbed toe repair  2004  . COLONOSCOPY  08/15/2011  . INGUINAL HERNIA REPAIR Right 2006  . REPLACEMENT TOTAL KNEE BILATERAL  2007, 2008  . TONSILLECTOMY AND ADENOIDECTOMY  1939  . X-STOP IMPLANTATION      Prior to Admission medications   Medication Sig Start Date End Date Taking? Authorizing Provider  acetaminophen (TYLENOL) 325 MG tablet Take 650 mg by mouth 2 (two) times daily.    Yes [provider]  acetaminophen (  TYLENOL) 325 MG tablet Take 650 mg by mouth 2 (two) times daily as needed.   Yes [provider]  amLODipine (NORVASC) 2.5 MG tablet Take 1 tablet by mouth daily. 02/03/19  Yes [provider]  antiseptic oral rinse (BIOTENE) LIQD 15 mLs by Mouth Rinse route 4 (four) times daily as needed for dry mouth.   Yes [provider]  bisacodyl (DULCOLAX) 10 MG suppository Place 10 mg rectally as needed for moderate constipation.   Yes [provider]  levothyroxine (SYNTHROID) 25 MCG tablet Take 1 tablet (25 mcg total) by mouth daily before breakfast. 04/17/20  Yes Reed, Tiffany  L, DO  Melatonin 5 MG CAPS Take 1 capsule (5 mg total) by mouth at bedtime. 07/04/20  Yes Reed, Tiffany L, DO  ondansetron (ZOFRAN) 4 MG tablet Take 4 mg by mouth every 6 (six) hours as needed for nausea or vomiting.   Yes [provider]  Polyethyl Glycol-Propyl Glycol (SYSTANE) 0.4-0.3 % SOLN Two drops both eyes three times daily as needed for dry itchy eyes.   Yes [provider]  QUEtiapine (SEROQUEL) 25 MG tablet Take 25 mg by mouth 2 (two) times daily.  02/09/19 11/23/20 Yes [provider]  triamcinolone cream (KENALOG) 0.1 % Apply 1 application topically as needed. 02/25/19  Yes [provider]  vitamin B-12 (CYANOCOBALAMIN) 1000 MCG tablet Take 500 mcg by mouth daily.    Yes [provider]  lactose free nutrition (BOOST) LIQD Take 237 mLs by mouth 2 (two) times daily between meals. Patient not taking: Reported on 11/23/2020    [provider]  loperamide (IMODIUM) 2 MG capsule Take 2 mg by mouth 3 (three) times daily as needed for diarrhea or loose stools. Patient not taking: Reported on 11/23/2020    [provider]  pantoprazole (PROTONIX) 40 MG tablet Take 1 tablet (40 mg total) by mouth daily before supper. 07/04/20   Reed, Tiffany L, DO    Current Facility-Administered Medications  Medication Dose Route Frequency Provider Last Rate Last Admin  . levothyroxine (SYNTHROID) tablet 25 mcg  25 mcg Oral Q0600 Tu, Ching T, DO   25 mcg at 11/24/20 0536  . pantoprazole (PROTONIX) 80 mg in sodium chloride 0.9 % 100 mL (0.8 mg/mL) infusion  8 mg/hr Intravenous Continuous Carmin Muskrat, MD 8 mL/hr at 11/24/20 0600 6.4 mg/hr at 11/24/20 0600  . [START ON 11/27/2020] pantoprazole (PROTONIX) injection 40 mg  40 mg Intravenous Q12H Carmin Muskrat, MD      . QUEtiapine (SEROQUEL) tablet 25 mg  25 mg Oral BID Tu, Ching T, DO      . vitamin B-12 (CYANOCOBALAMIN) tablet 500 mcg  500 mcg Oral Daily Tu, Ching T, DO        Allergies as of  11/23/2020 - Review Complete 11/23/2020  Allergen Reaction Noted  . Amrix [cyclobenzaprine]  10/09/2017  . Shellfish allergy  10/09/2017    Family History  Problem Relation Age of Onset  . Alcoholism Father   . Liver disease Father   . Colon cancer Neg Hx     Social History   Socioeconomic History  . Marital status: Divorced    Spouse name: Not on file  . Number of children: 2  . Years of education: Not on file  . Highest education level: Not on file  Occupational History  . Occupation: retired  Tobacco Use  . Smoking status: Former Smoker    Types: Cigarettes  . Smokeless tobacco: Never Used  .  Tobacco comment: quit in 1960s  Substance and Sexual Activity  . Alcohol use: Yes    Alcohol/week: 14.0 standard drinks    Types: 14 Standard drinks or equivalent per week    Comment: couple of drinks of scotch every other day  . Drug use: No  . Sexual activity: Not on file  Other Topics Concern  . Not on file  Social History Narrative   Tobacco use, amount per day now:  NONE   Past tobacco use, amount per day: 1 PACK PER DAY   How many years did you use tobacco: QUIT AGE 46/ 15 YEARS   Alcohol use (drinks per week): 3OZ DAILY/ 2 4OZ GLASSES OF WINE   Diet: REGULAR   Do you drink/eat things with caffeine:   Marital status: DIVORCED              What year were you married?   Do you live in a house, apartment, assisted living, condo, trailer, etc.? [place]    Is it one or more stories?  ONE STORY   How many persons live in your home? 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  WALK                                Type and how often?   Do you have a living will? YES   Do you have a DNR form?     NO                              If not, do you want to discuss one?   Do you have signed POA/HPOA forms?   YES                     If so, please bring to  you appointment   Social Determinants of Health   Financial Resource Strain: Not on file  Food Insecurity: Not on file  Transportation Needs: Not on file  Physical Activity: Not on file  Stress: Not on file  Social Connections: Not on file  Intimate Partner Violence: Not on file    Review of Systems: See HPI, all other systems reviewed and are negative  Physical Exam: Vital signs in last 24 hours: Temp:  [98.1 F (36.7 C)-98.8 F (37.1 C)] 98.2 F (36.8 C) (04/09 0552) Pulse Rate:  [40-181] 83 (04/09 0552) Resp:  [12-22] 18 (04/09 0552) BP: (106-150)/(54-112) 136/60 (04/09 0552) SpO2:  [93 %-100 %] 98 % (04/09 0500) Last BM Date: 11/24/20 General:  Alert fatigued appearing 87 year old male in no acute distress. Head:  Normocephalic and atraumatic. Eyes:  No scleral icterus. Conjunctiva pink. Ears:  Normal auditory acuity. Nose:  No deformity, discharge or lesions. Mouth:  No ulcers or lesions.  Neck:  Supple. No lymphadenopathy or thyromegaly.  Lungs: Breath sounds clear throughout Heart: Regular rate and rhythm, no murmurs. Abdomen: Soft, nontender.  Small golf ball size abdominal hernia to the right upper quadrant was reducible.  Positive bowel sounds to all 4 quadrants.  No HSM. Rectal: No external hemorrhoids.  Thick pasty melenic stool in the rectal vault grossly heme positive. Musculoskeletal:  Symmetrical without gross deformities.  Pulses:  Normal pulses noted. Extremities:  Without clubbing or edema. Neurologic:  Alert and  oriented x 4.  Patient is alert to person, place and time.  Speech is clear.  Moves all extremities weakly. Skin:  Intact without significant lesions or rashes. Psych:  Alert and cooperative. Normal mood and affect.  Intake/Output from previous day: 04/08 0701 - 04/09 0700 In: 90.6 [I.V.:90.6] Out: 200 [Urine:200] Intake/Output this shift: No intake/output data recorded.  Lab Results: Recent Labs    11/23/20 1859  WBC 12.8*  HGB  6.5*  HCT 20.9*  PLT 274   BMET Recent Labs    11/23/20 1859  NA 140  K 3.6  CL 110  CO2 20*  GLUCOSE 138*  BUN 65*  CREATININE 1.62*  CALCIUM 7.8*   LFT Recent Labs    11/23/20 1859  PROT 6.1*  ALBUMIN 2.9*  AST 20  ALT 18  ALKPHOS 58  BILITOT 0.9   PT/INR No results for input(s): LABPROT, INR in the last 72 hours. Hepatitis Panel No results for input(s): HEPBSAG, HCVAB, HEPAIGM, HEPBIGM in the last 72 hours.    Studies/Results: No results found.  IMPRESSION/PLAN:  71.  87 year old male with a remote history of GERD, hiatal hernia, remote Mallory-Weiss tear 1986 admitted to the hospital with N/V,  UGI bleed with hematemesis and anemia. Hg 6.5. Melenic stool on rectal exam. Transfused 2 units of PRBCs. 2nd transfusion completed at 8:45AM. -Post transfusion H/H to be done as a stat lab at 10:45AM -EGD today if post transfusion H/H > 7. EGD benefits and risks discussed including risk with sedation, risk of bleeding, perforation and infection I attempted to contact the patient's daughter [name] to provide an update and to discuss the EGD, I will attempt to call her later today. -Continue Pantoprazole 8mg /hr infusion  -NPO -IVF per the hospitalist  -Zofran 4mg  IV Q 6hrs PRN -Further recommendations per Dr. Edison Nasuti  2. AKI on ? CKD. Cr 1.62.   ADDENDUM: I spoke to the patient's daughter [name] and provide an update and plans for an EGD.  Debbie verified the patient's ex-wife [name] is the patient's medical POA.  Cell phone [phone].  I called [name], she did not answer her cell phone. Msg left, stating I would call her back later today to discuss EGD.    Noralyn Pick  11/24/2020, 8:23 AM   ________________________________________________________________________  Velora Heckler GI MD note:  I personally examined the patient, reviewed the data and agree with the assessment and plan described above. 87 yo man with dark emesis for a week, loose stools,  FOBT +, Hb 6.5 with very elevated BUN. Likely he's had UGI bleeding. I am proceeding with EGD this morning, he received 2 units blood overnight and has been HD stable.   Owens Loffler, MD [facility]astroenterology Pager [phone]

## 2020-11-24 NOTE — Anesthesia Preprocedure Evaluation (Addendum)
Anesthesia Evaluation  Patient identified by MRN, date of birth, ID band Patient awake    Reviewed: Allergy & Precautions, NPO status , Patient's Chart, lab work & pertinent test results  History of Anesthesia Complications Negative for: history of anesthetic complications  Airway Mallampati: I  TM Distance: >3 FB Neck ROM: Full    Dental  (+) Dental Advisory Given, Teeth Intact   Pulmonary neg shortness of breath, neg sleep apnea, neg COPD, neg recent URI, former smoker,  Covid-19 Nucleic Acid Test Results Lab Results      Component                Value               Date                      [place]              NEGATIVE            11/23/2020                [place]              Not Detected        05/28/2019                [place]              Not Detected        05/18/2019              breath sounds clear to auscultation       Cardiovascular hypertension, Pt. on medications (-) angina(-) Past MI and (-) CHF  Rhythm:Regular  Left ventricle: The cavity size was normal. Systolic function was  normal. Wall motion was normal; there were no regional wall  motion abnormalities. Left ventricular diastolic function  parameters were normal.     Neuro/Psych  Neuromuscular disease negative psych ROS   GI/Hepatic hiatal hernia, GERD  Medicated,Lab Results      Component                Value               Date                      ALT                      18                  11/23/2020                AST                      20                  11/23/2020                ALKPHOS                  58                  11/23/2020                BILITOT                  0.9  11/23/2020            ?GI bleed as source of anemia   Endo/Other  Hypothyroidism   Renal/GU ARF and CRFRenal diseaseLab Results      Component                Value               Date                      CREATININE                1.62 (H)            11/23/2020                Musculoskeletal  (+) Arthritis ,   Abdominal   Peds  Hematology  (+) Blood dyscrasia, anemia , Lab Results      Component                Value               Date                      WBC                      12.8 (H)            11/23/2020                HGB                      6.5 (LL)            11/23/2020                HCT                      20.9 (L)            11/23/2020                MCV                      90.5                11/23/2020                PLT                      274                 11/23/2020              Anesthesia Other Findings   Reproductive/Obstetrics                            Anesthesia Physical Anesthesia Plan  ASA: II  Anesthesia Plan: MAC   Post-op Pain Management:    Induction: Intravenous  PONV Risk Score and Plan: 1 and Treatment may vary due to age or medical condition and Propofol infusion  Airway Management Planned: Nasal Cannula  Additional Equipment: None  Intra-op Plan:   Post-operative Plan: Extubation in OR  Informed Consent: I have reviewed the patients History and Physical, chart, labs and discussed the procedure including the risks, benefits and alternatives for the proposed anesthesia with the patient or authorized representative  who has indicated his/her understanding and acceptance.     Dental advisory given  Plan Discussed with: CRNA  Anesthesia Plan Comments:         Anesthesia Quick Evaluation

## 2020-11-24 NOTE — Anesthesia Procedure Notes (Signed)
Procedure Name: MAC Date/Time: 11/24/2020 12:01 PM Performed by: Lissa Morales, CRNA Pre-anesthesia Checklist: Patient identified, Emergency Drugs available, Suction available and Patient being monitored Patient Re-evaluated:Patient Re-evaluated prior to induction Oxygen Delivery Method: Simple face mask Placement Confirmation: positive ETCO2

## 2020-11-24 NOTE — H&P (Signed)
History and Physical    [name] YHC:[id number] DOB: 01-29-1925 DOA: 11/23/2020  PCP: Gayland Curry, DO  Patient coming from: [facility]  I have personally briefly reviewed patient's old medical records in [place]  Chief Complaint: Coffee ground emesis   HPI: [name] is a 87 y.o. male with medical history significant for cognitive impairment, chronic back pain, CKD stage III, hypothyroidism and GERD who presents with concerns of coffee-ground emesis. Patient unable to provide history given cognitive impairment reportedly per documentation he was sent over from his SNF after staff noticed he had coffee-ground emesis.  Patient denies any pain and states that he feels fine.  States he only saw some dark substances in his vomitus but not sure that it was bloody.    ED Course: He was afebrile, briefly tachycardic that resolved spontaneously and was normotensive on room air.  Hemoglobin noted to be 6.5 from a baseline of around 11-12.  Lactate of 2.8.  Creatinine of 1.62 which is improved from baseline.  BG of 138.  FOBT positive.  ED physician has messaged  GI for consultation in the morning.  He has ordered 2 units of PRBC and started the patient on IV PPI infusion.  Review of Systems: Unable to fully obtain given patient's cognitive impairment  Past Medical History:  Diagnosis Date  . Acute kidney injury ([place]) 03/06/14  . Anemia, unspecified 03/06/14  . Arthritis   . BPH (benign prostatic hyperplasia)   . Cerebral atrophy ([place]) 03/06/14  . Cerebrovascular disease 03/06/14  . Degenerative disc disease, lumbar 03/06/14  . Diverticula, bladder acquired   . Edema 03/06/14  . GERD (gastroesophageal reflux disease)   . H/O: GI bleed 1986  . Hiatal hernia   . History of fall 03/06/14  . Hypercalcemia 03/06/14  . Hyperlipidemia   . Hypertension   . Impotence   . LFT elevation 03/06/14  . Lumbago 03/06/14  . Mallory-Weiss tear 1986  . Osteoporosis   . Rhabdomyolysis 03/06/14   . Scoliosis of cervical spine 03/06/14  . Scoliosis of lumbar spine 03/06/14  . Shingles   . Spermatocele    bilateral, left greater than right  . Troponin level elevated 03/06/14  . Weak 03/06/14    Past Surgical History:  Procedure Laterality Date  . BACK SURGERY  10/2009   ESI for surgery for lumbar spinal stenosis  . clubbed toe repair  2004  . COLONOSCOPY  08/15/2011  . INGUINAL HERNIA REPAIR Right 2006  . REPLACEMENT TOTAL KNEE BILATERAL  2007, 2008  . TONSILLECTOMY AND ADENOIDECTOMY  1939  . X-STOP IMPLANTATION       reports that he has quit smoking. His smoking use included cigarettes. He has never used smokeless tobacco. He reports current alcohol use of about 14.0 standard drinks of alcohol per week. He reports that he does not use drugs. Social History  Allergies  Allergen Reactions  . Amrix [Cyclobenzaprine]   . Shellfish Allergy     Family History  Problem Relation Age of Onset  . Alcoholism Father   . Liver disease Father   . Colon cancer Neg Hx      Prior to Admission medications   Medication Sig Start Date End Date Taking? Authorizing Provider  acetaminophen (TYLENOL) 325 MG tablet Take 650 mg by mouth 2 (two) times daily.    Yes [provider]  acetaminophen (TYLENOL) 325 MG tablet Take 650 mg by mouth 2 (two) times daily as needed.   Yes [provider]  amLODipine (NORVASC) 2.5 MG tablet Take 1 tablet by mouth daily. 02/03/19  Yes [provider]  antiseptic oral rinse (BIOTENE) LIQD 15 mLs by Mouth Rinse route 4 (four) times daily as needed for dry mouth.   Yes [provider]  bisacodyl (DULCOLAX) 10 MG suppository Place 10 mg rectally as needed for moderate constipation.   Yes [provider]  levothyroxine (SYNTHROID) 25 MCG tablet Take 1 tablet (25 mcg total) by mouth daily before breakfast. 04/17/20  Yes Reed, Tiffany L, DO  Melatonin 5 MG CAPS Take 1 capsule (5 mg total) by mouth at bedtime. 07/04/20   Yes Reed, Tiffany L, DO  ondansetron (ZOFRAN) 4 MG tablet Take 4 mg by mouth every 6 (six) hours as needed for nausea or vomiting.   Yes [provider]  Polyethyl Glycol-Propyl Glycol (SYSTANE) 0.4-0.3 % SOLN Two drops both eyes three times daily as needed for dry itchy eyes.   Yes [provider]  QUEtiapine (SEROQUEL) 25 MG tablet Take 25 mg by mouth 2 (two) times daily.  02/09/19 11/23/20 Yes [provider]  triamcinolone cream (KENALOG) 0.1 % Apply 1 application topically as needed. 02/25/19  Yes [provider]  vitamin B-12 (CYANOCOBALAMIN) 1000 MCG tablet Take 500 mcg by mouth daily.    Yes [provider]  lactose free nutrition (BOOST) LIQD Take 237 mLs by mouth 2 (two) times daily between meals. Patient not taking: Reported on 11/23/2020    [provider]  loperamide (IMODIUM) 2 MG capsule Take 2 mg by mouth 3 (three) times daily as needed for diarrhea or loose stools. Patient not taking: Reported on 11/23/2020    [provider]  pantoprazole (PROTONIX) 40 MG tablet Take 1 tablet (40 mg total) by mouth daily before supper. 07/04/20   Gayland Curry, DO    Physical Exam: Vitals:   11/23/20 2230 11/23/20 2300 11/23/20 2330 11/24/20 0030  BP: (!) 126/57 (!) 141/112 (!) 141/66 130/71  Pulse: (!) 44 98 94 79  Resp: (!) 21 19 (!) 21 18  Temp:    98.8 F (37.1 C)  TempSrc:      SpO2: 99% 95% 100% 100%    Constitutional: NAD, calm, comfortable, thin cachectic elderly male laying flat in bed Vitals:   11/23/20 2230 11/23/20 2300 11/23/20 2330 11/24/20 0030  BP: (!) 126/57 (!) 141/112 (!) 141/66 130/71  Pulse: (!) 44 98 94 79  Resp: (!) 21 19 (!) 21 18  Temp:    98.8 F (37.1 C)  TempSrc:      SpO2: 99% 95% 100% 100%   Eyes: PERRL, lids and conjunctivae normal ENMT: Mucous membranes are moist with dry blood on lips.  Neck: normal, supple Respiratory: clear to auscultation bilaterally, no wheezing, no crackles. Normal  respiratory effort. No accessory muscle use.  Cardiovascular: Regular rate and rhythm, no murmurs / rubs / gallops. No extremity edema. 2+ pedal pulses. No carotid bruits.  Abdomen: Cachectic shrunken abdomen with prominent rib cage with no tenderness, no masses palpated. . Bowel sounds positive.  Musculoskeletal: no clubbing / cyanosis. No joint deformity upper and lower extremities.contractures of all extremities. Skin: no rashes, lesions, ulcers. No induration Neurologic: CN 2-12 grossly intact. Sensation intact,  Strength 5/5 in all 4.  Psychiatric: Alert oriented to self and some current events only.    Labs on Admission: I have personally reviewed following labs and imaging studies  CBC: Recent Labs  Lab 11/23/20 1859  WBC 12.8*  NEUTROABS 9.6*  HGB 6.5*  HCT 20.9*  MCV 90.5  PLT 025   Basic Metabolic Panel: Recent Labs  Lab 11/23/20 1859  NA 140  K 3.6  CL 110  CO2 20*  GLUCOSE 138*  BUN 65*  CREATININE 1.62*  CALCIUM 7.8*   GFR: CrCl cannot be calculated (Unknown ideal weight.). Liver Function Tests: Recent Labs  Lab 11/23/20 1859  AST 20  ALT 18  ALKPHOS 58  BILITOT 0.9  PROT 6.1*  ALBUMIN 2.9*   Recent Labs  Lab 11/23/20 1859  LIPASE 28   No results for input(s): AMMONIA in the last 168 hours. Coagulation Profile: No results for input(s): INR, PROTIME in the last 168 hours. Cardiac Enzymes: No results for input(s): CKTOTAL, CKMB, CKMBINDEX, TROPONINI in the last 168 hours. BNP (last 3 results) No results for input(s): PROBNP in the last 8760 hours. HbA1C: No results for input(s): HGBA1C in the last 72 hours. CBG: No results for input(s): GLUCAP in the last 168 hours. Lipid Profile: No results for input(s): CHOL, HDL, LDLCALC, TRIG, CHOLHDL, LDLDIRECT in the last 72 hours. Thyroid Function Tests: No results for input(s): TSH, T4TOTAL, FREET4, T3FREE, THYROIDAB in the last 72 hours. Anemia Panel: No results for input(s): VITAMINB12,  FOLATE, FERRITIN, TIBC, IRON, RETICCTPCT in the last 72 hours. Urine analysis:    Component Value Date/Time   COLORURINE YELLOW 04/21/2007 1451   APPEARANCEUR CLEAR 04/21/2007 1451   LABSPEC 1.012 04/21/2007 1451   PHURINE 8.5 (H) 04/21/2007 1451   GLUCOSEU NEGATIVE 04/21/2007 1451   HGBUR NEGATIVE 04/21/2007 1451   BILIRUBINUR NEGATIVE 04/21/2007 1451   KETONESUR NEGATIVE 04/21/2007 1451   PROTEINUR NEGATIVE 04/21/2007 1451   UROBILINOGEN 0.2 04/21/2007 1451   NITRITE NEGATIVE 04/21/2007 1451   LEUKOCYTESUR  04/21/2007 1451    NEGATIVE MICROSCOPIC NOT DONE ON URINES WITH NEGATIVE PROTEIN, BLOOD, LEUKOCYTES, NITRITE, OR GLUCOSE <1000 mg/dL.    Radiological Exams on Admission: No results found.    Assessment/Plan  Acute blood loss anemia from GI bleed Hemoglobin of 6.5.  Positive FOBT. Last colonoscopy done in 2012 with no polyps or other findings Transfused 2 unit PRBC Continue IV PPI infusion EDP has message GI who will see in consultation in the morning- has seen [facility] in the past   CKD stage 3b creatinine stable   Hypothyroidism Continue levothyroxine  Hypertension Hold amlodipine for now given significant anemia  Cognitive impairment Continue Seroquel  DVT prophylaxis:SCDs Code Status: DNR paperwork noted at bedside Family Communication: No family at bedside  disposition Plan: Home with at least 2 midnight stays  Consults called:  Admission status: inpatient  Level of care: Progressive  Status is: Inpatient  Remains inpatient appropriate because:Inpatient level of care appropriate due to severity of illness   Dispo: The patient is from: SNF              Anticipated d/c is to: SNF              Patient currently is not medically stable to d/c.   Difficult to place patient No         Orene Desanctis DO [facility]   If 7PM-7AM, please contact night-coverage [email or web address]   11/24/2020, 1:08 AM

## 2020-11-24 NOTE — Progress Notes (Signed)
PROGRESS NOTE    [name]  LNL:[id number] DOB: 20-Aug-1924 DOA: 11/23/2020 PCP: Gayland Curry, DO   Brief Narrative:  The patient is a 87 year old elderly Caucasian male with a past medical history significant for but not limited to cognitive impairment, chronic back pain, chronic kidney disease stage IIIb, hypothyroidism, GERD as well as other comorbidities who presented with concerns of coffee-ground emesis.  He was unable to provide a subjective history given his cognitive impairment but reportedly per documentation he had been sent over from his SNF after the staff had noticed coffee-ground emesis.  Patient denies any pain and stated that he felt fine and states that only saw some dark substances in his vomit but not sure that it was bloody.  In the ED he is found to be briefly tachycardic that resolved spontaneously and patient was normotensive on room air.  His hemoglobin was noted to be 6.5 down from a baseline of 11-12.  Initially had a lactic of 2.8 and FOBT was positive.  The EDP message to GI for consultation and patient was ordered 2 units of PRBCs and started on IV pantoprazole infusion.  He underwent EGD this morning and it showed a very abnormal upper GI anatomy with an apparent large prolapsing hiatal hernia with a large deep not circumferential ulcer at the GE junction versus the proximal stomach as well as a dark eschar at the ulcer site.  There is no evidence of any active bleeding however the surrounding mucosa was edematous and friable and somewhat neoplastic appearing however not firm on forcep biopsies.  The GI physician recommended to await the pathology results and they are ordering a CT chest/abdomen/pelvis to get a better idea of his anatomy and they are recommending clear liquid diet for now as well as IV PPI twice daily.  Assessment & Plan:   Principal Problem:   Acute GI bleeding Active Problems:   CKD (chronic kidney disease), stage III (HCC)   HTN (hypertension)    Hypothyroidism   Cognitive impairment  Acute blood loss anemia from upper GI bleed -Patient presented with a hemoglobin of 6.5 and a hematocrit of 20.9.  Positive FOBT. -Last colonoscopy done in 2012 with no polyps or other findings -He was typed and screened and transfused 2 unit PRBC; after his transfusion his hemoglobin/hematocrit improved to 8.5/25.9 -Continued IV PPI infusion per GI recommendations -GI evaluated and recommended an EGD and patient was taken down and EGD showed a very abnormal upper GI anatomy with an apparent large prolapsing hiatal hernia with a large deep not circumferential ulcer at the GE junction versus the proximal stomach as well as a dark eschar at the ulcer site.  There is no evidence of any active bleeding however the surrounding mucosa was edematous and friable and somewhat neoplastic appearing however not firm on forcep biopsies.   -The GI Team recommended to await the pathology results and they are ordering a CT chest/abdomen/pelvis to get a better idea of his anatomy and they are recommending clear liquid diet for now as well as IV PPI twice daily. -Continue to monitor for signs and symptoms of bleeding and repeat CBC in the a.m.  Lactic Acidosis  -Presented with a lactic acid of 2.2 and this is now further trended down resolved to 1.0 -He was given 2 units of PRBCs  -We will start D5W + 20 mEQ of KCl at 75 mL's per hour   CKD stage 3b Metabolic acidosis, improved -Patient's BUNs/creatinine went from 65/1.62 and is  now trended down to 61/1.53 -We will start the patient on D5W + 20 mEQ at 75 mils per hour -Patient had a acidosis yesterday but this is improved today and his CO2 today is 22, anion gap is 6, chloride level is 118 -Avoid nephrotoxic medications, contrast dyes, hypotension and renally dose medications -Repeat CMP in a.m.  Hypernatremia -Patient's sodium went from 140 is now 146 We will start gentle IV fluid hydration with D5W +20 mEq of KCl at  75 mL's per hour We will continue monitor and trend and repeat CMP in a.m.  Hypokalemia -Patient's potassium this morning was 3.1 -Replete with IV KCl 40 mEq x 1 and with D5W +20 mEq of KCl at 75 mL's per hour -Check magnesium level We will continue monitor and replete as necessary -Repeat CMP in the a.m.  Hyperglycemia  -Patient blood sugar this morning was 100 on BMP and yesterday was 130 on CMP -Check hemoglobin A1c in the morning  -Continue monitor blood sugars carefully and if necessary will place on Sensitive Novolog SSI AC  Hypothyroidism -Continue Levothyroxine 25 mcg po Daily -Check TSH in the AM   Hypertension Hold amlodipine for now given significant anemia  Cognitive impairment -C/w Quetiapine 25 mg po BID   GOC: DNR,poA  DVT prophylaxis: SCDs Code Status: DO NOT RESUSCITATE  Family Communication: No family present at bedside  Disposition Plan: Pending further clinical Improvement and clearance by Gastroenterology   Status is: Inpatient  Remains inpatient appropriate because:Unsafe d/c plan, IV treatments appropriate due to intensity of illness or inability to take PO and Inpatient level of care appropriate due to severity of illness   Dispo: The patient is from: SNF              Anticipated d/c is to: SNF              Patient currently is not medically stable to d/c.   Difficult to place patient No  Consultants:   Gastroenterology  Procedures:  EGD Findings:      Very abnormal UGI anatomy with apparent large, prolapsing hiatal hernia.       I suspect much of his stomach is above the diaphragm.      Large, deep, non-cirumferential ulcer (3-4cm across) at GE junction vs.       proximal stomach (difficult to tell exact location given the abnormal       anatomy mentioned above. There is dark eschar at the ulcer site. There       is no active bleeding. The surrounding mucosa is edematous, friable,       somewhat neoplastic appearing however it was not  firm on forcep biopsies.      The exam was otherwise without abnormality. Impression:               - Very abnormal UGI anatomy with apparent large,                            prolapsing hiatal hernia. I suspect much of his                            stomach is above the diaphragm.                           - Large, deep, non-cirumferential ulcer (3-4cm  across) at [place] junction vs. proximal stomach                            (difficult to tell exact location given the                            abnormal anatomy mentioned above. There is dark                            eschar at the ulcer site. There is no active                            bleeding. The surrounding mucosa is edematous,                            friable, somewhat neoplastic appearing however it                            was not firm on forcep biopsies. Moderate Sedation:      Not Applicable - Patient had care per Anesthesia. Recommendation:           - Await pathology results.                           - I am ordering a CT scan chest, abd, pelvis to get                            a better idea of his anatomy.                           - Clear liquid diet for now.                           - IV PPI BID  Antimicrobials:  Anti-infectives (From admission, onward)   None        Subjective: Seen and examined at bedside and examined at bed and states that he is felt "fine".  No nausea or vomiting.  Has not had any more coffee-ground emesis in a few days.  He denies any chest pain or shortness of breath.  No other concerns or concerns this time.  Objective: Vitals:   11/24/20 0347 11/24/20 0420 11/24/20 0500 11/24/20 0552  BP: (!) 144/83 (!) 150/104 (!) 126/54 136/60  Pulse: 80 84 78 83  Resp: 12   18  Temp: 98.8 F (37.1 C) 98.6 F (37 C) 98.4 F (36.9 C) 98.2 F (36.8 C)  TempSrc: Oral Oral Oral   SpO2: 97% 93% 98%     Intake/Output Summary (Last 24 hours) at 11/24/2020 0844 Last data  filed at 11/24/2020 0600 Gross per 24 hour  Intake 90.6 ml  Output 200 ml  Net -109.4 ml   There were no vitals filed for this visit.  Examination: Physical Exam:  Constitutional: WN/WD elderly Caucasian male currently in NAD and appears calm and comfortable Eyes: Lids and conjunctivae normal, sclerae anicteric  ENMT: External Ears, Nose appear normal. Grossly normal hearing. Neck: Appears normal, supple, no cervical masses, normal ROM, no appreciable thyromegaly Respiratory: Clear to  auscultation bilaterally, no wheezing, rales, rhonchi or crackles. Normal respiratory effort and patient is not tachypenic. No accessory muscle use.  Cardiovascular: RRR, no murmurs / rubs / gallops. S1 and S2 auscultated. No extremity edema. Abdomen: Soft, non-tender, non-distended. Bowel sounds positive.  GU: Deferred. Musculoskeletal: No clubbing / cyanosis of digits/nails. No joint deformity upper and lower extremities. Skin: No rashes, lesions, ulcers on a limited skin evaluation. No induration; Warm and dry.  Neurologic: CN 2-12 grossly intact with no focal deficits. Romberg sign and cerebellar reflexes not assessed.  Psychiatric: Normal judgment and insight. Alert and oriented x 3. Normal mood and appropriate affect.   Data Reviewed: I have personally reviewed following labs and imaging studies  CBC: Recent Labs  Lab 11/23/20 1859  WBC 12.8*  NEUTROABS 9.6*  HGB 6.5*  HCT 20.9*  MCV 90.5  PLT 220   Basic Metabolic Panel: Recent Labs  Lab 11/23/20 1859  NA 140  K 3.6  CL 110  CO2 20*  GLUCOSE 138*  BUN 65*  CREATININE 1.62*  CALCIUM 7.8*   GFR: CrCl cannot be calculated (Unknown ideal weight.). Liver Function Tests: Recent Labs  Lab 11/23/20 1859  AST 20  ALT 18  ALKPHOS 58  BILITOT 0.9  PROT 6.1*  ALBUMIN 2.9*   Recent Labs  Lab 11/23/20 1859  LIPASE 28   No results for input(s): AMMONIA in the last 168 hours. Coagulation Profile: No results for input(s): INR,  PROTIME in the last 168 hours. Cardiac Enzymes: No results for input(s): CKTOTAL, CKMB, CKMBINDEX, TROPONINI in the last 168 hours. BNP (last 3 results) No results for input(s): PROBNP in the last 8760 hours. HbA1C: No results for input(s): HGBA1C in the last 72 hours. CBG: No results for input(s): GLUCAP in the last 168 hours. Lipid Profile: No results for input(s): CHOL, HDL, LDLCALC, TRIG, CHOLHDL, LDLDIRECT in the last 72 hours. Thyroid Function Tests: No results for input(s): TSH, T4TOTAL, FREET4, T3FREE, THYROIDAB in the last 72 hours. Anemia Panel: No results for input(s): VITAMINB12, FOLATE, FERRITIN, TIBC, IRON, RETICCTPCT in the last 72 hours. Sepsis Labs: Recent Labs  Lab 11/23/20 1900 11/23/20 2125 11/24/20 0200  LATICACIDVEN 2.8* 2.2* 1.8    Recent Results (from the past 240 hour(s))  SARS Coronavirus 2 by RT PCR (hospital order, performed in [place] hospital lab)     Status: None   Collection Time: 11/23/20  9:33 PM  Result Value Ref Range Status   SARS Coronavirus 2 NEGATIVE NEGATIVE Final    Comment: (NOTE) SARS-CoV-2 target nucleic acids are NOT DETECTED.  The SARS-CoV-2 RNA is generally detectable in upper and lower respiratory specimens during the acute phase of infection. The lowest concentration of SARS-CoV-2 viral copies this assay can detect is 250 copies / mL. A negative result does not preclude SARS-CoV-2 infection and should not be used as the sole basis for treatment or other patient management decisions.  A negative result may occur with improper specimen collection / handling, submission of specimen other than nasopharyngeal swab, presence of viral mutation(s) within the areas targeted by this assay, and inadequate number of viral copies (<250 copies / mL). A negative result must be combined with clinical observations, patient history, and epidemiological information.  Fact Sheet for Patients:    StrictlyIdeas.no  Fact Sheet for Healthcare Providers: BankingDealers.co.za  This test is not yet approved or  cleared by the [place] FDA and has been authorized for detection and/or diagnosis of SARS-CoV-2 by FDA under an Emergency Use  Authorization (EUA).  This EUA will remain in effect (meaning this test can be used) for the duration of the COVID-19 declaration under Section 564(b)(1) of the Act, 21 U.S.C. section 360bbb-3(b)(1), unless the authorization is terminated or revoked sooner.  Performed at [facility], [place] [address] [place], [place] [address]      RN Pressure Injury Documentation: Pressure Ulcer 02/27/14 Stage I -  Intact skin with non-blanchable redness of a localized area usually over a bony prominence. (Active)  02/27/14 0130  Location: Hip  Location Orientation: Right  Staging: Stage I -  Intact skin with non-blanchable redness of a localized area usually over a bony prominence.  Wound Description (Comments):   Present on Admission: Yes     Pressure Ulcer 02/27/14 Stage I -  Intact skin with non-blanchable redness of a localized area usually over a bony prominence. (Active)  02/27/14 0030  Location: Elbow  Location Orientation: Left  Staging: Stage I -  Intact skin with non-blanchable redness of a localized area usually over a bony prominence.  Wound Description (Comments):   Present on Admission: Yes     Estimated body mass index is 23.2 kg/m as calculated from the following:   Height as of 10/17/20: 5\' 8"  (1.727 m).   Weight as of 10/17/20: 69.2 kg.  Malnutrition Type:   Malnutrition Characteristics:   Nutrition Interventions:    Radiology Studies: No results found.  Scheduled Meds: . levothyroxine  25 mcg Oral Q0600  . [START ON 11/27/2020] pantoprazole  40 mg Intravenous Q12H  . QUEtiapine  25 mg Oral BID  . vitamin B-12  500 mcg Oral Daily   Continuous Infusions: .  pantoprozole (PROTONIX) infusion 6.4 mg/hr (11/24/20 0600)    LOS: 1 day   Kerney Elbe, DO [facility] PAGER is on [place]  If 7PM-7AM, please contact night-coverage [email or web address]

## 2020-11-24 NOTE — Plan of Care (Signed)
NOTED ATTEMPTS TO EDUCATE PT ON PLAN OF CARE SEEMS TO PRESENT SOME CHALLENGES. PT APPARENTLY HAS SOME COGNITIVE ISSUES WITH MEMORY. REINFORCEMENT REQUIRED ASA DAILY REMINDER OF PLAN OF CARE, TO AIDE IN RETENTION

## 2020-11-24 NOTE — Anesthesia Postprocedure Evaluation (Signed)
Anesthesia Post Note  Patient: [name]  Procedure(s) Performed: ESOPHAGOGASTRODUODENOSCOPY (EGD) WITH PROPOFOL (N/A ) BIOPSY     Patient location during evaluation: Endoscopy Anesthesia Type: MAC Level of consciousness: awake and alert Pain management: pain level controlled Vital Signs Assessment: post-procedure vital signs reviewed and stable Respiratory status: spontaneous breathing, nonlabored ventilation, respiratory function stable and patient connected to nasal cannula oxygen Cardiovascular status: stable and blood pressure returned to baseline Postop Assessment: no apparent nausea or vomiting Anesthetic complications: no   No complications documented.  Last Vitals:  Vitals:   11/24/20 1240 11/24/20 1258  BP: (!) 130/41 128/68  Pulse: (!) 57 77  Resp: 14 16  Temp:  36.7 C  SpO2: 100% 100%    Last Pain:  Vitals:   11/24/20 1258  TempSrc: Oral  PainSc:                  Sayward Horvath

## 2020-11-24 NOTE — ED Notes (Signed)
ED TO INPATIENT HANDOFF REPORT  Name/Age/Gender [name] [place]umbull 87 y.o. male  Code Status    Code Status Orders  (From admission, onward)         Start     Ordered   11/23/20 2327  Do not attempt resuscitation (DNR)  Continuous       Question Answer Comment  In the event of cardiac or respiratory ARREST Do not call a "code blue"   In the event of cardiac or respiratory ARREST Do not perform Intubation, CPR, defibrillation or ACLS   In the event of cardiac or respiratory ARREST Use medication by any route, position, wound care, and other measures to relive pain and suffering. May use oxygen, suction and manual treatment of airway obstruction as needed for comfort.      11/23/20 2326        Code Status History    Date Active Date Inactive Code Status Order ID Comments User Context   02/27/2014 0022 03/02/2014 1730 Full Code [id number]  Bynum Bellows, MD Inpatient   Advance Care Planning Activity    Advance Directive Documentation   Flowsheet Row Most Recent Value  Type of Advance Directive Out of facility DNR (pink MOST or yellow form)  Pre-existing out of facility DNR order (yellow form or pink MOST form) Pink MOST form placed in chart (order not valid for inpatient use)  "MOST" Form in Place? --      Home/SNF/Other Nursing Home  Chief Complaint Acute GI bleeding [K92.2]  Level of Care/Admitting Diagnosis ED Disposition    ED Disposition Condition [place]: [facility] [100102]  Level of Care: Progressive [102]  Admit to Progressive based on following criteria: GI, ENDOCRINE disease patients with GI bleeding, acute liver failure or pancreatitis, stable with diabetic ketoacidosis or thyrotoxicosis (hypothyroid) state.  May admit patient to [facility] or [facility] if equivalent level of care is available:: No  Covid Evaluation: Asymptomatic Screening Protocol (No Symptoms)  Diagnosis: Acute GI bleeding [253168]  Admitting  Physician: Orene Desanctis [[id number]]  Attending Physician: Orene Desanctis [[id number]]  Estimated length of stay: past midnight tomorrow  Certification:: I certify this patient will need inpatient services for at least 2 midnights       Medical History Past Medical History:  Diagnosis Date  . Acute kidney injury ([place]) 03/06/14  . Anemia, unspecified 03/06/14  . Arthritis   . BPH (benign prostatic hyperplasia)   . Cerebral atrophy ([place]) 03/06/14  . Cerebrovascular disease 03/06/14  . Degenerative disc disease, lumbar 03/06/14  . Diverticula, bladder acquired   . Edema 03/06/14  . GERD (gastroesophageal reflux disease)   . H/O: GI bleed 1986  . Hiatal hernia   . History of fall 03/06/14  . Hypercalcemia 03/06/14  . Hyperlipidemia   . Hypertension   . Impotence   . LFT elevation 03/06/14  . Lumbago 03/06/14  . Mallory-Weiss tear 1986  . Osteoporosis   . Rhabdomyolysis 03/06/14  . Scoliosis of cervical spine 03/06/14  . Scoliosis of lumbar spine 03/06/14  . Shingles   . Spermatocele    bilateral, left greater than right  . Troponin level elevated 03/06/14  . Weak 03/06/14    Allergies Allergies  Allergen Reactions  . Amrix [Cyclobenzaprine]   . Shellfish Allergy     IV Location/Drains/Wounds Patient Lines/Drains/Airways Status    Active Line/Drains/Airways    Name Placement date Placement time Site Days   Peripheral IV 03/01/14 Left;Anterior  Forearm 03/01/14  1232  Forearm  2460   Peripheral IV 11/23/20 11/23/20  2006  --  1   External Urinary Catheter 11/23/20  2055  --  1   Pressure Ulcer 02/27/14 Stage I -  Intact skin with non-blanchable redness of a localized area usually over a bony prominence. 02/27/14  0130  -- 2462   Pressure Ulcer 02/27/14 Stage I -  Intact skin with non-blanchable redness of a localized area usually over a bony prominence. 02/27/14  0030  -- 2462   Wound / Incision (Open or Dehisced) 02/27/14 Other (Comment) Elbow Right 02/27/14  0030  Elbow  2462   Wound /  Incision (Open or Dehisced) 02/27/14 Other (Comment) Knee Left 02/27/14  0030  Knee  2462   Wound / Incision (Open or Dehisced) 02/27/14 Other (Comment) Knee Right 02/27/14  0030  Knee  2462          Labs/Imaging Results for orders placed or performed during the hospital encounter of 11/23/20 (from the past 48 hour(s))  Comprehensive metabolic panel     Status: Abnormal   Collection Time: 11/23/20  6:59 PM  Result Value Ref Range   Sodium 140 135 - 145 mmol/L   Potassium 3.6 3.5 - 5.1 mmol/L   Chloride 110 98 - 111 mmol/L   CO2 20 (L) 22 - 32 mmol/L   Glucose, Bld 138 (H) 70 - 99 mg/dL    Comment: Glucose reference range applies only to samples taken after fasting for at least 8 hours.   BUN 65 (H) 8 - 23 mg/dL   Creatinine, Ser 1.62 (H) 0.61 - 1.24 mg/dL   Calcium 7.8 (L) 8.9 - 10.3 mg/dL   Total Protein 6.1 (L) 6.5 - 8.1 g/dL   Albumin 2.9 (L) 3.5 - 5.0 g/dL   AST 20 15 - 41 U/L   ALT 18 0 - 44 U/L   Alkaline Phosphatase 58 38 - 126 U/L   Total Bilirubin 0.9 0.3 - 1.2 mg/dL   GFR, Estimated 39 (L) >60 mL/min    Comment: (NOTE) Calculated using the CKD-EPI Creatinine Equation (2021)    Anion gap 10 5 - 15    Comment: Performed at [facility], [place] [address] [place], [place] [address]  Lipase, blood     Status: None   Collection Time: 11/23/20  6:59 PM  Result Value Ref Range   Lipase 28 11 - 51 U/L    Comment: Performed at [facility], [place] [address], [place], [place] [address]  CBC with Differential     Status: Abnormal   Collection Time: 11/23/20  6:59 PM  Result Value Ref Range   WBC 12.8 (H) 4.0 - 10.5 K/uL   RBC 2.31 (L) 4.22 - 5.81 MIL/uL   Hemoglobin 6.5 (LL) 13.0 - 17.0 g/dL    Comment: REPEATED TO VERIFY THIS CRITICAL RESULT HAS VERIFIED AND BEEN CALLED TO J.NASH, RN BY NATHAN THOMPSON ON 04 08 2022 AT 1928, AND HAS BEEN READ BACK. CRITICAL RESULT VERIFIED    HCT 20.9 (L) 39.0 - 52.0 %   MCV 90.5 80.0 - 100.0 fL   MCH  28.1 26.0 - 34.0 pg   MCHC 31.1 30.0 - 36.0 g/dL   RDW 15.4 11.5 - 15.5 %   Platelets 274 150 - 400 K/uL   nRBC 0.0 0.0 - 0.2 %   Neutrophils Relative % 76 %   Neutro Abs 9.6 (H) 1.7 - 7.7 K/uL  Lymphocytes Relative 13 %   Lymphs Abs 1.7 0.7 - 4.0 K/uL   Monocytes Relative 11 %   Monocytes Absolute 1.5 (H) 0.1 - 1.0 K/uL   Eosinophils Relative 0 %   Eosinophils Absolute 0.0 0.0 - 0.5 K/uL   Basophils Relative 0 %   Basophils Absolute 0.0 0.0 - 0.1 K/uL   Immature Granulocytes 0 %   Abs Immature Granulocytes 0.05 0.00 - 0.07 K/uL    Comment: Performed at [facility], [place] [address] [place], [place] [address]  Lactic acid, plasma     Status: Abnormal   Collection Time: 11/23/20  7:00 PM  Result Value Ref Range   Lactic Acid, Venous 2.8 (HH) 0.5 - 1.9 mmol/L    Comment: CRITICAL RESULT CALLED TO, READ BACK BY AND VERIFIED WITH: Lupita Dawn RN 11/23/20 @1945  BY P.HENDERSON Performed at [facility] [address] [place], [place] [address]   Type and screen [facility]     Status: None (Preliminary result)   Collection Time: 11/23/20  7:50 PM  Result Value Ref Range   ABO/RH(D) O POS    Antibody Screen NEG    Sample Expiration 11/26/2020,2359    Unit Number [id number]    Blood Component Type RED CELLS,LR    Unit division 00    Status of Unit ALLOCATED    Transfusion Status OK TO TRANSFUSE    Crossmatch Result Compatible    Unit Number [id number]    Blood Component Type RED CELLS,LR    Unit division 00    Status of Unit ISSUED    Transfusion Status OK TO TRANSFUSE    Crossmatch Result      Compatible Performed at [facility], [place] [address] [place], [place] [address]   POC occult blood, ED     Status: Abnormal   Collection Time: 11/23/20  9:22 PM  Result Value Ref Range   Fecal Occult Bld POSITIVE (A) NEGATIVE  Lactic acid, plasma     Status: Abnormal   Collection Time: 11/23/20   9:25 PM  Result Value Ref Range   Lactic Acid, Venous 2.2 (HH) 0.5 - 1.9 mmol/L    Comment: CRITICAL RESULT CALLED TO, READ BACK BY AND VERIFIED WITH: SARA DOSTER RN 11/23/20 @1024  BY P.HENDERSON Performed at [facility] [address], [place], [place] [address]   Prepare RBC (crossmatch)     Status: None   Collection Time: 11/23/20  9:34 PM  Result Value Ref Range   Order Confirmation      ORDER PROCESSED BY BLOOD BANK Performed at [facility], [place] [address] [place], [place] [address]    No results found.  Pending Labs Unresulted Labs (From admission, onward)          Start     Ordered   11/24/20 3151  Basic metabolic panel  Tomorrow morning,   R        11/23/20 2326   11/24/20 0500  CBC  Tomorrow morning,   R        11/23/20 2326   11/24/20 0114  Lactic acid, plasma  STAT Now then every 3 hours,   R (with STAT occurrences)      11/24/20 0113   11/23/20 2133  SARS CORONAVIRUS 2 (TAT 6-24 HRS) Nasopharyngeal Nasopharyngeal Swab  (Tier 3 - Symptomatic/asymptomatic with Precautions)  Once,   STAT       Question Answer Comment  Is this test  for diagnosis or screening Screening   Symptomatic for COVID-19 as defined by CDC No   Hospitalized for COVID-19 No   Admitted to ICU for COVID-19 No   Previously tested for COVID-19 Yes   Resident in a congregate (group) care setting Yes   Employed in healthcare setting No   Has patient completed COVID vaccination(s) (2 doses of Pfizer/Moderna 1 dose of [employer]) Unknown      11/23/20 2132   11/23/20 1833  Urinalysis, Routine w reflex microscopic  ONCE - STAT,   STAT        11/23/20 1833          Vitals/Pain Today's Vitals   11/24/20 0046 11/24/20 0100 11/24/20 0130 11/24/20 0200  BP:  112/67 113/86 137/60  Pulse: 83 (!) 41 (!) 41 86  Resp: 19 (!) 21 (!) 22 20  Temp:  98.8 F (37.1 C)    TempSrc:  Oral    SpO2: 100% 100% 100% 100%  PainSc:        Isolation  Precautions Airborne and Contact precautions  Medications Medications  pantoprazole (PROTONIX) 80 mg in sodium chloride 0.9 % 100 mL (0.8 mg/mL) infusion (8 mg/hr Intravenous New Bag/Given 11/23/20 2056)  pantoprazole (PROTONIX) injection 40 mg (has no administration in time range)  QUEtiapine (SEROQUEL) tablet 25 mg (has no administration in time range)  levothyroxine (SYNTHROID) tablet 25 mcg (has no administration in time range)  vitamin B-12 (CYANOCOBALAMIN) tablet 500 mcg (has no administration in time range)  pantoprazole (PROTONIX) 80 mg in sodium chloride 0.9 % 100 mL IVPB (0 mg Intravenous Stopped 11/23/20 2053)  0.9 %  sodium chloride infusion (10 mL/hr Intravenous New Bag/Given 11/23/20 2300)    Mobility non-ambulatory

## 2020-11-24 NOTE — Transfer of Care (Signed)
Immediate Anesthesia Transfer of Care Note  Patient: [name]  Procedure(s) Performed: ESOPHAGOGASTRODUODENOSCOPY (EGD) WITH PROPOFOL (N/A ) BIOPSY  Patient Location: PACU  Anesthesia Type:MAC  Level of Consciousness: awake, alert , oriented and patient cooperative  Airway & Oxygen Therapy: Patient Spontanous Breathing and Patient connected to face mask oxygen  Post-op Assessment: Report given to RN, Post -op Vital signs reviewed and stable and Patient moving all extremities X 4  Post vital signs: stable  Last Vitals:  Vitals Value Taken Time  BP 114/33 11/24/20 1230  Temp    Pulse 69 11/24/20 1231  Resp 16 11/24/20 1231  SpO2 100 % 11/24/20 1231  Vitals shown include unvalidated device data.  Last Pain:  Vitals:   11/24/20 1154  TempSrc:   PainSc: 0-No pain         Complications: No complications documented.

## 2020-11-25 ENCOUNTER — Encounter (HOSPITAL_COMMUNITY): Payer: Self-pay | Admitting: Family Medicine

## 2020-11-25 DIAGNOSIS — Z9989 Dependence on other enabling machines and devices: Secondary | ICD-10-CM

## 2020-11-25 DIAGNOSIS — M4155 Other secondary scoliosis, thoracolumbar region: Secondary | ICD-10-CM | POA: Diagnosis present

## 2020-11-25 DIAGNOSIS — E440 Moderate protein-calorie malnutrition: Secondary | ICD-10-CM

## 2020-11-25 DIAGNOSIS — H9190 Unspecified hearing loss, unspecified ear: Secondary | ICD-10-CM

## 2020-11-25 DIAGNOSIS — K920 Hematemesis: Secondary | ICD-10-CM | POA: Diagnosis not present

## 2020-11-25 DIAGNOSIS — K440 Diaphragmatic hernia with obstruction, without gangrene: Secondary | ICD-10-CM | POA: Diagnosis present

## 2020-11-25 DIAGNOSIS — D500 Iron deficiency anemia secondary to blood loss (chronic): Secondary | ICD-10-CM

## 2020-11-25 DIAGNOSIS — R1909 Other intra-abdominal and pelvic swelling, mass and lump: Secondary | ICD-10-CM | POA: Diagnosis present

## 2020-11-25 DIAGNOSIS — K253 Acute gastric ulcer without hemorrhage or perforation: Secondary | ICD-10-CM | POA: Diagnosis not present

## 2020-11-25 DIAGNOSIS — K449 Diaphragmatic hernia without obstruction or gangrene: Secondary | ICD-10-CM | POA: Diagnosis not present

## 2020-11-25 DIAGNOSIS — N1330 Unspecified hydronephrosis: Secondary | ICD-10-CM

## 2020-11-25 DIAGNOSIS — M4185 Other forms of scoliosis, thoracolumbar region: Secondary | ICD-10-CM | POA: Diagnosis present

## 2020-11-25 DIAGNOSIS — D509 Iron deficiency anemia, unspecified: Secondary | ICD-10-CM | POA: Diagnosis present

## 2020-11-25 LAB — COMPREHENSIVE METABOLIC PANEL
ALT: 13 U/L (ref 0–44)
AST: 16 U/L (ref 15–41)
Albumin: 2.5 g/dL — ABNORMAL LOW (ref 3.5–5.0)
Alkaline Phosphatase: 50 U/L (ref 38–126)
Anion gap: 7 (ref 5–15)
BUN: 41 mg/dL — ABNORMAL HIGH (ref 8–23)
CO2: 20 mmol/L — ABNORMAL LOW (ref 22–32)
Calcium: 7.6 mg/dL — ABNORMAL LOW (ref 8.9–10.3)
Chloride: 112 mmol/L — ABNORMAL HIGH (ref 98–111)
Creatinine, Ser: 1.41 mg/dL — ABNORMAL HIGH (ref 0.61–1.24)
GFR, Estimated: 46 mL/min — ABNORMAL LOW (ref >60)
Glucose, Bld: 92 mg/dL (ref 70–99)
Potassium: 3.4 mmol/L — ABNORMAL LOW (ref 3.5–5.1)
Sodium: 139 mmol/L (ref 135–145)
Total Bilirubin: 0.9 mg/dL (ref 0.3–1.2)
Total Protein: 5.2 g/dL — ABNORMAL LOW (ref 6.5–8.1)

## 2020-11-25 LAB — TYPE AND SCREEN
ABO/RH(D): O POS
Antibody Screen: NEGATIVE
Unit division: 0
Unit division: 0

## 2020-11-25 LAB — CBC WITH DIFFERENTIAL/PLATELET
Abs Immature Granulocytes: 0.03 10*3/uL (ref 0.00–0.07)
Basophils Absolute: 0 10*3/uL (ref 0.0–0.1)
Basophils Relative: 0 %
Eosinophils Absolute: 0.2 10*3/uL (ref 0.0–0.5)
Eosinophils Relative: 2 %
HCT: 24.1 % — ABNORMAL LOW (ref 39.0–52.0)
Hemoglobin: 7.6 g/dL — ABNORMAL LOW (ref 13.0–17.0)
Immature Granulocytes: 0 %
Lymphocytes Relative: 23 %
Lymphs Abs: 2.3 10*3/uL (ref 0.7–4.0)
MCH: 29.1 pg (ref 26.0–34.0)
MCHC: 31.5 g/dL (ref 30.0–36.0)
MCV: 92.3 fL (ref 80.0–100.0)
Monocytes Absolute: 1.2 10*3/uL — ABNORMAL HIGH (ref 0.1–1.0)
Monocytes Relative: 13 %
Neutro Abs: 5.9 10*3/uL (ref 1.7–7.7)
Neutrophils Relative %: 62 %
Platelets: 207 10*3/uL (ref 150–400)
RBC: 2.61 MIL/uL — ABNORMAL LOW (ref 4.22–5.81)
RDW: 15.9 % — ABNORMAL HIGH (ref 11.5–15.5)
WBC: 9.6 10*3/uL (ref 4.0–10.5)
nRBC: 0 % (ref 0.0–0.2)

## 2020-11-25 LAB — BPAM RBC
Blood Product Expiration Date: 202205092359
Blood Product Expiration Date: 202205102359
ISSUE DATE / TIME: 202204090005
ISSUE DATE / TIME: 202204090527
Unit Type and Rh: 5100
Unit Type and Rh: 5100

## 2020-11-25 LAB — MAGNESIUM: Magnesium: 1.7 mg/dL (ref 1.7–2.4)

## 2020-11-25 LAB — PREALBUMIN: Prealbumin: 11.7 mg/dL — ABNORMAL LOW (ref 18–38)

## 2020-11-25 LAB — HEMOGLOBIN AND HEMATOCRIT, BLOOD
HCT: 24.7 % — ABNORMAL LOW (ref 39.0–52.0)
Hemoglobin: 7.2 g/dL — ABNORMAL LOW (ref 13.0–17.0)

## 2020-11-25 LAB — PHOSPHORUS: Phosphorus: 2.3 mg/dL — ABNORMAL LOW (ref 2.5–4.6)

## 2020-11-25 MED ORDER — CHLORHEXIDINE GLUCONATE CLOTH 2 % EX PADS
6.0000 | MEDICATED_PAD | Freq: Every day | CUTANEOUS | Status: DC
  Administered 2020-11-25 – 2020-11-27 (×3): 6 via TOPICAL

## 2020-11-25 MED ORDER — POTASSIUM PHOSPHATES 15 MMOLE/5ML IV SOLN
10.0000 mmol | Freq: Once | INTRAVENOUS | Status: AC
  Administered 2020-11-25: 10 mmol via INTRAVENOUS
  Filled 2020-11-25: qty 3.33

## 2020-11-25 MED ORDER — GABAPENTIN 100 MG PO CAPS
200.0000 mg | ORAL_CAPSULE | Freq: Once | ORAL | Status: AC
  Administered 2020-11-25: 200 mg via ORAL
  Filled 2020-11-25: qty 2

## 2020-11-25 MED ORDER — TAMSULOSIN HCL 0.4 MG PO CAPS
0.4000 mg | ORAL_CAPSULE | Freq: Every day | ORAL | Status: DC
  Administered 2020-11-25 – 2020-11-30 (×6): 0.4 mg via ORAL
  Filled 2020-11-25 (×6): qty 1

## 2020-11-25 MED ORDER — POTASSIUM CHLORIDE CRYS ER 20 MEQ PO TBCR
40.0000 meq | EXTENDED_RELEASE_TABLET | Freq: Once | ORAL | Status: AC
  Administered 2020-11-25: 40 meq via ORAL
  Filled 2020-11-25: qty 2

## 2020-11-25 NOTE — Progress Notes (Signed)
Foley cath placed as ordered due to chronic urinary retention and urology recommendation. SRP, RN

## 2020-11-25 NOTE — Progress Notes (Signed)
Pt resting up in chair, eating lunch denies pain and discomfort. Will cont to monitor. SRP, RN

## 2020-11-25 NOTE — Progress Notes (Signed)
GI Progress Note  Chief Complaint: Anemia and dysphagia  History:  Signout received from Dr. Ardis Hughs yesterday, and his upper endoscopy report was reviewed.  This pleasant patient cannot provide much additional history or review of systems.  He does not seem to describe dysphagia, but it is not clear how accurately he recalls his symptoms.  He does not know the findings from yesterday's procedure.  He currently denies chest pain or dyspnea. No further hematemesis reported since admission, no melena reported  Objective:   Current Facility-Administered Medications:  .  dextrose 5 % with KCl 20 mEq / L  infusion, 20 mEq, Intravenous, Continuous, Raiford Noble [place], [place], Last Rate: 75 mL/hr at 11/25/20 0558, 20 mEq at 11/25/20 0558 .  levothyroxine (SYNTHROID) tablet 25 mcg, 25 mcg, Oral, Q0600, Milus Banister, MD, 25 mcg at 11/25/20 0555 .  pantoprazole (PROTONIX) injection 40 mg, 40 mg, Intravenous, Q12H, Milus Banister, MD, 40 mg at 11/24/20 2145 .  QUEtiapine (SEROQUEL) tablet 25 mg, 25 mg, Oral, BID, Milus Banister, MD, 25 mg at 11/24/20 2144 .  vitamin B-12 (CYANOCOBALAMIN) tablet 500 mcg, 500 mcg, Oral, Daily, Milus Banister, MD  . dextrose 5 % with KCl 20 mEq / L 20 mEq (11/25/20 0558)     Vital signs in last 24 hrs: Vitals:   11/24/20 2133 11/25/20 0630  BP: 132/64 116/66  Pulse: 64 63  Resp: 18 18  Temp: 98.5 F (36.9 C) 97.7 F (36.5 C)  SpO2: 100% 97%    Intake/Output Summary (Last 24 hours) at 11/25/2020 3810 Last data filed at 11/25/2020 0600 Gross per 24 hour  Intake 3349.05 ml  Output 1400 ml  Net 1949.05 ml     Physical Exam Frail elderly man laying in bed.  He is awake and conversational.  No dysarthria or dysphonia, but he has a soft vocal tone.  HEENT: sclera anicteric, oral mucosa without lesions -dry  Neck: supple, no thyromegaly, JVD or lymphadenopathy  Cardiac: RRR without murmurs, S1S2 heard, no peripheral edema  Pulm: clear to  auscultation bilaterally, normal RR and low effort noted  Abdomen: soft, no tenderness, with active bowel sounds. No guarding or palpable hepatosplenomegaly.  He has a periumbilical fullness (see CT report below)  Skin; warm and dry, no jaundice.  Pale He has a 2 cm firm superficial circumscribed mobile but irregularly shaped soft tissue lesion right groin.  It also has a small black spot on the surface.  The patient reports it has been present quite some time. Recent Labs:  CBC Latest Ref Rng & Units 11/25/2020 11/24/2020 11/23/2020  WBC 4.0 - 10.5 K/uL 9.6 11.9(H) 12.8(H)  Hemoglobin 13.0 - 17.0 g/dL 7.6(L) 8.5(L) 6.5(LL)  Hematocrit 39.0 - 52.0 % 24.1(L) 25.9(L) 20.9(L)  Platelets 150 - 400 K/uL 207 231 274    No results for input(s): INR in the last 168 hours. CMP Latest Ref Rng & Units 11/25/2020 11/24/2020 11/23/2020  Glucose 70 - 99 mg/dL 92 100(H) 138(H)  BUN 8 - 23 mg/dL 41(H) 61(H) 65(H)  Creatinine 0.61 - 1.24 mg/dL 1.41(H) 1.53(H) 1.62(H)  Sodium 135 - 145 mmol/L 139 146(H) 140  Potassium 3.5 - 5.1 mmol/L 3.4(L) 3.1(L) 3.6  Chloride 98 - 111 mmol/L 112(H) 118(H) 110  CO2 22 - 32 mmol/L 20(L) 22 20(L)  Calcium 8.9 - 10.3 mg/dL 7.6(L) 8.0(L) 7.8(L)  Total Protein 6.5 - 8.1 g/dL 5.2(L) - 6.1(L)  Total Bilirubin 0.3 - 1.2 mg/dL 0.9 - 0.9  Alkaline Phos  38 - 126 U/L 50 - 58  AST 15 - 41 U/L 16 - 20  ALT 0 - 44 U/L 13 - 18     Radiologic studies:  CLINICAL DATA:  Hematemesis, suspect large hiatal hernia   EXAM: CT CHEST, ABDOMEN, AND PELVIS WITH CONTRAST   TECHNIQUE: Multidetector CT imaging of the chest, abdomen and pelvis was performed following the standard protocol during bolus administration of intravenous contrast.   CONTRAST:  15mL OMNIPAQUE IOHEXOL 300 MG/ML SOLN, additional oral enteric contrast   COMPARISON:  None.   FINDINGS: CT CHEST FINDINGS   Cardiovascular: Aortic atherosclerosis. Normal heart size. Scattered coronary artery calcifications. No  pericardial effusion.   Mediastinum/Nodes: No enlarged mediastinal, hilar, or axillary lymph nodes. Large hiatal hernia with complete intrathoracic position of the stomach as well as portions of the pancreatic head and neck and multiple loops of nonobstructed small bowel. Thyroid gland, trachea, and esophagus demonstrate no significant findings.   Lungs/Pleura: Trace bilateral pleural effusions. Compressive atelectasis of the right lower lobe associated with a large hiatal hernia.   Musculoskeletal: No chest wall mass or suspicious bone lesions identified.   CT ABDOMEN PELVIS FINDINGS   Hepatobiliary: No solid liver abnormality is seen. No gallstones, gallbladder wall thickening, or biliary dilatation.   Pancreas: Unremarkable. No pancreatic ductal dilatation or surrounding inflammatory changes.   Spleen: Normal in size without significant abnormality.   Adrenals/Urinary Tract: Adrenal glands are unremarkable. Severe bilateral hydronephrosis and hydroureter to the ureterovesicular junctions. There is severe urinary bladder wall thickening and distention of the urinary bladder.   Stomach/Bowel: Stomach is within normal limits. Appendix appears normal. No evidence of bowel wall thickening, distention, or inflammatory changes.   Vascular/Lymphatic: Aortic atherosclerosis. No enlarged abdominal or pelvic lymph nodes.   Reproductive: TURP defect of the prostate.   Other: There is a midline epigastric hernia containing multiple loops of nonobstructed small bowel (series 2, image 72). No abdominopelvic ascites.   Musculoskeletal: No acute or significant osseous findings.   IMPRESSION: 1. Large hiatal hernia with complete intrathoracic position of the stomach as well as portions of the pancreatic head and neck and multiple loops of nonobstructed small bowel. 2. Trace bilateral pleural effusions. Compressive atelectasis of the right lower lobe associated with a large hiatal  hernia. 3. Severe bilateral hydronephrosis and hydroureter to the ureterovesicular junctions. There is severe urinary bladder wall thickening and distention of the urinary bladder. No obstructing calculi or other lesion identified. 4. TURP defect of the prostate. 5. There is a midline epigastric hernia containing multiple loops of nonobstructed small bowel. 6. Coronary artery disease.   Aortic Atherosclerosis (ICD10-I70.0).     Electronically Signed   By: Eddie Candle M.D.   On: 11/24/2020 23:27   CT images were personally reviewed with Dr. 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  Chronic proximal gastric ulcer related to this severe herniation.  The exact anatomy was difficult to determine at the time of endoscopy, but this ulcer appeared to be in the proximal stomach.  He does not have ongoing overt GI bleeding.  Currently on clear liquid diet and will remain there pending surgical consult and plan. This patient's very large  hiatal hernia need surgical repair. Plan: I have consulted Dr. Neysa Bonito of [facility] surgery, who was gracious enough to see the patient this morning and he or one of his associates will follow the patient, review further studies, and have a discussion with patient and family about how to proceed.  Twice daily IV PPI  Every 12 hour hemoglobin and hematocrit x4, transfuse as needed at threshold per hospitalist service or anesthesia prior to surgery.  (Daughter [name] updated by phone)  I spent a total of 35 minutes with the patient reviewing hospital notes, imaging reports,  pathology (if applicable),  labs and examining the patient as well as establishing an assessment and plan that was discussed with the patient.  > 50% of time was spent in direct patient care.     Nelida Meuse III Office: [phone]

## 2020-11-25 NOTE — Progress Notes (Signed)
PROGRESS NOTE    [name]  IEP:[id number] DOB: 1924/08/31 DOA: 11/23/2020 PCP: Gayland Curry, DO   Brief Narrative:  The patient is a 87 year old elderly Caucasian male with a past medical history significant for but not limited to cognitive impairment, chronic back pain, chronic kidney disease stage IIIb, hypothyroidism, GERD as well as other comorbidities who presented with concerns of coffee-ground emesis.  He was unable to provide a subjective history given his cognitive impairment but reportedly per documentation he had been sent over from his SNF after the staff had noticed coffee-ground emesis.  Patient denies any pain and stated that he felt fine and states that only saw some dark substances in his vomit but not sure that it was bloody.  In the ED he is found to be briefly tachycardic that resolved spontaneously and patient was normotensive on room air.  His hemoglobin was noted to be 6.5 down from a baseline of 11-12.  Initially had a lactic of 2.8 and FOBT was positive.  The EDP message to GI for consultation and patient was ordered 2 units of PRBCs and started on IV pantoprazole infusion.  He underwent EGD this morning and it showed a very abnormal upper GI anatomy with an apparent large prolapsing hiatal hernia with a large deep not circumferential ulcer at the GE junction versus the proximal stomach as well as a dark eschar at the ulcer site.  There is no evidence of any active bleeding however the surrounding mucosa was edematous and friable and somewhat neoplastic appearing however not firm on forcep biopsies.  The GI physician recommended to await the pathology results and they are ordering a CT chest/abdomen/pelvis to get a better idea of his anatomy and they are recommending clear liquid diet for now as well as IV PPI twice daily.  CT of the chest abdomen pelvis was done large hiatal hernia with complete intrathoracic position in the stomach as well as portions of the pancreatic head  and neck and multiple loops of nonobstructed small bowel.  Trace bilateral pleural effusions noted and compressive atelectasis of the right lower lobe associated with large hiatal hernia.  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  There is also a midline epigastric hernia containing multiple loops of nonobstructive small bowel and patient was noted to have CAD as well.  Gastroenterology recommends continuing on a clear liquid diet and feel that the patient is hiatal hernia is very large and needs surgical repair and Dr. Johney Maine of [facility] has evaluated.  GI recommends cycling H&H every 12 hours for 4 occurrences and transfusion as needed less than 7. General Surgery feels that the standard of care would be considering a reduction repair of his large hiatal hernia given his advanced age and some deconditioning chronic malnutrition they are recommending medical and cardiac clearance.  They feel he would benefit from a feeding gastrostomy tube perioperatively and Dr. Johney Maine feels that he has some dysphagia issues.  Dr. Rosendo Gros to evaluate tomorrow but palliative care has also been consulted for further goals of care discussion  Given his findings on CT scan urology was consulted and they recommended Foley catheter placement initially patient refused and then was agreeable so this was placed to help decompression of the hydronephrosis.  Assessment & Plan:  Principal Problem:   Acute GI bleeding from [facility] ulcerations in giant hiatal hernia Active Problems:   Protein-calorie malnutrition, moderate (HCC)   CKD (chronic kidney disease), stage III (HCC)   Umbilical hernia - reducible   HTN (hypertension)   Hypothyroidism   Cognitive impairment    Incarcerated giant hiatal hernia   HOH (hard of hearing)   Scoliosis of thoracolumbar region due to degenerative disease of spine in adult   Right groin mass c/w sebaceous cyst   [name], acute with bleeding & anemia   IDA (iron deficiency anemia)   Uses roller walker  Acute blood loss anemia from upper GI bleed -Patient presented with a hemoglobin of 6.5 and a hematocrit of 20.9.  Positive FOBT. -Last colonoscopy done in 2012 with no polyps or other findings -He was typed and screened and transfused 2 unit PRBC; after his transfusion his hemoglobin/hematocrit improved to 8.5/25.9 yesterday and dropped to 7.6/24.1 today  -Continued IV PPI infusion per GI recommendations -GI evaluated and recommended an EGD and patient was taken down and EGD showed a very abnormal upper GI anatomy with an apparent large prolapsing hiatal hernia with a large deep not circumferential ulcer at the GE junction versus the proximal stomach as well as a dark eschar at the ulcer site.  There is no evidence of any active bleeding however the surrounding mucosa was edematous and friable and somewhat neoplastic appearing however not firm on forcep biopsies.   -The GI Team recommended to await the pathology results and they are ordering a CT chest/abdomen/pelvis to get a better idea of his anatomy and they are recommending clear liquid diet for now as well as IV PPI twice daily. -CT scan was done and showed a large hiatal hernia with complete intrathoracic position in the stomach as well as portions of the pancreatic head and neck and multiple loops of nonobstructed small bowel.  Trace bilateral pleural effusions noted and compressive atelectasis of the right lower lobe associated with large hiatal hernia.  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  There is also a midline epigastric hernia containing multiple loops of nonobstructive small bowel and patient was noted to have CAD as well.  -Gastroenterology recommends continuing on a clear liquid diet and feel that the patient is hiatal hernia is very large and needs surgical repair and Dr. Johney Maine of [facility] has evaluated.  GI recommends cycling H&H every 12 hours for 4 occurrences and transfusion as needed less than 7. -General Surgery feels that the standard of care would be considering a reduction repair of his large hiatal hernia given his advanced age and some deconditioning chronic malnutrition they are recommending medical and cardiac clearance.  They feel he would benefit from a feeding gastrostomy tube perioperatively and Dr. Johney Maine feels that he has some dysphagia issues.  Dr. Rosendo Gros to evaluate tomorrow but palliative care has also been consulted for further goals of care discussion -Continue to monitor for signs and symptoms of bleeding and follow H/H q12 x4 and repeat CBC in the a.m. -Patient's Pre-Albumin was 11.7 -Palliative Care Consulted for further GOC Discussion and to be done tomorrow   Lactic Acidosis  -Presented with a lactic acid of 2.2 and this is now further trended down resolved to 1.0 -He  was given 2 units of PRBCs  -We will start D5W + 20 mEQ of KCl at 75 mL's per hour   Bilateral Hydronephrosis in the setting of Urinary Retention -Urology consulted given findings on CT scan as above -Dr. Claudia Desanctis discussed placement of indwelling Foley catheter to help decompress the patient's hydronephrosis however the patient is asymptomatic and kidney function is at baseline; initially was unsure whether he would like the Foley catheter not and after discussion with his healthcare power of attorney they were agreeable until Foley catheter was placed -Dr. Claudia Desanctis is unsure that placing a Foley catheter improve  his clinical status and he has no evidence of acute on chronic renal failure infection or discomfort and had recommended observation but Foley catheter was placed  CKD stage 3b Metabolic acidosis,  -Patient's BUNs/creatinine went from 65/1.62 and is now trended down to 61/1.53 -> 41/1.41 -We will start the patient on D5W + 20 mEQ at 75 mils per hour -Patient has a slight acidosis with a CO2 of 20, anion gap 7, chloride level of 112 -Avoid nephrotoxic medications, contrast dyes, hypotension and renally dose medications -Repeat CMP in a.m.  Hypernatremia -Patient's sodium went from 140 is now 146 -> 139 We will start gentle IV fluid hydration with D5W +20 mEq of KCl at 75 mL's per hour We will continue monitor and trend and repeat CMP in a.m.  Hypokalemia -Patient's potassium this morning was 3.1 -> 3.4 -Replete with po KCl 40 mEQ x1 and IV K Phos 10 mmol and with D5W +20 mEq of KCl at 75 mL's per hour -Check magnesium level We will continue monitor and replete as necessary -Repeat CMP in the a.m.  Hypophosphatemia -Patient's Phos Level was 2.3 -Replete with IV K Phos  10 mmol -Continue to Monitor and Replete as Necessary  -Repeat Phos Level in the AM   Hyperglycemia  -Patient blood sugar this morning was 100 on BMP and yesterday was 130 on CMP -Check hemoglobin A1c in the morning  -Continue monitor blood sugars carefully and if necessary will place on Sensitive Novolog SSI AC  Hypothyroidism -Continue Levothyroxine 25 mcg po Daily -Check TSH in the AM   Hypertension -Continue to Hold amlodipine for now given significant anemia  Cognitive impairment -C/w Quetiapine 25 mg po BID   GOC: DNR,poA  DVT prophylaxis: SCDs Code Status: DO NOT RESUSCITATE  Family Communication: Discussed with his [place]  Disposition Plan: Pending further clinical Improvement and clearance by Gastroenterology   Status is: Inpatient  Remains inpatient  appropriate because:Unsafe d/c plan, IV treatments appropriate due to intensity of illness or inability to take PO and Inpatient level of care appropriate due to severity of illness   Dispo: The patient is from: SNF              Anticipated d/c is to: SNF              Patient currently is not medically stable to d/c.   Difficult to place patient No  Consultants:   Gastroenterology  General Surgery  Palliative Care Medicine   Procedures:  EGD Findings:      Very abnormal UGI anatomy with apparent large, prolapsing hiatal hernia.       I suspect much of his stomach is above the diaphragm.      Large, deep, non-cirumferential ulcer (3-4cm across) at GE junction vs.       proximal stomach (difficult to tell exact location given the  abnormal       anatomy mentioned above. There is dark eschar at the ulcer site. There       is no active bleeding. The surrounding mucosa is edematous, friable,       somewhat neoplastic appearing however it was not firm on forcep biopsies.      The exam was otherwise without abnormality. Impression:               - Very abnormal UGI anatomy with apparent large,                            prolapsing hiatal hernia. I suspect much of his                            stomach is above the diaphragm.                           - Large, deep, non-cirumferential ulcer (3-4cm                            across) at GE junction vs. proximal stomach                            (difficult to tell exact location given the                            abnormal anatomy mentioned above. There is dark                            eschar at the ulcer site. There is no active                            bleeding. The surrounding mucosa is edematous,                            friable, somewhat neoplastic appearing however it                            was not firm on forcep biopsies. Moderate Sedation:      Not Applicable - Patient had care per Anesthesia. Recommendation:            - Await pathology results.                           - I am ordering a CT scan chest, abd, pelvis to get                            a better idea of his anatomy.                           - Clear liquid diet for now.                           - IV PPI BID  Antimicrobials:  Anti-infectives (From admission, onward)   None  Subjective: Seen and examined at bedside and he was seen multiple times throughout the day.  He states he feels "fine".  No nausea or vomiting.  States that he is not having pain.  Has not had any more emesis and states that he feels comfortable.  Healthcare power of attorney at bedside and wanted to discuss extensively about options and requested palliative consultation which is already been placed.  Patient has no complaints or concerns at this time and was finally agreeable for Foley catheter later on that afternoon.   Objective: Vitals:   11/25/20 0630 11/25/20 1228 11/25/20 1256 11/25/20 1448  BP: 116/66 133/68    Pulse: 63 (!) 40    Resp: 18 20    Temp: 97.7 F (36.5 C) 97.7 F (36.5 C)    TempSrc: Oral Oral Oral   SpO2: 97% 100%    Weight:   70.1 kg 67.1 kg  Height:   5\' 8"  (1.727 m)     Intake/Output Summary (Last 24 hours) at 11/25/2020 1504 Last data filed at 11/25/2020 0600 Gross per 24 hour  Intake 1969.05 ml  Output 900 ml  Net 1069.05 ml   Filed Weights   11/25/20 1256 11/25/20 1448  Weight: 70.1 kg 67.1 kg   Examination: Physical Exam:  Constitutional: WN/WD elderly Caucasian male in NAD and appears calm and comfortable Eyes: Lids and conjunctivae normal, sclerae anicteric  ENMT: External Ears, Nose appear normal. Grossly normal hearing.  Neck: Appears normal, supple, no cervical masses, normal ROM, no appreciable thyromegaly; no JVD Respiratory: Diminished to auscultation bilaterally, no wheezing, rales, rhonchi or crackles. Normal respiratory effort and patient is not tachypenic. No accessory muscle use.  Cardiovascular: RRR, no  murmurs / rubs / gallops. S1 and S2 auscultated.  Abdomen: Soft, non-tender, non-distended. Bowel sounds positive.  GU: Deferred. Musculoskeletal: No clubbing / cyanosis of digits/nails. No joint deformity upper and lower extremities. Skin: No rashes, lesions, ulcers on a limited skin evaluation. No induration; Warm and dry.  Neurologic: CN 2-12 grossly intact with no focal deficits. Romberg sign and cerebellar reflexes not assessed.  Psychiatric: Normal judgment and insight. Alert and oriented x 3. Normal mood and appropriate affect.   Data Reviewed: I have personally reviewed following labs and imaging studies  CBC: Recent Labs  Lab 11/23/20 1859 11/24/20 1143 11/25/20 0334  WBC 12.8* 11.9* 9.6  NEUTROABS 9.6*  --  5.9  HGB 6.5* 8.5* 7.6*  HCT 20.9* 25.9* 24.1*  MCV 90.5 89.3 92.3  PLT 274 231 937   Basic Metabolic Panel: Recent Labs  Lab 11/23/20 1859 11/24/20 1143 11/25/20 0334  NA 140 146* 139  K 3.6 3.1* 3.4*  CL 110 118* 112*  CO2 20* 22 20*  GLUCOSE 138* 100* 92  BUN 65* 61* 41*  CREATININE 1.62* 1.53* 1.41*  CALCIUM 7.8* 8.0* 7.6*  MG  --  1.9 1.7  PHOS  --  2.4* 2.3*   GFR: Estimated Creatinine Clearance: 31.1 mL/min (A) (by C-G formula based on SCr of 1.41 mg/dL (H)). Liver Function Tests: Recent Labs  Lab 11/23/20 1859 11/25/20 0334  AST 20 16  ALT 18 13  ALKPHOS 58 50  BILITOT 0.9 0.9  PROT 6.1* 5.2*  ALBUMIN 2.9* 2.5*   Recent Labs  Lab 11/23/20 1859  LIPASE 28   No results for input(s): AMMONIA in the last 168 hours. Coagulation Profile: No results for input(s): INR, PROTIME in the last 168 hours. Cardiac Enzymes: No results for input(s): CKTOTAL, CKMB, CKMBINDEX, TROPONINI  in the last 168 hours. BNP (last 3 results) No results for input(s): PROBNP in the last 8760 hours. HbA1C: No results for input(s): HGBA1C in the last 72 hours. CBG: No results for input(s): GLUCAP in the last 168 hours. Lipid Profile: No results for input(s):  CHOL, HDL, LDLCALC, TRIG, CHOLHDL, LDLDIRECT in the last 72 hours. Thyroid Function Tests: No results for input(s): TSH, T4TOTAL, FREET4, T3FREE, THYROIDAB in the last 72 hours. Anemia Panel: No results for input(s): VITAMINB12, FOLATE, FERRITIN, TIBC, IRON, RETICCTPCT in the last 72 hours. Sepsis Labs: Recent Labs  Lab 11/23/20 1900 11/23/20 2125 11/24/20 0200 11/24/20 1143  LATICACIDVEN 2.8* 2.2* 1.8 1.0    Recent Results (from the past 240 hour(s))  SARS Coronavirus 2 by RT PCR (hospital order, performed in [place] hospital lab)     Status: None   Collection Time: 11/23/20  9:33 PM  Result Value Ref Range Status   SARS Coronavirus 2 NEGATIVE NEGATIVE Final    Comment: (NOTE) SARS-CoV-2 target nucleic acids are NOT DETECTED.  The SARS-CoV-2 RNA is generally detectable in upper and lower respiratory specimens during the acute phase of infection. The lowest concentration of SARS-CoV-2 viral copies this assay can detect is 250 copies / mL. A negative result does not preclude SARS-CoV-2 infection and should not be used as the sole basis for treatment or other patient management decisions.  A negative result may occur with improper specimen collection / handling, submission of specimen other than nasopharyngeal swab, presence of viral mutation(s) within the areas targeted by this assay, and inadequate number of viral copies (<250 copies / mL). A negative result must be combined with clinical observations, patient history, and epidemiological information.  Fact Sheet for Patients:   StrictlyIdeas.no  Fact Sheet for Healthcare Providers: BankingDealers.co.za  This test is not yet approved or  cleared by the [place] FDA and has been authorized for detection and/or diagnosis of SARS-CoV-2 by FDA under an Emergency Use Authorization (EUA).  This EUA will remain in effect (meaning this test can be used) for the duration of  the COVID-19 declaration under Section 564(b)(1) of the Act, 21 U.S.C. section 360bbb-3(b)(1), unless the authorization is terminated or revoked sooner.  Performed at [facility], [place] [address], [place], [place] [address]      RN Pressure Injury Documentation:     Estimated body mass index is 22.5 kg/m as calculated from the following:   Height as of this encounter: 5\' 8"  (1.727 m).   Weight as of this encounter: 67.1 kg.  Malnutrition Type:   Malnutrition Characteristics:   Nutrition Interventions:    Radiology Studies: CT CHEST W CONTRAST  Result Date: 11/24/2020 CLINICAL DATA:  Hematemesis, suspect large hiatal hernia EXAM: CT CHEST, ABDOMEN, AND PELVIS WITH CONTRAST TECHNIQUE: Multidetector CT imaging of the chest, abdomen and pelvis was performed following the standard protocol during bolus administration of intravenous contrast. CONTRAST:  56mL OMNIPAQUE IOHEXOL 300 MG/ML SOLN, additional oral enteric contrast COMPARISON:  None. FINDINGS: CT CHEST FINDINGS Cardiovascular: Aortic atherosclerosis. Normal heart size. Scattered coronary artery calcifications. No pericardial effusion. Mediastinum/Nodes: No enlarged mediastinal, hilar, or axillary lymph nodes. Large hiatal hernia with complete intrathoracic position of the stomach as well as portions of the pancreatic head and neck and multiple loops of nonobstructed small bowel. Thyroid gland, trachea, and esophagus demonstrate no significant findings. Lungs/Pleura: Trace bilateral pleural effusions. Compressive atelectasis of the right lower lobe associated with a large hiatal hernia. Musculoskeletal: No chest wall mass or  suspicious bone lesions identified. CT ABDOMEN PELVIS FINDINGS Hepatobiliary: No solid liver abnormality is seen. No gallstones, gallbladder wall thickening, or biliary dilatation. Pancreas: Unremarkable. No pancreatic ductal dilatation or surrounding inflammatory changes. Spleen: Normal in size  without significant abnormality. Adrenals/Urinary Tract: Adrenal glands are unremarkable. Severe bilateral hydronephrosis and hydroureter to the ureterovesicular junctions. There is severe urinary bladder wall thickening and distention of the urinary bladder. Stomach/Bowel: Stomach is within normal limits. Appendix appears normal. No evidence of bowel wall thickening, distention, or inflammatory changes. Vascular/Lymphatic: Aortic atherosclerosis. No enlarged abdominal or pelvic lymph nodes. Reproductive: TURP defect of the prostate. Other: There is a midline epigastric hernia containing multiple loops of nonobstructed small bowel (series 2, image 72). No abdominopelvic ascites. Musculoskeletal: No acute or significant osseous findings. IMPRESSION: 1. Large hiatal hernia with complete intrathoracic position of the stomach as well as portions of the pancreatic head and neck and multiple loops of nonobstructed small bowel. 2. Trace bilateral pleural effusions. Compressive atelectasis of the right lower lobe associated with a large hiatal hernia. 3. Severe bilateral hydronephrosis and hydroureter to the ureterovesicular junctions. There is severe urinary bladder wall thickening and distention of the urinary bladder. No obstructing calculi or other lesion identified. 4. TURP defect of the prostate. 5. There is a midline epigastric hernia containing multiple loops of nonobstructed small bowel. 6. Coronary artery disease. Aortic Atherosclerosis (ICD10-I70.0). Electronically Signed   By: Eddie Candle M.D.   On: 11/24/2020 23:27   CT ABDOMEN PELVIS W CONTRAST  Result Date: 11/24/2020 CLINICAL DATA:  Hematemesis, suspect large hiatal hernia EXAM: CT CHEST, ABDOMEN, AND PELVIS WITH CONTRAST TECHNIQUE: Multidetector CT imaging of the chest, abdomen and pelvis was performed following the standard protocol during bolus administration of intravenous contrast. CONTRAST:  73mL OMNIPAQUE IOHEXOL 300 MG/ML SOLN, additional oral  enteric contrast COMPARISON:  None. FINDINGS: CT CHEST FINDINGS Cardiovascular: Aortic atherosclerosis. Normal heart size. Scattered coronary artery calcifications. No pericardial effusion. Mediastinum/Nodes: No enlarged mediastinal, hilar, or axillary lymph nodes. Large hiatal hernia with complete intrathoracic position of the stomach as well as portions of the pancreatic head and neck and multiple loops of nonobstructed small bowel. Thyroid gland, trachea, and esophagus demonstrate no significant findings. Lungs/Pleura: Trace bilateral pleural effusions. Compressive atelectasis of the right lower lobe associated with a large hiatal hernia. Musculoskeletal: No chest wall mass or suspicious bone lesions identified. CT ABDOMEN PELVIS FINDINGS Hepatobiliary: No solid liver abnormality is seen. No gallstones, gallbladder wall thickening, or biliary dilatation. Pancreas: Unremarkable. No pancreatic ductal dilatation or surrounding inflammatory changes. Spleen: Normal in size without significant abnormality. Adrenals/Urinary Tract: Adrenal glands are unremarkable. Severe bilateral hydronephrosis and hydroureter to the ureterovesicular junctions. There is severe urinary bladder wall thickening and distention of the urinary bladder. Stomach/Bowel: Stomach is within normal limits. Appendix appears normal. No evidence of bowel wall thickening, distention, or inflammatory changes. Vascular/Lymphatic: Aortic atherosclerosis. No enlarged abdominal or pelvic lymph nodes. Reproductive: TURP defect of the prostate. Other: There is a midline epigastric hernia containing multiple loops of nonobstructed small bowel (series 2, image 72). No abdominopelvic ascites. Musculoskeletal: No acute or significant osseous findings. IMPRESSION: 1. Large hiatal hernia with complete intrathoracic position of the stomach as well as portions of the pancreatic head and neck and multiple loops of nonobstructed small bowel. 2. Trace bilateral pleural  effusions. Compressive atelectasis of the right lower lobe associated with a large hiatal hernia. 3. Severe bilateral hydronephrosis and hydroureter to the ureterovesicular junctions. There is severe urinary bladder wall thickening and distention of  the urinary bladder. No obstructing calculi or other lesion identified. 4. TURP defect of the prostate. 5. There is a midline epigastric hernia containing multiple loops of nonobstructed small bowel. 6. Coronary artery disease. Aortic Atherosclerosis (ICD10-I70.0). Electronically Signed   By: Eddie Candle M.D.   On: 11/24/2020 23:27    Scheduled Meds: . levothyroxine  25 mcg Oral Q0600  . pantoprazole (PROTONIX) IV  40 mg Intravenous Q12H  . QUEtiapine  25 mg Oral BID  . tamsulosin  0.4 mg Oral Daily  . vitamin B-12  500 mcg Oral Daily   Continuous Infusions: . dextrose 5 % with KCl 20 mEq / L 20 mEq (11/25/20 0558)    LOS: 2 days   Kerney Elbe, DO [facility] PAGER is on [place]  If 7PM-7AM, please contact night-coverage [email or web address]

## 2020-11-25 NOTE — Consult Note (Signed)
I have been asked to see the patient by Dr. Raiford Noble, for evaluation and management of bilateral hydronephrosis and urinary retention.  History of present illness: 87 yo man with medical history significant for cognitive impairment, chronic back pain, CKD stage III, hypothyroidism and GERD who presents with concerns of coffee-ground emesis.  Patient unable to provide history given cognitive impairment reportedly per documentation he was sent over from his SNF after staff noticed he had coffee-ground emesis.  Patient denies any pain and states that he feels fine.  States he only saw some dark substances in his vomitus but not sure that it was bloody.    GI and general surgery have been consulted.  Urology consulted after CT of the abdomen pelvis showed marked bilateral hydronephrosis and significantly distended bladder.  Patient is asymptomatic and denies any suprapubic pain.  He currently has a condom catheter on and is voiding into that without difficulty.  CT scan mentions a TURP defect.  Patient denies any prior surgery on his prostate.  In addition he denies gross hematuria, history of prostate cancer or any voiding difficulty.  He states he does not see a urologist.  Creatinine 1.41 which is actually improved since admission and appears to be near his baseline.       Review of systems: A 12 point comprehensive review of systems was obtained and is negative unless otherwise stated in the history of present illness.  Patient Active Problem List   Diagnosis Date Noted  . Incarcerated giant hiatal hernia 11/25/2020  . HOH (hard of hearing) 11/25/2020  . Scoliosis of thoracolumbar region due to degenerative disease of spine in adult 11/25/2020  . Right groin mass c/w sebaceous cyst 11/25/2020  . Cameron ulcer, acute with bleeding & anemia 11/25/2020  . IDA (iron deficiency anemia) 11/25/2020  . Uses roller walker 11/25/2020  . HTN (hypertension) 11/24/2020  . Hypothyroidism 11/24/2020  .  Cognitive impairment 11/24/2020  . Acute GI bleeding from [facility] ulcerations in giant hiatal hernia 11/23/2020  . Umbilical hernia - reducible 06/20/2019  . CKD (chronic kidney disease), stage III ([place]) 02/03/2019  . H/O sinus bradycardia 11/20/2017  . Senile osteoporosis 11/20/2017  . Other idiopathic scoliosis, thoracolumbar region 11/20/2017  . Continuous leakage of urine 11/20/2017  . Protein-calorie malnutrition, moderate ([place]) 02/28/2014  . Chronic low back pain 02/26/2014  . Elevated LFTs 02/26/2014  . Alcohol use 02/26/2014  . Generalized weakness 02/26/2014  . GERD (gastroesophageal reflux disease) 08/13/2011  . Lumbar disc disease 08/13/2011    No current facility-administered medications on file prior to encounter.   Current Outpatient Medications on File Prior to Encounter  Medication Sig Dispense Refill  . acetaminophen (TYLENOL) 325 MG tablet Take 650 mg by mouth 2 (two) times daily.     Marland Kitchen acetaminophen (TYLENOL) 325 MG tablet Take 650 mg by mouth 2 (two) times daily as needed.    Marland Kitchen amLODipine (NORVASC) 2.5 MG tablet Take 1 tablet by mouth daily.    Marland Kitchen antiseptic oral rinse (BIOTENE) LIQD 15 mLs by Mouth Rinse route 4 (four) times daily as needed for dry mouth.    . bisacodyl (DULCOLAX) 10 MG suppository Place 10 mg rectally as needed for moderate constipation.    Marland Kitchen levothyroxine (SYNTHROID) 25 MCG tablet Take 1 tablet (25 mcg total) by mouth daily before breakfast. 30 tablet 11  . Melatonin 5 MG CAPS Take 1 capsule (5 mg total) by mouth at bedtime. 30 capsule 11  . ondansetron (ZOFRAN) 4 MG tablet Take  4 mg by mouth every 6 (six) hours as needed for nausea or vomiting.    Vladimir Faster Glycol-Propyl Glycol (SYSTANE) 0.4-0.3 % SOLN Two drops both eyes three times daily as needed for dry itchy eyes.    Marland Kitchen QUEtiapine (SEROQUEL) 25 MG tablet Take 25 mg by mouth 2 (two) times daily.     Marland Kitchen triamcinolone cream (KENALOG) 0.1 % Apply 1 application topically as needed.    . vitamin  B-12 (CYANOCOBALAMIN) 1000 MCG tablet Take 500 mcg by mouth daily.     Marland Kitchen lactose free nutrition (BOOST) LIQD Take 237 mLs by mouth 2 (two) times daily between meals. (Patient not taking: Reported on 11/23/2020)    . loperamide (IMODIUM) 2 MG capsule Take 2 mg by mouth 3 (three) times daily as needed for diarrhea or loose stools. (Patient not taking: Reported on 11/23/2020)    . pantoprazole (PROTONIX) 40 MG tablet Take 1 tablet (40 mg total) by mouth daily before supper. 30 tablet 11    Past Medical History:  Diagnosis Date  . Acute kidney injury ([place]) 03/06/14  . Anemia, unspecified 03/06/14  . Arthritis   . BPH (benign prostatic hyperplasia)   . Cerebral atrophy ([place]) 03/06/14  . Cerebrovascular disease 03/06/14  . Degenerative disc disease, lumbar 03/06/14  . Diverticula, bladder acquired   . Edema 03/06/14  . GERD (gastroesophageal reflux disease)   . H/O: GI bleed 1986  . Hiatal hernia   . History of fall 03/06/14  . Hypercalcemia 03/06/14  . Hyperlipidemia   . Hypertension   . Impotence   . LFT elevation 03/06/14  . Lumbago 03/06/14  . Mallory-Weiss tear 1986  . Osteoporosis   . Rhabdomyolysis 03/06/14  . Scoliosis of cervical spine 03/06/14  . Scoliosis of lumbar spine 03/06/14  . Shingles   . Spermatocele    bilateral, left greater than right  . Troponin level elevated 03/06/14  . Weak 03/06/14    Past Surgical History:  Procedure Laterality Date  . BACK SURGERY  10/2009   ESI for surgery for lumbar spinal stenosis  . clubbed toe repair  2004  . COLONOSCOPY  08/15/2011  . INGUINAL HERNIA REPAIR Right 11/26/2004   Dr Rebekah [place] - open w mesh  . REPLACEMENT TOTAL KNEE BILATERAL  2007, 2008  . TONSILLECTOMY AND ADENOIDECTOMY  1939  . X-STOP IMPLANTATION      Social History   Tobacco Use  . Smoking status: Former Smoker    Types: Cigarettes  . Smokeless tobacco: Never Used  . Tobacco comment: quit in 1960s  Substance Use Topics  . Alcohol use: Yes    Alcohol/week: 14.0  standard drinks    Types: 14 Standard drinks or equivalent per week    Comment: couple of drinks of scotch every other day  . Drug use: No    Family History  Problem Relation Age of Onset  . Alcoholism Father   . Liver disease Father   . Colon cancer Neg Hx     PE: Vitals:   11/24/20 1240 11/24/20 1258 11/24/20 2133 11/25/20 0630  BP: (!) 130/41 128/68 132/64 116/66  Pulse: (!) 57 77 64 63  Resp: 14 16 18 18   Temp:  98.1 F (36.7 C) 98.5 F (36.9 C) 97.7 F (36.5 C)  TempSrc:  Oral Oral Oral  SpO2: 100% 100% 100% 97%   Patient appears to be in no acute distress  patient is alert and oriented x3 Atraumatic normocephalic head No increased work of breathing, no  audible wheezes/rhonchi Regular sinus rhythm/rate Abdomen is soft, nontender, nondistended, suprapubic tenderness Lower extremities are symmetric without appreciable edema No identifiable skin lesions  Recent Labs    11/23/20 1859 11/24/20 1143 11/25/20 0334  WBC 12.8* 11.9* 9.6  HGB 6.5* 8.5* 7.6*  HCT 20.9* 25.9* 24.1*   Recent Labs    11/23/20 1859 11/24/20 1143 11/25/20 0334  NA 140 146* 139  K 3.6 3.1* 3.4*  CL 110 118* 112*  CO2 20* 22 20*  GLUCOSE 138* 100* 92  BUN 65* 61* 41*  CREATININE 1.62* 1.53* 1.41*  CALCIUM 7.8* 8.0* 7.6*   No results for input(s): LABPT, INR in the last 72 hours. No results for input(s): LABURIN in the last 72 hours. Results for orders placed or performed during the hospital encounter of 11/23/20  SARS Coronavirus 2 by RT PCR (hospital order, performed in [place] hospital lab)     Status: None   Collection Time: 11/23/20  9:33 PM  Result Value Ref Range Status   SARS Coronavirus 2 NEGATIVE NEGATIVE Final    Comment: (NOTE) SARS-CoV-2 target nucleic acids are NOT DETECTED.  The SARS-CoV-2 RNA is generally detectable in upper and lower respiratory specimens during the acute phase of infection. The lowest concentration of SARS-CoV-2 viral copies this assay can  detect is 250 copies / mL. A negative result does not preclude SARS-CoV-2 infection and should not be used as the sole basis for treatment or other patient management decisions.  A negative result may occur with improper specimen collection / handling, submission of specimen other than nasopharyngeal swab, presence of viral mutation(s) within the areas targeted by this assay, and inadequate number of viral copies (<250 copies / mL). A negative result must be combined with clinical observations, patient history, and epidemiological information.  Fact Sheet for Patients:   StrictlyIdeas.no  Fact Sheet for Healthcare Providers: BankingDealers.co.za  This test is not yet approved or  cleared by the [place] FDA and has been authorized for detection and/or diagnosis of SARS-CoV-2 by FDA under an Emergency Use Authorization (EUA).  This EUA will remain in effect (meaning this test can be used) for the duration of the COVID-19 declaration under Section 564(b)(1) of the Act, 21 U.S.C. section 360bbb-3(b)(1), unless the authorization is terminated or revoked sooner.  Performed at [facility], [place] [address] [place], [place] [address]     Imaging: CT Abd/Pelvis 11/24/20 IMPRESSION: 1. Large hiatal hernia with complete intrathoracic position of the stomach as well as portions of the pancreatic head and neck and multiple loops of nonobstructed small bowel. 2. Trace bilateral pleural effusions. Compressive atelectasis of the right lower lobe associated with a large hiatal hernia. 3. Severe bilateral hydronephrosis and hydroureter to the ureterovesicular junctions. There is severe urinary bladder wall thickening and distention of the urinary bladder. No obstructing calculi or other lesion identified. 4. TURP defect of the prostate. 5. There is a midline epigastric hernia containing multiple loops of nonobstructed small  bowel. 6. Coronary artery disease.  Aortic Atherosclerosis (ICD10-I70.0).   Electronically Signed   By: Eddie Candle M.D.   On: 11/24/2020 23:27  Imp/Recommendations: 87 year old man with multiple medical problems who presented with nausea and vomiting found to have UGI bleed with hematemesis and anemia.  Incidental finding of bilateral marked hydronephrosis and bladder distention on CT.  1. Bilateral hydronephrosis 2. Urinary retention  -Discussed with patient placement of indwelling Foley catheter to help with decompression of hydronephrosis; however, patient is asymptomatic and kidney  function is at baseline -Patient is unsure whether or not he would like a Foley catheter and would like to discuss this with his attorney who is his healthcare POA -Unsure if placing Foley catheter will improve clinical status has no evidence of acute on chronic renal failure, infection or discomfort; would recommend observation for now    Thank you for involving me in this patient's care.  Please page with any further questions or concerns. Tayson Schnelle D Casy Tavano

## 2020-11-25 NOTE — Evaluation (Signed)
Occupational Therapy Evaluation Patient Details Name: [name] MRN: [id number] DOB: 30-Nov-1924 Today's Date: 11/25/2020    History of Present Illness 87yo male admitted with N/V/D, hiatal hernia, GI bleed. Hx of chronic back pain, OA, falls, shingles, mild memory deficits, BPH, CKD, osteoporosis   Clinical Impression   [name] is a 87 year old woman who presents with generalized weakness, decreased activity tolerance and impaired balance resulting in a decline in functional abilities. Patient exhibits with severe kyphotic posture but able to stand from recliner with min guard and ambulate with rolator. Patient needing increased assistance with ADLs due to reliance of hands on walker with standing during ADLs. Patient will benefit from skilled OT services while in hospital to improve deficits and learn compensatory strategies as needed in order to return to PLOF.  Patient wants to return to ALF at discharge but needing more assistance than typical and may benefit from short term rehab. If patient progresses while in hospital [facility] at ALF would be recommended.    Follow Up Recommendations  Home health OT;SNF    Equipment Recommendations  None recommended by OT    Recommendations for Other Services       Precautions / Restrictions Precautions Precautions: Fall Restrictions Weight Bearing Restrictions: No      Mobility Bed Mobility Overal bed mobility: Needs Assistance Bed Mobility: Supine to Sit     Supine to sit: Mod assist;HOB elevated     General bed mobility comments: OOB in recliner    Transfers Overall transfer level: Needs assistance Equipment used: 4-wheeled walker Transfers: Sit to/from Stand Sit to Stand: Min guard         General transfer comment: Min guard with increased time to stand from recliner x 2. Initially needed a couple attempts to come into standing. Severly kyphotic posture.    Balance Overall balance assessment: Needs  assistance Sitting-balance support: No upper extremity supported Sitting balance-Leahy Scale: Good     Standing balance support: Bilateral upper extremity supported Standing balance-Leahy Scale: Poor                             ADL either performed or assessed with clinical judgement   ADL Overall ADL's : Needs assistance/impaired Eating/Feeding: Independent   Grooming: Set up;Sitting   Upper Body Bathing: Set up;Sitting   Lower Body Bathing: Minimal assistance;Sit to/from stand;Set up   Upper Body Dressing : Set up;Sitting   Lower Body Dressing: Minimal assistance;Sit to/from stand   Toilet Transfer: Min guard;+2 for safety/equipment;BSC;Stand-pivot   Toileting- Water quality scientist and Hygiene: Moderate assistance;Sit to/from stand               Vision Patient Visual Report: No change from baseline       Perception     Praxis      Pertinent Vitals/Pain Pain Assessment: No/denies pain     Hand Dominance     Extremity/Trunk Assessment Upper Extremity Assessment Upper Extremity Assessment: Generalized weakness   Lower Extremity Assessment Lower Extremity Assessment: Defer to PT evaluation   Cervical / Trunk Assessment Cervical / Trunk Assessment: Kyphotic   Communication Communication Communication: No difficulties   Cognition Arousal/Alertness: Awake/alert Behavior During Therapy: WFL for tasks assessed/performed Overall Cognitive Status: Within Functional Limits for tasks assessed                                 General  Comments: mild memory deficits as reported in chart. follows commands well.   General Comments       Exercises     Shoulder Instructions      Home Living Family/patient expects to be discharged to:: Assisted living     Type of Home: Assisted living Home Access: Level entry     Home Layout: One level               Home Equipment: Walker - 4 wheels          Prior  Functioning/Environment Level of Independence: Needs assistance  Gait / Transfers Assistance Needed: uses rollator for ambulation ADL's / Homemaking Assistance Needed: able to perform BADLs.            OT Problem List: Decreased strength;Decreased activity tolerance;Impaired balance (sitting and/or standing);Decreased knowledge of use of DME or AE      OT Treatment/Interventions: Self-care/ADL training;Therapeutic exercise;DME and/or AE instruction;Therapeutic activities;Balance training;Patient/family education    OT Goals(Current goals can be found in the care plan section) Acute Rehab OT Goals Patient Stated Goal: to return to ALF OT Goal Formulation: With patient Time For Goal Achievement: 12/09/20 Potential to Achieve Goals: Good  OT Frequency: Min 2X/week   Barriers to D/C:            Co-evaluation PT/OT/SLP Co-Evaluation/Treatment: Yes Reason for Co-Treatment: For patient/therapist safety          AM-PAC OT "6 Clicks" Daily Activity     Outcome Measure Help from another person eating meals?: None Help from another person taking care of personal grooming?: A Little Help from another person toileting, which includes using toliet, bedpan, or urinal?: A Lot Help from another person bathing (including washing, rinsing, drying)?: A Little Help from another person to put on and taking off regular upper body clothing?: A Little Help from another person to put on and taking off regular lower body clothing?: A Lot 6 Click Score: 17   End of Session Equipment Utilized During Treatment: Gait belt;Other (comment) Copy) Nurse Communication: Mobility status  Activity Tolerance: Patient tolerated treatment well Patient left: in chair;with call bell/phone within reach;with chair alarm set;with family/visitor present  OT Visit Diagnosis: Unsteadiness on feet (R26.81);Muscle weakness (generalized) (M62.81)                Time: 2355-7322 OT Time Calculation (min): 15  min Charges:  OT General Charges $OT Visit: 1 Visit OT Evaluation $OT Eval Low Complexity: 1 Low  Shanekqua Schaper, OTR/L [place]  Office [phone] Pager: [place] 11/25/2020, 3:32 PM

## 2020-11-25 NOTE — Evaluation (Signed)
Physical Therapy Evaluation Patient Details Name: [name] MRN: [id number] DOB: [birth year] Today's Date: 11/25/2020   History of Present Illness  87yo male admitted with N/V/D, hiatal hernia, GI bleed. Hx of chronic back pain, OA, falls, shingles, mild memory deficits, BPH, CKD, osteoporosis  Clinical Impression  On eval, pt required Mod assist for mobility. He walked ~6 feet with a RW. Very unsteady with ambulation-at risk for falls. He is eager to mobilize and he wants to avoid losing his strength while hospitalized. Pt presents with general weakness, decreased activity tolerance, and impaired gait and balance. Will plan to follow and progress activity as tolerated. Unsure of d/c plan at this time-will depend on medical course. Will update recommendations as able.     Follow Up Recommendations SNF (vs return to ALF section (depending on progress/medical course)    Equipment Recommendations  None recommended by PT    Recommendations for Other Services       Precautions / Restrictions Precautions Precautions: Fall Restrictions Weight Bearing Restrictions: No      Mobility  Bed Mobility Overal bed mobility: Needs Assistance Bed Mobility: Supine to Sit     Supine to sit: Mod assist;HOB elevated     General bed mobility comments: Assist to scoot to EOB-utilized bedpad. Increased time. Stiff.     Transfers Overall transfer level: Needs assistance Equipment used: Rolling walker (2 wheeled) Transfers: Sit to/from Stand Sit to Stand: Mod assist;From elevated surface         General transfer comment: Assist to power up, stabilize. Cues for safety, posture and for pt to make sure he is steady before taking any steps.  Ambulation/Gait Ambulation/Gait assistance: Min assist Gait Distance (Feet): 6 Feet Assistive device: Rolling walker (2 wheeled) Gait Pattern/deviations: Step-through pattern;Decreased stride length;Trunk flexed;Decreased step length - left;Decreased step  length - right     General Gait Details: Assist to steady pt and follow closely with recliner. Unsteady and poor use of standard RW (pt normally uses a rollator). Cues for safety, posture, RW proximity. Fall risk. Distance limited by bladder incontinence-condom cath came off during session-pt leaking urine.   Stairs            Wheelchair Mobility    Modified Rankin (Stroke Patients Only)       Balance Overall balance assessment: Needs assistance;History of Falls         Standing balance support: Bilateral upper extremity supported Standing balance-Leahy Scale: Poor                               Pertinent Vitals/Pain Pain Assessment: No/denies pain    Home Living Family/patient expects to be discharged to:: Assisted living     Type of Home: Assisted living Home Access: Level entry     Home Layout: One level Home Equipment: Walker - 4 wheels      Prior Function Level of Independence: Needs assistance   Gait / Transfers Assistance Needed: uses rollator for ambulation           Hand Dominance        Extremity/Trunk Assessment   Upper Extremity Assessment Upper Extremity Assessment: Defer to OT evaluation    Lower Extremity Assessment Lower Extremity Assessment: Generalized weakness    Cervical / Trunk Assessment Cervical / Trunk Assessment: Kyphotic  Communication   Communication: No difficulties  Cognition Arousal/Alertness: Awake/alert Behavior During Therapy: WFL for tasks assessed/performed Overall Cognitive Status: Within Functional Limits for tasks  assessed                                 General Comments: mild memory deficits as reported in chart. follows commands well.      General Comments      Exercises     Assessment/Plan    PT Assessment Patient needs continued PT services  PT Problem List Decreased strength;Decreased mobility;Decreased activity tolerance;Decreased balance;Decreased knowledge of  use of DME;Decreased safety awareness       PT Treatment Interventions DME instruction;Gait training;Therapeutic exercise;Balance training;Functional mobility training;Therapeutic activities;Patient/family education    PT Goals (Current goals can be found in the Care Plan section)  Acute Rehab PT Goals Patient Stated Goal: to avoid losing his strength PT Goal Formulation: With patient Time For Goal Achievement: 12/09/20 Potential to Achieve Goals: Good    Frequency Min 3X/week   Barriers to discharge        Co-evaluation               AM-PAC PT "6 Clicks" Mobility  Outcome Measure Help needed turning from your back to your side while in a flat bed without using bedrails?: A Little Help needed moving from lying on your back to sitting on the side of a flat bed without using bedrails?: A Lot Help needed moving to and from a bed to a chair (including a wheelchair)?: A Little Help needed standing up from a chair using your arms (e.g., wheelchair or bedside chair)?: A Lot Help needed to walk in hospital room?: A Lot Help needed climbing 3-5 steps with a railing? : A Lot 6 Click Score: 14    End of Session Equipment Utilized During Treatment: Gait belt Activity Tolerance: Patient tolerated treatment well Patient left: in chair;with call bell/phone within reach;with chair alarm set   PT Visit Diagnosis: Muscle weakness (generalized) (M62.81);Difficulty in walking, not elsewhere classified (R26.2);History of falling (Z91.81)    Time: 5883-2549 PT Time Calculation (min) (ACUTE ONLY): 24 min   Charges:   PT Evaluation $PT Eval Moderate Complexity: 1 Mod PT Treatments $Gait Training: 8-22 mins           Doreatha Massed, PT Acute Rehabilitation  Office: [phone] Pager: [phone]

## 2020-11-25 NOTE — Progress Notes (Signed)
Physical Therapy Treatment Patient Details Name: [name] MRN: [id number] DOB: 1925/06/01 Today's Date: 11/25/2020    History of Present Illness 87yo male admitted with N/V/D, hiatal hernia, GI bleed. Hx of chronic back pain, OA, falls, shingles, mild memory deficits, BPH, CKD, osteoporosis    PT Comments    Mild improvement with ambulation but pt remains unsteady. Will continue to follow and progress activity as tolerated.   Follow Up Recommendations  SNF (vs return to ALF section-depending on progress/medical course. Pt prefers to return to ALF)     Equipment Recommendations  None recommended by PT    Recommendations for Other Services       Precautions / Restrictions Precautions Precautions: Fall Restrictions Weight Bearing Restrictions: No    Mobility  Bed Mobility Overal bed mobility: Needs Assistance Bed Mobility: Supine to Sit     Supine to sit: Mod assist;HOB elevated     General bed mobility comments: OOB in recliner    Transfers Overall transfer level: Needs assistance Equipment used: 4-wheeled walker Transfers: Sit to/from Stand Sit to Stand: Min assist         General transfer comment: Increased time and some steadying assistance. Cues for safety, hand placement, safe operation of rollator (pt stated he uses a rollator at ALF)  Ambulation/Gait Ambulation/Gait assistance: Min assist Gait Distance (Feet): 15 Feet (10'x1, 15'x1) Assistive device: Rolling walker (2 wheeled) Gait Pattern/deviations: Decreased stride length;Trunk flexed;Decreased step length - left;Decreased step length - right;Step-to pattern     General Gait Details: Step to pattern with R LE tending to drag/lag behind. Unsteady. Cues for safety, RW proximity, step length, and posture. Distance limited by bladder incontinence   Stairs             Wheelchair Mobility    Modified Rankin (Stroke Patients Only)       Balance Overall balance assessment: Needs  assistance Sitting-balance support: No upper extremity supported Sitting balance-Leahy Scale: Good     Standing balance support: Bilateral upper extremity supported Standing balance-Leahy Scale: Poor                              Cognition Arousal/Alertness: Awake/alert Behavior During Therapy: WFL for tasks assessed/performed Overall Cognitive Status: Within Functional Limits for tasks assessed                                 General Comments: mild memory deficits as reported in chart. follows commands well.      Exercises      General Comments        Pertinent Vitals/Pain Pain Assessment: Faces Faces Pain Scale: Hurts little more Pain Location: back Pain Descriptors / Indicators: Discomfort;Sore Pain Intervention(s): Monitored during session    Home Living Family/patient expects to be discharged to:: Assisted living     Type of Home: Assisted living Home Access: Level entry   Home Layout: One level Home Equipment: Walker - 4 wheels      Prior Function Level of Independence: Needs assistance  Gait / Transfers Assistance Needed: uses rollator for ambulation ADL's / Homemaking Assistance Needed: able to perform BADLs.     PT Goals (current goals can now be found in the care plan section) Acute Rehab PT Goals Patient Stated Goal: to return to ALF PT Goal Formulation: With patient Time For Goal Achievement: 12/09/20 Potential to Achieve Goals: Good Progress towards PT  goals: Progressing toward goals    Frequency    Min 3X/week      PT Plan Current plan remains appropriate    Co-evaluation   Reason for Co-Treatment: For patient/therapist safety          AM-PAC PT "6 Clicks" Mobility   Outcome Measure  Help needed turning from your back to your side while in a flat bed without using bedrails?: A Little Help needed moving from lying on your back to sitting on the side of a flat bed without using bedrails?: A Lot Help  needed moving to and from a bed to a chair (including a wheelchair)?: A Little Help needed standing up from a chair using your arms (e.g., wheelchair or bedside chair)?: A Little Help needed to walk in hospital room?: A Lot Help needed climbing 3-5 steps with a railing? : A Lot 6 Click Score: 15    End of Session Equipment Utilized During Treatment: Gait belt Activity Tolerance: Patient tolerated treatment well Patient left: in chair;with call bell/phone within reach;with chair alarm set;with family/visitor present   PT Visit Diagnosis: Muscle weakness (generalized) (M62.81);Difficulty in walking, not elsewhere classified (R26.2);History of falling (Z91.81)     Time: 4696-2952 PT Time Calculation (min) (ACUTE ONLY): 24 min  Charges:  $Gait Training: 8-22 mins                         Doreatha Massed, PT Acute Rehabilitation  Office: [phone] Pager: [phone]

## 2020-11-25 NOTE — Progress Notes (Signed)
Pt and HCPOA are both agreeable to foley cath placement, both acknowledged understanding of medical reason for placement. Dr. Alfredia Ferguson returned to speak with both to explain plan of care with patient and HCPOA. SRP, RN

## 2020-11-25 NOTE — Plan of Care (Signed)
  Problem: Education: Goal: Knowledge of risk factors and measures for prevention of condition will improve Outcome: Progressing   Problem: Coping: Goal: Psychosocial and spiritual needs will be supported Outcome: Progressing   Problem: Respiratory: Goal: Will maintain a patent airway Outcome: Progressing   

## 2020-11-25 NOTE — Consult Note (Addendum)
[name]  [birth year] [id number]  CARE TEAM:  PCP: Gayland Curry, DO  Outpatient Care Team: Patient Care Team: Gayland Curry, DO as PCP - General (Geriatric Medicine)  Inpatient Treatment Team: Treatment Team: Attending Provider: Kerney Elbe, DO; Rounding Team: Redmond Baseman, MD; Technician: Lucila [place], [place]; Consulting Physician: Milus Banister, MD; Technician: Marca Ancona, [place]; Registered Nurse: Abigail Butts, RN; Occupational Therapist: Lenward Chancellor, OT; Physical Therapist: Alphonzo Severance, PT; Janeece Riggers: Simeon Craft; Registered Nurse: Zadie Rhine, RN; Consulting Physician: Edison Pace, Md, MD   This patient is a 87 y.o.male who presents today for surgical evaluation at the request of Dr Loletha Carrow, [facility].   Chief complaint / Reason for evaluation: Very large hiatal hernia chronically incarcerated with [name] ulcerations and symptomatic anemia.  Consider patient in surgical repair  Pleasant elderly gentleman.  Lives at [facility] assisted [facility].  Some significant lumbar sacral skin scoliosis.  Somewhat ambulatory and can walk in hallways okay with assistance of a walker.  Some mild cognitive impairment, kidney disease, hypothyroidism.  Chronic GERD.  Had episode of coffee-ground emesis with nausea vomiting and a regular loose bowels.  And was found to have a hemoglobin of 6.5.  Usually he is mildly anemic with a hemoglobin around 11.  Elevated creatinine.  Based concerns he was admitted 2 days ago and stabilized.  He had upper endoscopy yesterday which showed a significant gastric ulcerations and a probable large hiatal hernia.  Stabilized and transfused.  CT scan reveals large hiatal hernia going into his right chest with pancreatic head going up and as well.  Based concerns gastroenterology requested surgical evaluation.  Patient is not the most reliable historian but denies any major dysphagia to solids or liquids.  Denies any major heartburn but I  believe he is on chronic acid blockade.  Not on any blood thinners.  Has had prior colonoscopies every 10 years which have been underwhelming.  The last 1 in 2012.  There are some evidence of him having a Mallory-Weiss tear from retching in the 1980s but records are not available.  Family is not available.  Consultation requested by Dr. Loletha Carrow who is coming to visit the patient so I followed along to help evaluate.   Assessment  [name]  87 y.o. male  1 Day Post-Op  Procedure(s): ESOPHAGOGASTRODUODENOSCOPY (EGD) WITH PROPOFOL BIOPSY  Problem List:  Principal Problem:   Acute GI bleeding from [facility] ulcerations in giant hiatal hernia Active Problems:   Incarcerated giant hiatal hernia   CKD (chronic kidney disease), stage III ([place])   Umbilical hernia - reducible   HTN (hypertension)   Hypothyroidism   Cognitive impairment   HOH (hard of hearing)   Scoliosis of thoracolumbar region due to degenerative disease of spine in adult   Right groin mass c/w sebaceous cyst   [name] ulcer, acute with bleeding & anemia   IDA (iron deficiency anemia)   Uses roller walker   Rather large hiatal hernia with chronic incarceration and 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  Periumbilical hernia reducible.  Plan:  Standard of care would be consider reduction and repair of this large hiatal hernia.  Reasonable minimally since approach help get the stomach out the chest with repair and possible partial fundoplication.  Given his his very advanced age with some deconditioning and probable chronic malnutrition, will want medical and cardiac clearance to get assess of his risks are rather elevated.  However he had a major bleed with severely symptomatic anemia.  I worry this will happen more more and he will dwindle if this is not corrected.  Needs insight from family.  My instinct is if we do surgery, he would benefit from feeding gastrostomy  tube perioperatively since I suspect he has some dysphagia issues given his advanced age and being hard of hearing usually a sign of dysphagia issues as well.  Dr. Rosendo Gros to come on tomorrow who has expertise in foregut surgery as well and can help assess with the primary service and gastroenterology.  Needs medical and cardiac clearance to assess risks.  Palliative care consult for goals of care - hospice a very reasonable option.  Patient not very motivated for surgery & wants to discuss with his lawyer 1st  Would need input from family as well.  Patient actually recalls and wanted to discuss with his lawyer first.  Would need to make sure that there is a feeling that the patient has understanding and can make judgment since there is some question of cognitive issues as well.  He does have an umbilical hernia.  There is some evidence of bowel within it but is soft and reducible today.  He had a prior open right inguinal hernia repair in the past with no evidence of recurrence.    There is a nodule in his right lateral groin that is consistent with a large blackhead sebaceous cyst that is mobile and noninflamed.  Agree with PPI acid blockade.  Follow hemoglobin and transfuse as needed.    VTE prophylaxis- SCDs, etc -mobilize as tolerated to help recovery  40 minutes spent in review, evaluation, examination, counseling, and coordination of care.  More than 50% of that time was spent in counseling.  Gregory Hector, MD, FACS, MASCRS  Esophageal, Gastrointestinal & Colorectal Surgery Robotic and Minimally Invasive Surgery [facility] [address] [place], [place] [address] [phone] Fax [phone] Main/Paging  CONTACT INFORMATION: Weekday (9AM-5PM) concerns: Call [facility] main office at [phone] Weeknight (5PM-9AM) or Weekend/Holiday concerns: Check [email or web address] for General Surgery [facility] coverage (Please, do not use SecureChat as it is not reliable  communication to operating surgeons for immediate patient care)      11/25/2020      Past Medical History:  Diagnosis Date  . Acute kidney injury ([place]) 03/06/14  . Anemia, unspecified 03/06/14  . Arthritis   . BPH (benign prostatic hyperplasia)   . Cerebral atrophy ([place]) 03/06/14  . Cerebrovascular disease 03/06/14  . Degenerative disc disease, lumbar 03/06/14  . Diverticula, bladder acquired   . Edema 03/06/14  . GERD (gastroesophageal reflux disease)   . H/O: GI bleed 1986  . Hiatal hernia   . History of fall 03/06/14  . Hypercalcemia 03/06/14  . Hyperlipidemia   . Hypertension   . Impotence   . LFT elevation 03/06/14  . Lumbago 03/06/14  . Mallory-Weiss tear 1986  . Osteoporosis   . Rhabdomyolysis 03/06/14  . Scoliosis of cervical spine 03/06/14  . Scoliosis of lumbar spine 03/06/14  . Shingles   . Spermatocele    bilateral, left greater than right  . Troponin level elevated 03/06/14  . Weak 03/06/14    Past Surgical History:  Procedure Laterality Date  . BACK SURGERY  10/2009   ESI for surgery for lumbar spinal stenosis  . clubbed toe repair  2004  . COLONOSCOPY  08/15/2011  . INGUINAL HERNIA REPAIR Right 2006  . REPLACEMENT TOTAL KNEE  BILATERAL  2007, 2008  . TONSILLECTOMY AND ADENOIDECTOMY  1939  . X-STOP IMPLANTATION      Social History   Socioeconomic History  . Marital status: Divorced    Spouse name: Not on file  . Number of children: 2  . Years of education: Not on file  . Highest education level: Not on file  Occupational History  . Occupation: retired  Tobacco Use  . Smoking status: Former Smoker    Types: Cigarettes  . Smokeless tobacco: Never Used  . Tobacco comment: quit in 1960s  Substance and Sexual Activity  . Alcohol use: Yes    Alcohol/week: 14.0 standard drinks    Types: 14 Standard drinks or equivalent per week    Comment: couple of drinks of scotch every other day  . Drug use: No  . Sexual activity: Not on file  Other Topics  Concern  . Not on file  Social History Narrative   Tobacco use, amount per day now:  NONE   Past tobacco use, amount per day: 1 PACK PER DAY   How many years did you use tobacco: QUIT AGE 31/ 17 YEARS   Alcohol use (drinks per week): 3OZ DAILY/ 2 4OZ GLASSES OF WINE   Diet: REGULAR   Do you drink/eat things with caffeine:   Marital status: DIVORCED              What year were you married?   Do you live in a house, apartment, assisted living, condo, trailer, etc.? [place]    Is it one or more stories?  ONE STORY   How many persons live in your home? 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  WALK                                Type and how often?   Do you have a living will? YES   Do you have a DNR form?     NO                              If not, do you want to discuss one?   Do you have signed POA/HPOA forms?   YES                     If so, please bring to you appointment   Social Determinants of Health   Financial Resource Strain: Not on file  Food Insecurity: Not on file  Transportation Needs: Not on file  Physical Activity: Not on file  Stress: Not on file  Social Connections: Not on file  Intimate Partner Violence: Not on file    Family History  Problem Relation Age of Onset  . Alcoholism Father   . Liver disease Father   . Colon cancer Neg Hx     Current Facility-Administered Medications  Medication Dose Route Frequency Provider Last Rate Last Admin  . dextrose 5 % with KCl 20 mEq / L  infusion  20 mEq Intravenous Continuous Raiford Noble Cambria, DO 75 mL/hr at 11/25/20 0558 20 mEq at 11/25/20 0558  . levothyroxine (SYNTHROID) tablet 25 mcg  25 mcg Oral Q0600 Milus Banister, MD   25 mcg at 11/25/20 0555  . pantoprazole (PROTONIX) injection  40 mg  40 mg Intravenous Q12H Milus Banister, MD   40 mg at 11/24/20 2145  . QUEtiapine  (SEROQUEL) tablet 25 mg  25 mg Oral BID Milus Banister, MD   25 mg at 11/24/20 2144  . vitamin B-12 (CYANOCOBALAMIN) tablet 500 mcg  500 mcg Oral Daily Milus Banister, MD         Allergies  Allergen Reactions  . Amrix [Cyclobenzaprine]   . Shellfish Allergy     ROS:   All other systems reviewed & are negative except per HPI or as noted below: Constitutional:  No fevers, chills, sweats.  Weight stable Eyes:  No vision changes, No discharge HENT:  No sore throats, nasal drainage Lymph: No neck swelling, No bruising easily Pulmonary:  No cough, productive sputum CV: No orthopnea, PND  Patient walks 5 minutes with a walker.  No exertional chest/neck/shoulder/arm pain. GI:  No personal nor family history of GI/colon cancer, inflammatory bowel disease, irritable bowel syndrome, allergy such as Celiac Sprue, dietary/dairy problems, colitis.  No recent sick contacts/gastroenteritis.  No travel outside the country.  No changes in diet. + Coffee-ground emesis and melena Renal: No UTIs, No hematuria Genital:  No drainage, bleeding, masses Musculoskeletal: No severe joint pain.  Good ROM major joints Skin:  No sores or lesions.  No rashes Heme/Lymph:  No easy bleeding.  No swollen lymph nodes Neuro: No focal weakness/numbness.  No seizures Psych: No suicidal ideation.  No hallucinations.  Somewhat tangential with memory issues  BP 116/66 (BP Location: Right Arm)   Pulse 63   Temp 97.7 F (36.5 C) (Oral)   Resp 18   SpO2 97%   Physical Exam: Constitutional: Thin Not cachectic.  Hygeine adequate.  Vitals signs as above.   Eyes: Pupils reactive, normal extraocular movements. Sclera nonicteric Neuro: CN II-XII intact.  No major focal sensory defects.  No major motor deficits. Lymph: No head/neck/groin lymphadenopathy Psych:  No severe agitation.  No severe anxiety.  Judgment & insight Impaired, Oriented x3,  Memory fair. HENT: Normocephalic, Mucus membranes moist.  No thrush.   Neck:  Supple, No tracheal deviation.  No obvious thyromegaly Chest: No pain to chest wall compression.  Good respiratory excursion.  No audible wheezing CV:  Pulses intact.  Regular rhythm.  No major extremity edema  Abdomen:  Somewhat firm at first but softens.  Nondistended.  Nontender  2cm umbilical hernia reducible.  No incarcerated hernias.  No hepatomegaly.  No splenomegaly  Gen: Prior open right inguinal hernia repair without recurrence.  3 cm subcutaneous mobile nodule with blackhead consistent with chronic cyst.  Very right lateral groin/anterior thigh.  No inguinal hernias.  No inguinal lymphadenopathy.    Ext: No obvious deformity or contracture no significant edema.  No cyanosis Skin: No major subcutaneous nodules.  Warm and dry Musculoskeletal: Significant spinal scoliosis.  Severe joint rigidity not present.  No obvious clubbing.  No digital petechiae.     Results:   Labs: Results for orders placed or performed during the hospital encounter of 11/23/20 (from the past 48 hour(s))  Comprehensive metabolic panel     Status: Abnormal   Collection Time: 11/23/20  6:59 PM  Result Value Ref Range   Sodium 140 135 - 145 mmol/L   Potassium 3.6 3.5 - 5.1 mmol/L   Chloride 110 98 - 111 mmol/L   CO2 20 (L) 22 - 32 mmol/L   Glucose, Bld 138 (H) 70 - 99 mg/dL    Comment: Glucose reference  range applies only to samples taken after fasting for at least 8 hours.   BUN 65 (H) 8 - 23 mg/dL   Creatinine, Ser 1.62 (H) 0.61 - 1.24 mg/dL   Calcium 7.8 (L) 8.9 - 10.3 mg/dL   Total Protein 6.1 (L) 6.5 - 8.1 g/dL   Albumin 2.9 (L) 3.5 - 5.0 g/dL   AST 20 15 - 41 U/L   ALT 18 0 - 44 U/L   Alkaline Phosphatase 58 38 - 126 U/L   Total Bilirubin 0.9 0.3 - 1.2 mg/dL   GFR, Estimated 39 (L) >60 mL/min    Comment: (NOTE) Calculated using the CKD-EPI Creatinine Equation (2021)    Anion gap 10 5 - 15    Comment: Performed at [facility], [place] [address], [place], [place] [address]   Lipase, blood     Status: None   Collection Time: 11/23/20  6:59 PM  Result Value Ref Range   Lipase 28 11 - 51 U/L    Comment: Performed at [facility], [place] [address] [place], [place] [address]  CBC with Differential     Status: Abnormal   Collection Time: 11/23/20  6:59 PM  Result Value Ref Range   WBC 12.8 (H) 4.0 - 10.5 K/uL   RBC 2.31 (L) 4.22 - 5.81 MIL/uL   Hemoglobin 6.5 (LL) 13.0 - 17.0 g/dL    Comment: REPEATED TO VERIFY THIS CRITICAL RESULT HAS VERIFIED AND BEEN CALLED TO J.NASH, RN BY NATHAN THOMPSON ON 04 08 2022 AT 1928, AND HAS BEEN READ BACK. CRITICAL RESULT VERIFIED    HCT 20.9 (L) 39.0 - 52.0 %   MCV 90.5 80.0 - 100.0 fL   MCH 28.1 26.0 - 34.0 pg   MCHC 31.1 30.0 - 36.0 g/dL   RDW 15.4 11.5 - 15.5 %   Platelets 274 150 - 400 K/uL   nRBC 0.0 0.0 - 0.2 %   Neutrophils Relative % 76 %   Neutro Abs 9.6 (H) 1.7 - 7.7 K/uL   Lymphocytes Relative 13 %   Lymphs Abs 1.7 0.7 - 4.0 K/uL   Monocytes Relative 11 %   Monocytes Absolute 1.5 (H) 0.1 - 1.0 K/uL   Eosinophils Relative 0 %   Eosinophils Absolute 0.0 0.0 - 0.5 K/uL   Basophils Relative 0 %   Basophils Absolute 0.0 0.0 - 0.1 K/uL   Immature Granulocytes 0 %   Abs Immature Granulocytes 0.05 0.00 - 0.07 K/uL    Comment: Performed at [facility], [place] [address] [place], [place] [address]  Lactic acid, plasma     Status: Abnormal   Collection Time: 11/23/20  7:00 PM  Result Value Ref Range   Lactic Acid, Venous 2.8 (HH) 0.5 - 1.9 mmol/L    Comment: CRITICAL RESULT CALLED TO, READ BACK BY AND VERIFIED WITH: Lupita Dawn RN 11/23/20 @1945  BY P.HENDERSON Performed at [place] [address], [place], [place] [address]   Type and screen [place]     Status: None (Preliminary result)   Collection Time: 11/23/20  7:50 PM  Result Value Ref Range   ABO/RH(D) O POS    Antibody Screen NEG    Sample Expiration 11/26/2020,2359     Unit Number [id number]    Blood Component Type RED CELLS,LR    Unit division 00    Status of Unit ISSUED    Transfusion Status OK TO TRANSFUSE    Crossmatch Result  Compatible Performed at [facility], [place] [address] [place], [place] [address]    Unit Number [id number]    Blood Component Type RED CELLS,LR    Unit division 00    Status of Unit ISSUED    Transfusion Status OK TO TRANSFUSE    Crossmatch Result Compatible   POC occult blood, ED     Status: Abnormal   Collection Time: 11/23/20  9:22 PM  Result Value Ref Range   Fecal Occult Bld POSITIVE (A) NEGATIVE  Lactic acid, plasma     Status: Abnormal   Collection Time: 11/23/20  9:25 PM  Result Value Ref Range   Lactic Acid, Venous 2.2 (HH) 0.5 - 1.9 mmol/L    Comment: CRITICAL RESULT CALLED TO, READ BACK BY AND VERIFIED WITH: SARA DOSTER RN 11/23/20 @1024  BY P.HENDERSON Performed at [facility], [place] [address], [place], [place] [address]   SARS Coronavirus 2 by RT PCR (hospital order, performed in [place] hospital lab)     Status: None   Collection Time: 11/23/20  9:33 PM  Result Value Ref Range   SARS Coronavirus 2 NEGATIVE NEGATIVE    Comment: (NOTE) SARS-CoV-2 target nucleic acids are NOT DETECTED.  The SARS-CoV-2 RNA is generally detectable in upper and lower respiratory specimens during the acute phase of infection. The lowest concentration of SARS-CoV-2 viral copies this assay can detect is 250 copies / mL. A negative result does not preclude SARS-CoV-2 infection and should not be used as the sole basis for treatment or other patient management decisions.  A negative result may occur with improper specimen collection / handling, submission of specimen other than nasopharyngeal swab, presence of viral mutation(s) within the areas targeted by this assay, and inadequate number of viral copies (<250 copies / mL). A negative result must be combined with  clinical observations, patient history, and epidemiological information.  Fact Sheet for Patients:   StrictlyIdeas.no  Fact Sheet for Healthcare Providers: BankingDealers.co.za  This test is not yet approved or  cleared by the [place] FDA and has been authorized for detection and/or diagnosis of SARS-CoV-2 by FDA under an Emergency Use Authorization (EUA).  This EUA will remain in effect (meaning this test can be used) for the duration of the COVID-19 declaration under Section 564(b)(1) of the Act, 21 U.S.C. section 360bbb-3(b)(1), unless the authorization is terminated or revoked sooner.  Performed at [facility], [place] [address], [place], [place] [address]   Prepare RBC (crossmatch)     Status: None   Collection Time: 11/23/20  9:34 PM  Result Value Ref Range   Order Confirmation      ORDER PROCESSED BY BLOOD BANK Performed at [facility], [place] [address], [place], [place] [address]   Lactic acid, plasma     Status: None   Collection Time: 11/24/20  2:00 AM  Result Value Ref Range   Lactic Acid, Venous 1.8 0.5 - 1.9 mmol/L    Comment: Performed at [facility], [place] [address] [place], [place] [address]  Urinalysis, Routine w reflex microscopic     Status: None   Collection Time: 11/24/20  3:58 AM  Result Value Ref Range   Color, Urine YELLOW YELLOW   APPearance CLEAR CLEAR   Specific Gravity, Urine 1.016 1.005 - 1.030   pH 6.0 5.0 - 8.0   Glucose, UA NEGATIVE NEGATIVE mg/dL   Hgb urine dipstick NEGATIVE NEGATIVE   Bilirubin Urine NEGATIVE NEGATIVE  Ketones, ur NEGATIVE NEGATIVE mg/dL   Protein, ur NEGATIVE NEGATIVE mg/dL   Nitrite NEGATIVE NEGATIVE   Leukocytes,Ua NEGATIVE NEGATIVE    Comment: Performed at [place] [address] [place], [place] [address]  Basic metabolic panel     Status: Abnormal   Collection Time: 11/24/20 11:43  AM  Result Value Ref Range   Sodium 146 (H) 135 - 145 mmol/L   Potassium 3.1 (L) 3.5 - 5.1 mmol/L   Chloride 118 (H) 98 - 111 mmol/L   CO2 22 22 - 32 mmol/L   Glucose, Bld 100 (H) 70 - 99 mg/dL    Comment: Glucose reference range applies only to samples taken after fasting for at least 8 hours.   BUN 61 (H) 8 - 23 mg/dL   Creatinine, Ser 1.53 (H) 0.61 - 1.24 mg/dL   Calcium 8.0 (L) 8.9 - 10.3 mg/dL   GFR, Estimated 42 (L) >60 mL/min    Comment: (NOTE) Calculated using the CKD-EPI Creatinine Equation (2021)    Anion gap 6 5 - 15    Comment: Performed at [facility], [place] [address] [place], [place] [address]  CBC     Status: Abnormal   Collection Time: 11/24/20 11:43 AM  Result Value Ref Range   WBC 11.9 (H) 4.0 - 10.5 K/uL   RBC 2.90 (L) 4.22 - 5.81 MIL/uL   Hemoglobin 8.5 (L) 13.0 - 17.0 g/dL    Comment: REPEATED TO VERIFY POST TRANSFUSION SPECIMEN DELTA CHECK NOTED    HCT 25.9 (L) 39.0 - 52.0 %   MCV 89.3 80.0 - 100.0 fL   MCH 29.3 26.0 - 34.0 pg   MCHC 32.8 30.0 - 36.0 g/dL   RDW 15.3 11.5 - 15.5 %   Platelets 231 150 - 400 K/uL   nRBC 0.0 0.0 - 0.2 %    Comment: Performed at [name], [place] [address], [place], [place] [address]  Lactic acid, plasma     Status: None   Collection Time: 11/24/20 11:43 AM  Result Value Ref Range   Lactic Acid, Venous 1.0 0.5 - 1.9 mmol/L    Comment: Performed at [facility], [place] [address], [place], [place] [address]  Magnesium     Status: None   Collection Time: 11/24/20 11:43 AM  Result Value Ref Range   Magnesium 1.9 1.7 - 2.4 mg/dL    Comment: Performed at [facility], [place] [address] [place], [place] [address]  Phosphorus     Status: Abnormal   Collection Time: 11/24/20 11:43 AM  Result Value Ref Range   Phosphorus 2.4 (L) 2.5 - 4.6 mg/dL    Comment: Performed at [facility], [place] [address], [place],  [address]  CBC with  Differential/Platelet     Status: Abnormal   Collection Time: 11/25/20  3:34 AM  Result Value Ref Range   WBC 9.6 4.0 - 10.5 K/uL   RBC 2.61 (L) 4.22 - 5.81 MIL/uL   Hemoglobin 7.6 (L) 13.0 - 17.0 g/dL   HCT 24.1 (L) 39.0 - 52.0 %   MCV 92.3 80.0 - 100.0 fL   MCH 29.1 26.0 - 34.0 pg   MCHC 31.5 30.0 - 36.0 g/dL   RDW 15.9 (H) 11.5 - 15.5 %   Platelets 207 150 - 400 K/uL   nRBC 0.0 0.0 - 0.2 %   Neutrophils Relative % 62 %   Neutro Abs 5.9 1.7 - 7.7 K/uL  Lymphocytes Relative 23 %   Lymphs Abs 2.3 0.7 - 4.0 K/uL   Monocytes Relative 13 %   Monocytes Absolute 1.2 (H) 0.1 - 1.0 K/uL   Eosinophils Relative 2 %   Eosinophils Absolute 0.2 0.0 - 0.5 K/uL   Basophils Relative 0 %   Basophils Absolute 0.0 0.0 - 0.1 K/uL   Immature Granulocytes 0 %   Abs Immature Granulocytes 0.03 0.00 - 0.07 K/uL    Comment: Performed at [facility], [place] [address] [place], [place] [address]  Comprehensive metabolic panel     Status: Abnormal   Collection Time: 11/25/20  3:34 AM  Result Value Ref Range   Sodium 139 135 - 145 mmol/L    Comment: DELTA CHECK NOTED   Potassium 3.4 (L) 3.5 - 5.1 mmol/L   Chloride 112 (H) 98 - 111 mmol/L   CO2 20 (L) 22 - 32 mmol/L   Glucose, Bld 92 70 - 99 mg/dL    Comment: Glucose reference range applies only to samples taken after fasting for at least 8 hours.   BUN 41 (H) 8 - 23 mg/dL   Creatinine, Ser 1.41 (H) 0.61 - 1.24 mg/dL   Calcium 7.6 (L) 8.9 - 10.3 mg/dL   Total Protein 5.2 (L) 6.5 - 8.1 g/dL   Albumin 2.5 (L) 3.5 - 5.0 g/dL   AST 16 15 - 41 U/L   ALT 13 0 - 44 U/L   Alkaline Phosphatase 50 38 - 126 U/L   Total Bilirubin 0.9 0.3 - 1.2 mg/dL   GFR, Estimated 46 (L) >60 mL/min    Comment: (NOTE) Calculated using the CKD-EPI Creatinine Equation (2021)    Anion gap 7 5 - 15    Comment: Performed at [facility], [place] [address], [place], [place] [address]  Magnesium     Status: None   Collection Time: 11/25/20   3:34 AM  Result Value Ref Range   Magnesium 1.7 1.7 - 2.4 mg/dL    Comment: Performed at [facility], [place] [address], [place], [place] [address]  Phosphorus     Status: Abnormal   Collection Time: 11/25/20  3:34 AM  Result Value Ref Range   Phosphorus 2.3 (L) 2.5 - 4.6 mg/dL    Comment: Performed at [facility], [place] [address] [place], [place] [address]    Imaging / Studies: CT CHEST W CONTRAST  Result Date: 11/24/2020 CLINICAL DATA:  Hematemesis, suspect large hiatal hernia EXAM: CT CHEST, ABDOMEN, AND PELVIS WITH CONTRAST TECHNIQUE: Multidetector CT imaging of the chest, abdomen and pelvis was performed following the standard protocol during bolus administration of intravenous contrast. CONTRAST:  68mL OMNIPAQUE IOHEXOL 300 MG/ML SOLN, additional oral enteric contrast COMPARISON:  None. FINDINGS: CT CHEST FINDINGS Cardiovascular: Aortic atherosclerosis. Normal heart size. Scattered coronary artery calcifications. No pericardial effusion. Mediastinum/Nodes: No enlarged mediastinal, hilar, or axillary lymph nodes. Large hiatal hernia with complete intrathoracic position of the stomach as well as portions of the pancreatic head and neck and multiple loops of nonobstructed small bowel. Thyroid gland, trachea, and esophagus demonstrate no significant findings. Lungs/Pleura: Trace bilateral pleural effusions. Compressive atelectasis of the right lower lobe associated with a large hiatal hernia. Musculoskeletal: No chest wall mass or suspicious bone lesions identified. CT ABDOMEN PELVIS FINDINGS Hepatobiliary: No solid liver abnormality is seen. No gallstones, gallbladder wall thickening, or biliary dilatation. Pancreas: Unremarkable. No pancreatic ductal dilatation or surrounding inflammatory changes. Spleen: Normal in size without significant abnormality. Adrenals/Urinary  Tract: Adrenal glands are unremarkable. Severe bilateral hydronephrosis and hydroureter to the  ureterovesicular junctions. There is severe urinary bladder wall thickening and distention of the urinary bladder. Stomach/Bowel: Stomach is within normal limits. Appendix appears normal. No evidence of bowel wall thickening, distention, or inflammatory changes. Vascular/Lymphatic: Aortic atherosclerosis. No enlarged abdominal or pelvic lymph nodes. Reproductive: TURP defect of the prostate. Other: There is a midline epigastric hernia containing multiple loops of nonobstructed small bowel (series 2, image 72). No abdominopelvic ascites. Musculoskeletal: No acute or significant osseous findings. IMPRESSION: 1. Large hiatal hernia with complete intrathoracic position of the stomach as well as portions of the pancreatic head and neck and multiple loops of nonobstructed small bowel. 2. Trace bilateral pleural effusions. Compressive atelectasis of the right lower lobe associated with a large hiatal hernia. 3. Severe bilateral hydronephrosis and hydroureter to the ureterovesicular junctions. There is severe urinary bladder wall thickening and distention of the urinary bladder. No obstructing calculi or other lesion identified. 4. TURP defect of the prostate. 5. There is a midline epigastric hernia containing multiple loops of nonobstructed small bowel. 6. Coronary artery disease. Aortic Atherosclerosis (ICD10-I70.0). Electronically Signed   By: Eddie Candle M.D.   On: 11/24/2020 23:27   CT ABDOMEN PELVIS W CONTRAST  Result Date: 11/24/2020 CLINICAL DATA:  Hematemesis, suspect large hiatal hernia EXAM: CT CHEST, ABDOMEN, AND PELVIS WITH CONTRAST TECHNIQUE: Multidetector CT imaging of the chest, abdomen and pelvis was performed following the standard protocol during bolus administration of intravenous contrast. CONTRAST:  35mL OMNIPAQUE IOHEXOL 300 MG/ML SOLN, additional oral enteric contrast COMPARISON:  None. FINDINGS: CT CHEST FINDINGS Cardiovascular: Aortic atherosclerosis. Normal heart size. Scattered coronary  artery calcifications. No pericardial effusion. Mediastinum/Nodes: No enlarged mediastinal, hilar, or axillary lymph nodes. Large hiatal hernia with complete intrathoracic position of the stomach as well as portions of the pancreatic head and neck and multiple loops of nonobstructed small bowel. Thyroid gland, trachea, and esophagus demonstrate no significant findings. Lungs/Pleura: Trace bilateral pleural effusions. Compressive atelectasis of the right lower lobe associated with a large hiatal hernia. Musculoskeletal: No chest wall mass or suspicious bone lesions identified. CT ABDOMEN PELVIS FINDINGS Hepatobiliary: No solid liver abnormality is seen. No gallstones, gallbladder wall thickening, or biliary dilatation. Pancreas: Unremarkable. No pancreatic ductal dilatation or surrounding inflammatory changes. Spleen: Normal in size without significant abnormality. Adrenals/Urinary Tract: Adrenal glands are unremarkable. Severe bilateral hydronephrosis and hydroureter to the ureterovesicular junctions. There is severe urinary bladder wall thickening and distention of the urinary bladder. Stomach/Bowel: Stomach is within normal limits. Appendix appears normal. No evidence of bowel wall thickening, distention, or inflammatory changes. Vascular/Lymphatic: Aortic atherosclerosis. No enlarged abdominal or pelvic lymph nodes. Reproductive: TURP defect of the prostate. Other: There is a midline epigastric hernia containing multiple loops of nonobstructed small bowel (series 2, image 72). No abdominopelvic ascites. Musculoskeletal: No acute or significant osseous findings. IMPRESSION: 1. Large hiatal hernia with complete intrathoracic position of the stomach as well as portions of the pancreatic head and neck and multiple loops of nonobstructed small bowel. 2. Trace bilateral pleural effusions. Compressive atelectasis of the right lower lobe associated with a large hiatal hernia. 3. Severe bilateral hydronephrosis and  hydroureter to the ureterovesicular junctions. There is severe urinary bladder wall thickening and distention of the urinary bladder. No obstructing calculi or other lesion identified. 4. TURP defect of the prostate. 5. There is a midline epigastric hernia containing multiple loops of nonobstructed small bowel. 6. Coronary artery disease. Aortic Atherosclerosis (ICD10-I70.0). Electronically Signed  By: Eddie Candle M.D.   On: 11/24/2020 23:27    Medications / Allergies: per chart  Antibiotics: Anti-infectives (From admission, onward)   None        Note: Portions of this report may have been transcribed using voice recognition software. Every effort was made to ensure accuracy; however, inadvertent computerized transcription errors may be present.   Any transcriptional errors that result from this process are unintentional.    Gregory Hector, MD, FACS, MASCRS  Esophageal, Gastrointestinal & Colorectal Surgery Robotic and Minimally Invasive Surgery [facility] [address] [place], [place] [address] [phone] Fax [phone] Main/Paging  CONTACT INFORMATION: Weekday (9AM-5PM) concerns: Call [facility] main office at [phone] Weeknight (5PM-9AM) or Weekend/Holiday concerns: Check [email or web address] for General Surgery [facility] coverage (Please, do not use SecureChat as it is not reliable communication to operating surgeons for immediate patient care)      11/25/2020  7:41 AM

## 2020-11-25 NOTE — Progress Notes (Signed)
Dr. Claudia Desanctis in to see patient and discuss placement of foley catheter and reason. Pt acknowledged understanding and has decided to wait for placement  and speak with [place] attorney/Health care POA, before agreeing to insertion. SRP, RN

## 2020-11-26 ENCOUNTER — Encounter (HOSPITAL_COMMUNITY): Payer: Self-pay | Admitting: Gastroenterology

## 2020-11-26 DIAGNOSIS — K449 Diaphragmatic hernia without obstruction or gangrene: Secondary | ICD-10-CM | POA: Diagnosis not present

## 2020-11-26 DIAGNOSIS — K922 Gastrointestinal hemorrhage, unspecified: Secondary | ICD-10-CM | POA: Diagnosis not present

## 2020-11-26 DIAGNOSIS — Z7189 Other specified counseling: Secondary | ICD-10-CM

## 2020-11-26 DIAGNOSIS — K253 Acute gastric ulcer without hemorrhage or perforation: Secondary | ICD-10-CM | POA: Diagnosis not present

## 2020-11-26 DIAGNOSIS — Z515 Encounter for palliative care: Secondary | ICD-10-CM

## 2020-11-26 LAB — CBC WITH DIFFERENTIAL/PLATELET
Abs Immature Granulocytes: 0.1 10*3/uL — ABNORMAL HIGH (ref 0.00–0.07)
Basophils Absolute: 0 10*3/uL (ref 0.0–0.1)
Basophils Relative: 0 %
Eosinophils Absolute: 0.2 10*3/uL (ref 0.0–0.5)
Eosinophils Relative: 3 %
HCT: 24.8 % — ABNORMAL LOW (ref 39.0–52.0)
Hemoglobin: 8.1 g/dL — ABNORMAL LOW (ref 13.0–17.0)
Immature Granulocytes: 1 %
Lymphocytes Relative: 21 %
Lymphs Abs: 1.9 10*3/uL (ref 0.7–4.0)
MCH: 29.7 pg (ref 26.0–34.0)
MCHC: 32.7 g/dL (ref 30.0–36.0)
MCV: 90.8 fL (ref 80.0–100.0)
Monocytes Absolute: 1.2 10*3/uL — ABNORMAL HIGH (ref 0.1–1.0)
Monocytes Relative: 13 %
Neutro Abs: 5.7 10*3/uL (ref 1.7–7.7)
Neutrophils Relative %: 62 %
Platelets: 240 10*3/uL (ref 150–400)
RBC: 2.73 MIL/uL — ABNORMAL LOW (ref 4.22–5.81)
RDW: 15.9 % — ABNORMAL HIGH (ref 11.5–15.5)
WBC: 9.2 10*3/uL (ref 4.0–10.5)
nRBC: 0 % (ref 0.0–0.2)

## 2020-11-26 LAB — COMPREHENSIVE METABOLIC PANEL
ALT: 12 U/L (ref 0–44)
AST: 14 U/L — ABNORMAL LOW (ref 15–41)
Albumin: 2.5 g/dL — ABNORMAL LOW (ref 3.5–5.0)
Alkaline Phosphatase: 54 U/L (ref 38–126)
Anion gap: 6 (ref 5–15)
BUN: 24 mg/dL — ABNORMAL HIGH (ref 8–23)
CO2: 19 mmol/L — ABNORMAL LOW (ref 22–32)
Calcium: 7.7 mg/dL — ABNORMAL LOW (ref 8.9–10.3)
Chloride: 109 mmol/L (ref 98–111)
Creatinine, Ser: 1.38 mg/dL — ABNORMAL HIGH (ref 0.61–1.24)
GFR, Estimated: 48 mL/min — ABNORMAL LOW (ref >60)
Glucose, Bld: 103 mg/dL — ABNORMAL HIGH (ref 70–99)
Potassium: 3.9 mmol/L (ref 3.5–5.1)
Sodium: 134 mmol/L — ABNORMAL LOW (ref 135–145)
Total Bilirubin: 1 mg/dL (ref 0.3–1.2)
Total Protein: 5.3 g/dL — ABNORMAL LOW (ref 6.5–8.1)

## 2020-11-26 LAB — PREALBUMIN: Prealbumin: 10.9 mg/dL — ABNORMAL LOW (ref 18–38)

## 2020-11-26 LAB — PHOSPHORUS: Phosphorus: 2.6 mg/dL (ref 2.5–4.6)

## 2020-11-26 LAB — TSH: TSH: 3.249 u[IU]/mL (ref 0.350–4.500)

## 2020-11-26 LAB — MAGNESIUM: Magnesium: 1.7 mg/dL (ref 1.7–2.4)

## 2020-11-26 MED ORDER — ENSURE ENLIVE PO LIQD
237.0000 mL | Freq: Two times a day (BID) | ORAL | Status: DC
  Administered 2020-11-26 – 2020-11-30 (×7): 237 mL via ORAL

## 2020-11-26 MED ORDER — MAGNESIUM SULFATE 2 GM/50ML IV SOLN
2.0000 g | Freq: Once | INTRAVENOUS | Status: AC
  Administered 2020-11-26: 2 g via INTRAVENOUS
  Filled 2020-11-26: qty 50

## 2020-11-26 NOTE — Progress Notes (Signed)
2 Days Post-Op   Subjective/Chief Complaint: Pt with no acute issues   Objective: Vital signs in last 24 hours: Temp:  [97.7 F (36.5 C)-98.6 F (37 C)] 98.6 F (37 C) (04/11 0610) Pulse Rate:  [40-51] 51 (04/11 0610) Resp:  [17-20] 17 (04/11 0610) BP: (127-140)/(62-68) 140/62 (04/11 0610) SpO2:  [98 %-100 %] 100 % (04/11 0610) Weight:  [67.1 kg-70.1 kg] 67.1 kg (04/10 1448) Last BM Date: 11/24/20  Intake/Output from previous day: 04/10 0701 - 04/11 0700 In: 1712.3 [P.O.:10; I.V.:1499.6; IV Piggyback:202.8] Out: 2200 [Urine:2200] Intake/Output this shift: No intake/output data recorded.  PE:  Constitutional: No acute distress, conversant, appears states age. Eyes: Anicteric sclerae, moist conjunctiva, no lid lag Lungs: Clear to auscultation bilaterally, normal respiratory effort CV: regular rate and rhythm, no murmurs, no peripheral edema, pedal pulses 2+ GI: Soft, no masses or hepatosplenomegaly, non-tender to palpation Skin: No rashes, palpation reveals normal turgor Psychiatric: appropriate judgment and insight, oriented to person, place, and time   Lab Results:  Recent Labs    11/25/20 0334 11/25/20 1549 11/26/20 0332  WBC 9.6  --  9.2  HGB 7.6* 7.2* 8.1*  HCT 24.1* 24.7* 24.8*  PLT 207  --  240   BMET Recent Labs    11/25/20 0334 11/26/20 0332  NA 139 134*  K 3.4* 3.9  CL 112* 109  CO2 20* 19*  GLUCOSE 92 103*  BUN 41* 24*  CREATININE 1.41* 1.38*  CALCIUM 7.6* 7.7*   PT/INR No results for input(s): LABPROT, INR in the last 72 hours. ABG No results for input(s): PHART, HCO3 in the last 72 hours.  Invalid input(s): PCO2, PO2  Studies/Results: CT CHEST W CONTRAST  Result Date: 11/24/2020 CLINICAL DATA:  Hematemesis, suspect large hiatal hernia EXAM: CT CHEST, ABDOMEN, AND PELVIS WITH CONTRAST TECHNIQUE: Multidetector CT imaging of the chest, abdomen and pelvis was performed following the standard protocol during bolus administration of  intravenous contrast. CONTRAST:  48mL OMNIPAQUE IOHEXOL 300 MG/ML SOLN, additional oral enteric contrast COMPARISON:  None. FINDINGS: CT CHEST FINDINGS Cardiovascular: Aortic atherosclerosis. Normal heart size. Scattered coronary artery calcifications. No pericardial effusion. Mediastinum/Nodes: No enlarged mediastinal, hilar, or axillary lymph nodes. Large hiatal hernia with complete intrathoracic position of the stomach as well as portions of the pancreatic head and neck and multiple loops of nonobstructed small bowel. Thyroid gland, trachea, and esophagus demonstrate no significant findings. Lungs/Pleura: Trace bilateral pleural effusions. Compressive atelectasis of the right lower lobe associated with a large hiatal hernia. Musculoskeletal: No chest wall mass or suspicious bone lesions identified. CT ABDOMEN PELVIS FINDINGS Hepatobiliary: No solid liver abnormality is seen. No gallstones, gallbladder wall thickening, or biliary dilatation. Pancreas: Unremarkable. No pancreatic ductal dilatation or surrounding inflammatory changes. Spleen: Normal in size without significant abnormality. Adrenals/Urinary Tract: Adrenal glands are unremarkable. Severe bilateral hydronephrosis and hydroureter to the ureterovesicular junctions. There is severe urinary bladder wall thickening and distention of the urinary bladder. Stomach/Bowel: Stomach is within normal limits. Appendix appears normal. No evidence of bowel wall thickening, distention, or inflammatory changes. Vascular/Lymphatic: Aortic atherosclerosis. No enlarged abdominal or pelvic lymph nodes. Reproductive: TURP defect of the prostate. Other: There is a midline epigastric hernia containing multiple loops of nonobstructed small bowel (series 2, image 72). No abdominopelvic ascites. Musculoskeletal: No acute or significant osseous findings. IMPRESSION: 1. Large hiatal hernia with complete intrathoracic position of the stomach as well as portions of the pancreatic  head and neck and multiple loops of nonobstructed small bowel. 2. Trace bilateral pleural effusions.  Compressive atelectasis of the right lower lobe associated with a large hiatal hernia. 3. Severe bilateral hydronephrosis and hydroureter to the ureterovesicular junctions. There is severe urinary bladder wall thickening and distention of the urinary bladder. No obstructing calculi or other lesion identified. 4. TURP defect of the prostate. 5. There is a midline epigastric hernia containing multiple loops of nonobstructed small bowel. 6. Coronary artery disease. Aortic Atherosclerosis (ICD10-I70.0). Electronically Signed   By: Eddie Candle M.D.   On: 11/24/2020 23:27   CT ABDOMEN PELVIS W CONTRAST  Result Date: 11/24/2020 CLINICAL DATA:  Hematemesis, suspect large hiatal hernia EXAM: CT CHEST, ABDOMEN, AND PELVIS WITH CONTRAST TECHNIQUE: Multidetector CT imaging of the chest, abdomen and pelvis was performed following the standard protocol during bolus administration of intravenous contrast. CONTRAST:  1mL OMNIPAQUE IOHEXOL 300 MG/ML SOLN, additional oral enteric contrast COMPARISON:  None. FINDINGS: CT CHEST FINDINGS Cardiovascular: Aortic atherosclerosis. Normal heart size. Scattered coronary artery calcifications. No pericardial effusion. Mediastinum/Nodes: No enlarged mediastinal, hilar, or axillary lymph nodes. Large hiatal hernia with complete intrathoracic position of the stomach as well as portions of the pancreatic head and neck and multiple loops of nonobstructed small bowel. Thyroid gland, trachea, and esophagus demonstrate no significant findings. Lungs/Pleura: Trace bilateral pleural effusions. Compressive atelectasis of the right lower lobe associated with a large hiatal hernia. Musculoskeletal: No chest wall mass or suspicious bone lesions identified. CT ABDOMEN PELVIS FINDINGS Hepatobiliary: No solid liver abnormality is seen. No gallstones, gallbladder wall thickening, or biliary dilatation.  Pancreas: Unremarkable. No pancreatic ductal dilatation or surrounding inflammatory changes. Spleen: Normal in size without significant abnormality. Adrenals/Urinary Tract: Adrenal glands are unremarkable. Severe bilateral hydronephrosis and hydroureter to the ureterovesicular junctions. There is severe urinary bladder wall thickening and distention of the urinary bladder. Stomach/Bowel: Stomach is within normal limits. Appendix appears normal. No evidence of bowel wall thickening, distention, or inflammatory changes. Vascular/Lymphatic: Aortic atherosclerosis. No enlarged abdominal or pelvic lymph nodes. Reproductive: TURP defect of the prostate. Other: There is a midline epigastric hernia containing multiple loops of nonobstructed small bowel (series 2, image 72). No abdominopelvic ascites. Musculoskeletal: No acute or significant osseous findings. IMPRESSION: 1. Large hiatal hernia with complete intrathoracic position of the stomach as well as portions of the pancreatic head and neck and multiple loops of nonobstructed small bowel. 2. Trace bilateral pleural effusions. Compressive atelectasis of the right lower lobe associated with a large hiatal hernia. 3. Severe bilateral hydronephrosis and hydroureter to the ureterovesicular junctions. There is severe urinary bladder wall thickening and distention of the urinary bladder. No obstructing calculi or other lesion identified. 4. TURP defect of the prostate. 5. There is a midline epigastric hernia containing multiple loops of nonobstructed small bowel. 6. Coronary artery disease. Aortic Atherosclerosis (ICD10-I70.0). Electronically Signed   By: Eddie Candle M.D.   On: 11/24/2020 23:27    Anti-infectives: Anti-infectives (From admission, onward)   None      Assessment/Plan: 41M incarcerated giant hiatal hernia with [name] ulcerations leading to GIB. CKD HTN [facility] Hypothyroidism  -Pt will likely need gastric reduction and gastropexy to help resolved  cameron's ulcers and GIB. D/t his adv age and comorbidities, would not rec hiatal hernia repair . -Pt states he is in favor of surgery but would want to meet with his daughter and attorney first. -If patient is agreeable would proceed to OR Tue/Wed for procedure.   LOS: 3 days    Ralene Ok 11/26/2020

## 2020-11-26 NOTE — Progress Notes (Signed)
Chaplain engaged in an initial visit with [name].  Chaplain learned about [name] life and an upcoming meeting [name] is having regarding his care.  [name] shared that [name] grew up in the [place] and was raised by his paternal family.  At 56, [name] joined the [name] and served for about four years.  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  [name] was able to get a degree in business and do work as a [name] ([employer]).  Throughout his life [name] described it as if someone was always looking out for him.  [name] often had opportunities that were given to him as if [name] was in the right place at the right time.  [name] is also the father of two girls.  One of his daughters passed during Christmas of complications from diabetes.    [name] also shared about what is happening with his stomach.  [name] noted that [name] doesn't feel any pain in his stomach but that physicians have told him that [name] needs to have a procedure.  [name] is in a place of deciding if that is necessary or not.  [name] voiced having the support of his daughter and lawyer to make the best decisions for him.    Chaplain offered listening, presence and support.    11/26/20 1200  Clinical Encounter Type  Visited With Patient  Visit Type Initial

## 2020-11-26 NOTE — Progress Notes (Signed)
PROGRESS NOTE    [name]  NTZ:[id number] DOB: 12-May-1925 DOA: 11/23/2020 PCP: Gayland Curry, DO   Brief Narrative:  The patient is a 87 year old elderly Caucasian male with a past medical history significant for but not limited to cognitive impairment, chronic back pain, chronic kidney disease stage IIIb, hypothyroidism, GERD as well as other comorbidities who presented with concerns of coffee-ground emesis.  He was unable to provide a subjective history given his cognitive impairment but reportedly per documentation he had been sent over from his SNF after the staff had noticed coffee-ground emesis.  Patient denies any pain and stated that he felt fine and states that only saw some dark substances in his vomit but not sure that it was bloody.  In the ED he is found to be briefly tachycardic that resolved spontaneously and patient was normotensive on room air.  His hemoglobin was noted to be 6.5 down from a baseline of 11-12.  Initially had a lactic of 2.8 and FOBT was positive.  The EDP message to GI for consultation and patient was ordered 2 units of PRBCs and started on IV pantoprazole infusion.  He underwent EGD this morning and it showed a very abnormal upper GI anatomy with an apparent large prolapsing hiatal hernia with a large deep not circumferential ulcer at the GE junction versus the proximal stomach as well as a dark eschar at the ulcer site.  There is no evidence of any active bleeding however the surrounding mucosa was edematous and friable and somewhat neoplastic appearing however not firm on forcep biopsies.  The GI physician recommended to await the pathology results and they are ordering a CT chest/abdomen/pelvis to get a better idea of his anatomy and they are recommending clear liquid diet for now as well as IV PPI twice daily.  CT of the chest abdomen pelvis was done large hiatal hernia with complete intrathoracic position in the stomach as well as portions of the pancreatic head  and neck and multiple loops of nonobstructed small bowel.  Trace bilateral pleural effusions noted and compressive atelectasis of the right lower lobe associated with large hiatal hernia.  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  There is also a midline epigastric hernia containing multiple loops of nonobstructive small bowel and patient was noted to have CAD as well.  Gastroenterology recommends continuing on a clear liquid diet and feel that the patient is hiatal hernia is very large and needs surgical repair and Dr. Johney Maine of [facility] has evaluated.  GI recommends cycling H&H every 12 hours for 4 occurrences and transfusion as needed less than 7. General Surgery feels that the standard of care would be considering a reduction repair of his large hiatal hernia given his advanced age and some deconditioning chronic malnutrition they are recommending medical and cardiac clearance.  They feel he would benefit from a feeding gastrostomy tube perioperatively and Dr. Johney Maine feels that he has some dysphagia issues.  Dr. Rosendo Gros to evaluate tomorrow but palliative care has also been consulted for further goals of care discussion  Given his findings on CT scan urology was consulted and they recommended Foley catheter placement initially patient refused and then was agreeable so this was placed to help decompression of the hydronephrosis.  Assessment & Plan:  Principal Problem:   Acute GI bleeding from [facility] ulcerations in giant hiatal hernia Active Problems:   Protein-calorie malnutrition, moderate (HCC)   CKD (chronic kidney disease), stage III (HCC)   Umbilical hernia - reducible   HTN (hypertension)   Hypothyroidism   Cognitive impairment    Incarcerated giant hiatal hernia   HOH (hard of hearing)   Scoliosis of thoracolumbar region due to degenerative disease of spine in adult   Right groin mass c/w sebaceous cyst   [name], acute with bleeding & anemia   IDA (iron deficiency anemia)   Uses roller walker  Acute blood loss anemia from upper GI bleed -Patient presented with a hemoglobin of 6.5 and a hematocrit of 20.9.  Positive FOBT. -Last colonoscopy done in 2012 with no polyps or other findings -He was typed and screened and transfused 2 unit PRBCs on 4/8; after his transfusion his hemoglobin/hematocrit improved to 8.5/25.9 yesterday and dropped to 7.6/24.1 -> 7.2/24.7 -> 8.1/24.8 -Continued IV PPI infusion per GI recommendations -GI evaluated and recommended an EGD and patient was taken down and EGD showed a very abnormal upper GI anatomy with an apparent large prolapsing hiatal hernia with a large deep not circumferential ulcer at the GE junction versus the proximal stomach as well as a dark eschar at the ulcer site.  There is no evidence of any active bleeding however the surrounding mucosa was edematous and friable and somewhat neoplastic appearing however not firm on forcep biopsies.   -The GI Team recommended to await the pathology results and they are ordering a CT chest/abdomen/pelvis to get a better idea of his anatomy and they are recommending clear liquid diet for now but advancing to [facility] as well as IV PPI twice daily. -CT scan was done and showed a large hiatal hernia with complete intrathoracic position in the stomach as well as portions of the pancreatic head and neck and multiple loops of nonobstructed small bowel.  Trace bilateral pleural effusions noted and compressive atelectasis of the right lower lobe associated with large hiatal hernia.  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  There is also a midline epigastric hernia containing multiple loops of nonobstructive small bowel and patient was noted to have CAD as well.  -Gastroenterology recommends continuing on a clear liquid diet and feel that the patient is hiatal hernia is very large and needs surgical repair and Dr. Johney Maine of [facility] has evaluated.  GI recommends cycling H&H every 12 hours for 4 occurrences and transfusion as needed less than 7. -General Surgery re-evaluated today due to gastric reduction and gastropexy to help resolve [place] ulcers or GI bleed but due to his advanced age and comorbidities they do not recommend a hiatal hernia repair -Continue to monitor for signs and symptoms of bleeding and follow H/H q12 x4 and repeat CBC in the a.m. -Patient's Pre-Albumin was 11.7 -> 10.9 -Palliative Care Consulted for further GOC Discussion and this is to be done today -Patient is currently leaning toward surgery and wants to discuss with his daughter and has healthcare power of attorney but if he is agreeable to surgery this will be done on Tuesday or Wednesday  -Cardiology consulted for preoperative clearance in case the patient does go for surgical intervention tomorrow  Lactic Acidosis  -  Presented with a lactic acid of 2.2 and this is now further trended down resolved to 1.0 -He was given 2 units of PRBCs  -We will start D5W + 20 mEQ of KCl at 75 mL's per hour and continue for now  Bilateral Hydronephrosis in the setting of Urinary Retention -Urology consulted given findings on CT scan as above -Dr. Claudia Desanctis discussed placement of indwelling Foley catheter to help decompress the patient's hydronephrosis however the patient is asymptomatic and kidney function is at baseline; initially was unsure whether he would like the Foley catheter not and after discussion with his healthcare power of  attorney they were agreeable until Foley catheter was placed -Dr. Claudia Desanctis is unsure that placing a Foley catheter improve his clinical status and he has no evidence of acute on chronic renal failure infection or discomfort and had recommended observation but Foley catheter was placed -Continue Foley catheter care and repeat a renal ultrasound and next 72 hours  CKD stage 3b Metabolic acidosis,  -Patient's BUNs/creatinine went from 65/1.62 and is now trended down to 61/1.53 -> 41/1.41 and improved to 24/1.38 -Started the patient on D5W + 20 mEQ at 75 mils per hour but will stop now -Patient has a slight acidosis with a CO2 of 19, anion gap 6, chloride level of 109 -Foley Catether has been placed and draining well  -Avoid nephrotoxic medications, contrast dyes, hypotension and renally dose medications -Repeat CMP in a.m.  Hypernatremia and and now hyponatremic -Patient's sodium went from 140 is now 146 -> 139 -Started gentle IV fluid hydration with D5W +20 mEq of KCl at 75 mL's per hour but will hold off now -Continue monitor and trend and repeat CMP in a.m.  Hypokalemia -Patient's potassium this morning was 3.1 -> 3.4 -> 3.9 -Replete with po KCl 40 mEQ x1 and IV K Phos 10 mmol and IVF with D5W +20 mEq of KCl at 75 mL's per hour now stopped  -Checked magnesium level and was 1.7 and will replete  We will continue monitor and replete as necessary -Repeat CMP in the a.m.  Hypophosphatemia -Patient's Phos Level was 2.3 -> 2.6 -Continue to Monitor and Replete as Necessary  -Repeat Phos Level in the AM   Hyperglycemia  -Patient blood sugar this morning was 100 on BMP and yesterday was 130 on CMP -Check hemoglobin A1c in the morning  -Continue monitor blood sugars carefully and if necessary will place on Sensitive Novolog SSI AC  Hypothyroidism -Continue Levothyroxine 25 mcg po Daily -Checked TSH and was 3.249  Hypertension -Continue to Hold amlodipine for now given significant  anemia  Cognitive impairment -C/w Quetiapine 25 mg po BID   GOC: DNR,poA -Palliative Consulted for further evaluation and recommendations   DVT prophylaxis: SCDs Code Status: DO NOT RESUSCITATE  Family Communication: Discussed with his daughter [name] over the telephone Disposition Plan: Pending further clinical Improvement and clearance by Gastroenterology, General Surgery, and evaluation by Cardiology  Status is: Inpatient  Remains inpatient appropriate because:Unsafe d/c plan, IV treatments appropriate due to intensity of illness or inability to take PO and Inpatient level of care appropriate due to severity of illness   Dispo: The patient is from: SNF              Anticipated d/c is to: SNF              Patient currently is not medically stable to d/c.   Difficult to place patient No  Consultants:   Gastroenterology  General Surgery  Palliative Care Medicine   Cardiology   Procedures:  EGD Findings:      Very abnormal UGI anatomy with apparent large, prolapsing hiatal hernia.       I suspect much of his stomach is above the diaphragm.      Large, deep, non-cirumferential ulcer (3-4cm across) at GE junction vs.       proximal stomach (difficult to tell exact location given the abnormal       anatomy mentioned above. There is dark eschar at the ulcer site. There       is no active bleeding. The surrounding mucosa is edematous, friable,       somewhat neoplastic appearing however it was not firm on forcep biopsies.      The exam was otherwise without abnormality. Impression:               - Very abnormal UGI anatomy with apparent large,                            prolapsing hiatal hernia. I suspect much of his                            stomach is above the diaphragm.                           - Large, deep, non-cirumferential ulcer (3-4cm                            across) at GE junction vs. proximal stomach                            (difficult to tell exact  location given the                            abnormal anatomy mentioned above. There is dark                            eschar at the ulcer site. There is no active                            bleeding. The surrounding mucosa is edematous,                            friable, somewhat neoplastic appearing however it                            was not firm on forcep biopsies. Moderate Sedation:      Not Applicable - Patient had care per Anesthesia. Recommendation:           - Await pathology results.                           - I am ordering a CT scan chest, abd, pelvis to get                            a better idea of his anatomy.                           -  Clear liquid diet for now.                           - IV PPI BID  Antimicrobials:  Anti-infectives (From admission, onward)   None        Subjective: Seen and examined at bedside he is feeling relatively well and denied any pain.  No nausea or vomiting.  States that he feels comfortable and states that the Foley catheter is draining quite a bit.  No other concerns or complaints at this time and he is leaning towards getting surgical intervention but wants to speak with his family first.  Objective: Vitals:   11/25/20 1448 11/25/20 2142 11/26/20 0610 11/26/20 1331  BP:  127/64 140/62 (!) 141/60  Pulse:  (!) 49 (!) 51 (!) 42  Resp:  18 17 17   Temp:  98.3 F (36.8 C) 98.6 F (37 C) 98.2 F (36.8 C)  TempSrc:  Oral  Oral  SpO2:  98% 100% 100%  Weight: 67.1 kg     Height:        Intake/Output Summary (Last 24 hours) at 11/26/2020 1455 Last data filed at 11/26/2020 1058 Gross per 24 hour  Intake 1712.32 ml  Output 3500 ml  Net -1787.68 ml   Filed Weights   11/25/20 1256 11/25/20 1448  Weight: 70.1 kg 67.1 kg   Examination: Physical Exam:  Constitutional: WN/WD elderly Caucasian male currently in no acute distress appears calm and comfortable Eyes: Lids and conjunctivae normal, sclerae anicteric  ENMT: External  Ears, Nose appear normal. Grossly normal hearing.  Neck: Appears normal, supple, no cervical masses, normal ROM, no appreciable thyromegaly; no JVD Respiratory: Clear to auscultation bilaterally, no wheezing, rales, rhonchi or crackles. Normal respiratory effort and patient is not tachypenic. No accessory muscle use.  Cardiovascular: RRR, no murmurs / rubs / gallops. S1 and S2 auscultated. No appreciable extremity edema Abdomen: Soft, non-tender, non-distended. Bowel sounds positive.  GU:  Foley catheter is in place and draining clear yellow color urine Musculoskeletal: No clubbing / cyanosis of digits/nails. No joint deformity upper and lower extremities.  Skin: No rashes, lesions, ulcers on limited skin evaluation. No induration; Warm and dry.  Neurologic: CN 2-12 grossly intact with no focal deficits.  Romberg sign and cerebellar reflexes not assessed.  Psychiatric: Normal judgment and insight. Alert and oriented x 3. Normal mood and appropriate affect.   Data Reviewed: I have personally reviewed following labs and imaging studies  CBC: Recent Labs  Lab 11/23/20 1859 11/24/20 1143 11/25/20 0334 11/25/20 1549 11/26/20 0332  WBC 12.8* 11.9* 9.6  --  9.2  NEUTROABS 9.6*  --  5.9  --  5.7  HGB 6.5* 8.5* 7.6* 7.2* 8.1*  HCT 20.9* 25.9* 24.1* 24.7* 24.8*  MCV 90.5 89.3 92.3  --  90.8  PLT 274 231 207  --  595   Basic Metabolic Panel: Recent Labs  Lab 11/23/20 1859 11/24/20 1143 11/25/20 0334 11/26/20 0332  NA 140 146* 139 134*  K 3.6 3.1* 3.4* 3.9  CL 110 118* 112* 109  CO2 20* 22 20* 19*  GLUCOSE 138* 100* 92 103*  BUN 65* 61* 41* 24*  CREATININE 1.62* 1.53* 1.41* 1.38*  CALCIUM 7.8* 8.0* 7.6* 7.7*  MG  --  1.9 1.7 1.7  PHOS  --  2.4* 2.3* 2.6   GFR: Estimated Creatinine Clearance: 31.7 mL/min (A) (by C-G formula based on SCr of 1.38 mg/dL (H)). Liver Function Tests: Recent  Labs  Lab 11/23/20 1859 11/25/20 0334 11/26/20 0332  AST 20 16 14*  ALT 18 13 12   ALKPHOS  58 50 54  BILITOT 0.9 0.9 1.0  PROT 6.1* 5.2* 5.3*  ALBUMIN 2.9* 2.5* 2.5*   Recent Labs  Lab 11/23/20 1859  LIPASE 28   No results for input(s): AMMONIA in the last 168 hours. Coagulation Profile: No results for input(s): INR, PROTIME in the last 168 hours. Cardiac Enzymes: No results for input(s): CKTOTAL, CKMB, CKMBINDEX, TROPONINI in the last 168 hours. BNP (last 3 results) No results for input(s): PROBNP in the last 8760 hours. HbA1C: No results for input(s): HGBA1C in the last 72 hours. CBG: No results for input(s): GLUCAP in the last 168 hours. Lipid Profile: No results for input(s): CHOL, HDL, LDLCALC, TRIG, CHOLHDL, LDLDIRECT in the last 72 hours. Thyroid Function Tests: Recent Labs    11/26/20 0332  TSH 3.249   Anemia Panel: No results for input(s): VITAMINB12, FOLATE, FERRITIN, TIBC, IRON, RETICCTPCT in the last 72 hours. Sepsis Labs: Recent Labs  Lab 11/23/20 1900 11/23/20 2125 11/24/20 0200 11/24/20 1143  LATICACIDVEN 2.8* 2.2* 1.8 1.0    Recent Results (from the past 240 hour(s))  SARS Coronavirus 2 by RT PCR (hospital order, performed in [place] hospital lab)     Status: None   Collection Time: 11/23/20  9:33 PM  Result Value Ref Range Status   SARS Coronavirus 2 NEGATIVE NEGATIVE Final    Comment: (NOTE) SARS-CoV-2 target nucleic acids are NOT DETECTED.  The SARS-CoV-2 RNA is generally detectable in upper and lower respiratory specimens during the acute phase of infection. The lowest concentration of SARS-CoV-2 viral copies this assay can detect is 250 copies / mL. A negative result does not preclude SARS-CoV-2 infection and should not be used as the sole basis for treatment or other patient management decisions.  A negative result may occur with improper specimen collection / handling, submission of specimen other than nasopharyngeal swab, presence of viral mutation(s) within the areas targeted by this assay, and inadequate number of viral  copies (<250 copies / mL). A negative result must be combined with clinical observations, patient history, and epidemiological information.  Fact Sheet for Patients:   StrictlyIdeas.no  Fact Sheet for Healthcare Providers: BankingDealers.co.za  This test is not yet approved or  cleared by the [place] FDA and has been authorized for detection and/or diagnosis of SARS-CoV-2 by FDA under an Emergency Use Authorization (EUA).  This EUA will remain in effect (meaning this test can be used) for the duration of the COVID-19 declaration under Section 564(b)(1) of the Act, 21 U.S.C. section 360bbb-3(b)(1), unless the authorization is terminated or revoked sooner.  Performed at [facility], [place] [address], [place], [place] [address]      RN Pressure Injury Documentation:     Estimated body mass index is 22.5 kg/m as calculated from the following:   Height as of this encounter: 5\' 8"  (1.727 m).   Weight as of this encounter: 67.1 kg.  Malnutrition Type:   Malnutrition Characteristics:   Nutrition Interventions:    Radiology Studies: CT CHEST W CONTRAST  Result Date: 11/24/2020 CLINICAL DATA:  Hematemesis, suspect large hiatal hernia EXAM: CT CHEST, ABDOMEN, AND PELVIS WITH CONTRAST TECHNIQUE: Multidetector CT imaging of the chest, abdomen and pelvis was performed following the standard protocol during bolus administration of intravenous contrast. CONTRAST:  55mL OMNIPAQUE IOHEXOL 300 MG/ML SOLN, additional oral enteric contrast COMPARISON:  None. FINDINGS: CT CHEST  FINDINGS Cardiovascular: Aortic atherosclerosis. Normal heart size. Scattered coronary artery calcifications. No pericardial effusion. Mediastinum/Nodes: No enlarged mediastinal, hilar, or axillary lymph nodes. Large hiatal hernia with complete intrathoracic position of the stomach as well as portions of the pancreatic head and neck and multiple loops of  nonobstructed small bowel. Thyroid gland, trachea, and esophagus demonstrate no significant findings. Lungs/Pleura: Trace bilateral pleural effusions. Compressive atelectasis of the right lower lobe associated with a large hiatal hernia. Musculoskeletal: No chest wall mass or suspicious bone lesions identified. CT ABDOMEN PELVIS FINDINGS Hepatobiliary: No solid liver abnormality is seen. No gallstones, gallbladder wall thickening, or biliary dilatation. Pancreas: Unremarkable. No pancreatic ductal dilatation or surrounding inflammatory changes. Spleen: Normal in size without significant abnormality. Adrenals/Urinary Tract: Adrenal glands are unremarkable. Severe bilateral hydronephrosis and hydroureter to the ureterovesicular junctions. There is severe urinary bladder wall thickening and distention of the urinary bladder. Stomach/Bowel: Stomach is within normal limits. Appendix appears normal. No evidence of bowel wall thickening, distention, or inflammatory changes. Vascular/Lymphatic: Aortic atherosclerosis. No enlarged abdominal or pelvic lymph nodes. Reproductive: TURP defect of the prostate. Other: There is a midline epigastric hernia containing multiple loops of nonobstructed small bowel (series 2, image 72). No abdominopelvic ascites. Musculoskeletal: No acute or significant osseous findings. IMPRESSION: 1. Large hiatal hernia with complete intrathoracic position of the stomach as well as portions of the pancreatic head and neck and multiple loops of nonobstructed small bowel. 2. Trace bilateral pleural effusions. Compressive atelectasis of the right lower lobe associated with a large hiatal hernia. 3. Severe bilateral hydronephrosis and hydroureter to the ureterovesicular junctions. There is severe urinary bladder wall thickening and distention of the urinary bladder. No obstructing calculi or other lesion identified. 4. TURP defect of the prostate. 5. There is a midline epigastric hernia containing multiple  loops of nonobstructed small bowel. 6. Coronary artery disease. Aortic Atherosclerosis (ICD10-I70.0). Electronically Signed   By: Eddie Candle M.D.   On: 11/24/2020 23:27   CT ABDOMEN PELVIS W CONTRAST  Result Date: 11/24/2020 CLINICAL DATA:  Hematemesis, suspect large hiatal hernia EXAM: CT CHEST, ABDOMEN, AND PELVIS WITH CONTRAST TECHNIQUE: Multidetector CT imaging of the chest, abdomen and pelvis was performed following the standard protocol during bolus administration of intravenous contrast. CONTRAST:  79mL OMNIPAQUE IOHEXOL 300 MG/ML SOLN, additional oral enteric contrast COMPARISON:  None. FINDINGS: CT CHEST FINDINGS Cardiovascular: Aortic atherosclerosis. Normal heart size. Scattered coronary artery calcifications. No pericardial effusion. Mediastinum/Nodes: No enlarged mediastinal, hilar, or axillary lymph nodes. Large hiatal hernia with complete intrathoracic position of the stomach as well as portions of the pancreatic head and neck and multiple loops of nonobstructed small bowel. Thyroid gland, trachea, and esophagus demonstrate no significant findings. Lungs/Pleura: Trace bilateral pleural effusions. Compressive atelectasis of the right lower lobe associated with a large hiatal hernia. Musculoskeletal: No chest wall mass or suspicious bone lesions identified. CT ABDOMEN PELVIS FINDINGS Hepatobiliary: No solid liver abnormality is seen. No gallstones, gallbladder wall thickening, or biliary dilatation. Pancreas: Unremarkable. No pancreatic ductal dilatation or surrounding inflammatory changes. Spleen: Normal in size without significant abnormality. Adrenals/Urinary Tract: Adrenal glands are unremarkable. Severe bilateral hydronephrosis and hydroureter to the ureterovesicular junctions. There is severe urinary bladder wall thickening and distention of the urinary bladder. Stomach/Bowel: Stomach is within normal limits. Appendix appears normal. No evidence of bowel wall thickening, distention, or  inflammatory changes. Vascular/Lymphatic: Aortic atherosclerosis. No enlarged abdominal or pelvic lymph nodes. Reproductive: TURP defect of the prostate. Other: There is a midline epigastric hernia containing multiple loops of nonobstructed  small bowel (series 2, image 72). No abdominopelvic ascites. Musculoskeletal: No acute or significant osseous findings. IMPRESSION: 1. Large hiatal hernia with complete intrathoracic position of the stomach as well as portions of the pancreatic head and neck and multiple loops of nonobstructed small bowel. 2. Trace bilateral pleural effusions. Compressive atelectasis of the right lower lobe associated with a large hiatal hernia. 3. Severe bilateral hydronephrosis and hydroureter to the ureterovesicular junctions. There is severe urinary bladder wall thickening and distention of the urinary bladder. No obstructing calculi or other lesion identified. 4. TURP defect of the prostate. 5. There is a midline epigastric hernia containing multiple loops of nonobstructed small bowel. 6. Coronary artery disease. Aortic Atherosclerosis (ICD10-I70.0). Electronically Signed   By: Eddie Candle M.D.   On: 11/24/2020 23:27    Scheduled Meds: . Chlorhexidine Gluconate Cloth  6 each Topical Daily  . feeding supplement  237 mL Oral BID BM  . levothyroxine  25 mcg Oral Q0600  . pantoprazole (PROTONIX) IV  40 mg Intravenous Q12H  . QUEtiapine  25 mg Oral BID  . tamsulosin  0.4 mg Oral Daily  . vitamin B-12  500 mcg Oral Daily   Continuous Infusions: . dextrose 5 % with KCl 20 mEq / L 75 mL/hr at 11/26/20 0308    LOS: 3 days   Kerney Elbe, DO [facility] PAGER is on AMION  If 7PM-7AM, please contact night-coverage [email or web address]

## 2020-11-26 NOTE — Progress Notes (Signed)
Spoke with dtg Anselmo Pickler, and gave her a nursing update, want to speak MD to determine medical plans for patient. SRP, RN

## 2020-11-26 NOTE — Progress Notes (Addendum)
Patient ID: [name], male   DOB: August 03, 1925, 87 y.o.   MRN: [id number]    Progress Note   Subjective   Day # 3  CC: hematemesis, huge hiatal hernia  Hgb 8.1 stable Pre alb 10.9 Creat 1.38  Patient is taking clear liquids without difficulty, denies any nausea or vomiting or hematemesis.  Denies any abdominal or chest pain. He knows he is being evaluated for surgery and says he thinks he is going to go through with it but wants to speak with his family and his attorney first.   Objective   Vital signs in last 24 hours: Temp:  [97.7 F (36.5 C)-98.6 F (37 C)] 98.6 F (37 C) (04/11 0610) Pulse Rate:  [40-51] 51 (04/11 0610) Resp:  [17-20] 17 (04/11 0610) BP: (127-140)/(62-68) 140/62 (04/11 0610) SpO2:  [98 %-100 %] 100 % (04/11 0610) Weight:  [67.1 kg-70.1 kg] 67.1 kg (04/10 1448) Last BM Date: 11/24/20 General: Very elderly male in NAD Heart:  Regular rate and rhythm; no murmurs Lungs: Respirations even and unlabored, lungs CTA bilaterally Abdomen:  Soft, nontender and nondistended. Normal bowel sounds. Extremities:  Without edema. Neurologic:  Alert and oriented,  grossly normal neurologically. Psych:  Cooperative. Normal mood and affect.  Intake/Output from previous day: 04/10 0701 - 04/11 0700 In: 1712.3 [P.O.:10; I.V.:1499.6; IV Piggyback:202.8] Out: 2200 [Urine:2200] Intake/Output this shift: No intake/output data recorded.  Lab Results: Recent Labs    11/24/20 1143 11/25/20 0334 11/25/20 1549 11/26/20 0332  WBC 11.9* 9.6  --  9.2  HGB 8.5* 7.6* 7.2* 8.1*  HCT 25.9* 24.1* 24.7* 24.8*  PLT 231 207  --  240   BMET Recent Labs    11/24/20 1143 11/25/20 0334 11/26/20 0332  NA 146* 139 134*  K 3.1* 3.4* 3.9  CL 118* 112* 109  CO2 22 20* 19*  GLUCOSE 100* 92 103*  BUN 61* 41* 24*  CREATININE 1.53* 1.41* 1.38*  CALCIUM 8.0* 7.6* 7.7*   LFT Recent Labs    11/26/20 0332  PROT 5.3*  ALBUMIN 2.5*  AST 14*  ALT 12  ALKPHOS 54  BILITOT 1.0    PT/INR No results for input(s): LABPROT, INR in the last 72 hours.  Studies/Results: CT CHEST W CONTRAST  Result Date: 11/24/2020 CLINICAL DATA:  Hematemesis, suspect large hiatal hernia EXAM: CT CHEST, ABDOMEN, AND PELVIS WITH CONTRAST TECHNIQUE: Multidetector CT imaging of the chest, abdomen and pelvis was performed following the standard protocol during bolus administration of intravenous contrast. CONTRAST:  30mL OMNIPAQUE IOHEXOL 300 MG/ML SOLN, additional oral enteric contrast COMPARISON:  None. FINDINGS: CT CHEST FINDINGS Cardiovascular: Aortic atherosclerosis. Normal heart size. Scattered coronary artery calcifications. No pericardial effusion. Mediastinum/Nodes: No enlarged mediastinal, hilar, or axillary lymph nodes. Large hiatal hernia with complete intrathoracic position of the stomach as well as portions of the pancreatic head and neck and multiple loops of nonobstructed small bowel. Thyroid gland, trachea, and esophagus demonstrate no significant findings. Lungs/Pleura: Trace bilateral pleural effusions. Compressive atelectasis of the right lower lobe associated with a large hiatal hernia. Musculoskeletal: No chest wall mass or suspicious bone lesions identified. CT ABDOMEN PELVIS FINDINGS Hepatobiliary: No solid liver abnormality is seen. No gallstones, gallbladder wall thickening, or biliary dilatation. Pancreas: Unremarkable. No pancreatic ductal dilatation or surrounding inflammatory changes. Spleen: Normal in size without significant abnormality. Adrenals/Urinary Tract: Adrenal glands are unremarkable. Severe bilateral hydronephrosis and hydroureter to the ureterovesicular junctions. There is severe urinary bladder wall thickening and distention of the urinary bladder. Stomach/Bowel: Stomach is  within normal limits. Appendix appears normal. No evidence of bowel wall thickening, distention, or inflammatory changes. Vascular/Lymphatic: Aortic atherosclerosis. No enlarged abdominal or pelvic  lymph nodes. Reproductive: TURP defect of the prostate. Other: There is a midline epigastric hernia containing multiple loops of nonobstructed small bowel (series 2, image 72). No abdominopelvic ascites. Musculoskeletal: No acute or significant osseous findings. IMPRESSION: 1. Large hiatal hernia with complete intrathoracic position of the stomach as well as portions of the pancreatic head and neck and multiple loops of nonobstructed small bowel. 2. Trace bilateral pleural effusions. Compressive atelectasis of the right lower lobe associated with a large hiatal hernia. 3. Severe bilateral hydronephrosis and hydroureter to the ureterovesicular junctions. There is severe urinary bladder wall thickening and distention of the urinary bladder. No obstructing calculi or other lesion identified. 4. TURP defect of the prostate. 5. There is a midline epigastric hernia containing multiple loops of nonobstructed small bowel. 6. Coronary artery disease. Aortic Atherosclerosis (ICD10-I70.0). Electronically Signed   By: Eddie Candle M.D.   On: 11/24/2020 23:27   CT ABDOMEN PELVIS W CONTRAST  Result Date: 11/24/2020 CLINICAL DATA:  Hematemesis, suspect large hiatal hernia EXAM: CT CHEST, ABDOMEN, AND PELVIS WITH CONTRAST TECHNIQUE: Multidetector CT imaging of the chest, abdomen and pelvis was performed following the standard protocol during bolus administration of intravenous contrast. CONTRAST:  71mL OMNIPAQUE IOHEXOL 300 MG/ML SOLN, additional oral enteric contrast COMPARISON:  None. FINDINGS: CT CHEST FINDINGS Cardiovascular: Aortic atherosclerosis. Normal heart size. Scattered coronary artery calcifications. No pericardial effusion. Mediastinum/Nodes: No enlarged mediastinal, hilar, or axillary lymph nodes. Large hiatal hernia with complete intrathoracic position of the stomach as well as portions of the pancreatic head and neck and multiple loops of nonobstructed small bowel. Thyroid gland, trachea, and esophagus  demonstrate no significant findings. Lungs/Pleura: Trace bilateral pleural effusions. Compressive atelectasis of the right lower lobe associated with a large hiatal hernia. Musculoskeletal: No chest wall mass or suspicious bone lesions identified. CT ABDOMEN PELVIS FINDINGS Hepatobiliary: No solid liver abnormality is seen. No gallstones, gallbladder wall thickening, or biliary dilatation. Pancreas: Unremarkable. No pancreatic ductal dilatation or surrounding inflammatory changes. Spleen: Normal in size without significant abnormality. Adrenals/Urinary Tract: Adrenal glands are unremarkable. Severe bilateral hydronephrosis and hydroureter to the ureterovesicular junctions. There is severe urinary bladder wall thickening and distention of the urinary bladder. Stomach/Bowel: Stomach is within normal limits. Appendix appears normal. No evidence of bowel wall thickening, distention, or inflammatory changes. Vascular/Lymphatic: Aortic atherosclerosis. No enlarged abdominal or pelvic lymph nodes. Reproductive: TURP defect of the prostate. Other: There is a midline epigastric hernia containing multiple loops of nonobstructed small bowel (series 2, image 72). No abdominopelvic ascites. Musculoskeletal: No acute or significant osseous findings. IMPRESSION: 1. Large hiatal hernia with complete intrathoracic position of the stomach as well as portions of the pancreatic head and neck and multiple loops of nonobstructed small bowel. 2. Trace bilateral pleural effusions. Compressive atelectasis of the right lower lobe associated with a large hiatal hernia. 3. Severe bilateral hydronephrosis and hydroureter to the ureterovesicular junctions. There is severe urinary bladder wall thickening and distention of the urinary bladder. No obstructing calculi or other lesion identified. 4. TURP defect of the prostate. 5. There is a midline epigastric hernia containing multiple loops of nonobstructed small bowel. 6. Coronary artery disease.  Aortic Atherosclerosis (ICD10-I70.0). Electronically Signed   By: Eddie Candle M.D.   On: 11/24/2020 23:27       Assessment / Plan:    #65 87 year old white male admitted with hematemesis found  secondary to a chronic proximal gastric ulcer related to huge hiatal hernia. No evidence for persistent bleeding, hemoglobin stable Continues on IV PPI twice daily Continue to trend hemoglobin and transfuse for hemoglobin 7.5 or less Okay to advance diet to full liquids, and start supplements twice daily  Patient is undergoing evaluation by surgery for potential repair as CT revealed complete intrathoracic position of the stomach as well as portions of the pancreatic head and neck and multiple loops of nonobstructed small bowel. He is to have cardiac consultation and also palliative care consult prior to final decision.  #2 severe bilateral hydronephrosis and hydroureter, severe urinary bladder wall thickening and distention  #3 umbilical hernia #4 chronic kidney disease #5 iron deficiency anemia secondary to #1 #6 cognitive impairment  GI will sign off, available if needed.    LOS: 3 days   Amy Esterwood PA-C 11/26/2020, 8:35 AM      Attending Physician Note   I have taken an interval history, reviewed the chart and examined the patient. I agree with the Advanced Practitioner's note, impression and recommendations. Pt is tolerating full liquids without difficulty. No recurrent GI bleeding.   * Intrathoracic stomach, loops of small bowel, a portion of the pancreas. Surgery recommending gastric reduction and gastropexy without hiatal hernia repair.   * Large gastric ulcer / Cameron ulcer. Continue PPI bid and above surgery to manage the ulcer.  Avoid NSAIDs. Unlikely this large [name] will fully heal without surgery. Patient is considering surgery. GI signing off and available as needed.    Lucio Edward, MD FACG [phone]

## 2020-11-26 NOTE — Care Management Important Message (Signed)
Important Message   Patient Details  IM Letter given to the Patient. Name: [name] MRN: [id number] Date of Birth: [birth year]   Medicare Important Message Given:  Yes     Kerin Salen 11/26/2020, 2:40 PM

## 2020-11-26 NOTE — TOC Initial Note (Signed)
Transition of Care [facility]) - Initial/Assessment Note    Patient Details  Name: [name] MRN: [id number] Date of Birth: 1925-04-19  Transition of Care (TOC) CM/SW Contact:    Joaquin Courts, RN Phone Number: 11/26/2020, 1:38 PM  Clinical Narrative:                 Patient from [facility], CM spoke with facility rep regarding possibility of return.  CM notes PT recommendation for SNF vs return to ALF if patient improves.  Per facility rep, patient would benefit from going to their short term rehab prior to returning to ALF.  CM discussed this patient, who states he is concerned about his ability mobilize and thinks that SNF may be a good idea, though patient would like to think about this and speak with his family before making final decision.  TOC will continue to follow.  Expected Discharge Plan: Skilled Nursing Facility Barriers to Discharge: Continued Medical Work up   Patient Goals and CMS Choice Patient states their goals for this hospitalization and ongoing recovery are:: to get better      Expected Discharge Plan and Services Expected Discharge Plan: [place]   Discharge Planning Services: CM Consult   Living arrangements for the past 2 months: [place]                                      Prior Living Arrangements/Services Living arrangements for the past 2 months: [place] Lives with:: Facility Resident Patient language and need for interpreter reviewed:: Yes Do you feel safe going back to the place where you live?: Yes            Criminal Activity/Legal Involvement Pertinent to Current Situation/Hospitalization: No - Comment as needed  Activities of Daily Living Home Assistive Devices/Equipment: None ADL Screening (condition at time of admission) Patient's cognitive ability adequate to safely complete daily activities?: Yes Is the patient deaf or have difficulty hearing?: No Does the patient  have difficulty seeing, even when wearing glasses/contacts?: No Does the patient have difficulty concentrating, remembering, or making decisions?: No Patient able to express need for assistance with ADLs?: Yes Does the patient have difficulty dressing or bathing?: No Independently performs ADLs?: Yes (appropriate for developmental age) Does the patient have difficulty walking or climbing stairs?: No Weakness of Legs: Both Weakness of Arms/Hands: None  Permission Sought/Granted                  Emotional Assessment       Orientation: : Oriented to Self,Oriented to Place,Oriented to  Time,Oriented to Situation   Psych Involvement: No (comment)  Admission diagnosis:  Acute GI bleeding [K92.2] Patient Active Problem List   Diagnosis Date Noted  . Incarcerated giant hiatal hernia 11/25/2020  . HOH (hard of hearing) 11/25/2020  . Scoliosis of thoracolumbar region due to degenerative disease of spine in adult 11/25/2020  . Right groin mass c/w sebaceous cyst 11/25/2020  . Cameron ulcer, acute with bleeding & anemia 11/25/2020  . IDA (iron deficiency anemia) 11/25/2020  . Uses roller walker 11/25/2020  . HTN (hypertension) 11/24/2020  . Hypothyroidism 11/24/2020  . Cognitive impairment 11/24/2020  . Acute GI bleeding from [facility] ulcerations in giant hiatal hernia 11/23/2020  . Umbilical hernia - reducible 06/20/2019  . CKD (chronic kidney disease), stage III ([place]) 02/03/2019  . H/O sinus bradycardia 11/20/2017  . Senile  osteoporosis 11/20/2017  . Other idiopathic scoliosis, thoracolumbar region 11/20/2017  . Continuous leakage of urine 11/20/2017  . Protein-calorie malnutrition, moderate ([place]) 02/28/2014  . Chronic low back pain 02/26/2014  . Elevated LFTs 02/26/2014  . Alcohol use 02/26/2014  . Generalized weakness 02/26/2014  . GERD (gastroesophageal reflux disease) 08/13/2011  . Lumbar disc disease 08/13/2011   PCP:  Gayland Curry, DO Pharmacy:   [facility], [place] [address] [address] Phone: [phone] Fax: [phone]     Social Determinants of Health (SDOH) Interventions    Readmission Risk Interventions No flowsheet data found.

## 2020-11-27 ENCOUNTER — Inpatient Hospital Stay (HOSPITAL_COMMUNITY): Payer: Medicare Other

## 2020-11-27 DIAGNOSIS — I340 Nonrheumatic mitral (valve) insufficiency: Secondary | ICD-10-CM | POA: Diagnosis not present

## 2020-11-27 DIAGNOSIS — Z9989 Dependence on other enabling machines and devices: Secondary | ICD-10-CM

## 2020-11-27 DIAGNOSIS — Z01810 Encounter for preprocedural cardiovascular examination: Secondary | ICD-10-CM | POA: Diagnosis not present

## 2020-11-27 DIAGNOSIS — I10 Essential (primary) hypertension: Secondary | ICD-10-CM | POA: Diagnosis not present

## 2020-11-27 DIAGNOSIS — Z01818 Encounter for other preprocedural examination: Secondary | ICD-10-CM | POA: Diagnosis not present

## 2020-11-27 DIAGNOSIS — K922 Gastrointestinal hemorrhage, unspecified: Secondary | ICD-10-CM | POA: Diagnosis not present

## 2020-11-27 LAB — CBC WITH DIFFERENTIAL/PLATELET
Abs Immature Granulocytes: 0.03 10*3/uL (ref 0.00–0.07)
Basophils Absolute: 0 10*3/uL (ref 0.0–0.1)
Basophils Relative: 0 %
Eosinophils Absolute: 0.2 10*3/uL (ref 0.0–0.5)
Eosinophils Relative: 3 %
HCT: 26.1 % — ABNORMAL LOW (ref 39.0–52.0)
Hemoglobin: 8.4 g/dL — ABNORMAL LOW (ref 13.0–17.0)
Immature Granulocytes: 0 %
Lymphocytes Relative: 23 %
Lymphs Abs: 1.9 10*3/uL (ref 0.7–4.0)
MCH: 29.4 pg (ref 26.0–34.0)
MCHC: 32.2 g/dL (ref 30.0–36.0)
MCV: 91.3 fL (ref 80.0–100.0)
Monocytes Absolute: 1.2 10*3/uL — ABNORMAL HIGH (ref 0.1–1.0)
Monocytes Relative: 14 %
Neutro Abs: 5.1 10*3/uL (ref 1.7–7.7)
Neutrophils Relative %: 60 %
Platelets: 265 10*3/uL (ref 150–400)
RBC: 2.86 MIL/uL — ABNORMAL LOW (ref 4.22–5.81)
RDW: 15.9 % — ABNORMAL HIGH (ref 11.5–15.5)
WBC: 8.4 10*3/uL (ref 4.0–10.5)
nRBC: 0 % (ref 0.0–0.2)

## 2020-11-27 LAB — HEMOGLOBIN AND HEMATOCRIT, BLOOD
HCT: 23.8 % — ABNORMAL LOW (ref 39.0–52.0)
Hemoglobin: 7.6 g/dL — ABNORMAL LOW (ref 13.0–17.0)

## 2020-11-27 LAB — COMPREHENSIVE METABOLIC PANEL
ALT: 14 U/L (ref 0–44)
AST: 16 U/L (ref 15–41)
Albumin: 2.5 g/dL — ABNORMAL LOW (ref 3.5–5.0)
Alkaline Phosphatase: 60 U/L (ref 38–126)
Anion gap: 7 (ref 5–15)
BUN: 21 mg/dL (ref 8–23)
CO2: 21 mmol/L — ABNORMAL LOW (ref 22–32)
Calcium: 7.9 mg/dL — ABNORMAL LOW (ref 8.9–10.3)
Chloride: 106 mmol/L (ref 98–111)
Creatinine, Ser: 1.29 mg/dL — ABNORMAL HIGH (ref 0.61–1.24)
GFR, Estimated: 52 mL/min — ABNORMAL LOW (ref >60)
Glucose, Bld: 97 mg/dL (ref 70–99)
Potassium: 4 mmol/L (ref 3.5–5.1)
Sodium: 134 mmol/L — ABNORMAL LOW (ref 135–145)
Total Bilirubin: 0.9 mg/dL (ref 0.3–1.2)
Total Protein: 5.6 g/dL — ABNORMAL LOW (ref 6.5–8.1)

## 2020-11-27 LAB — ECHOCARDIOGRAM COMPLETE
AR max vel: 2.43 cm2
AV Area VTI: 2.88 cm2
AV Area mean vel: 2.53 cm2
AV Mean grad: 4 mmHg
AV Peak grad: 8.6 mmHg
Ao pk vel: 1.47 m/s
Area-P 1/2: 2.37 cm2
Height: 68 in
S' Lateral: 2.9 cm
Weight: 2368 oz

## 2020-11-27 LAB — MAGNESIUM: Magnesium: 2 mg/dL (ref 1.7–2.4)

## 2020-11-27 LAB — HEMOGLOBIN A1C
Hgb A1c MFr Bld: 5.6 % (ref 4.8–5.6)
Mean Plasma Glucose: 114.02 mg/dL

## 2020-11-27 LAB — SURGICAL PCR SCREEN
MRSA, PCR: NEGATIVE
Staphylococcus aureus: NEGATIVE

## 2020-11-27 LAB — PHOSPHORUS: Phosphorus: 3.3 mg/dL (ref 2.5–4.6)

## 2020-11-27 MED ORDER — MELATONIN 5 MG PO TABS
5.0000 mg | ORAL_TABLET | Freq: Once | ORAL | Status: AC
  Administered 2020-11-27: 5 mg via ORAL
  Filled 2020-11-27: qty 1

## 2020-11-27 MED ORDER — QUETIAPINE FUMARATE 25 MG PO TABS: 25.0000 mg | ORAL_TABLET | Freq: Once | ORAL | Status: DC

## 2020-11-27 MED ORDER — CHLORHEXIDINE GLUCONATE CLOTH 2 % EX PADS
6.0000 | MEDICATED_PAD | Freq: Every day | CUTANEOUS | Status: DC
  Administered 2020-11-28 – 2020-11-30 (×2): 6 via TOPICAL

## 2020-11-27 NOTE — Anesthesia Preprocedure Evaluation (Addendum)
Anesthesia Evaluation  Patient identified by MRN, date of birth, ID band Patient awake    Reviewed: Allergy & Precautions, NPO status , Patient's Chart, lab work & pertinent test results  Airway Mallampati: II  TM Distance: >3 FB Neck ROM: Limited    Dental no notable dental hx.    Pulmonary neg pulmonary ROS, former smoker,    Pulmonary exam normal breath sounds clear to auscultation       Cardiovascular hypertension, Normal cardiovascular exam Rhythm:Regular Rate:Normal  1. Left ventricular ejection fraction, by estimation, is 45 to 50%. The  left ventricle has mildly decreased function. The left ventricle has no  regional wall motion abnormalities. There is mild left ventricular  hypertrophy. Left ventricular diastolic  parameters are consistent with Grade I diastolic dysfunction (impaired  relaxation).  2. Right ventricular systolic function is normal. The right ventricular  size is normal. There is normal pulmonary artery systolic pressure.  3. The mitral valve is grossly normal. Mild mitral valve regurgitation.  4. The aortic valve is normal in structure. Aortic valve regurgitation is  not visualized. No aortic stenosis is present.  5. Aortic dilatation noted. There is mild dilatation of the ascending  aorta, measuring 41 mm.    Neuro/Psych negative neurological ROS  negative psych ROS   GI/Hepatic Neg liver ROS, GERD  ,  Endo/Other  Hypothyroidism   Renal/GU Renal InsufficiencyRenal diseaseLab Results      Component                Value               Date                      CREATININE               1.29 (H)            11/27/2020                BUN                      21                  11/27/2020                NA                       134 (L)             11/27/2020                K                        4.0                 11/27/2020                CL                       106                 11/27/2020                 CO2                      21 (L)  11/27/2020             negative genitourinary   Musculoskeletal negative musculoskeletal ROS (+) Arthritis ,   Abdominal   Peds negative pediatric ROS (+)  Hematology  (+) anemia , Lab Results      Component                Value               Date                      WBC                      8.4                 11/27/2020                HGB                      7.6 (L)             11/27/2020                HGB                      8.4 (L)             11/27/2020                HCT                      23.8 (L)            11/27/2020                HCT                      26.1 (L)            11/27/2020                MCV                      91.3                11/27/2020                PLT                      265                 11/27/2020              Anesthesia Other Findings   Reproductive/Obstetrics negative OB ROS                           Anesthesia Physical Anesthesia Plan  ASA: III  Anesthesia Plan: General   Post-op Pain Management:    Induction: Intravenous  PONV Risk Score and Plan: 3 and Treatment may vary due to age or medical condition and Ondansetron  Airway Management Planned: Oral ETT  Additional Equipment: None  Intra-op Plan:   Post-operative Plan: Extubation in OR  Informed Consent:     Dental advisory given  Plan Discussed with: Surgeon  Anesthesia Plan Comments:        Anesthesia Quick Evaluation

## 2020-11-27 NOTE — Progress Notes (Signed)
  Echocardiogram 2D Echocardiogram has been performed.  [name] 11/27/2020, 4:36 PM

## 2020-11-27 NOTE — Progress Notes (Signed)
Physical Therapy Treatment Patient Details Name: Katelyn Kohlmeyer MRN: [id number] DOB: 04-07-1925 Today's Date: 11/27/2020    History of Present Illness 87yo male admitted with N/V/D, hiatal hernia, GI bleed. Hx of chronic back pain, OA, falls, shingles, mild memory deficits, BPH, CKD, osteoporosis    PT Comments    Overall, Mod A for mobility on today. Pt is weak and unsteady. He is at risk for falls when mobilizing. Continue to recommend ST SNF.    Follow Up Recommendations  SNF     Equipment Recommendations  None recommended by PT    Recommendations for Other Services       Precautions / Restrictions Precautions Precautions: Fall Restrictions Weight Bearing Restrictions: No    Mobility  Bed Mobility Overal bed mobility: Needs Assistance Bed Mobility: Supine to Sit;Sit to Supine     Supine to sit: Mod assist Sit to supine: Min assist   General bed mobility comments: Assist for LEs and to scoot to EOB. Increased time. Cues required.    Transfers Overall transfer level: Needs assistance Equipment used: 4-wheeled walker Transfers: Sit to/from Stand Sit to Stand: Mod assist         General transfer comment: Increased time and steadying assistance required. Cues for safety, hand placement, safe operation of rollator (pt stated he uses a rollator at ALF)  Ambulation/Gait Ambulation/Gait assistance: Mod assist Gait Distance (Feet): 40 Feet Assistive device: Rolling walker (2 wheeled) Gait Pattern/deviations: Decreased stride length;Trunk flexed;Decreased step length - left;Decreased step length - right;Step-to pattern     General Gait Details: Mod A initially but progressed to Min A as distance increased. Unsteady and at risk for falls. R LE tends to lag behind. Pt able to correct when cued but he does not consistently maintain correction. Cues for safety, posture, RW proximity, and step length.   Stairs             Wheelchair Mobility    Modified Rankin  (Stroke Patients Only)       Balance Overall balance assessment: Needs assistance         Standing balance support: Bilateral upper extremity supported Standing balance-Leahy Scale: Poor                              Cognition Arousal/Alertness: Awake/alert Behavior During Therapy: WFL for tasks assessed/performed Overall Cognitive Status: Within Functional Limits for tasks assessed                                 General Comments: mild memory deficits as reported in chart. follows commands well.      Exercises Total Joint Exercises Ankle Circles/Pumps: AROM;Both;10 reps Heel Slides: AROM;Both;10 reps Straight Leg Raises: AROM;Both;10 reps    General Comments        Pertinent Vitals/Pain Pain Assessment: Faces Faces Pain Scale: Hurts little more Pain Location: back Pain Descriptors / Indicators: Discomfort;Sore Pain Intervention(s): Monitored during session;Repositioned    Home Living                      Prior Function            PT Goals (current goals can now be found in the care plan section) Progress towards PT goals: Progressing toward goals    Frequency    Min 2X/week      PT Plan Frequency needs to be updated  Co-evaluation              AM-PAC PT "6 Clicks" Mobility   Outcome Measure  Help needed turning from your back to your side while in a flat bed without using bedrails?: A Lot Help needed moving from lying on your back to sitting on the side of a flat bed without using bedrails?: A Lot Help needed moving to and from a bed to a chair (including a wheelchair)?: A Lot Help needed standing up from a chair using your arms (e.g., wheelchair or bedside chair)?: A Lot Help needed to walk in hospital room?: A Lot Help needed climbing 3-5 steps with a railing? : A Lot 6 Click Score: 12    End of Session Equipment Utilized During Treatment: Gait belt Activity Tolerance: Patient limited by  fatigue Patient left: in bed;with call bell/phone within reach;with bed alarm set   PT Visit Diagnosis: Muscle weakness (generalized) (M62.81);Difficulty in walking, not elsewhere classified (R26.2);History of falling (Z91.81)     Time: 2244-9753 PT Time Calculation (min) (ACUTE ONLY): 16 min  Charges:  $Gait Training: 8-22 mins                         Doreatha Massed, PT Acute Rehabilitation  Office: [phone] Pager: [phone]

## 2020-11-27 NOTE — Progress Notes (Addendum)
PROGRESS NOTE    [name]  TZG:[id number] DOB: 08-05-1925 DOA: 11/23/2020 PCP: Gregory Curry, DO   Brief Narrative:  Gregory patient is a 87 year old elderly Caucasian male with a past medical history significant for but not limited to cognitive impairment, chronic back pain, chronic kidney disease stage IIIb, hypothyroidism, GERD as well as other comorbidities who presented with concerns of coffee-ground emesis.  He was unable to provide a subjective history given his cognitive impairment but reportedly per documentation he had been sent over from his SNF after Gregory staff had noticed coffee-ground emesis.  Patient denies any pain and stated that he felt fine and states that only saw some dark substances in his vomit but not sure that it was bloody.  In Gregory ED he is found to be briefly tachycardic that resolved spontaneously and patient was normotensive on room air.  His hemoglobin was noted to be 6.5 down from a baseline of 11-12.  Initially had a lactic of 2.8 and FOBT was positive.  Gregory EDP message to [name] for consultation and patient was ordered 2 units of PRBCs and started on IV pantoprazole infusion.  He underwent EGD this morning and it showed a very abnormal upper [name] anatomy with an apparent large prolapsing hiatal hernia with a large deep not circumferential ulcer at Gregory GE junction versus Gregory proximal stomach as well as a dark eschar at Gregory ulcer site.  There is no evidence of any active bleeding however Gregory surrounding mucosa was edematous and friable and somewhat neoplastic appearing however not firm on forcep biopsies.  Gregory [name] physician recommended to await Gregory pathology results and they are ordering a CT chest/abdomen/pelvis to get a better idea of his anatomy and they are recommending clear liquid diet for now as well as IV PPI twice daily.  CT of Gregory chest abdomen pelvis was done large hiatal hernia with complete intrathoracic position in Gregory stomach as well as portions of Gregory pancreatic head  and neck and multiple loops of nonobstructed small bowel.  Trace bilateral pleural effusions noted and compressive atelectasis of Gregory right lower lobe associated with large hiatal hernia.  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  There is also a midline epigastric hernia containing multiple loops of nonobstructive small bowel and patient was noted to have CAD as well.  Gastroenterology recommends continuing on a clear liquid diet and feel that Gregory patient is hiatal hernia is very large and needs surgical repair and Dr. Johney [facility] of [name] has evaluated.  [name] recommends cycling H&H every 12 hours for 4 occurrences and transfusion as needed less than 7. Pope Surgery feels that Gregory standard of care would be considering a reduction repair of his large hiatal hernia given his advanced age and some deconditioning chronic malnutrition they are recommending medical and cardiac clearance.  They feel he would benefit from a feeding gastrostomy tube perioperatively and Dr. Johney Pope feels that he has some dysphagia issues.  Dr. Rosendo Pope to evaluate tomorrow but palliative care has also been consulted for further goals of care discussion  Given his findings on CT scan [name] was consulted and they recommended Foley catheter placement initially patient refused and then was agreeable so this was placed to help decompression of Gregory hydronephrosis.  Patient and family  leaning towards surgery for gastric reduction and gastropexy so cardiology was consulted for further evaluation recommendations for preoperative clearance and have ordered an EKG.  They feel he will be at moderate risk for Pope anesthesia/surgery and that he may proceed from a cardiac perspective without any  further testing.  Because his blood pressures are still there we will continue to monitor and they have ordered an echocardiogram for preoperative risk assessment.  Assessment & Plan:   Principal Problem:   Acute [name] bleeding from [name] ulcerations in giant hiatal hernia Active Problems:   Protein-calorie malnutrition, moderate (HCC)   CKD (chronic kidney disease), stage III (HCC)   Umbilical hernia - reducible   HTN (hypertension)   Hypothyroidism   Cognitive impairment   Incarcerated giant hiatal hernia   HOH (hard of hearing)   Scoliosis of thoracolumbar region due to degenerative disease of spine in adult   Right groin mass c/w sebaceous cyst   [name] ulcer, acute with bleeding & anemia   IDA (iron deficiency anemia)   Uses roller walker  Acute blood loss anemia from upper [name] bleed -Patient presented with a hemoglobin of 6.5 and a hematocrit of 20.9.  Positive FOBT. -Last colonoscopy done in 2012 with no polyps or other findings -He was typed and screened and transfused 2 unit PRBCs on 4/8; after his transfusion his hemoglobin/hematocrit improved to 8.5/25.9 yesterday and dropped to 7.6/24.1 -> 7.2/24.7 -> 8.1/24.8 and today is 7.6/23.8 -Continued IV PPI infusion per [name] recommendations -[name] evaluated and recommended an EGD and patient was taken down and EGD showed a very abnormal upper [name] anatomy with an apparent large prolapsing hiatal hernia with a large deep not circumferential ulcer at Gregory GE junction versus Gregory proximal stomach as well as a dark eschar at Gregory ulcer site.  There is no evidence of any active bleeding however Gregory surrounding mucosa was edematous and friable and somewhat neoplastic appearing however not firm on forcep biopsies.   -Gregory [name] Team recommended to await Gregory pathology results and they are ordering a CT chest/abdomen/pelvis to get a better idea of his anatomy and they are recommending clear liquid diet for now but advancing to [facility] as well as IV PPI twice  daily. -CT scan was done and showed a large hiatal hernia with complete intrathoracic position in Gregory stomach as well as portions of Gregory pancreatic head and neck and multiple loops of nonobstructed small bowel.  Trace bilateral pleural effusions noted and compressive atelectasis of Gregory right lower lobe associated with large hiatal hernia.  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  There is also a midline epigastric hernia containing multiple loops of nonobstructive small bowel and patient was noted to have CAD as well.  -[name] evaluated and recommended a clear liquid diet initially but now they have advance him to full liquid diet yesterday -[facility] Surgery re-evaluated today due to gastric reduction and gastropexy to help resolve [place] ulcers or [name] bleed but due to his advanced age and comorbidities they do not recommend a hiatal hernia repair -Continue to monitor for signs and symptoms of bleeding and follow H/H q12 x4 and repeat CBC in Gregory a.m. -Patient's Pre-Albumin was 11.7 -> 10.9 -Palliative Care Consulted for further [place] Discussion but patient's family and Gregory patient himself  are leaning toward surgical intervention now -Patient is currently leaning toward surgery and wants to discuss with his daughter and has healthcare power of attorney but if he is agreeable to surgery this will be done on Tuesday or Wednesday  -Cardiology consulted for preoperative clearance in case Gregory patient does go for surgical intervention and they have ordered an EKG and a echocardiogram but feel that he can proceed with surgical intervention from a cardiac perspective  Lactic Acidosis  -Presented with a lactic acid of 2.2 and this is now further trended down resolved to  1.0 -He was given 2 units of PRBCs  -IV fluid hydration with D5W + 20 mEQ of KCl at 75 mL's per hour now stopped  Bilateral Hydronephrosis in Gregory setting of Urinary Retention -[name] consulted given findings on CT scan as above -Dr. Claudia Desanctis discussed placement of indwelling Foley catheter to help decompress Gregory patient's hydronephrosis however Gregory patient is asymptomatic and kidney function is at baseline; initially was unsure whether he would like Gregory Foley catheter not and after discussion with his healthcare power of attorney they were agreeable until Foley catheter was placed -Dr. Claudia Desanctis is unsure that placing a Foley catheter improve his clinical status and he has no evidence of acute on chronic renal failure infection or discomfort and had recommended observation but Foley catheter was placed -Continue Foley catheter care and repeat a renal ultrasound and next 48 hours  CKD stage 3b, improving  Metabolic acidosis,  -Patient's BUNs/creatinine went from 65/1.62 and is now trended down to 61/1.53 -> 41/1.41 and improved to 24/1.38 and today is 21/1.29 -IV fluid hydration is now stopped -Patient has a slight acidosis with a CO2 of 21, anion gap 7, chloride level of 106 -Foley Catether has been placed and draining well  -Avoid nephrotoxic medications, contrast dyes, hypotension and renally dose medications -Repeat CMP in a.m.  Hypernatremia -> Hyponatremia  -Patient's sodium went from 140 is now 146 -> 139 and today was 134 -IV fluid hydration is now stopped -Continue monitor and trend and repeat CMP in a.m.  Hypokalemia -Patient's potassium this morning was 3.1 -> 3.4 -> 3.9 -Replete with po KCl 40 mEQ x1 and IV K Phos 10 mmol and IVF with D5W +20 mEq of KCl at 75 mL's per hour now stopped  -Checked magnesium level and was 2.0 We will continue monitor and replete as necessary -Repeat CMP in Gregory a.m.  Hypophosphatemia -Patient's Phos Level was 2.3 -> 2.6 and today it was 3.3 -Continue  to Monitor and Replete as Necessary  -Repeat Phos Level in Gregory AM   Hyperglycemia  -Patient blood sugar this morning was 100 on BMP and yesterday was 103 on CMP and today it is 97 -Check hemoglobin A1c and was 5.6 -Continue monitor blood sugars carefully and if necessary will place on Sensitive Novolog SSI AC  Hypothyroidism -Continue Levothyroxine 25 mcg po Daily -Checked TSH and was 3.249  Hypertension -Continue to Hold amlodipine for now given significant anemia and blood pressures  Cognitive impairment -C/w Quetiapine 25 mg po BID   Nonsevere (Moderate) Malnutrition in Gregory Context of Chronic Illness -Nutritionist consulted for further evaluation recommendations  -They are recommending Ensure Enlive twice daily  GOC: DNR,poA -Palliative Consulted for further evaluation and recommendations   DVT prophylaxis: SCDs Code Status: DO NOT RESUSCITATE  Family Communication: No family present at bedside  Disposition Plan: Pending further clinical Improvement and clearance by Gastroenterology, [facility] Surgery, and evaluation by Cardiology  Status is: Inpatient  Remains inpatient appropriate because:Unsafe d/c plan, IV treatments appropriate due to intensity of illness or inability to take PO and Inpatient level of care appropriate due to severity of illness   Dispo: Gregory patient is from: ALF              Anticipated d/c is to: SNF              Patient currently is not medically stable to d/c.   Difficult to place patient No  Consultants:   Gastroenterology  [facility] Surgery  Palliative Care Medicine   Cardiology   Procedures:  EGD Findings:      Very abnormal UGI anatomy with apparent large, prolapsing hiatal hernia.       I suspect much of his stomach is above Gregory diaphragm.      Large, deep, non-cirumferential ulcer (3-4cm across) at GE junction vs.       proximal stomach (difficult to tell exact location given Gregory abnormal       anatomy mentioned above. There is  dark eschar at Gregory ulcer site. There       is no active bleeding. Gregory surrounding mucosa is edematous, friable,       somewhat neoplastic appearing however it was not firm on forcep biopsies.      Gregory exam was otherwise without abnormality. Impression:               - Very abnormal UGI anatomy with apparent large,                            prolapsing hiatal hernia. I suspect much of his                            stomach is above Gregory diaphragm.                           - Large, deep, non-cirumferential ulcer (3-4cm                            across) at GE junction vs. proximal stomach                            (difficult to tell exact location given Gregory                            abnormal anatomy mentioned above. There is dark                            eschar at Gregory ulcer site. There is no active                            bleeding. Gregory surrounding mucosa is edematous,                            friable, somewhat neoplastic appearing however it                            was not firm on forcep biopsies. Moderate Sedation:      Not Applicable - Patient  had care per Anesthesia. Recommendation:           - Await pathology results.                           - I am ordering a CT scan chest, abd, pelvis to get                            a better idea of his anatomy.                           - Clear liquid diet for now.                           - IV PPI BID  Antimicrobials:  Anti-infectives (From admission, onward)   None        Subjective: Seen and examined at bedside he was feeling well and denied any pain.  No nausea or vomiting.  Okay with proceeding with surgery.  No chest pain, shortness of breath.  States Gregory surgery has not been any at this point.  No other concerns or complaints at this time.  Objective: Vitals:   11/26/20 0610 11/26/20 1331 11/26/20 2031 11/27/20 0436  BP: 140/62 (!) 141/60 138/80 98/68  Pulse: (!) 51 (!) 42 71 68  Resp: 17 17 17 18   Temp: 98.6 F (37  C) 98.2 F (36.8 C) 97.9 F (36.6 C) 98.2 F (36.8 C)  TempSrc:  Oral Oral Oral  SpO2: 100% 100% 100% 97%  Weight:      Height:        Intake/Output Summary (Last 24 hours) at 11/27/2020 1428 Last data filed at 11/27/2020 0500 Gross per 24 hour  Intake 20 ml  Output 1600 ml  Net -1580 ml   Filed Weights   11/25/20 1256 11/25/20 1448  Weight: 70.1 kg 67.1 kg   Examination: Physical Exam:  Constitutional: WN/WD elderly Caucasian male currently in NAD and appears calm and comfortable Eyes: Lids and conjunctivae normal, sclerae anicteric  ENMT: External Ears, Nose appear normal. Hard of hearing.  Neck: Appears normal, supple, no cervical masses, normal ROM, no appreciable thyromegaly; no JVD Respiratory: Diminished to auscultation bilaterally, no wheezing, rales, rhonchi or crackles. Normal respiratory effort and patient is not tachypenic. No accessory muscle use.  Not wearing supplemental oxygen nasal cannula Cardiovascular: RRR, no murmurs / rubs / gallops. S1 and S2 auscultated. No extremity edema Abdomen: Soft, non-tender, non-distended. Bowel sounds positive.  GU: Deferred. Musculoskeletal: No clubbing / cyanosis of digits/nails. No joint deformity upper and lower extremities. Good ROM, no contractures. Normal strength and muscle tone.  Skin: No rashes, lesions, ulcers on limited skin evaluation. No induration; Warm and dry.  Neurologic: CN 2-12 grossly intact with no focal deficits. Romberg sign and cerebellar reflexes not assessed.  Psychiatric: Normal judgment and insight. Alert and oriented x 3. Normal mood and appropriate affect.   Data Reviewed: I have personally reviewed following labs and imaging studies  CBC: Recent Labs  Lab 11/23/20 1859 11/24/20 1143 11/25/20 0334 11/25/20 1549 11/26/20 0332 11/27/20 0327  WBC 12.8* 11.9* 9.6  --  9.2 8.4  NEUTROABS 9.6*  --  5.9  --  5.7 5.1  HGB 6.5* 8.5* 7.6* 7.2* 8.1* 7.6*  8.4*  HCT 20.9* 25.9* 24.1* 24.7* 24.8*  23.8*  26.1*  MCV  90.5 89.3 92.3  --  90.8 91.3  PLT 274 231 207  --  240 494   Basic Metabolic Panel: Recent Labs  Lab 11/23/20 1859 11/24/20 1143 11/25/20 0334 11/26/20 0332 11/27/20 0327  NA 140 146* 139 134* 134*  K 3.6 3.1* 3.4* 3.9 4.0  CL 110 118* 112* 109 106  CO2 20* 22 20* 19* 21*  GLUCOSE 138* 100* 92 103* 97  BUN 65* 61* 41* 24* 21  CREATININE 1.62* 1.53* 1.41* 1.38* 1.29*  CALCIUM 7.8* 8.0* 7.6* 7.7* 7.9*  MG  --  1.9 1.7 1.7 2.0  PHOS  --  2.4* 2.3* 2.6 3.3   GFR: Estimated Creatinine Clearance: 34 mL/min (A) (by C-G formula based on SCr of 1.29 mg/dL (H)). Liver Function Tests: Recent Labs  Lab 11/23/20 1859 11/25/20 0334 11/26/20 0332 11/27/20 0327  AST 20 16 14* 16  ALT 18 13 12 14   ALKPHOS 58 50 54 60  BILITOT 0.9 0.9 1.0 0.9  PROT 6.1* 5.2* 5.3* 5.6*  ALBUMIN 2.9* 2.5* 2.5* 2.5*   Recent Labs  Lab 11/23/20 1859  LIPASE 28   No results for input(s): AMMONIA in Gregory last 168 hours. Coagulation Profile: No results for input(s): INR, PROTIME in Gregory last 168 hours. Cardiac Enzymes: No results for input(s): CKTOTAL, CKMB, CKMBINDEX, TROPONINI in Gregory last 168 hours. BNP (last 3 results) No results for input(s): PROBNP in Gregory last 8760 hours. HbA1C: Recent Labs    11/26/20 0332  HGBA1C 5.6   CBG: No results for input(s): GLUCAP in Gregory last 168 hours. Lipid Profile: No results for input(s): CHOL, HDL, LDLCALC, TRIG, CHOLHDL, LDLDIRECT in Gregory last 72 hours. Thyroid Function Tests: Recent Labs    11/26/20 0332  TSH 3.249   Anemia Panel: No results for input(s): VITAMINB12, FOLATE, FERRITIN, TIBC, IRON, RETICCTPCT in Gregory last 72 hours. Sepsis Labs: Recent Labs  Lab 11/23/20 1900 11/23/20 2125 11/24/20 0200 11/24/20 1143  LATICACIDVEN 2.8* 2.2* 1.8 1.0    Recent Results (from Gregory past 240 hour(s))  SARS Coronavirus 2 by RT PCR (hospital order, performed in [facility] hospital lab)     Status: None   Collection Time: 11/23/20   9:33 PM  Result Value Ref Range Status   SARS Coronavirus 2 NEGATIVE NEGATIVE Final    Comment: (NOTE) SARS-CoV-2 target nucleic acids are NOT DETECTED.  Gregory SARS-CoV-2 RNA is generally detectable in upper and lower respiratory specimens during Gregory acute phase of infection. Gregory lowest concentration of SARS-CoV-2 viral copies this assay can detect is 250 copies / mL. A negative result does not preclude SARS-CoV-2 infection and should not be used as Gregory sole basis for treatment or other patient management decisions.  A negative result may occur with improper specimen collection / handling, submission of specimen other than nasopharyngeal swab, presence of viral mutation(s) within Gregory areas targeted by this assay, and inadequate number of viral copies (<250 copies / mL). A negative result must be combined with clinical observations, patient history, and epidemiological information.  Fact Sheet for Patients:   StrictlyIdeas.no  Fact Sheet for Healthcare Providers: BankingDealers.co.za  This test is not yet approved or  cleared by Gregory [place] FDA and has been authorized for detection and/or diagnosis of SARS-CoV-2 by FDA under an Emergency Use Authorization (EUA).  This EUA will remain in effect (meaning this test can be used) for Gregory duration of Gregory COVID-19 declaration under Section 564(b)(1) of Gregory Act, 21 U.S.C. section 360bbb-3(b)(1), unless Gregory authorization is  terminated or revoked sooner.  Performed at [facility], [place] [address], [place], [place] [address]      RN Pressure Injury Documentation:     Estimated body mass index is 22.5 kg/m as calculated from Gregory following:   Height as of this encounter: 5\' 8"  (1.727 m).   Weight as of this encounter: 67.1 kg.  Malnutrition Type: Nutrition Problem: Moderate Malnutrition Etiology: chronic illness Malnutrition Characteristics: Signs/Symptoms: mild  fat depletion,mild muscle depletion,moderate muscle depletion Nutrition Interventions: Interventions: Ensure Enlive (each supplement provides 350kcal and 20 grams of protein)  Radiology Studies: No results found.  Scheduled Meds: . Chlorhexidine Gluconate Cloth  6 each Topical Daily  . feeding supplement  237 mL Oral BID BM  . levothyroxine  25 mcg Oral Q0600  . pantoprazole (PROTONIX) IV  40 mg Intravenous Q12H  . QUEtiapine  25 mg Oral BID  . tamsulosin  0.4 mg Oral Daily  . vitamin B-12  500 mcg Oral Daily   Continuous Infusions:   LOS: 4 days   Kerney Elbe, DO [facility] PAGER is on AMION  If 7PM-7AM, please contact night-coverage [email or web address]

## 2020-11-27 NOTE — NC FL2 (Signed)
[place] LEVEL OF CARE SCREENING TOOL     IDENTIFICATION  Patient Name: [name] Birthdate: [birth year] Sex: male Admission Date (Current Location): 11/23/2020  [facility] and [place] Number:  Herbalist and Address:  [facility],  [place], [place]      Provider Number: [id number]  Attending Physician Name and Address:  Kerney Elbe, DO  Relative Name and Phone Number:       Current Level of Care: Hospital Recommended Level of Care: [place] Prior Approval Number:    Date Approved/Denied:   PASRR Number: [id number]  Discharge Plan: SNF    Current Diagnoses: Patient Active Problem List   Diagnosis Date Noted  . Incarcerated giant hiatal hernia 11/25/2020  . HOH (hard of hearing) 11/25/2020  . Scoliosis of thoracolumbar region due to degenerative disease of spine in adult 11/25/2020  . Right groin mass c/w sebaceous cyst 11/25/2020  . Cameron ulcer, acute with bleeding & anemia 11/25/2020  . IDA (iron deficiency anemia) 11/25/2020  . Uses roller walker 11/25/2020  . HTN (hypertension) 11/24/2020  . Hypothyroidism 11/24/2020  . Cognitive impairment 11/24/2020  . Acute GI bleeding from [facility] ulcerations in giant hiatal hernia 11/23/2020  . Umbilical hernia - reducible 06/20/2019  . CKD (chronic kidney disease), stage III ([place]) 02/03/2019  . H/O sinus bradycardia 11/20/2017  . Senile osteoporosis 11/20/2017  . Other idiopathic scoliosis, thoracolumbar region 11/20/2017  . Continuous leakage of urine 11/20/2017  . Protein-calorie malnutrition, moderate ([place]) 02/28/2014  . Chronic low back pain 02/26/2014  . Elevated LFTs 02/26/2014  . Alcohol use 02/26/2014  . Generalized weakness 02/26/2014  . GERD (gastroesophageal reflux disease) 08/13/2011  . Lumbar disc disease 08/13/2011    Orientation RESPIRATION BLADDER Height & Weight     Self,Time,Situation,Place  Normal Continent Weight:  67.1 kg Height:  5\' 8"  (172.7 cm)  BEHAVIORAL SYMPTOMS/MOOD NEUROLOGICAL BOWEL NUTRITION STATUS      Continent Diet  AMBULATORY STATUS COMMUNICATION OF NEEDS Skin   Extensive Assist Verbally Normal                       Personal Care Assistance Level of Assistance  Bathing,Dressing,Total care Bathing Assistance: Maximum assistance   Dressing Assistance: Maximum assistance Total Care Assistance: Maximum assistance   Functional Limitations Info             SPECIAL CARE FACTORS FREQUENCY  PT (By licensed PT),OT (By licensed OT)     PT Frequency: 5x weekly OT Frequency: 5x weekly            Contractures Contractures Info: Not present    Additional Factors Info  Code Status,Allergies Code Status Info: DNR Allergies Info: Amrix, shellfish           Current Medications (11/27/2020):  This is the current hospital active medication list Current Facility-Administered Medications  Medication Dose Route Frequency Provider Last Rate Last Admin  . Chlorhexidine Gluconate Cloth 2 % PADS 6 each  6 each Topical Daily Raiford Noble [place], [place]   6 each at 11/27/20 [phone]  . feeding supplement (ENSURE ENLIVE / ENSURE PLUS) liquid 237 mL  237 mL Oral BID BM Esterwood, Amy S, PA-C   237 mL at 11/27/20 0935  . levothyroxine (SYNTHROID) tablet 25 mcg  25 mcg Oral Q0600 Milus Banister, MD   25 mcg at 11/27/20 5462  . pantoprazole (PROTONIX) injection 40 mg  40 mg Intravenous Q12H Milus Banister,  MD   40 mg at 11/27/20 0938  . QUEtiapine (SEROQUEL) tablet 25 mg  25 mg Oral BID Milus Banister, MD   25 mg at 11/27/20 0936  . tamsulosin (FLOMAX) capsule 0.4 mg  0.4 mg Oral Daily Raiford Noble [place], DO   0.4 mg at 11/27/20 9702  . vitamin B-12 (CYANOCOBALAMIN) tablet 500 mcg  500 mcg Oral Daily Milus Banister, MD   500 mcg at 11/27/20 6378     Discharge Medications: Please see discharge summary for a list of discharge medications.  Relevant Imaging Results:  Relevant Lab  Results:   Additional Information SSN [id number]  Joaquin Courts, RN

## 2020-11-27 NOTE — Consult Note (Addendum)
Cardiology Consultation:   Patient ID: [name] MRN: [id number]; DOB: [birth year]  Admit date: 11/23/2020 Date of Consult: 11/27/2020  PCP:  Gayland Curry, [place]  Cardiologist:  New to Dr. Marlou Porch Advanced Practice Provider:  No care team member to display Electrophysiologist:  None   Patient Profile:   [name] is a 87 y.o. male with a hx of chronic back pain, CKD stage III, hypothyroidism, GERD, cerebral atrophy, GI bleed, hiatal hernia, hypercalcemia, HLD, HTN, Mallory-weiss tear, rhabdomyolysis who is being seen today for the evaluation of pre-op clearance at the request of Dr. Alfredia Ferguson.  History of Present Illness:   [name] has no prior cardiac history upon chart review. Remote echo 2015 was normal. He carries a history of cognitive impairment but seems quite sharp during our conversation today. He denies any prior cardiac problems, CP, dyspnea or palpitations. He lives at [place] and was admitted with coffee ground emesis. Upon arrival he was found to be tachycardic with significant anemia Hgb 6.5 (baseline 11-12), lactic acidosis, and + FOBT. He was treated with blood transfusion and IV PPI. EGD showed very abnormal GI anatomy with large prolapsing hiatal hernia with an ulcer at the GE junction versus the proximal stomach and dark escar. The surrounding mucosa was edematous, friable, and possibly neoplastic appearing. Pathology is pending. Initially we were called for pre-op evaluation for hiatal hernia repair but per d/w IM, general surgery felt the patient would be at too high risk for this. Therefore he may have gastric reduction with gastropexy instead and we are asked to weigh in on clearance. There are several other medical issues noted as well. There were also some concerns over dysphagia possibly needing a feeding gastrostomy tube post-operatively. He also has required foley catheter placement due to severe hydronephrosis and bladder  abnormalities on CT Scan. Palliative medicine has spoken tht he patient and they would like to proceed with surgical intervention but recommended outpatient palliative care follow at time of discharge. Labs otherwise notable for hyponatremia, hypocalcemia, hypoalbuminemia, normal TSH. CT scan incidentally noted coronary calcification.  On telemetry he is noted to be in NSR with occasional PACs and two brief episodes of narrow complex SVT, appearing to be atrial tachycardia. No overt atrial fib/flutter identified. He was asymptomatic with these.  EKG pending.   Past Medical History:  Diagnosis Date  . Acute kidney injury ([place]) 03/06/14  . Anemia, unspecified 03/06/14  . Arthritis   . BPH (benign prostatic hyperplasia)   . Cerebral atrophy ([place]) 03/06/14  . Cerebrovascular disease 03/06/14  . Degenerative disc disease, lumbar 03/06/14  . Diverticula, bladder acquired   . Edema 03/06/14  . GERD (gastroesophageal reflux disease)   . H/O: GI bleed 1986  . Hiatal hernia   . History of fall 03/06/14  . Hypercalcemia 03/06/14  . Hyperlipidemia   . Hypertension   . Impotence   . LFT elevation 03/06/14  . Lumbago 03/06/14  . Mallory-Weiss tear 1986  . Osteoporosis   . Rhabdomyolysis 03/06/14  . Scoliosis of cervical spine 03/06/14  . Scoliosis of lumbar spine 03/06/14  . Shingles   . Spermatocele    bilateral, left greater than right  . Troponin level elevated 03/06/14  . Weak 03/06/14    Past Surgical History:  Procedure Laterality Date  . BACK SURGERY  10/2009   ESI for surgery for lumbar spinal stenosis  . BIOPSY  11/24/2020   Procedure: BIOPSY;  Surgeon: Milus Banister, MD;  Location: [facility] ENDOSCOPY;  Service: Endoscopy;;  . clubbed toe repair  2004  . COLONOSCOPY  08/15/2011  . ESOPHAGOGASTRODUODENOSCOPY (EGD) WITH PROPOFOL N/A 11/24/2020   Procedure: ESOPHAGOGASTRODUODENOSCOPY (EGD) WITH PROPOFOL;  Surgeon: Milus Banister, MD;  Location: [facility] ENDOSCOPY;  Service: Endoscopy;  Laterality:  N/A;  . INGUINAL HERNIA REPAIR Right 11/26/2004   Dr Rebekah Chesterfield - open w mesh  . REPLACEMENT TOTAL KNEE BILATERAL  2007, 2008  . TONSILLECTOMY AND ADENOIDECTOMY  1939  . X-STOP IMPLANTATION       Home Medications:  Prior to Admission medications   Medication Sig Start Date End Date Taking? Authorizing Provider  acetaminophen (TYLENOL) 325 MG tablet Take 650 mg by mouth 2 (two) times daily.    Yes [provider]  acetaminophen (TYLENOL) 325 MG tablet Take 650 mg by mouth 2 (two) times daily as needed.   Yes [provider]  amLODipine (NORVASC) 2.5 MG tablet Take 1 tablet by mouth daily. 02/03/19  Yes [provider]  antiseptic oral rinse (BIOTENE) LIQD 15 mLs by Mouth Rinse route 4 (four) times daily as needed for dry mouth.   Yes [provider]  bisacodyl (DULCOLAX) 10 MG suppository Place 10 mg rectally as needed for moderate constipation.   Yes [provider]  levothyroxine (SYNTHROID) 25 MCG tablet Take 1 tablet (25 mcg total) by mouth daily before breakfast. 04/17/20  Yes Reed, Tiffany L, DO  Melatonin 5 MG CAPS Take 1 capsule (5 mg total) by mouth at bedtime. 07/04/20  Yes Reed, Tiffany L, DO  ondansetron (ZOFRAN) 4 MG tablet Take 4 mg by mouth every 6 (six) hours as needed for nausea or vomiting.   Yes [provider]  Polyethyl Glycol-Propyl Glycol (SYSTANE) 0.4-0.3 % SOLN Two drops both eyes three times daily as needed for dry itchy eyes.   Yes [provider]  QUEtiapine (SEROQUEL) 25 MG tablet Take 25 mg by mouth 2 (two) times daily.  02/09/19 11/23/20 Yes [provider]  triamcinolone cream (KENALOG) 0.1 % Apply 1 application topically as needed. 02/25/19  Yes [provider]  vitamin B-12 (CYANOCOBALAMIN) 1000 MCG tablet Take 500 mcg by mouth daily.    Yes [provider]  lactose free nutrition (BOOST) LIQD Take 237 mLs by mouth 2 (two) times daily between meals. Patient not taking: Reported on  11/23/2020    [provider]  loperamide (IMODIUM) 2 MG capsule Take 2 mg by mouth 3 (three) times daily as needed for diarrhea or loose stools. Patient not taking: Reported on 11/23/2020    [provider]  pantoprazole (PROTONIX) 40 MG tablet Take 1 tablet (40 mg total) by mouth daily before supper. 07/04/20   Gayland Curry, DO    Inpatient Medications: Scheduled Meds: . Chlorhexidine Gluconate Cloth  6 each Topical Daily  . feeding supplement  237 mL Oral BID BM  . levothyroxine  25 mcg Oral Q0600  . pantoprazole (PROTONIX) IV  40 mg Intravenous Q12H  . QUEtiapine  25 mg Oral BID  . tamsulosin  0.4 mg Oral Daily  . vitamin B-12  500 mcg Oral Daily   Continuous Infusions:  PRN Meds:   Allergies:    Allergies  Allergen Reactions  . Amrix [Cyclobenzaprine]   . Shellfish Allergy     Social History:   Social History   Socioeconomic History  . Marital status: Divorced    Spouse name: Not on file  . Number of children: 2  . Years of  education: Not on file  . Highest education level: Not on file  Occupational History  . Occupation: retired  Tobacco Use  . Smoking status: Former Smoker    Types: Cigarettes  . Smokeless tobacco: Never Used  . Tobacco comment: quit in 1960s  Substance and Sexual Activity  . Alcohol use: Yes    Alcohol/week: 14.0 standard drinks    Types: 14 Standard drinks or equivalent per week    Comment: couple of drinks of scotch every other day  . Drug use: No  . Sexual activity: Not on file  Other Topics Concern  . Not on file  Social History Narrative   Tobacco use, amount per day now:  NONE   Past tobacco use, amount per day: 1 PACK PER DAY   How many years did you use tobacco: QUIT AGE 24/ 17 YEARS   Alcohol use (drinks per week): 3OZ DAILY/ 2 4OZ GLASSES OF WINE   Diet: REGULAR   Do you drink/eat things with caffeine:   Marital status: DIVORCED              What year were you married?   Do you live in a house, apartment,  assisted living, condo, trailer, etc.? [place]    Is it one or more stories?  ONE STORY   How many persons live in your home? 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  WALK                                Type and how often?   Do you have a living will? YES   Do you have a DNR form?     NO                              If not, do you want to discuss one?   Do you have signed POA/HPOA forms?   YES                     If so, please bring to you appointment   Social Determinants of Health   Financial Resource Strain: Not on file  Food Insecurity: Not on file  Transportation Needs: Not on file  Physical Activity: Not on file  Stress: Not on file  Social Connections: Not on file  Intimate Partner Violence: Not on file    Family History:   Family History  Problem Relation Age of Onset  . Alcoholism Father   . Liver disease Father   . Colon cancer Neg Hx      ROS:  Please see the history of present illness.  All other ROS reviewed and negative.     Physical Exam/Data:   Vitals:   11/26/20 0610 11/26/20 1331 11/26/20 2031 11/27/20 0436  BP: 140/62 (!) 141/60 138/80 98/68  Pulse: (!) 51 (!) 42 71 68  Resp: 17 17 17 18   Temp: 98.6 F (37 C) 98.2 F (36.8 C) 97.9 F (36.6 C) 98.2 F (36.8 C)  TempSrc:  Oral Oral Oral  SpO2: 100% 100% 100% 97%  Weight:      Height:        Intake/Output Summary (Last 24 hours) at 11/27/2020 0943 Last data filed at 11/27/2020 0500  Gross per 24 hour  Intake 20 ml  Output 2900 ml  Net -2880 ml   Last 3 Weights 11/25/2020 11/25/2020 10/17/2020  Weight (lbs) 148 lb 154 lb 8.7 oz 152 lb 9.6 oz  Weight (kg) 67.132 kg 70.1 kg 69.219 kg     Body mass index is 22.5 kg/m.  General: Well developed elderly WM in no acute distress. Head: Normocephalic, atraumatic, sclera non-icteric, no xanthomas, nares are  without discharge. Neck: Negative for carotid bruits. JVP not elevated. Lungs: Clear bilaterally to auscultation without wheezes, rales, or rhonchi. Breathing is unlabored. Heart: RRR occasional ectopy S1 S2 without murmurs, rubs, or gallops.  Abdomen: Soft, non-tender, non-distended with normoactive bowel sounds. No rebound/guarding. Extremities: No clubbing or cyanosis. No edema. Distal pedal pulses are 2+ and equal bilaterally. Neuro: Alert and oriented X 3. Moves all extremities spontaneously. Psych:  Responds to questions appropriately with a normal affect.   EKG:  The EKG was personally reviewed and demonstrates:  Not performed  Telemetry:  Telemetry was personally reviewed and demonstrates: NSR with PACs, 2 brief episodes of narrow complex SVT (? Atrial tach) on 4/11   Relevant CV Studies: 2d echo 02/2014   Laboratory Data:  High Sensitivity Troponin:  No results for input(s): TROPONINIHS in the last 720 hours.   Chemistry Recent Labs  Lab 11/25/20 0334 11/26/20 0332 11/27/20 0327  NA 139 134* 134*  K 3.4* 3.9 4.0  CL 112* 109 106  CO2 20* 19* 21*  GLUCOSE 92 103* 97  BUN 41* 24* 21  CREATININE 1.41* 1.38* 1.29*  CALCIUM 7.6* 7.7* 7.9*  GFRNONAA 46* 48* 52*  ANIONGAP 7 6 7     Recent Labs  Lab 11/25/20 0334 11/26/20 0332 11/27/20 0327  PROT 5.2* 5.3* 5.6*  ALBUMIN 2.5* 2.5* 2.5*  AST 16 14* 16  ALT 13 12 14   ALKPHOS 50 54 60  BILITOT 0.9 1.0 0.9   Hematology Recent Labs  Lab 11/25/20 0334 11/25/20 1549 11/26/20 0332 11/27/20 0327  WBC 9.6  --  9.2 8.4  RBC 2.61*  --  2.73* 2.86*  HGB 7.6* 7.2* 8.1* 7.6*  8.4*  HCT 24.1* 24.7* 24.8* 23.8*  26.1*  MCV 92.3  --  90.8 91.3  MCH 29.1  --  29.7 29.4  MCHC 31.5  --  32.7 32.2  RDW 15.9*  --  15.9* 15.9*  PLT 207  --  240 265   BNPNo results for input(s): BNP, PROBNP in the last 168 hours.  DDimer No results for input(s): DDIMER in the last 168 hours.   Radiology/Studies:  CT CHEST W  CONTRAST  Result Date: 11/24/2020 CLINICAL DATA:  Hematemesis, suspect large hiatal hernia EXAM: CT CHEST, ABDOMEN, AND PELVIS WITH CONTRAST TECHNIQUE: Multidetector CT imaging of the chest, abdomen and pelvis was performed following the standard protocol during bolus administration of intravenous contrast. CONTRAST:  34mL OMNIPAQUE IOHEXOL 300 MG/ML SOLN, additional oral enteric contrast COMPARISON:  None. FINDINGS: CT CHEST FINDINGS Cardiovascular: Aortic atherosclerosis. Normal heart size. Scattered coronary artery calcifications. No pericardial effusion. Mediastinum/Nodes: No enlarged mediastinal, hilar, or axillary lymph nodes. Large hiatal hernia with complete intrathoracic position of the stomach as well as portions of the pancreatic head and neck and multiple loops of nonobstructed small bowel. Thyroid gland, trachea, and esophagus demonstrate no significant findings. Lungs/Pleura: Trace bilateral pleural effusions. Compressive atelectasis of the right lower lobe associated with a large hiatal hernia. Musculoskeletal: No chest wall mass or suspicious bone lesions identified. CT ABDOMEN PELVIS FINDINGS Hepatobiliary:  No solid liver abnormality is seen. No gallstones, gallbladder wall thickening, or biliary dilatation. Pancreas: Unremarkable. No pancreatic ductal dilatation or surrounding inflammatory changes. Spleen: Normal in size without significant abnormality. Adrenals/Urinary Tract: Adrenal glands are unremarkable. Severe bilateral hydronephrosis and hydroureter to the ureterovesicular junctions. There is severe urinary bladder wall thickening and distention of the urinary bladder. Stomach/Bowel: Stomach is within normal limits. Appendix appears normal. No evidence of bowel wall thickening, distention, or inflammatory changes. Vascular/Lymphatic: Aortic atherosclerosis. No enlarged abdominal or pelvic lymph nodes. Reproductive: TURP defect of the prostate. Other: There is a midline epigastric hernia  containing multiple loops of nonobstructed small bowel (series 2, image 72). No abdominopelvic ascites. Musculoskeletal: No acute or significant osseous findings. IMPRESSION: 1. Large hiatal hernia with complete intrathoracic position of the stomach as well as portions of the pancreatic head and neck and multiple loops of nonobstructed small bowel. 2. Trace bilateral pleural effusions. Compressive atelectasis of the right lower lobe associated with a large hiatal hernia. 3. Severe bilateral hydronephrosis and hydroureter to the ureterovesicular junctions. There is severe urinary bladder wall thickening and distention of the urinary bladder. No obstructing calculi or other lesion identified. 4. TURP defect of the prostate. 5. There is a midline epigastric hernia containing multiple loops of nonobstructed small bowel. 6. Coronary artery disease. Aortic Atherosclerosis (ICD10-I70.0). Electronically Signed   By: Eddie Candle M.D.   On: 11/24/2020 23:27   CT ABDOMEN PELVIS W CONTRAST  Result Date: 11/24/2020 CLINICAL DATA:  Hematemesis, suspect large hiatal hernia EXAM: CT CHEST, ABDOMEN, AND PELVIS WITH CONTRAST TECHNIQUE: Multidetector CT imaging of the chest, abdomen and pelvis was performed following the standard protocol during bolus administration of intravenous contrast. CONTRAST:  79mL OMNIPAQUE IOHEXOL 300 MG/ML SOLN, additional oral enteric contrast COMPARISON:  None. FINDINGS: CT CHEST FINDINGS Cardiovascular: Aortic atherosclerosis. Normal heart size. Scattered coronary artery calcifications. No pericardial effusion. Mediastinum/Nodes: No enlarged mediastinal, hilar, or axillary lymph nodes. Large hiatal hernia with complete intrathoracic position of the stomach as well as portions of the pancreatic head and neck and multiple loops of nonobstructed small bowel. Thyroid gland, trachea, and esophagus demonstrate no significant findings. Lungs/Pleura: Trace bilateral pleural effusions. Compressive atelectasis  of the right lower lobe associated with a large hiatal hernia. Musculoskeletal: No chest wall mass or suspicious bone lesions identified. CT ABDOMEN PELVIS FINDINGS Hepatobiliary: No solid liver abnormality is seen. No gallstones, gallbladder wall thickening, or biliary dilatation. Pancreas: Unremarkable. No pancreatic ductal dilatation or surrounding inflammatory changes. Spleen: Normal in size without significant abnormality. Adrenals/Urinary Tract: Adrenal glands are unremarkable. Severe bilateral hydronephrosis and hydroureter to the ureterovesicular junctions. There is severe urinary bladder wall thickening and distention of the urinary bladder. Stomach/Bowel: Stomach is within normal limits. Appendix appears normal. No evidence of bowel wall thickening, distention, or inflammatory changes. Vascular/Lymphatic: Aortic atherosclerosis. No enlarged abdominal or pelvic lymph nodes. Reproductive: TURP defect of the prostate. Other: There is a midline epigastric hernia containing multiple loops of nonobstructed small bowel (series 2, image 72). No abdominopelvic ascites. Musculoskeletal: No acute or significant osseous findings. IMPRESSION: 1. Large hiatal hernia with complete intrathoracic position of the stomach as well as portions of the pancreatic head and neck and multiple loops of nonobstructed small bowel. 2. Trace bilateral pleural effusions. Compressive atelectasis of the right lower lobe associated with a large hiatal hernia. 3. Severe bilateral hydronephrosis and hydroureter to the ureterovesicular junctions. There is severe urinary bladder wall thickening and distention of the urinary bladder. No obstructing calculi or other lesion identified.  4. TURP defect of the prostate. 5. There is a midline epigastric hernia containing multiple loops of nonobstructed small bowel. 6. Coronary artery disease. Aortic Atherosclerosis (ICD10-I70.0). Electronically Signed   By: Eddie Candle M.D.   On: 11/24/2020 23:27      Assessment and Plan:   1. Acute blood loss anemia from upper GI bleed, also with hiatal hernia abnormalities - felt too high risk for hiatal hernia repair so general surgery considering gastric reduction and gastropexy - management per primary teams  2. Pre-operative cardiovascular examination - patient has no historical cardiovascular conditions which would preclude surgery but would be at increased risk purely based on comorbidities and his advanced age. However, it does not seem that there is any specific cardiac testing that would be of clinical benefit pre-operatively give clinical stability. Will discuss with Dr. Marlou Porch - have ordered a pre-op EKG for baseline - MD to review upon completion of consult. The first tracing they did did not cross over into epic and also had question of arm lead reversal. Repeat with troubleshooting is pending  3. Intermittent transient narrow complex tachycardia - appears most consistent with an SVT such as atrial tachycardia - no overt afib/flutter seen - will review with MD - TSH wnl - follow conservatively given tendency for soft BP by last check, can consider low dose beta blocker if this recurs/persists   Risk Assessment/Risk Scores:     N/A  For questions or updates, please contact [facility] Please consult [email or web address] for contact info under    Signed, Charlie Pitter, PA-C  11/27/2020 9:43 AM  Personally seen and examined. Agree with above.   87 year old with upper GI bleed.  Hemoglobin 6.5.  General surgery felt too high risk for hiatal hernia repair.  He may have a gastric reduction with gastroplexy instead.  Palliative care medicine has spoken to the patient, they would like to proceed with surgical intervention if possible.  Coronary calcification noted on CT.  Telemetry normal sinus rhythm, occasional PACs.  Creatinine 1.29, hemoglobin now 8.4  On exam lungs appear clear, normal respiratory effort, alert, seems to answer  questions appropriately, no significant edema, heart regular rate and rhythm without any murmurs.  Assessment and plan:  Preoperative risk assessment -Given his advanced age, he would be of moderate for general anesthesia/any surgery.  He may proceed from a cardiac perspective without any further testing.  I do not believe that stress testing for instance would be able to modify any risk factors preoperatively given his overall clinical stability. -Blood pressure soft.  Continue to monitor. -We will order echocardiogram.  GI bleed -Surgical note, GI note reviewed, surgical note reviewed.  Paroxysmal atrial tachycardia -Several beat run noted on telemetry.  No evidence of A. Fib.  Candee Furbish, MD

## 2020-11-27 NOTE — Consult Note (Signed)
Consultation Note Date: 11/27/2020   Patient Name: [name]  DOB: [birth year]  MRN: [id number]  Age / Sex: 87 y.o., male  PCP: Gayland Curry, DO Referring Physician: Kerney Elbe, DO  Reason for Consultation: Establishing goals of care  HPI/Patient Profile: 87 y.o. male  with past medical history of cognitive impairment, chronic back pain, chronic kidney disease stage III, hypothyroidism, GERD admitted on 11/23/2020 with coffee-ground emesis.  Work-up has revealed large prolapsing hiatal hernia and Cameron ulcer.  He has been evaluated by surgery and initial consideration was for hiatal hernia repair, but now thinking more about hernia reduction and gastropexy.  Palliative consulted for goals of care.  Clinical Assessment and Goals of Care: I met today with [name] by himself and then I met again with him later in conjunction with his daughter [name]) and ex-wife/Lawyer/healthcare power of attorney Arbie Cookey).  [name] reports that he was in the [place] and then went to college and received a degree in accounting and has been a Engineer, maintenance (IT).  He had 2 daughters, but 1 passed earlier this year from complications from diabetes.  He currently lives at [facility].  We reviewed understanding of his condition with large hiatal hernia and options for care moving forward including surgical intervention versus foregoing surgical repair.  [name] and his family report that initially, they were debating options and leaning towards not having surgery.  This was when primary consideration for was a large hiatal hernia repair.  After further discussion with Dr. Rosendo Gros, [name] now believes that he would be best served by trying a less invasive procedure (gastropexy).  If this fails, however, he states that he does not think that he would want to undergo a larger procedure.  We also discussed long-term changes that he may continue to  experience in regard to his nutrition, cognition, and functional status.  Arbie Cookey, his ex-wife/HC POA reports that she is very much wanting to focus on [name] having as much quality of life as possible and she believes that continued support from outpatient palliative care will be beneficial in this goal.  We also discussed that there may be a point in time in the future where [name] wants to focus his care on staying out of the hospital and being as comfortable possible at [facility].  When this time comes, we discussed hospice and the benefits they could provide.  At the current time, however, they are interested in seeing how he would do with surgical intervention for hiatal hernia and seeing how he does with rehab prior to making further decisions.  Therefore made recommendation for follow-up by outpatient palliative care in a couple of weeks.  SUMMARY OF RECOMMENDATIONS   - DNR/ DNI - Would like to pursue surgical intervention as recommended by Dr. Rosendo Gros.  His daughter is requesting call to discuss with surgery prior to procedure. - If initial surgery is unsuccessful, he states he is not sure he would want to undergo any subsequent or larger procedures.  Would require further  discussion regarding goals at that point. -Recommend he be followed by outpatient palliative care when the time comes he is able to discharge from the hospital.  Prognosis:   Unable to determine  Discharge Planning: Back to [facility] with outpatient palliative care through [facility] to follow      Primary Diagnoses: Present on Admission: . Acute GI bleeding from [facility] ulcerations in giant hiatal hernia . CKD (chronic kidney disease), stage III ([name])   I have reviewed the medical record, interviewed the patient and family, and examined the patient. The following aspects are pertinent.  Past Medical History:  Diagnosis Date  . Acute kidney injury ([place]) 03/06/14  . Anemia, unspecified 03/06/14  . Arthritis    . BPH (benign prostatic hyperplasia)   . Cerebral atrophy ([place]) 03/06/14  . Cerebrovascular disease 03/06/14  . Degenerative disc disease, lumbar 03/06/14  . Diverticula, bladder acquired   . Edema 03/06/14  . GERD (gastroesophageal reflux disease)   . H/O: GI bleed 1986  . Hiatal hernia   . History of fall 03/06/14  . Hypercalcemia 03/06/14  . Hyperlipidemia   . Hypertension   . Impotence   . LFT elevation 03/06/14  . Lumbago 03/06/14  . Mallory-Weiss tear 1986  . Osteoporosis   . Rhabdomyolysis 03/06/14  . Scoliosis of cervical spine 03/06/14  . Scoliosis of lumbar spine 03/06/14  . Shingles   . Spermatocele    bilateral, left greater than right  . Troponin level elevated 03/06/14  . Weak 03/06/14   Social History   Socioeconomic History  . Marital status: Divorced    Spouse name: Not on file  . Number of children: 2  . Years of education: Not on file  . Highest education level: Not on file  Occupational History  . Occupation: retired  Tobacco Use  . Smoking status: Former Smoker    Types: Cigarettes  . Smokeless tobacco: Never Used  . Tobacco comment: quit in 1960s  Substance and Sexual Activity  . Alcohol use: Yes    Alcohol/week: 14.0 standard drinks    Types: 14 Standard drinks or equivalent per week    Comment: couple of drinks of scotch every other day  . Drug use: No  . Sexual activity: Not on file  Other Topics Concern  . Not on file  Social History Narrative   Tobacco use, amount per day now:  NONE   Past tobacco use, amount per day: 1 PACK PER DAY   How many years did you use tobacco: QUIT AGE 10/ 17 YEARS   Alcohol use (drinks per week): 3OZ DAILY/ 2 4OZ GLASSES OF WINE   Diet: REGULAR   Do you drink/eat things with caffeine:   Marital status: DIVORCED              What year were you married?   Do you live in a house, apartment, assisted living, condo, trailer, etc.? [place]    Is it one or more stories?  ONE STORY   How many persons  live in your home? 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  WALK                                Type and how often?   Do you have a living will? YES  Do you have a DNR form?     NO                              If not, do you want to discuss one?   Do you have signed POA/HPOA forms?   YES                     If so, please bring to you appointment   Social Determinants of Health   Financial Resource Strain: Not on file  Food Insecurity: Not on file  Transportation Needs: Not on file  Physical Activity: Not on file  Stress: Not on file  Social Connections: Not on file   Family History  Problem Relation Age of Onset  . Alcoholism Father   . Liver disease Father   . Colon cancer Neg Hx    Scheduled Meds: . Chlorhexidine Gluconate Cloth  6 each Topical Daily  . feeding supplement  237 mL Oral BID BM  . levothyroxine  25 mcg Oral Q0600  . pantoprazole (PROTONIX) IV  40 mg Intravenous Q12H  . QUEtiapine  25 mg Oral BID  . tamsulosin  0.4 mg Oral Daily  . vitamin B-12  500 mcg Oral Daily   Continuous Infusions: PRN Meds:. Medications Prior to Admission:  Prior to Admission medications   Medication Sig Start Date End Date Taking? Authorizing Provider  acetaminophen (TYLENOL) 325 MG tablet Take 650 mg by mouth 2 (two) times daily.    Yes [provider]  acetaminophen (TYLENOL) 325 MG tablet Take 650 mg by mouth 2 (two) times daily as needed.   Yes [provider]  amLODipine (NORVASC) 2.5 MG tablet Take 1 tablet by mouth daily. 02/03/19  Yes [provider]  antiseptic oral rinse (BIOTENE) LIQD 15 mLs by Mouth Rinse route 4 (four) times daily as needed for dry mouth.   Yes [provider]  bisacodyl (DULCOLAX) 10 MG suppository Place 10 mg rectally as needed for moderate constipation.   Yes [provider]   levothyroxine (SYNTHROID) 25 MCG tablet Take 1 tablet (25 mcg total) by mouth daily before breakfast. 04/17/20  Yes Reed, Tiffany L, DO  Melatonin 5 MG CAPS Take 1 capsule (5 mg total) by mouth at bedtime. 07/04/20  Yes Reed, Tiffany L, DO  ondansetron (ZOFRAN) 4 MG tablet Take 4 mg by mouth every 6 (six) hours as needed for nausea or vomiting.   Yes [provider]  Polyethyl Glycol-Propyl Glycol (SYSTANE) 0.4-0.3 % SOLN Two drops both eyes three times daily as needed for dry itchy eyes.   Yes [provider]  QUEtiapine (SEROQUEL) 25 MG tablet Take 25 mg by mouth 2 (two) times daily.  02/09/19 11/23/20 Yes [provider]  triamcinolone cream (KENALOG) 0.1 % Apply 1 application topically as needed. 02/25/19  Yes [provider]  vitamin B-12 (CYANOCOBALAMIN) 1000 MCG tablet Take 500 mcg by mouth daily.    Yes [provider]  lactose free nutrition (BOOST) LIQD Take 237 mLs by mouth 2 (two) times daily between meals. Patient not taking: Reported on 11/23/2020    [provider]  loperamide (IMODIUM) 2 MG capsule Take 2 mg by mouth 3 (three) times daily as needed for diarrhea or loose stools. Patient not taking: Reported on 11/23/2020    [provider]  pantoprazole (PROTONIX) 40 MG tablet Take 1 tablet (40 mg total) by mouth daily before  supper. 07/04/20   Reed, Tiffany L, DO   Allergies  Allergen Reactions  . Amrix [Cyclobenzaprine]   . Shellfish Allergy    Review of Systems  Endorses early satiety.  Otherwise declines all symptoms.  What I will be on a minute  Physical Exam  General: Alert, awake, in no acute distress.  HEENT: No bruits, no goiter, no JVD Heart: Regular rate and rhythm. No murmur appreciated. Lungs: Fair air movement, clear Abdomen: Soft, nontender, nondistended, positive bowel sounds.  Ext: No significant edema Skin: Warm and dry Neuro: Grossly intact, nonfocal.   Vital Signs: BP 98/68 (BP Location: Right  Arm)   Pulse 68   Temp 98.2 F (36.8 C) (Oral)   Resp 18   Ht _0  (1.727 m)   Wt 67.1 kg   SpO2 97%   BMI 22.50 kg/m  Pain Scale: 0-10   Pain Score: 0-No pain   SpO2: SpO2: 97 % O2 Device:SpO2: 97 % O2 Flow Rate: .O2 Flow Rate (L/min): 5 L/min  IO: Intake/output summary:   Intake/Output Summary (Last 24 hours) at 11/27/2020 1051 Last data filed at 11/27/2020 0500 Gross per 24 hour  Intake 20 ml  Output 2900 ml  Net -2880 ml    LBM: Last BM Date: 11/26/20 Baseline Weight: Weight: 70.1 kg Most recent weight: Weight: 67.1 kg     Palliative Assessment/Data:   Flowsheet Rows   Flowsheet Row Most Recent Value  Intake Tab   Referral Department Hospitalist  Unit at Time of Referral Med/Surg Unit  Palliative Care Primary Diagnosis Other (Comment)  [GI]  Date Notified 11/25/20  Palliative Care Type Return patient Palliative Care  Reason for referral Clarify Goals of Care  Date of Admission 11/23/20  Date first seen by Palliative Care 11/26/20  # of days Palliative referral response time 1 Day(s)  # of days IP prior to Palliative referral 2  Clinical Assessment   Palliative Performance Scale Score 40%  Psychosocial & Spiritual Assessment   Palliative Care Outcomes   Patient/Family meeting held? Yes  Who was at the meeting? Patient, daughter, Chauncey Reading     Time Total: 80 minutes over 2 encounters (once with patient, and once with patient, his daughter, and his ex-wife/HC POA) Greater than 50%  of this time was spent counseling and coordinating care related to the above assessment and plan.  Signed by: Micheline Rough, MD   Please contact Palliative Medicine Team phone at [phone] for questions and concerns.  For individual provider: See Shea Evans

## 2020-11-27 NOTE — Progress Notes (Signed)
Initial Nutrition Assessment  DOCUMENTATION CODES:   Non-severe (moderate) malnutrition in context of chronic illness  INTERVENTION:  - continue Ensure Enlive BID, each supplement provides 350 kcal and 20 grams of protein.   NUTRITION DIAGNOSIS:   Moderate Malnutrition related to chronic illness as evidenced by mild fat depletion,mild muscle depletion,moderate muscle depletion.  GOAL:   Patient will meet greater than or equal to 90% of their needs  MONITOR:   PO intake,Supplement acceptance,Diet advancement,Labs,Weight trends  REASON FOR ASSESSMENT:   Consult Assessment of nutrition requirement/status  ASSESSMENT:   87 y.o. male with medical history of cognitive impairment, chronic back pain, stage 3 CKD, hypothyroidism, and GERD. He presented to the ED from [facility] due to coffee-ground emesis.  Diet advanced from NPO to CLD on 4/9 at 47 and to [place] on 4/11 at 0940. The only documented meal intake was 10% of dinner on 4/10 (CLD).   Ensure Enlive was ordered BID starting yesterday and he has accepted all 3 bottles offered to him so far.   Patient sitting in the chair with no family or visitors present. Breakfast tray in front of him with 100% completion of fruit juice, 90% of coffee, and 100% of a cup of cream of potato soup. No other items on the tray.  Patient reports eating a large breakfast (in reference to this tray). He denies any chewing or swallowing difficulties at baseline and denies any abdominal pain, pressure, or nausea after breakfast meal.   He lives at [facility] and greatly enjoys the food there. He goes to the dining room for his meals. He is able to feed himself without issue.   Weight on 4/10 was documented as both 148 lb and 154 lb. Weight on 10/17/20 was 152 lb, and weight on 02/29/20 was 158 lb.   Patient is DNR/DNI.  Per notes: - acute blood loss anemia 2/2 UGIB pending surgical intervention - bilateral hydronephrosis, urinary retention -  cognitive impairment at baseline   Labs reviewed; Na: 134 mmol/l, creatinine: 1.29 mg/dl, Ca: 7.9 mg/dl, GFR: 52 ml/min.   Medications reviewed; 25 mcg oral synthroid/day, 40 mg IV protonix BID, 500 mcg oral cyanocobalamin/day.    NUTRITION - FOCUSED PHYSICAL EXAM:  Flowsheet Row Most Recent Value  Orbital Region Mild depletion  Upper Arm Region Mild depletion  Thoracic and Lumbar Region Unable to assess  Buccal Region Mild depletion  Temple Region Mild depletion  Clavicle Bone Region Moderate depletion  Clavicle and Acromion Bone Region Moderate depletion  Scapular Bone Region Mild depletion  Dorsal Hand Mild depletion  Patellar Region Unable to assess  Anterior Thigh Region Unable to assess  Posterior Calf Region Unable to assess  Edema (RD Assessment) Unable to assess  Hair Reviewed  Eyes Reviewed  Mouth Reviewed  Skin Reviewed  Nails Reviewed       Diet Order:   Diet Order            Diet full liquid Room service appropriate? Yes; Fluid consistency: Thin  Diet effective now                 EDUCATION NEEDS:   No education needs have been identified at this time  Skin:  Skin Assessment: Reviewed RN Assessment  Last BM:  4/11 (type 1 x1; type 7 x1)  Height:   Ht Readings from Last 1 Encounters:  11/25/20 5\' 8"  (1.727 m)    Weight:   Wt Readings from Last 1 Encounters:  11/25/20 67.1 kg  Estimated Nutritional Needs:  Kcal:  1550-1750 kcal Protein:  70-80 grams Fluid:  >/= 1.8 L/day     Jarome Matin, MS, RD, LDN, CNSC Inpatient Clinical Dietitian RD pager # available in [facility]  After hours/weekend pager # available in [facility]

## 2020-11-27 NOTE — TOC Progression Note (Signed)
Transition of Care [facility]) - Progression Note    Patient Details  Name: Malachy Coleman MRN: [id number] Date of Birth: Jul 06, 1925  [facility]) CM/SW Contact  Joaquin Courts, RN Phone Number: 11/27/2020, 12:08 PM  Clinical Narrative:    CM followed up with patient re dc planning.  Patient is in agreement with short term rehab at [facility].  FL2 faxed to [facility] and VM left for rep notifying her patient will need SNF when he returns to facility.  TOC will continue to follow.   Expected Discharge Plan: [place] Barriers to Discharge: Continued Medical Work up  Expected Discharge Plan and Services Expected Discharge Plan: [place]   Discharge Planning Services: CM Consult   Living arrangements for the past 2 months: Assisted Living Facility                                       Social Determinants of Health (SDOH) Interventions    Readmission Risk Interventions No flowsheet data found.

## 2020-11-27 NOTE — Progress Notes (Signed)
Palliative care brief progress note  I met today with [name], his daughter, [name], and his ex-wife/Lawyer/HC POA, Arbie Cookey.  He is agreeable to surgical intervention as discussed with him by Dr. Rosendo Gros for gastric reduction and gastropexy.  He would not want larger surgery such as complete hiatal hernia repair.  -DNR/DNI -He would like to proceed with surgical intervention.  His daughter would like to speak with surgery team prior to him undergoing surgical intervention. -Recommend outpatient palliative care to follow time of discharge. -Full consult to follow  Micheline Rough, MD [place] Team [phone]

## 2020-11-28 ENCOUNTER — Inpatient Hospital Stay (HOSPITAL_COMMUNITY): Payer: Medicare Other | Admitting: Certified Registered Nurse Anesthetist

## 2020-11-28 ENCOUNTER — Encounter (HOSPITAL_COMMUNITY): Payer: Self-pay | Admitting: Family Medicine

## 2020-11-28 ENCOUNTER — Encounter (HOSPITAL_COMMUNITY)
Admission: EM | Disposition: A | Discharge status: Skilled Nursing Facility | Payer: Self-pay | Source: Skilled Nursing Facility | Attending: Internal Medicine

## 2020-11-28 HISTORY — PX: HIATAL HERNIA REPAIR: SHX195

## 2020-11-28 LAB — CBC WITH DIFFERENTIAL/PLATELET
Abs Immature Granulocytes: 0.03 10*3/uL (ref 0.00–0.07)
Basophils Absolute: 0 10*3/uL (ref 0.0–0.1)
Basophils Relative: 0 %
Eosinophils Absolute: 0.2 10*3/uL (ref 0.0–0.5)
Eosinophils Relative: 2 %
HCT: 26.9 % — ABNORMAL LOW (ref 39.0–52.0)
Hemoglobin: 8.4 g/dL — ABNORMAL LOW (ref 13.0–17.0)
Immature Granulocytes: 0 %
Lymphocytes Relative: 17 %
Lymphs Abs: 1.5 10*3/uL (ref 0.7–4.0)
MCH: 29.2 pg (ref 26.0–34.0)
MCHC: 31.2 g/dL (ref 30.0–36.0)
MCV: 93.4 fL (ref 80.0–100.0)
Monocytes Absolute: 1.7 10*3/uL — ABNORMAL HIGH (ref 0.1–1.0)
Monocytes Relative: 18 %
Neutro Abs: 5.8 10*3/uL (ref 1.7–7.7)
Neutrophils Relative %: 63 %
Platelets: 284 10*3/uL (ref 150–400)
RBC: 2.88 MIL/uL — ABNORMAL LOW (ref 4.22–5.81)
RDW: 16.1 % — ABNORMAL HIGH (ref 11.5–15.5)
WBC: 9.2 10*3/uL (ref 4.0–10.5)
nRBC: 0 % (ref 0.0–0.2)

## 2020-11-28 LAB — COMPREHENSIVE METABOLIC PANEL
ALT: 12 U/L (ref 0–44)
AST: 15 U/L (ref 15–41)
Albumin: 2.6 g/dL — ABNORMAL LOW (ref 3.5–5.0)
Alkaline Phosphatase: 64 U/L (ref 38–126)
Anion gap: 10 (ref 5–15)
BUN: 17 mg/dL (ref 8–23)
CO2: 20 mmol/L — ABNORMAL LOW (ref 22–32)
Calcium: 7.7 mg/dL — ABNORMAL LOW (ref 8.9–10.3)
Chloride: 102 mmol/L (ref 98–111)
Creatinine, Ser: 1.49 mg/dL — ABNORMAL HIGH (ref 0.61–1.24)
GFR, Estimated: 43 mL/min — ABNORMAL LOW (ref >60)
Glucose, Bld: 98 mg/dL (ref 70–99)
Potassium: 3.9 mmol/L (ref 3.5–5.1)
Sodium: 132 mmol/L — ABNORMAL LOW (ref 135–145)
Total Bilirubin: 0.8 mg/dL (ref 0.3–1.2)
Total Protein: 5.7 g/dL — ABNORMAL LOW (ref 6.5–8.1)

## 2020-11-28 LAB — PHOSPHORUS: Phosphorus: 3 mg/dL (ref 2.5–4.6)

## 2020-11-28 LAB — MAGNESIUM: Magnesium: 1.8 mg/dL (ref 1.7–2.4)

## 2020-11-28 LAB — SURGICAL PATHOLOGY

## 2020-11-28 SURGERY — REPAIR, HERNIA, HIATAL, LAPAROSCOPIC
Anesthesia: General

## 2020-11-28 MED ORDER — PHENYLEPHRINE HCL-NACL 10-0.9 MG/250ML-% IV SOLN
INTRAVENOUS | Status: DC | PRN
  Administered 2020-11-28: 20 ug/min via INTRAVENOUS

## 2020-11-28 MED ORDER — ACETAMINOPHEN 10 MG/ML IV SOLN
INTRAVENOUS | Status: AC
  Filled 2020-11-28: qty 100

## 2020-11-28 MED ORDER — ACETAMINOPHEN 325 MG PO TABS: 325.0000 mg | ORAL_TABLET | Freq: Four times a day (QID) | ORAL | Status: DC | PRN

## 2020-11-28 MED ORDER — DEXAMETHASONE SODIUM PHOSPHATE 10 MG/ML IJ SOLN
INTRAMUSCULAR | Status: DC | PRN
  Administered 2020-11-28: 4 mg via INTRAVENOUS

## 2020-11-28 MED ORDER — LIDOCAINE 2% (20 MG/ML) 5 ML SYRINGE
INTRAMUSCULAR | Status: AC
  Filled 2020-11-28: qty 5

## 2020-11-28 MED ORDER — ROCURONIUM BROMIDE 10 MG/ML (PF) SYRINGE
PREFILLED_SYRINGE | INTRAVENOUS | Status: DC | PRN
  Administered 2020-11-28: 40 mg via INTRAVENOUS

## 2020-11-28 MED ORDER — TRAMADOL HCL 50 MG PO TABS
50.0000 mg | ORAL_TABLET | Freq: Four times a day (QID) | ORAL | Status: DC | PRN
Start: 2020-11-28 — End: 2020-11-30

## 2020-11-28 MED ORDER — FENTANYL CITRATE (PF) 100 MCG/2ML IJ SOLN: 25.0000 ug | INTRAMUSCULAR | Status: DC | PRN

## 2020-11-28 MED ORDER — ONDANSETRON HCL 4 MG/2ML IJ SOLN
INTRAMUSCULAR | Status: AC
  Filled 2020-11-28: qty 2

## 2020-11-28 MED ORDER — ONDANSETRON HCL 4 MG/2ML IJ SOLN: 4.0000 mg | Freq: Once | INTRAMUSCULAR | Status: DC | PRN

## 2020-11-28 MED ORDER — ACETAMINOPHEN 10 MG/ML IV SOLN: 1000.0000 mg | Freq: Once | INTRAVENOUS | Status: DC | PRN

## 2020-11-28 MED ORDER — SUCCINYLCHOLINE CHLORIDE 200 MG/10ML IV SOSY
PREFILLED_SYRINGE | INTRAVENOUS | Status: AC
  Filled 2020-11-28: qty 10

## 2020-11-28 MED ORDER — BUPIVACAINE-EPINEPHRINE (PF) 0.25% -1:200000 IJ SOLN
INTRAMUSCULAR | Status: AC
  Filled 2020-11-28: qty 30

## 2020-11-28 MED ORDER — DEXAMETHASONE SODIUM PHOSPHATE 10 MG/ML IJ SOLN
INTRAMUSCULAR | Status: AC
  Filled 2020-11-28: qty 1

## 2020-11-28 MED ORDER — ONDANSETRON HCL 4 MG/2ML IJ SOLN
INTRAMUSCULAR | Status: DC | PRN
  Administered 2020-11-28: 4 mg via INTRAVENOUS

## 2020-11-28 MED ORDER — MORPHINE SULFATE (PF) 4 MG/ML IV SOLN: 2.0000 mg | INTRAVENOUS | Status: DC | PRN

## 2020-11-28 MED ORDER — OXYCODONE HCL 5 MG/5ML PO SOLN: 5.0000 mg | Freq: Once | ORAL | Status: DC | PRN

## 2020-11-28 MED ORDER — FENTANYL CITRATE (PF) 100 MCG/2ML IJ SOLN
INTRAMUSCULAR | Status: AC
  Filled 2020-11-28: qty 2

## 2020-11-28 MED ORDER — SUCCINYLCHOLINE CHLORIDE 200 MG/10ML IV SOSY
PREFILLED_SYRINGE | INTRAVENOUS | Status: DC | PRN
  Administered 2020-11-28: 100 mg via INTRAVENOUS

## 2020-11-28 MED ORDER — ROCURONIUM BROMIDE 10 MG/ML (PF) SYRINGE
PREFILLED_SYRINGE | INTRAVENOUS | Status: AC
  Filled 2020-11-28: qty 10

## 2020-11-28 MED ORDER — FENTANYL CITRATE (PF) 100 MCG/2ML IJ SOLN
INTRAMUSCULAR | Status: DC | PRN
  Administered 2020-11-28 (×2): 50 ug via INTRAVENOUS

## 2020-11-28 MED ORDER — FENTANYL CITRATE (PF) 100 MCG/2ML IJ SOLN
25.0000 ug | INTRAMUSCULAR | Status: DC | PRN
  Administered 2020-11-28: 25 ug via INTRAVENOUS

## 2020-11-28 MED ORDER — LIDOCAINE 2% (20 MG/ML) 5 ML SYRINGE
INTRAMUSCULAR | Status: DC | PRN
  Administered 2020-11-28: 60 mg via INTRAVENOUS

## 2020-11-28 MED ORDER — CEFAZOLIN SODIUM-DEXTROSE 2-4 GM/100ML-% IV SOLN
2.0000 g | INTRAVENOUS | Status: AC
  Administered 2020-11-28: 2 g via INTRAVENOUS
  Filled 2020-11-28: qty 100

## 2020-11-28 MED ORDER — ACETAMINOPHEN 10 MG/ML IV SOLN
1000.0000 mg | Freq: Once | INTRAVENOUS | Status: AC | PRN
  Administered 2020-11-28: 1000 mg via INTRAVENOUS

## 2020-11-28 MED ORDER — BUPIVACAINE-EPINEPHRINE 0.25% -1:200000 IJ SOLN
INTRAMUSCULAR | Status: DC | PRN
  Administered 2020-11-28: 9 mL

## 2020-11-28 MED ORDER — PROPOFOL 10 MG/ML IV BOLUS
INTRAVENOUS | Status: AC
  Filled 2020-11-28: qty 20

## 2020-11-28 MED ORDER — OXYCODONE HCL 5 MG PO TABS: 5.0000 mg | ORAL_TABLET | Freq: Once | ORAL | Status: DC | PRN

## 2020-11-28 MED ORDER — PROPOFOL 10 MG/ML IV BOLUS
INTRAVENOUS | Status: DC | PRN
  Administered 2020-11-28: 100 mg via INTRAVENOUS

## 2020-11-28 MED ORDER — LACTATED RINGERS IV SOLN: INTRAVENOUS | Status: DC | PRN

## 2020-11-28 MED ORDER — SUGAMMADEX SODIUM 200 MG/2ML IV SOLN
INTRAVENOUS | Status: DC | PRN
  Administered 2020-11-28: 100 mg via INTRAVENOUS

## 2020-11-28 SURGICAL SUPPLY — 55 items
ADH SKN CLS APL DERMABOND .7 (GAUZE/BANDAGES/DRESSINGS) ×1
APL PRP STRL LF DISP 70% ISPRP (MISCELLANEOUS) ×1
APPLIER CLIP 5 13 M/L LIGAMAX5 (MISCELLANEOUS)
APPLIER CLIP ROT 10 11.4 M/L (STAPLE)
APR CLP MED LRG 11.4X10 (STAPLE)
APR CLP MED LRG 5 ANG JAW (MISCELLANEOUS)
CABLE HIGH FREQUENCY MONO STRZ (ELECTRODE) ×2 IMPLANT
CATH GASTROSTOMY 24FR (CATHETERS) ×2 IMPLANT
CHLORAPREP W/TINT 26 (MISCELLANEOUS) ×2 IMPLANT
CLIP APPLIE 5 13 M/L LIGAMAX5 (MISCELLANEOUS) IMPLANT
CLIP APPLIE ROT 10 11.4 M/L (STAPLE) IMPLANT
COVER WAND RF STERILE (DRAPES) IMPLANT
DECANTER SPIKE VIAL GLASS SM (MISCELLANEOUS) ×1 IMPLANT
DERMABOND ADVANCED (GAUZE/BANDAGES/DRESSINGS) ×1
DERMABOND ADVANCED .7 DNX12 (GAUZE/BANDAGES/DRESSINGS) ×1 IMPLANT
DEVICE TROCAR PUNCTURE CLOSURE (ENDOMECHANICALS) ×1 IMPLANT
DRAIN PENROSE 0.5X18 (DRAIN) ×2 IMPLANT
DRAPE UTILITY XL STRL (DRAPES) ×2 IMPLANT
ELECT PENCIL ROCKER SW 15FT (MISCELLANEOUS) ×1 IMPLANT
ELECT REM PT RETURN 15FT ADLT (MISCELLANEOUS) ×2 IMPLANT
G-TUBE MIC 24F 7-10 BALLOON (CATHETERS) IMPLANT
GLOVE SURG ENC MOIS LTX SZ7.5 (GLOVE) ×2 IMPLANT
GOWN STRL REUS W/TWL XL LVL3 (GOWN DISPOSABLE) ×7 IMPLANT
GRASPER SUT TROCAR 14GX15 (MISCELLANEOUS) IMPLANT
KIT BASIN OR (CUSTOM PROCEDURE TRAY) ×2 IMPLANT
KIT TURNOVER KIT A (KITS) ×2 IMPLANT
LEGGING LITHOTOMY PAIR STRL (DRAPES) ×1 IMPLANT
MARKER SKIN DUAL TIP RULER LAB (MISCELLANEOUS) ×2 IMPLANT
NDL INSUFFLATION 14GA 120MM (NEEDLE) ×1 IMPLANT
NEEDLE INSUFFLATION 14GA 120MM (NEEDLE) ×2 IMPLANT
PAD POSITIONING PINK XL (MISCELLANEOUS) IMPLANT
PENCIL SMOKE EVACUATOR (MISCELLANEOUS) IMPLANT
PROTECTOR NERVE ULNAR (MISCELLANEOUS) IMPLANT
RELOAD STAPLE 4.0 BLU F/HERNIA (INSTRUMENTS) IMPLANT
RELOAD STAPLE 4.8 BLK F/HERNIA (STAPLE) IMPLANT
RELOAD STAPLE HERNIA 4.0 BLUE (INSTRUMENTS) IMPLANT
RELOAD STAPLE HERNIA 4.8 BLK (STAPLE) IMPLANT
SCISSORS LAP 5X35 DISP (ENDOMECHANICALS) ×2 IMPLANT
SET IRRIG TUBING LAPAROSCOPIC (IRRIGATION / IRRIGATOR) ×2 IMPLANT
SET TUBE SMOKE EVAC HIGH FLOW (TUBING) ×2 IMPLANT
SHEARS HARMONIC ACE PLUS 36CM (ENDOMECHANICALS) ×1 IMPLANT
SLEEVE XCEL OPT CAN 5 100 (ENDOMECHANICALS) ×6 IMPLANT
STAPLER HERNIA 12 8.5 360D (INSTRUMENTS) ×1 IMPLANT
SUT ETHIBOND 0 36 GRN (SUTURE) ×6 IMPLANT
SUT ETHILON 2 0 PS N (SUTURE) ×1 IMPLANT
SUT MNCRL AB 4-0 PS2 18 (SUTURE) ×2 IMPLANT
SUT NOVA NAB GS-22 2 0 T19 (SUTURE) ×4 IMPLANT
SUT SILK 2 0 SH (SUTURE) ×2 IMPLANT
TAPE CLOTH 4X10 WHT NS (GAUZE/BANDAGES/DRESSINGS) IMPLANT
TOWEL OR 17X26 10 PK STRL BLUE (TOWEL DISPOSABLE) ×2 IMPLANT
TOWEL OR NON WOVEN STRL DISP B (DISPOSABLE) ×2 IMPLANT
TRAY FOLEY MTR SLVR 16FR STAT (SET/KITS/TRAYS/PACK) ×2 IMPLANT
TRAY LAPAROSCOPIC (CUSTOM PROCEDURE TRAY) ×2 IMPLANT
TROCAR BLADELESS OPT 5 100 (ENDOMECHANICALS) ×2 IMPLANT
TROCAR XCEL 12X100 BLDLESS (ENDOMECHANICALS) IMPLANT

## 2020-11-28 NOTE — Plan of Care (Signed)
  Problem: Education: Goal: Knowledge of General Education information will improve Description Including pain rating scale, medication(s)/side effects and non-pharmacologic comfort measures Outcome: Progressing   Problem: Clinical Measurements: Goal: Ability to maintain clinical measurements within normal limits will improve Outcome: Progressing   Problem: Activity: Goal: Risk for activity intolerance will decrease Outcome: Progressing   

## 2020-11-28 NOTE — Plan of Care (Signed)

## 2020-11-28 NOTE — Transfer of Care (Signed)
Immediate Anesthesia Transfer of Care Note  Patient: [name]  Procedure(s) Performed: Gastrostomy tube placement (N/A )  Patient Location: PACU  Anesthesia Type:General  Level of Consciousness: drowsy and patient cooperative  Airway & Oxygen Therapy: Patient Spontanous Breathing and Patient connected to face mask oxygen  Post-op Assessment: Report given to RN and Post -op Vital signs reviewed and stable  Post vital signs: Reviewed and stable  Last Vitals:  Vitals Value Taken Time  BP 127/83 11/28/20 1435  Temp    Pulse 95 11/28/20 1435  Resp 18 11/28/20 1437  SpO2 99 % 11/28/20 1435  Vitals shown include unvalidated device data.  Last Pain:  Vitals:   11/28/20 1208  TempSrc:   PainSc: 0-No pain      Patients Stated Pain Goal: 0 (79/03/83 3383)  Complications: No complications documented.

## 2020-11-28 NOTE — Progress Notes (Signed)
PROGRESS NOTE    [name]  UDJ:[id number] DOB: March 06, 1925 DOA: 11/23/2020 PCP: Gayland Curry, DO    Brief Narrative:  [name] is a 87 year old male with past medical history significant for cognitive impairment, chronic back pain, CKD stage IIIb, hypothyroidism, essential hypertension, GERD who presented to [facility] long ED on 11/23/2020 with reported coffee-ground emesis at his SNF.  Patient denied any pain and only reported some dark substances in his vomitus.  Unable to provide further history due to his cognitive impairment.  In the ED, he was briefly tachycardic but resolved spontaneously and was normotensive on room air.  Hemoglobin was noted to be 6.5 from a baseline around 11-12.  Lactic acid 2.8.  Creatinine 1.62 which is improved from his baseline.  FOBT was positive.  GI was consulted.  Patient was transfused 2 unit PRBC and started on IV PPI drip.  Hospitalist consulted for further evaluation and management of acute blood loss anemia likely secondary to upper GI bleed.   Assessment & Plan:   Principal Problem:   Acute GI bleeding from [facility] ulcerations in giant hiatal hernia Active Problems:   Protein-calorie malnutrition, moderate (HCC)   CKD (chronic kidney disease), stage III (HCC)   Umbilical hernia - reducible   HTN (hypertension)   Hypothyroidism   Cognitive impairment   Incarcerated giant hiatal hernia   HOH (hard of hearing)   Scoliosis of thoracolumbar region due to degenerative disease of spine in adult   Right groin mass c/w sebaceous cyst   [name], acute with bleeding & anemia   IDA (iron deficiency anemia)   Uses roller walker   Acute blood loss anemia Hiatal hernia with [name] erosions Patient presenting to [facility] ED from SNF after reported coffee-ground emesis.  Was found to have a hemoglobin of 6.5; baseline 11-12.  Patient was transfused 2 unit PRBCs and placed on PPI drip.  Gastroenterology was consulted and patient underwent EGD with abnormal  upper GI anatomy with an apparent large prolapsing hiatal hernia with a large deep circumferential ulcer at the GE junction versus proximal stomach; no active bleeding.  CT chest/abdomen/pelvis with large hiatal hernia and multiple loops of nonobstructed small bowel.  General surgery was consulted and patient underwent gastric reduction with gastropexy by Dr. Rosendo Gros on 11/28/2020. --Hgb 6.5>8.5>7.2>8.4>7.6>8.4 --s/p 2u pRBC 11/24/2020 --Protonix 40 mg IV every 12 hours --Clear liquid diet --CBC daily --Transfuse for hemoglobin < 7.5 or active bleeding  Bilateral hydronephrosis and hydroureter Urinary retention Findings of CT abdomen/pelvis with severe bilateral hydronephrosis and hydroureter to the ureterovesicular junctions with severe bladder wall thickening and distention of the urinary bladder with no obstructing calculi or other lesions identified.  Allergy was consulted and seen by Dr. Claudia Desanctis on 11/25/2020; with recommendations of indwelling Foley catheter placement. --Continue Foley catheter, place 11/25/2020 --Monitor urinary output closely --Tamsulosin 0.4 mg p.o. daily --Outpatient follow-up with urology --CMP in am  CKD stage IIIb --Cr 1.49, stable and at baseline  Essential hypertension On amlodipine 2.5 mg p.o. daily at home. --BP 116/77 this morning --Continue to hold home amlodipine pain and monitor BP for now  Hypothyroidism --Levothyroxine 25 mcg p.o. daily  Cognitive impairment --Seroquel 25 mg p.o. twice daily  GERD: Continue PPI   DVT prophylaxis: SCDs   Code Status: DNR Family Communication: Updated patient's daughter who is present at bedside this morning  Disposition Plan:  Level of care: Progressive Status is: Inpatient  Remains inpatient appropriate because:Ongoing diagnostic testing needed not appropriate for outpatient work up,  Unsafe d/c plan, IV treatments appropriate due to intensity of illness or inability to take PO and Inpatient level of care  appropriate due to severity of illness   Dispo: The patient is from: ALF              Anticipated d/c is to: SNF              Patient currently is not medically stable to d/c.   Difficult to place patient No   Consultants:   Gastroenterology  General surgery  Procedures:   Ostomy tube placement and gastropexy, Dr. Rosendo Gros 11/28/2020  Antimicrobials:   Perioperative cefazolin     Subjective: Patient seen examined bedside, resting comfortably.  Awaiting operative management for his hiatal hernia with [name] erosions this afternoon.  Daughter present at bedside and updated.  Patient remains pleasantly confused.  No acute concerns this morning.  Denies headache, no fever/chills/night sweats, no nausea/vomiting/diarrhea, no chest pain, palpitations, no shortness of breath, no abdominal pain, no weakness, no fatigue, no paresthesias.  Objective: Vitals:   11/28/20 1435 11/28/20 1445 11/28/20 1500 11/28/20 1515  BP: 127/83 (!) 155/84 127/75 129/71  Pulse: 95  74 72  Resp: 20 (!) 23 (!) 24 18  Temp: 98.2 F (36.8 C)     TempSrc:      SpO2: 99% 94% 91% 98%  Weight:      Height:        Intake/Output Summary (Last 24 hours) at 11/28/2020 1529 Last data filed at 11/28/2020 1430 Gross per 24 hour  Intake 660 ml  Output 2410 ml  Net -1750 ml   Filed Weights   11/25/20 1256 11/25/20 1448  Weight: 70.1 kg 67.1 kg    Examination:  General exam: Appears calm and comfortable, pleasantly confused Respiratory system: Clear to auscultation. Respiratory effort normal.  On room air Cardiovascular system: S1 & S2 heard, RRR. No JVD, murmurs, rubs, gallops or clicks. No pedal edema. Gastrointestinal system: Abdomen is nondistended, soft and nontender. No organomegaly or masses felt. Normal bowel sounds heard. Central nervous system: Alert. No focal neurological deficits. Extremities: Symmetric 5 x 5 power. Skin: No rashes, lesions or ulcers Psychiatry: Judgement and insight appear  poor. Mood & affect appropriate.     Data Reviewed: I have personally reviewed following labs and imaging studies  CBC: Recent Labs  Lab 11/23/20 1859 11/24/20 1143 11/25/20 0334 11/25/20 1549 11/26/20 0332 11/27/20 0327 11/28/20 0340  WBC 12.8* 11.9* 9.6  --  9.2 8.4 9.2  NEUTROABS 9.6*  --  5.9  --  5.7 5.1 5.8  HGB 6.5* 8.5* 7.6* 7.2* 8.1* 7.6*  8.4* 8.4*  HCT 20.9* 25.9* 24.1* 24.7* 24.8* 23.8*  26.1* 26.9*  MCV 90.5 89.3 92.3  --  90.8 91.3 93.4  PLT 274 231 207  --  240 265 893   Basic Metabolic Panel: Recent Labs  Lab 11/24/20 1143 11/25/20 0334 11/26/20 0332 11/27/20 0327 11/28/20 0340  NA 146* 139 134* 134* 132*  K 3.1* 3.4* 3.9 4.0 3.9  CL 118* 112* 109 106 102  CO2 22 20* 19* 21* 20*  GLUCOSE 100* 92 103* 97 98  BUN 61* 41* 24* 21 17  CREATININE 1.53* 1.41* 1.38* 1.29* 1.49*  CALCIUM 8.0* 7.6* 7.7* 7.9* 7.7*  MG 1.9 1.7 1.7 2.0 1.8  PHOS 2.4* 2.3* 2.6 3.3 3.0   GFR: Estimated Creatinine Clearance: 29.4 mL/min (A) (by C-G formula based on SCr of 1.49 mg/dL (H)). Liver Function Tests: Recent Labs  Lab  11/23/20 1859 11/25/20 0334 11/26/20 0332 11/27/20 0327 11/28/20 0340  AST 20 16 14* 16 15  ALT 18 13 12 14 12   ALKPHOS 58 50 54 60 64  BILITOT 0.9 0.9 1.0 0.9 0.8  PROT 6.1* 5.2* 5.3* 5.6* 5.7*  ALBUMIN 2.9* 2.5* 2.5* 2.5* 2.6*   Recent Labs  Lab 11/23/20 1859  LIPASE 28   No results for input(s): AMMONIA in the last 168 hours. Coagulation Profile: No results for input(s): INR, PROTIME in the last 168 hours. Cardiac Enzymes: No results for input(s): CKTOTAL, CKMB, CKMBINDEX, TROPONINI in the last 168 hours. BNP (last 3 results) No results for input(s): PROBNP in the last 8760 hours. HbA1C: Recent Labs    11/26/20 0332  HGBA1C 5.6   CBG: No results for input(s): GLUCAP in the last 168 hours. Lipid Profile: No results for input(s): CHOL, HDL, LDLCALC, TRIG, CHOLHDL, LDLDIRECT in the last 72 hours. Thyroid Function Tests: Recent  Labs    11/26/20 0332  TSH 3.249   Anemia Panel: No results for input(s): VITAMINB12, FOLATE, FERRITIN, TIBC, IRON, RETICCTPCT in the last 72 hours. Sepsis Labs: Recent Labs  Lab 11/23/20 1900 11/23/20 2125 11/24/20 0200 11/24/20 1143  LATICACIDVEN 2.8* 2.2* 1.8 1.0    Recent Results (from the past 240 hour(s))  SARS Coronavirus 2 by RT PCR (hospital order, performed in [facility] hospital lab)     Status: None   Collection Time: 11/23/20  9:33 PM  Result Value Ref Range Status   SARS Coronavirus 2 NEGATIVE NEGATIVE Final    Comment: (NOTE) SARS-CoV-2 target nucleic acids are NOT DETECTED.  The SARS-CoV-2 RNA is generally detectable in upper and lower respiratory specimens during the acute phase of infection. The lowest concentration of SARS-CoV-2 viral copies this assay can detect is 250 copies / mL. A negative result does not preclude SARS-CoV-2 infection and should not be used as the sole basis for treatment or other patient management decisions.  A negative result may occur with improper specimen collection / handling, submission of specimen other than nasopharyngeal swab, presence of viral mutation(s) within the areas targeted by this assay, and inadequate number of viral copies (<250 copies / mL). A negative result must be combined with clinical observations, patient history, and epidemiological information.  Fact Sheet for Patients:   StrictlyIdeas.no  Fact Sheet for Healthcare Providers: BankingDealers.co.za  This test is not yet approved or  cleared by the [place] FDA and has been authorized for detection and/or diagnosis of SARS-CoV-2 by FDA under an Emergency Use Authorization (EUA).  This EUA will remain in effect (meaning this test can be used) for the duration of the COVID-19 declaration under Section 564(b)(1) of the Act, 21 U.S.C. section 360bbb-3(b)(1), unless the authorization is terminated  or revoked sooner.  Performed at [facility], [place] [address] [place], [place] [address]   Surgical pcr screen     Status: None   Collection Time: 11/27/20  4:18 PM   Specimen: Nasal Mucosa; Nasal Swab  Result Value Ref Range Status   MRSA, PCR NEGATIVE NEGATIVE Final   Staphylococcus aureus NEGATIVE NEGATIVE Final    Comment: (NOTE) The Xpert SA Assay (FDA approved for NASAL specimens in patients 15 years of age and older), is one component of a comprehensive surveillance program. It is not intended to diagnose infection nor to guide or monitor treatment. Performed at [facility], [place] [address], [place], [place] [address]  Radiology Studies: ECHOCARDIOGRAM COMPLETE  Result Date: 11/27/2020    ECHOCARDIOGRAM REPORT   Patient Name:   OMIR COOPRIDER Date of Exam: 11/27/2020 Medical Rec #:  [id number]  Height:       68.0 in Accession #:    [id number] Weight:       148.0 lb Date of Birth:  1924/12/13  BSA:          1.798 m Patient Age:    59 years   BP:           98/68 mmHg Patient Gender: M          HR:           75 bpm. Exam Location:  Inpatient Procedure: 2D Echo, Cardiac Doppler and Color Doppler Indications:    Z01.818 Encounter for other preprocedural examination  History:        Patient has prior history of Echocardiogram examinations, most                 recent 02/27/2014. Risk Factors:Hypertension and Dyslipidemia.                 Scoliosis. GERD.  Sonographer:    Jonelle Sidle Dance Referring Phys: [id number] [place]  1. Left ventricular ejection fraction, by estimation, is 45 to 50%. The left ventricle has mildly decreased function. The left ventricle has no regional wall motion abnormalities. There is mild left ventricular hypertrophy. Left ventricular diastolic parameters are consistent with Grade I diastolic dysfunction (impaired relaxation).  2. Right ventricular systolic function is normal. The right ventricular size is normal.  There is normal pulmonary artery systolic pressure.  3. The mitral valve is grossly normal. Mild mitral valve regurgitation.  4. The aortic valve is normal in structure. Aortic valve regurgitation is not visualized. No aortic stenosis is present.  5. Aortic dilatation noted. There is mild dilatation of the ascending aorta, measuring 41 mm. FINDINGS  Left Ventricle: Left ventricular ejection fraction, by estimation, is 45 to 50%. The left ventricle has mildly decreased function. The left ventricle has no regional wall motion abnormalities. The left ventricular internal cavity size was normal in size. There is mild left ventricular hypertrophy. Left ventricular diastolic parameters are consistent with Grade I diastolic dysfunction (impaired relaxation). Right Ventricle: The right ventricular size is normal. Right vetricular wall thickness was not well visualized. Right ventricular systolic function is normal. There is normal pulmonary artery systolic pressure. The tricuspid regurgitant velocity is 1.77 m/s, and with an assumed right atrial pressure of 3 mmHg, the estimated right ventricular systolic pressure is 62.1 mmHg. Left Atrium: Left atrial size was normal in size. Right Atrium: Right atrial size was normal in size. Pericardium: There is no evidence of pericardial effusion. Mitral Valve: The mitral valve is grossly normal. Mild mitral valve regurgitation. Tricuspid Valve: The tricuspid valve is grossly normal. Tricuspid valve regurgitation is trivial. Aortic Valve: The aortic valve is normal in structure. There is mild to moderate aortic valve annular calcification. Aortic valve regurgitation is not visualized. No aortic stenosis is present. Aortic valve mean gradient measures 4.0 mmHg. Aortic valve peak gradient measures 8.6 mmHg. Aortic valve area, by VTI measures 2.88 cm. Pulmonic Valve: The pulmonic valve was normal in structure. Pulmonic valve regurgitation is mild to moderate. No evidence of pulmonic  stenosis. Aorta: Aortic dilatation noted. There is mild dilatation of the ascending aorta, measuring 41 mm. IAS/Shunts: The atrial septum is grossly normal.  LEFT VENTRICLE PLAX 2D LVIDd:  3.70 cm LVIDs:         2.90 cm LV PW:         1.30 cm LV IVS:        1.30 cm LVOT diam:     2.40 cm LV SV:         86 LV SV Index:   48 LVOT Area:     4.52 cm  RIGHT VENTRICLE          IVC RV Basal diam:  3.80 cm  IVC diam: 1.20 cm RV Mid diam:    2.40 cm TAPSE (M-mode): 2.0 cm LEFT ATRIUM             Index       RIGHT ATRIUM           Index LA diam:        2.80 cm 1.56 cm/m  RA Area:     16.70 cm LA Vol (A2C):   34.2 ml 19.02 ml/m RA Volume:   49.40 ml  27.47 ml/m LA Vol (A4C):   25.7 ml 14.29 ml/m LA Biplane Vol: 29.6 ml 16.46 ml/m  AORTIC VALVE AV Area (Vmax):    2.43 cm AV Area (Vmean):   2.53 cm AV Area (VTI):     2.88 cm AV Vmax:           147.00 cm/s AV Vmean:          93.100 cm/s AV VTI:            0.297 m AV Peak Grad:      8.6 mmHg AV Mean Grad:      4.0 mmHg LVOT Vmax:         78.80 cm/s LVOT Vmean:        52.150 cm/s LVOT VTI:          0.189 m LVOT/AV VTI ratio: 0.64  AORTA Ao Root diam: 3.90 cm Ao Asc diam:  4.10 cm MITRAL VALVE                TRICUSPID VALVE MV Area (PHT): 2.37 cm     TR Peak grad:   12.5 mmHg MV Decel Time: 320 msec     TR Vmax:        177.00 cm/s MV E velocity: 92.70 cm/s MV A velocity: 113.00 cm/s  SHUNTS MV E/A ratio:  0.82         Systemic VTI:  0.19 m                             Systemic Diam: 2.40 cm Mertie Moores MD Electronically signed by Mertie Moores MD Signature Date/Time: 11/27/2020/4:38:03 PM    Final         Scheduled Meds: . [MAR Hold] Chlorhexidine Gluconate Cloth  6 each Topical Q0600  . [MAR Hold] feeding supplement  237 mL Oral BID BM  . fentaNYL      . [MAR Hold] levothyroxine  25 mcg Oral Q0600  . [MAR Hold] pantoprazole (PROTONIX) IV  40 mg Intravenous Q12H  . [MAR Hold] QUEtiapine  25 mg Oral BID  . [MAR Hold] tamsulosin  0.4 mg Oral Daily  .  [MAR Hold] vitamin B-12  500 mcg Oral Daily   Continuous Infusions: . acetaminophen    . acetaminophen 1,000 mg (11/28/20 1444)  . acetaminophen       LOS: 5 days    Time spent: 43 minutes spent  on chart review, discussion with nursing staff, consultants, updating family and interview/physical exam; more than 50% of that time was spent in counseling and/or coordination of care.    Weston Kallman J [place] DO [facility] Available via Epic secure chat 7am-7pm After these hours, please refer to coverage provider listed on [email or web address] 11/28/2020, 3:29 PM

## 2020-11-28 NOTE — Progress Notes (Signed)
Day of Surgery   Subjective/Chief Complaint: PT doing well    Objective: Vital signs in last 24 hours: Temp:  [97.7 F (36.5 C)-99.2 F (37.3 C)] 99.2 F (37.3 C) (04/13 1151) Pulse Rate:  [40-86] 64 (04/13 1151) Resp:  [18] 18 (04/13 1151) BP: (111-134)/(59-77) 134/72 (04/13 1151) SpO2:  [96 %-100 %] 96 % (04/13 1151) Last BM Date: 11/27/20  Intake/Output from previous day: 04/12 0701 - 04/13 0700 In: 780 [P.O.:780] Out: 1800 [Urine:1800] Intake/Output this shift: No intake/output data recorded.  PE:  Constitutional: No acute distress, conversant, appears states age. Eyes: Anicteric sclerae, moist conjunctiva, no lid lag Lungs: Clear to auscultation bilaterally, normal respiratory effort CV: regular rate and rhythm, no murmurs, no peripheral edema, pedal pulses 2+ GI: Soft, no masses or hepatosplenomegaly, non-tender to palpation Skin: No rashes, palpation reveals normal turgor Psychiatric: appropriate judgment and insight, oriented to person, place, and time   Lab Results:  Recent Labs    11/27/20 0327 11/28/20 0340  WBC 8.4 9.2  HGB 7.6*  8.4* 8.4*  HCT 23.8*  26.1* 26.9*  PLT 265 284   BMET Recent Labs    11/27/20 0327 11/28/20 0340  NA 134* 132*  K 4.0 3.9  CL 106 102  CO2 21* 20*  GLUCOSE 97 98  BUN 21 17  CREATININE 1.29* 1.49*  CALCIUM 7.9* 7.7*   PT/INR No results for input(s): LABPROT, INR in the last 72 hours. ABG No results for input(s): PHART, HCO3 in the last 72 hours.  Invalid input(s): PCO2, PO2  Studies/Results: ECHOCARDIOGRAM COMPLETE  Result Date: 11/27/2020    ECHOCARDIOGRAM REPORT   Patient Name:   [name] Date of Exam: 11/27/2020 Medical Rec #:  [id number]  Height:       68.0 in Accession #:    [id number] Weight:       148.0 lb Date of Birth:  07-Jul-1925  BSA:          1.798 m Patient Age:    87 years   BP:           98/68 mmHg Patient Gender: M          HR:           75 bpm. Exam Location:  Inpatient Procedure: 2D Echo,  Cardiac Doppler and Color Doppler Indications:    Z01.818 Encounter for other preprocedural examination  History:        Patient has prior history of Echocardiogram examinations, most                 recent 02/27/2014. Risk Factors:Hypertension and Dyslipidemia.                 Scoliosis. GERD.  Sonographer:    Jonelle Sidle Dance Referring Phys: [id number] [place]  1. Left ventricular ejection fraction, by estimation, is 45 to 50%. The left ventricle has mildly decreased function. The left ventricle has no regional wall motion abnormalities. There is mild left ventricular hypertrophy. Left ventricular diastolic parameters are consistent with Grade I diastolic dysfunction (impaired relaxation).  2. Right ventricular systolic function is normal. The right ventricular size is normal. There is normal pulmonary artery systolic pressure.  3. The mitral valve is grossly normal. Mild mitral valve regurgitation.  4. The aortic valve is normal in structure. Aortic valve regurgitation is not visualized. No aortic stenosis is present.  5. Aortic dilatation noted. There is mild dilatation of the ascending aorta, measuring 41 mm. FINDINGS  Left Ventricle: Left  ventricular ejection fraction, by estimation, is 45 to 50%. The left ventricle has mildly decreased function. The left ventricle has no regional wall motion abnormalities. The left ventricular internal cavity size was normal in size. There is mild left ventricular hypertrophy. Left ventricular diastolic parameters are consistent with Grade I diastolic dysfunction (impaired relaxation). Right Ventricle: The right ventricular size is normal. Right vetricular wall thickness was not well visualized. Right ventricular systolic function is normal. There is normal pulmonary artery systolic pressure. The tricuspid regurgitant velocity is 1.77 m/s, and with an assumed right atrial pressure of 3 mmHg, the estimated right ventricular systolic pressure is 64.3 mmHg. Left Atrium:  Left atrial size was normal in size. Right Atrium: Right atrial size was normal in size. Pericardium: There is no evidence of pericardial effusion. Mitral Valve: The mitral valve is grossly normal. Mild mitral valve regurgitation. Tricuspid Valve: The tricuspid valve is grossly normal. Tricuspid valve regurgitation is trivial. Aortic Valve: The aortic valve is normal in structure. There is mild to moderate aortic valve annular calcification. Aortic valve regurgitation is not visualized. No aortic stenosis is present. Aortic valve mean gradient measures 4.0 mmHg. Aortic valve peak gradient measures 8.6 mmHg. Aortic valve area, by VTI measures 2.88 cm. Pulmonic Valve: The pulmonic valve was normal in structure. Pulmonic valve regurgitation is mild to moderate. No evidence of pulmonic stenosis. Aorta: Aortic dilatation noted. There is mild dilatation of the ascending aorta, measuring 41 mm. IAS/Shunts: The atrial septum is grossly normal.  LEFT VENTRICLE PLAX 2D LVIDd:         3.70 cm LVIDs:         2.90 cm LV PW:         1.30 cm LV IVS:        1.30 cm LVOT diam:     2.40 cm LV SV:         86 LV SV Index:   48 LVOT Area:     4.52 cm  RIGHT VENTRICLE          IVC RV Basal diam:  3.80 cm  IVC diam: 1.20 cm RV Mid diam:    2.40 cm TAPSE (M-mode): 2.0 cm LEFT ATRIUM             Index       RIGHT ATRIUM           Index LA diam:        2.80 cm 1.56 cm/m  RA Area:     16.70 cm LA Vol (A2C):   34.2 ml 19.02 ml/m RA Volume:   49.40 ml  27.47 ml/m LA Vol (A4C):   25.7 ml 14.29 ml/m LA Biplane Vol: 29.6 ml 16.46 ml/m  AORTIC VALVE AV Area (Vmax):    2.43 cm AV Area (Vmean):   2.53 cm AV Area (VTI):     2.88 cm AV Vmax:           147.00 cm/s AV Vmean:          93.100 cm/s AV VTI:            0.297 m AV Peak Grad:      8.6 mmHg AV Mean Grad:      4.0 mmHg LVOT Vmax:         78.80 cm/s LVOT Vmean:        52.150 cm/s LVOT VTI:          0.189 m LVOT/AV VTI ratio: 0.64  AORTA Ao Root diam: 3.90 cm  Ao Asc diam:  4.10 cm MITRAL  VALVE                TRICUSPID VALVE MV Area (PHT): 2.37 cm     TR Peak grad:   12.5 mmHg MV Decel Time: 320 msec     TR Vmax:        177.00 cm/s MV E velocity: 92.70 cm/s MV A velocity: 113.00 cm/s  SHUNTS MV E/A ratio:  0.82         Systemic VTI:  0.19 m                             Systemic Diam: 2.40 cm Mertie Moores MD Electronically signed by Mertie Moores MD Signature Date/Time: 11/27/2020/4:38:03 PM    Final     Anti-infectives: Anti-infectives (From admission, onward)   Start     Dose/Rate Route Frequency Ordered Stop   11/28/20 0830  [MAR Hold]  ceFAZolin (ANCEF) IVPB 2g/100 mL premix        (MAR Hold since Wed 11/28/2020 at 1148.Hold Reason: Transfer to a Procedural area.)   2 g 200 mL/hr over 30 Minutes Intravenous On call to O.R. 11/28/20 0742 11/29/20 0559      Assessment/Plan: 49M incarcerated giant hiatal hernia with [name] ulcerations leading to GIB. CKD HTN UH Hypothyroidism  To OR for lap gtueb and gastropexy I d/w the patient's daughter the risks and benefits of the procedure, to include but not limited to: infection, bleeding, damage to surrounding structures, possible need for further surgery.  She voiced understanding and wishes to proceed.   LOS: 5 days    Ralene Ok 11/28/2020

## 2020-11-28 NOTE — Anesthesia Postprocedure Evaluation (Signed)
Anesthesia Post Note  Patient: [name]  Procedure(s) Performed: Gastrostomy tube placement (N/A )     Patient location during evaluation: PACU Anesthesia Type: General Level of consciousness: awake and alert Pain management: pain level controlled Vital Signs Assessment: post-procedure vital signs reviewed and stable Respiratory status: spontaneous breathing, nonlabored ventilation, respiratory function stable and patient connected to nasal cannula oxygen Cardiovascular status: blood pressure returned to baseline and stable Postop Assessment: no apparent nausea or vomiting Anesthetic complications: no   No complications documented.  Last Vitals:  Vitals:   11/28/20 1515 11/28/20 1530  BP: 129/71 (!) 147/71  Pulse: 72 70  Resp: 18 13  Temp:  36.9 C  SpO2: 98% 98%    Last Pain:  Vitals:   11/28/20 1530  TempSrc:   PainSc: 0-No pain                 Reo Portela S

## 2020-11-28 NOTE — Anesthesia Procedure Notes (Signed)
Procedure Name: Intubation Date/Time: 11/28/2020 1:26 PM Performed by: Montel Clock, CRNA Pre-anesthesia Checklist: Patient identified, Emergency Drugs available, Suction available, Patient being monitored and Timeout performed Patient Re-evaluated:Patient Re-evaluated prior to induction Oxygen Delivery Method: Circle system utilized Preoxygenation: Pre-oxygenation with 100% oxygen Induction Type: IV induction and Rapid sequence Laryngoscope Size: Mac and 3 Grade View: Grade I Tube type: Oral Tube size: 7.5 mm Number of attempts: 1 Airway Equipment and Method: Stylet Placement Confirmation: ETT inserted through vocal cords under direct vision,  positive ETCO2 and breath sounds checked- equal and bilateral Secured at: 23 cm Tube secured with: Tape Dental Injury: Teeth and Oropharynx as per pre-operative assessment

## 2020-11-28 NOTE — Progress Notes (Signed)
OT Cancellation Note  Patient Details Name: [name] MRN: [id number] DOB: Nov 14, 1924   Cancelled Treatment:    Reason Eval/Treat Not Completed: Patient at procedure or test/ unavailable. Patient scheduled for hernia repair today 4/13. Will check back tomorrow as schedule permits.  Delbert Phenix OT OT pager: [phone] 11/28/2020, 6:49 AM

## 2020-11-28 NOTE — Op Note (Signed)
11/28/2020  2:21 PM  PATIENT:  [name]  87 y.o. male  PRE-OPERATIVE DIAGNOSIS:  HIATAL HERNIA, Cameron's ulcers, UGIB  POST-OPERATIVE DIAGNOSIS:  hiatal hernia  PROCEDURE:  Procedure(s) with comments: Gastrostomy tube placement , and gastropexy  SURGEON:  Surgeon(s) and Role:    Ralene Ok, MD - Primary  ASSISTANTS: Caryn Bee, PGY-6   ANESTHESIA:   local and general  EBL:  minimal   BLOOD ADMINISTERED:none  DRAINS: 24Fr. Mic Gastostomy tube  LOCAL MEDICATIONS USED:  BUPIVICAINE   SPECIMEN:  No Specimen  DISPOSITION OF SPECIMEN:  N/A  COUNTS:  YES  TOURNIQUET:  * No tourniquets in log *  DICTATION: .Dragon Dictation We then proceeded to the laparoscopic G-tube placement portion of the procedure.  A verress needle was used to insufflate the abdomen to 38mm of mercury.  This followed by a 54mm trocar and camera.  There was no injury to any intraabdominal organ.    The stomach was easily visualized and there was a large hiatal hernia.  A 66mm trocar was than placed at the umbilicus through his umbilical hernia.  A second 67mm trocar was than placed in the left lower quadrant under direct visualization.  A 2-0 silk was used to place a pursestring stitch at the level of the stomach that was going to come to the anterior abdominal wall.  At this time a 2-0 prolene was than placed at the 3:00, 12, 6, 9 o'clock position to help attache the stomach to the anterior abdominal wall.  A gastrotomy site was than made with the hook cautery.  An incision was made in the skin and a tonsil hemostat was used to introduce the g-tube into the stomach.  The balloon was filled with sterile saline.  The 2-0 prolene was than brought up through the abdominal wall via an Endoclose device and tied down.  This was brought up through the anterior abdominal wall via an Endoclose device and tied down.  The balloon was brought up to the make the stomach snug to the anterior abdominal wall. Just distal  to the gastrostomy tube a 2-0 silk was used to make another gastropexy. This was all done under direct visualization.  The bolster was set tight.  It was secured in place using a 2-0 nylon x 3.  The insuflation was evacuated and all trocars were removed.  The trocar sites were than closed with 4-0 monocryl.  The skin was dressed with steristrips and guaze and tape.    PLAN OF CARE: Admit for inpatient  PATIENT DISPOSITION:  PACU - hemodynamically stable.   Delay start of Pharmacological VTE agent (>24hrs) due to surgical blood loss or risk of bleeding: not applicable

## 2020-11-29 ENCOUNTER — Encounter (HOSPITAL_COMMUNITY): Payer: Self-pay | Admitting: General Surgery

## 2020-11-29 LAB — COMPREHENSIVE METABOLIC PANEL
ALT: 12 U/L (ref 0–44)
AST: 16 U/L (ref 15–41)
Albumin: 2.7 g/dL — ABNORMAL LOW (ref 3.5–5.0)
Alkaline Phosphatase: 68 U/L (ref 38–126)
Anion gap: 9 (ref 5–15)
BUN: 20 mg/dL (ref 8–23)
CO2: 22 mmol/L (ref 22–32)
Calcium: 8.1 mg/dL — ABNORMAL LOW (ref 8.9–10.3)
Chloride: 105 mmol/L (ref 98–111)
Creatinine, Ser: 1.49 mg/dL — ABNORMAL HIGH (ref 0.61–1.24)
GFR, Estimated: 43 mL/min — ABNORMAL LOW (ref >60)
Glucose, Bld: 132 mg/dL — ABNORMAL HIGH (ref 70–99)
Potassium: 4.3 mmol/L (ref 3.5–5.1)
Sodium: 136 mmol/L (ref 135–145)
Total Bilirubin: 0.7 mg/dL (ref 0.3–1.2)
Total Protein: 6.4 g/dL — ABNORMAL LOW (ref 6.5–8.1)

## 2020-11-29 LAB — CBC
HCT: 29.7 % — ABNORMAL LOW (ref 39.0–52.0)
Hemoglobin: 9.4 g/dL — ABNORMAL LOW (ref 13.0–17.0)
MCH: 29.2 pg (ref 26.0–34.0)
MCHC: 31.6 g/dL (ref 30.0–36.0)
MCV: 92.2 fL (ref 80.0–100.0)
Platelets: 262 10*3/uL (ref 150–400)
RBC: 3.22 MIL/uL — ABNORMAL LOW (ref 4.22–5.81)
RDW: 15.8 % — ABNORMAL HIGH (ref 11.5–15.5)
WBC: 12.1 10*3/uL — ABNORMAL HIGH (ref 4.0–10.5)
nRBC: 0 % (ref 0.0–0.2)

## 2020-11-29 LAB — MAGNESIUM: Magnesium: 2 mg/dL (ref 1.7–2.4)

## 2020-11-29 NOTE — Progress Notes (Signed)
Physical Therapy Treatment Patient Details Name: [name] MRN: [id number] DOB: Sep 03, 1924 Today's Date: 11/29/2020    History of Present Illness 87yo male admitted with N/V/D, hiatal hernia, GI bleed. S/P laparoscopic gastrostomy tube placement , and gastropexy 4/13 Dr. Rosendo Gros.  Hx of chronic back pain, OA, falls, shingles, mild memory deficits, BPH, CKD, osteoporosis    PT Comments    Pt reports being sore and remembering he had procedure yesterday.  Pt fearful of "over doing it" but agreeable for at least starting with OOB to recliner today.  Continue to recommend SNF upon d/c.  Follow Up Recommendations  SNF     Equipment Recommendations  None recommended by PT    Recommendations for Other Services       Precautions / Restrictions Precautions Precautions: Fall Precaution Comments: G-tube    Mobility  Bed Mobility Overal bed mobility: Needs Assistance Bed Mobility: Supine to Sit     Supine to sit: Mod assist     General bed mobility comments: Assist for LEs and to scoot to EOB. Increased time. Cues required.    Transfers Overall transfer level: Needs assistance Equipment used: Rolling walker (2 wheeled) Transfers: Sit to/from [facility] Sit to Stand: Min assist Stand pivot transfers: Min assist       General transfer comment: effortful, cues for hand placement, assist to rise and steady  Ambulation/Gait             General Gait Details: pt declined today (doesn't want to "over do it" s/p procedure yesterday)   Stairs             Wheelchair Mobility    Modified Rankin (Stroke Patients Only)       Balance Overall balance assessment: Needs assistance Sitting-balance support: No upper extremity supported Sitting balance-Leahy Scale: Good     Standing balance support: Bilateral upper extremity supported Standing balance-Leahy Scale: Poor                              Cognition Arousal/Alertness:  Awake/alert Behavior During Therapy: WFL for tasks assessed/performed Overall Cognitive Status: Within Functional Limits for tasks assessed                                 General Comments: mild memory deficits as reported in chart. follows commands well.      Exercises      General Comments        Pertinent Vitals/Pain Pain Assessment: Faces Faces Pain Scale: Hurts little more Pain Location: G-tube site Pain Descriptors / Indicators: Discomfort;Sore Pain Intervention(s): Repositioned;Monitored during session    Home Living                      Prior Function            PT Goals (current goals can now be found in the care plan section) Progress towards PT goals: Progressing toward goals    Frequency    Min 2X/week      PT Plan Current plan remains appropriate    Co-evaluation              AM-PAC PT "6 Clicks" Mobility   Outcome Measure  Help needed turning from your back to your side while in a flat bed without using bedrails?: A Lot Help needed moving from lying on your back to sitting on  the side of a flat bed without using bedrails?: A Lot Help needed moving to and from a bed to a chair (including a wheelchair)?: A Lot Help needed standing up from a chair using your arms (e.g., wheelchair or bedside chair)?: A Lot Help needed to walk in hospital room?: Total Help needed climbing 3-5 steps with a railing? : Total 6 Click Score: 10    End of Session Equipment Utilized During Treatment: Gait belt Activity Tolerance: Patient limited by fatigue Patient left: with call bell/phone within reach;in chair;with chair alarm set Nurse Communication: Mobility status PT Visit Diagnosis: Muscle weakness (generalized) (M62.81);Difficulty in walking, not elsewhere classified (R26.2);History of falling (Z91.81)     Time: 1610-9604 PT Time Calculation (min) (ACUTE ONLY): 14 min  Charges:  $Therapeutic Activity: 8-22 mins                     Jannette Spanner PT, DPT Acute Rehabilitation Services Pager: [phone] Office: [phone]  York Ram E 11/29/2020, 1:10 PM

## 2020-11-29 NOTE — Progress Notes (Signed)
[facility] Progress Note  1 Day Post-Op  Subjective: CC:  No complaints. Denies significant abdominal pain. States he had some left sided abdominal pain overnight and had been confused/forgotten for a second that he had surgery but then remembered. Tolerating liquids. Denies BM/flatus since surgery. At baseline he walks with a walker.  Objective: Vital signs in last 24 hours: Temp:  [97.7 F (36.5 C)-99.2 F (37.3 C)] 98.1 F (36.7 C) (04/14 0453) Pulse Rate:  [63-97] 63 (04/14 0453) Resp:  [13-24] 18 (04/14 0453) BP: (114-155)/(65-84) 115/65 (04/14 0453) SpO2:  [91 %-99 %] 97 % (04/14 0453) Last BM Date: 11/27/20  Intake/Output from previous day: 04/13 0701 - 04/14 0700 In: 775 [P.O.:75; I.V.:600; IV Piggyback:100] Out: 1060 [Urine:1050; Blood:10] Intake/Output this shift: Total I/O In: 310 [P.O.:50; Other:260] Out: 575 [Urine:575]  PE: Gen:  Alert, NAD, pleasant Pulm:  Normal effort ORA Abd: Soft, minimally tender, incisions c/d/i, G-tube in tact without bleeding or surrounding skin changes Skin: warm and dry, no rashes  Psych: A&Ox3   Lab Results:  Recent Labs    11/28/20 0340 11/29/20 0338  WBC 9.2 12.1*  HGB 8.4* 9.4*  HCT 26.9* 29.7*  PLT 284 262   BMET Recent Labs    11/28/20 0340 11/29/20 0338  NA 132* 136  K 3.9 4.3  CL 102 105  CO2 20* 22  GLUCOSE 98 132*  BUN 17 20  CREATININE 1.49* 1.49*  CALCIUM 7.7* 8.1*   PT/INR No results for input(s): LABPROT, INR in the last 72 hours. CMP     Component Value Date/Time   NA 136 11/29/2020 0338   NA 141 03/01/2020 0000   K 4.3 11/29/2020 0338   CL 105 11/29/2020 0338   CO2 22 11/29/2020 0338   GLUCOSE 132 (H) 11/29/2020 0338   BUN 20 11/29/2020 0338   BUN 27 (A) 03/01/2020 0000   CREATININE 1.49 (H) 11/29/2020 0338   CALCIUM 8.1 (L) 11/29/2020 0338   PROT 6.4 (L) 11/29/2020 0338   ALBUMIN 2.7 (L) 11/29/2020 0338   AST 16 11/29/2020 0338   ALT 12 11/29/2020 0338   ALKPHOS 68  11/29/2020 0338   BILITOT 0.7 11/29/2020 0338   GFRNONAA 43 (L) 11/29/2020 0338   GFRAA 38.61 03/01/2020 0000   Lipase     Component Value Date/Time   LIPASE 28 11/23/2020 1859       Studies/Results: ECHOCARDIOGRAM COMPLETE  Result Date: 11/27/2020    ECHOCARDIOGRAM REPORT   Patient Name:   [name] Date of Exam: 11/27/2020 Medical Rec #:  [id number]  Height:       68.0 in Accession #:    [id number] Weight:       148.0 lb Date of Birth:  Aug 28, 1924  BSA:          1.798 m Patient Age:    41 years   BP:           98/68 mmHg Patient Gender: M          HR:           75 bpm. Exam Location:  Inpatient Procedure: 2D Echo, Cardiac Doppler and Color Doppler Indications:    Z01.818 Encounter for other preprocedural examination  History:        Patient has prior history of Echocardiogram examinations, most                 recent 02/27/2014. Risk Factors:Hypertension and Dyslipidemia.  Scoliosis. GERD.  Sonographer:    Jonelle Sidle Dance Referring Phys: [id number] [place]  1. Left ventricular ejection fraction, by estimation, is 45 to 50%. The left ventricle has mildly decreased function. The left ventricle has no regional wall motion abnormalities. There is mild left ventricular hypertrophy. Left ventricular diastolic parameters are consistent with Grade I diastolic dysfunction (impaired relaxation).  2. Right ventricular systolic function is normal. The right ventricular size is normal. There is normal pulmonary artery systolic pressure.  3. The mitral valve is grossly normal. Mild mitral valve regurgitation.  4. The aortic valve is normal in structure. Aortic valve regurgitation is not visualized. No aortic stenosis is present.  5. Aortic dilatation noted. There is mild dilatation of the ascending aorta, measuring 41 mm. FINDINGS  Left Ventricle: Left ventricular ejection fraction, by estimation, is 45 to 50%. The left ventricle has mildly decreased function. The left ventricle has no  regional wall motion abnormalities. The left ventricular internal cavity size was normal in size. There is mild left ventricular hypertrophy. Left ventricular diastolic parameters are consistent with Grade I diastolic dysfunction (impaired relaxation). Right Ventricle: The right ventricular size is normal. Right vetricular wall thickness was not well visualized. Right ventricular systolic function is normal. There is normal pulmonary artery systolic pressure. The tricuspid regurgitant velocity is 1.77 m/s, and with an assumed right atrial pressure of 3 mmHg, the estimated right ventricular systolic pressure is 29.4 mmHg. Left Atrium: Left atrial size was normal in size. Right Atrium: Right atrial size was normal in size. Pericardium: There is no evidence of pericardial effusion. Mitral Valve: The mitral valve is grossly normal. Mild mitral valve regurgitation. Tricuspid Valve: The tricuspid valve is grossly normal. Tricuspid valve regurgitation is trivial. Aortic Valve: The aortic valve is normal in structure. There is mild to moderate aortic valve annular calcification. Aortic valve regurgitation is not visualized. No aortic stenosis is present. Aortic valve mean gradient measures 4.0 mmHg. Aortic valve peak gradient measures 8.6 mmHg. Aortic valve area, by VTI measures 2.88 cm. Pulmonic Valve: The pulmonic valve was normal in structure. Pulmonic valve regurgitation is mild to moderate. No evidence of pulmonic stenosis. Aorta: Aortic dilatation noted. There is mild dilatation of the ascending aorta, measuring 41 mm. IAS/Shunts: The atrial septum is grossly normal.  LEFT VENTRICLE PLAX 2D LVIDd:         3.70 cm LVIDs:         2.90 cm LV PW:         1.30 cm LV IVS:        1.30 cm LVOT diam:     2.40 cm LV SV:         86 LV SV Index:   48 LVOT Area:     4.52 cm  RIGHT VENTRICLE          IVC RV Basal diam:  3.80 cm  IVC diam: 1.20 cm RV Mid diam:    2.40 cm TAPSE (M-mode): 2.0 cm LEFT ATRIUM             Index        RIGHT ATRIUM           Index LA diam:        2.80 cm 1.56 cm/m  RA Area:     16.70 cm LA Vol (A2C):   34.2 ml 19.02 ml/m RA Volume:   49.40 ml  27.47 ml/m LA Vol (A4C):   25.7 ml 14.29 ml/m LA Biplane Vol: 29.6 ml  16.46 ml/m  AORTIC VALVE AV Area (Vmax):    2.43 cm AV Area (Vmean):   2.53 cm AV Area (VTI):     2.88 cm AV Vmax:           147.00 cm/s AV Vmean:          93.100 cm/s AV VTI:            0.297 m AV Peak Grad:      8.6 mmHg AV Mean Grad:      4.0 mmHg LVOT Vmax:         78.80 cm/s LVOT Vmean:        52.150 cm/s LVOT VTI:          0.189 m LVOT/AV VTI ratio: 0.64  AORTA Ao Root diam: 3.90 cm Ao Asc diam:  4.10 cm MITRAL VALVE                TRICUSPID VALVE MV Area (PHT): 2.37 cm     TR Peak grad:   12.5 mmHg MV Decel Time: 320 msec     TR Vmax:        177.00 cm/s MV E velocity: 92.70 cm/s MV A velocity: 113.00 cm/s  SHUNTS MV E/A ratio:  0.82         Systemic VTI:  0.19 m                             Systemic Diam: 2.40 cm Mertie Moores MD Electronically signed by Mertie Moores MD Signature Date/Time: 11/27/2020/4:38:03 PM    Final     Anti-infectives: Anti-infectives (From admission, onward)   Start     Dose/Rate Route Frequency Ordered Stop   11/28/20 0830  ceFAZolin (ANCEF) IVPB 2g/100 mL premix        2 g 200 mL/hr over 30 Minutes Intravenous On call to O.R. 11/28/20 0742 11/28/20 1337     Assessment/Plan CKD HTN [facility] Hypothyroidism  69M incarcerated giant hiatal hernia with [name] ulcerations leading to GIB. S/P laparoscopic gastrostomy tube placement , and gastropexy 4/13 Dr. Rosendo Gros - POD#1, afebrile VSS - ADAT to soft - mobilize, PO pain control - G tube care - stable for discharge once mobilizing and tolerating a diet. CCS will follow peripherally over good Friday and the weekend  - follow up arranged.   LOS: 6 days    Obie Dredge, [facility] Please see Amion for pager number during day hours 7:00am-4:30pm

## 2020-11-29 NOTE — Progress Notes (Signed)
PROGRESS NOTE    [name]  LEX:[id number] DOB: 04-Sep-1924 DOA: 11/23/2020 PCP: Gregory Dad, MD    Brief Narrative:  [name] is a 87 year old male with past medical history significant for cognitive impairment, chronic back pain, CKD stage IIIb, hypothyroidism, essential hypertension, GERD who presented to [facility] long ED on 11/23/2020 with reported coffee-ground emesis at his SNF.  Patient denied any pain and only reported some dark substances in his vomitus.  Unable to provide further history due to his cognitive impairment.  In the ED, he was briefly tachycardic but resolved spontaneously and was normotensive on room air.  Hemoglobin was noted to be 6.5 from a baseline around 11-12.  Lactic acid 2.8.  Creatinine 1.62 which is improved from his baseline.  FOBT was positive.  GI was consulted.  Patient was transfused 2 unit PRBC and started on IV PPI drip.  Hospitalist consulted for further evaluation and management of acute blood loss anemia likely secondary to upper GI bleed.   Assessment & Plan:   Principal Problem:   Acute GI bleeding from [facility] ulcerations in giant hiatal hernia Active Problems:   Protein-calorie malnutrition, moderate (HCC)   CKD (chronic kidney disease), stage III (HCC)   Umbilical hernia - reducible   HTN (hypertension)   Hypothyroidism   Cognitive impairment   Incarcerated giant hiatal hernia   HOH (hard of hearing)   Scoliosis of thoracolumbar region due to degenerative disease of spine in adult   Right groin mass c/w sebaceous cyst   [name] ulcer, acute with bleeding & anemia   IDA (iron deficiency anemia)   Uses roller walker   Acute blood loss anemia Incarcerated giant hiatal hernia with [name] erosions Patient presenting to [facility] ED from SNF after reported coffee-ground emesis.  Was found to have a hemoglobin of 6.5; baseline 11-12.  Patient was transfused 2 unit PRBCs and placed on PPI drip.  Gastroenterology was consulted and patient underwent  EGD with abnormal upper GI anatomy with an apparent large prolapsing hiatal hernia with a large deep circumferential ulcer at the GE junction versus proximal stomach; no active bleeding.  CT chest/abdomen/pelvis with large hiatal hernia and multiple loops of nonobstructed small bowel.  General surgery was consulted and patient underwent gastric reduction with gastropexy by Dr. Rosendo Pope on 11/28/2020. --Hgb 6.5>8.5>7.2>8.4>7.6>8.4>9.4 --s/p 2u pRBC 11/24/2020 --Protonix 40 mg IV every 12 hours --Advancing diet to soft today --CBC daily --Transfuse for hemoglobin < 7.5 or active bleeding  Bilateral hydronephrosis and hydroureter Urinary retention Findings of CT abdomen/pelvis with severe bilateral hydronephrosis and hydroureter to the ureterovesicular junctions with severe bladder wall thickening and distention of the urinary bladder with no obstructing calculi or other lesions identified.  Urology was consulted and seen by Dr. Claudia Pope on 11/25/2020; with recommendations of indwelling Foley catheter placement. --Continue Foley catheter, placed 11/25/2020 --Monitor urinary output closely --Tamsulosin 0.4 mg p.o. daily --Outpatient follow-up with urology --CMP in am  CKD stage IIIb --Cr 1.49, stable and at baseline  Essential hypertension On amlodipine 2.5 mg p.o. daily at home. --BP 116/77 this morning --Continue to hold home amlodipine pain and monitor BP for now  Hypothyroidism --Levothyroxine 25 mcg p.o. daily  Cognitive impairment --Seroquel 25 mg p.o. twice daily  GERD: Continue PPI   DVT prophylaxis: SCDs   Code Status: DNR Family Communication: No family present at bedside this morning, updated patient's daughter [name] via telephone this afternoon  Disposition Plan:  Level of care: Progressive Status is: Inpatient  Remains inpatient appropriate because:Ongoing  diagnostic testing needed not appropriate for outpatient work up, Unsafe d/c plan, IV treatments appropriate due to  intensity of illness or inability to take PO and Inpatient level of care appropriate due to severity of illness   Dispo: The patient is from: ALF              Anticipated d/c is to: SNF              Patient currently is not medically stable to d/c.   Difficult to place patient No   Consultants:   Gastroenterology - signed off 4/14  General surgery - signed off 4/14  Procedures:   Gastrostomy tube placement with gastropexy, Dr. Rosendo Pope 11/28/2020  Antimicrobials:   Perioperative cefazolin     Subjective: Patient seen examined bedside, resting comfortably.  Surgery PA present at bedside.  Patient denies any pain.  Advancing diet to soft today.  Updated patient's daughter via telephone.  No other complaints or concerns at this time. Denies headache, no fever/chills/night sweats, no nausea/vomiting/diarrhea, no chest pain, palpitations, no shortness of breath, no abdominal pain, no weakness, no fatigue, no paresthesias.  Objective: Vitals:   11/28/20 1615 11/28/20 1636 11/28/20 2054 11/29/20 0453  BP: (!) 145/80 (!) 141/79 114/68 115/65  Pulse: 73 97 73 63  Resp: 13 15 16 18   Temp: 97.7 F (36.5 C) 98 F (36.7 C) 98.4 F (36.9 C) 98.1 F (36.7 C)  TempSrc:   Oral Oral  SpO2: 98% 96% 97% 97%  Weight:      Height:        Intake/Output Summary (Last 24 hours) at 11/29/2020 1159 Last data filed at 11/29/2020 1000 Gross per 24 hour  Intake 1085 ml  Output 1635 ml  Net -550 ml   Filed Weights   11/25/20 1256 11/25/20 1448  Weight: 70.1 kg 67.1 kg    Examination:  General exam: Appears calm and comfortable, pleasantly confused Respiratory system: Clear to auscultation. Respiratory effort normal.  On room air Cardiovascular system: S1 & S2 heard, RRR. No JVD, murmurs, rubs, gallops or clicks. No pedal edema. Gastrointestinal system: Abdomen is nondistended, soft and nontender. No organomegaly or masses felt. Normal bowel sounds heard.  Gastrostomy tube noted. Central  nervous system: Alert. No focal neurological deficits. Extremities: Symmetric 5 x 5 power. Skin: No rashes, lesions or ulcers Psychiatry: Judgement and insight appear poor. Mood & affect appropriate.     Data Reviewed: I have personally reviewed following labs and imaging studies  CBC: Recent Labs  Lab 11/23/20 1859 11/24/20 1143 11/25/20 0334 11/25/20 1549 11/26/20 0332 11/27/20 0327 11/28/20 0340 11/29/20 0338  WBC 12.8*   < > 9.6  --  9.2 8.4 9.2 12.1*  NEUTROABS 9.6*  --  5.9  --  5.7 5.1 5.8  --   HGB 6.5*   < > 7.6* 7.2* 8.1* 7.6*  8.4* 8.4* 9.4*  HCT 20.9*   < > 24.1* 24.7* 24.8* 23.8*  26.1* 26.9* 29.7*  MCV 90.5   < > 92.3  --  90.8 91.3 93.4 92.2  PLT 274   < > 207  --  240 265 284 262   < > = values in this interval not displayed.   Basic Metabolic Panel: Recent Labs  Lab 11/24/20 1143 11/25/20 0334 11/26/20 0332 11/27/20 0327 11/28/20 0340 11/29/20 0338  NA 146* 139 134* 134* 132* 136  K 3.1* 3.4* 3.9 4.0 3.9 4.3  CL 118* 112* 109 106 102 105  CO2 22 20* 19*  21* 20* 22  GLUCOSE 100* 92 103* 97 98 132*  BUN 61* 41* 24* 21 17 20   CREATININE 1.53* 1.41* 1.38* 1.29* 1.49* 1.49*  CALCIUM 8.0* 7.6* 7.7* 7.9* 7.7* 8.1*  MG 1.9 1.7 1.7 2.0 1.8 2.0  PHOS 2.4* 2.3* 2.6 3.3 3.0  --    GFR: Estimated Creatinine Clearance: 29.4 mL/min (A) (by C-G formula based on SCr of 1.49 mg/dL (H)). Liver Function Tests: Recent Labs  Lab 11/25/20 0334 11/26/20 0332 11/27/20 0327 11/28/20 0340 11/29/20 0338  AST 16 14* 16 15 16   ALT 13 12 14 12 12   ALKPHOS 50 54 60 64 68  BILITOT 0.9 1.0 0.9 0.8 0.7  PROT 5.2* 5.3* 5.6* 5.7* 6.4*  ALBUMIN 2.5* 2.5* 2.5* 2.6* 2.7*   Recent Labs  Lab 11/23/20 1859  LIPASE 28   No results for input(s): AMMONIA in the last 168 hours. Coagulation Profile: No results for input(s): INR, PROTIME in the last 168 hours. Cardiac Enzymes: No results for input(s): CKTOTAL, CKMB, CKMBINDEX, TROPONINI in the last 168 hours. BNP (last 3  results) No results for input(s): PROBNP in the last 8760 hours. HbA1C: No results for input(s): HGBA1C in the last 72 hours. CBG: No results for input(s): GLUCAP in the last 168 hours. Lipid Profile: No results for input(s): CHOL, HDL, LDLCALC, TRIG, CHOLHDL, LDLDIRECT in the last 72 hours. Thyroid Function Tests: No results for input(s): TSH, T4TOTAL, FREET4, T3FREE, THYROIDAB in the last 72 hours. Anemia Panel: No results for input(s): VITAMINB12, FOLATE, FERRITIN, TIBC, IRON, RETICCTPCT in the last 72 hours. Sepsis Labs: Recent Labs  Lab 11/23/20 1900 11/23/20 2125 11/24/20 0200 11/24/20 1143  LATICACIDVEN 2.8* 2.2* 1.8 1.0    Recent Results (from the past 240 hour(s))  SARS Coronavirus 2 by RT PCR (hospital order, performed in [place] hospital lab)     Status: None   Collection Time: 11/23/20  9:33 PM  Result Value Ref Range Status   SARS Coronavirus 2 NEGATIVE NEGATIVE Final    Comment: (NOTE) SARS-CoV-2 target nucleic acids are NOT DETECTED.  The SARS-CoV-2 RNA is generally detectable in upper and lower respiratory specimens during the acute phase of infection. The lowest concentration of SARS-CoV-2 viral copies this assay can detect is 250 copies / mL. A negative result does not preclude SARS-CoV-2 infection and should not be used as the sole basis for treatment or other patient management decisions.  A negative result may occur with improper specimen collection / handling, submission of specimen other than nasopharyngeal swab, presence of viral mutation(s) within the areas targeted by this assay, and inadequate number of viral copies (<250 copies / mL). A negative result must be combined with clinical observations, patient history, and epidemiological information.  Fact Sheet for Patients:   StrictlyIdeas.no  Fact Sheet for Healthcare Providers: BankingDealers.co.za  This test is not yet approved or  cleared by  the [place] FDA and has been authorized for detection and/or diagnosis of SARS-CoV-2 by FDA under an Emergency Use Authorization (EUA).  This EUA will remain in effect (meaning this test can be used) for the duration of the COVID-19 declaration under Section 564(b)(1) of the Act, 21 U.S.C. section 360bbb-3(b)(1), unless the authorization is terminated or revoked sooner.  Performed at [facility], [place] [address] [place], [place] [address]   Surgical pcr screen     Status: None   Collection Time: 11/27/20  4:18 PM   Specimen: Nasal Mucosa; Nasal Swab  Result Value Ref Range  Status   MRSA, PCR NEGATIVE NEGATIVE Final   Staphylococcus aureus NEGATIVE NEGATIVE Final    Comment: (NOTE) The Xpert SA Assay (FDA approved for NASAL specimens in patients 48 years of age and older), is one component of a comprehensive surveillance program. It is not intended to diagnose infection nor to guide or monitor treatment. Performed at [facility], [place] [address] [place], [place] [address]          Radiology Studies: ECHOCARDIOGRAM COMPLETE  Result Date: 11/27/2020    ECHOCARDIOGRAM REPORT   Patient Name:   HAROLD MONCUS Date of Exam: 11/27/2020 Medical Rec #:  [id number]  Height:       68.0 in Accession #:    [id number] Weight:       148.0 lb Date of Birth:  03/26/1925  BSA:          1.798 m Patient Age:    21 years   BP:           98/68 mmHg Patient Gender: M          HR:           75 bpm. Exam Location:  Inpatient Procedure: 2D Echo, Cardiac Doppler and Color Doppler Indications:    Z01.818 Encounter for other preprocedural examination  History:        Patient has prior history of Echocardiogram examinations, most                 recent 02/27/2014. Risk Factors:Hypertension and Dyslipidemia.                 Scoliosis. GERD.  Sonographer:    Jonelle Sidle Dance Referring Phys: [id number] [place]  1. Left ventricular ejection fraction, by estimation, is  45 to 50%. The left ventricle has mildly decreased function. The left ventricle has no regional wall motion abnormalities. There is mild left ventricular hypertrophy. Left ventricular diastolic parameters are consistent with Grade I diastolic dysfunction (impaired relaxation).  2. Right ventricular systolic function is normal. The right ventricular size is normal. There is normal pulmonary artery systolic pressure.  3. The mitral valve is grossly normal. Mild mitral valve regurgitation.  4. The aortic valve is normal in structure. Aortic valve regurgitation is not visualized. No aortic stenosis is present.  5. Aortic dilatation noted. There is mild dilatation of the ascending aorta, measuring 41 mm. FINDINGS  Left Ventricle: Left ventricular ejection fraction, by estimation, is 45 to 50%. The left ventricle has mildly decreased function. The left ventricle has no regional wall motion abnormalities. The left ventricular internal cavity size was normal in size. There is mild left ventricular hypertrophy. Left ventricular diastolic parameters are consistent with Grade I diastolic dysfunction (impaired relaxation). Right Ventricle: The right ventricular size is normal. Right vetricular wall thickness was not well visualized. Right ventricular systolic function is normal. There is normal pulmonary artery systolic pressure. The tricuspid regurgitant velocity is 1.77 m/s, and with an assumed right atrial pressure of 3 mmHg, the estimated right ventricular systolic pressure is 36.6 mmHg. Left Atrium: Left atrial size was normal in size. Right Atrium: Right atrial size was normal in size. Pericardium: There is no evidence of pericardial effusion. Mitral Valve: The mitral valve is grossly normal. Mild mitral valve regurgitation. Tricuspid Valve: The tricuspid valve is grossly normal. Tricuspid valve regurgitation is trivial. Aortic Valve: The aortic valve is normal in structure. There is mild to moderate aortic valve annular  calcification. Aortic valve regurgitation is not visualized.  No aortic stenosis is present. Aortic valve mean gradient measures 4.0 mmHg. Aortic valve peak gradient measures 8.6 mmHg. Aortic valve area, by VTI measures 2.88 cm. Pulmonic Valve: The pulmonic valve was normal in structure. Pulmonic valve regurgitation is mild to moderate. No evidence of pulmonic stenosis. Aorta: Aortic dilatation noted. There is mild dilatation of the ascending aorta, measuring 41 mm. IAS/Shunts: The atrial septum is grossly normal.  LEFT VENTRICLE PLAX 2D LVIDd:         3.70 cm LVIDs:         2.90 cm LV PW:         1.30 cm LV IVS:        1.30 cm LVOT diam:     2.40 cm LV SV:         86 LV SV Index:   48 LVOT Area:     4.52 cm  RIGHT VENTRICLE          IVC RV Basal diam:  3.80 cm  IVC diam: 1.20 cm RV Mid diam:    2.40 cm TAPSE (M-mode): 2.0 cm LEFT ATRIUM             Index       RIGHT ATRIUM           Index LA diam:        2.80 cm 1.56 cm/m  RA Area:     16.70 cm LA Vol (A2C):   34.2 ml 19.02 ml/m RA Volume:   49.40 ml  27.47 ml/m LA Vol (A4C):   25.7 ml 14.29 ml/m LA Biplane Vol: 29.6 ml 16.46 ml/m  AORTIC VALVE AV Area (Vmax):    2.43 cm AV Area (Vmean):   2.53 cm AV Area (VTI):     2.88 cm AV Vmax:           147.00 cm/s AV Vmean:          93.100 cm/s AV VTI:            0.297 m AV Peak Grad:      8.6 mmHg AV Mean Grad:      4.0 mmHg LVOT Vmax:         78.80 cm/s LVOT Vmean:        52.150 cm/s LVOT VTI:          0.189 m LVOT/AV VTI ratio: 0.64  AORTA Ao Root diam: 3.90 cm Ao Asc diam:  4.10 cm MITRAL VALVE                TRICUSPID VALVE MV Area (PHT): 2.37 cm     TR Peak grad:   12.5 mmHg MV Decel Time: 320 msec     TR Vmax:        177.00 cm/s MV E velocity: 92.70 cm/s MV A velocity: 113.00 cm/s  SHUNTS MV E/A ratio:  0.82         Systemic VTI:  0.19 m                             Systemic Diam: 2.40 cm Mertie Moores MD Electronically signed by Mertie Moores MD Signature Date/Time: 11/27/2020/4:38:03 PM    Final          Scheduled Meds: . Chlorhexidine Gluconate Cloth  6 each Topical Q0600  . feeding supplement  237 mL Oral BID BM  . levothyroxine  25 mcg Oral Q0600  . pantoprazole (PROTONIX) IV  40  mg Intravenous Q12H  . QUEtiapine  25 mg Oral BID  . tamsulosin  0.4 mg Oral Daily  . vitamin B-12  500 mcg Oral Daily   Continuous Infusions: . acetaminophen 1,000 mg (11/28/20 1444)     LOS: 6 days    Time spent: 39 minutes spent on chart review, discussion with nursing staff, consultants, updating family and interview/physical exam; more than 50% of that time was spent in counseling and/or coordination of care.    Lavone Weisel J [place] DO [facility] Available via Epic secure chat 7am-7pm After these hours, please refer to coverage provider listed on [email or web address] 11/29/2020, 11:59 AM

## 2020-11-29 NOTE — Progress Notes (Signed)
Occupational Therapy Treatment Patient Details Name: [name] MRN: [id number] DOB: [birth year] Today's Date: 11/29/2020    History of present illness 87yo male admitted with N/V/D, hiatal hernia, GI bleed. S/P laparoscopic gastrostomy tube placement , and gastropexy 4/13 Dr. Rosendo Gros.  Hx of chronic back pain, OA, falls, shingles, mild memory deficits, BPH, CKD, osteoporosis   OT comments  Patient min assist to stand from recliner. Ambulate three feet forward with RW take a seating rest break and return to chair with min guard. No Complaints of pain. Gait improved from prior treatment but distance less. Treatment focused on functional mobility in order to prepare for higher level ADLs out of bed.   Follow Up Recommendations  SNF    Equipment Recommendations  None recommended by OT    Recommendations for Other Services      Precautions / Restrictions Precautions Precautions: Fall Precaution Comments: G-tube, catheter Restrictions Weight Bearing Restrictions: No       Mobility Bed Mobility Overal bed mobility: Needs Assistance Bed Mobility: Supine to Sit     Supine to sit: Mod assist     General bed mobility comments: Patient up in chair    Transfers Overall transfer level: Needs assistance Equipment used: Rolling walker (2 wheeled) Transfers: Sit to/from [facility] Sit to Stand: Min assist Stand pivot transfers: Min assist       General transfer comment: Min assist to stand from the recliner. Min guard to ambulate 3 feet forward with RW. Exhibited heavy breathing and therapist had patient sit at edge of bed. After rest break ambulated back to recliner.    Balance Overall balance assessment: Needs assistance Sitting-balance support: No upper extremity supported Sitting balance-Leahy Scale: Good     Standing balance support: Bilateral upper extremity supported;During functional activity Standing balance-Leahy Scale: Poor Standing balance  comment: reliant on walker                           ADL either performed or assessed with clinical judgement   ADL                                               Vision Patient Visual Report: No change from baseline     Perception     Praxis      Cognition Arousal/Alertness: Awake/alert Behavior During Therapy: WFL for tasks assessed/performed Overall Cognitive Status: Within Functional Limits for tasks assessed                                 General Comments: mild memory deficits as reported in chart. follows commands well.        Exercises     Shoulder Instructions       General Comments      Pertinent Vitals/ Pain       Pain Assessment: No/denies pain Faces Pain Scale: Hurts little more Pain Location: G-tube site Pain Descriptors / Indicators: Discomfort;Sore Pain Intervention(s): Repositioned;Monitored during session  Home Living                                          Prior Functioning/Environment  Frequency  Min 2X/week        Progress Toward Goals  OT Goals(current goals can now be found in the care plan section)  Progress towards OT goals: Progressing toward goals  Acute Rehab OT Goals Patient Stated Goal: to return to ALF OT Goal Formulation: With patient Time For Goal Achievement: 12/09/20 Potential to Achieve Goals: Good  Plan Discharge plan needs to be updated    Co-evaluation          OT goals addressed during session:  (functional mobility)      AM-PAC OT "6 Clicks" Daily Activity     Outcome Measure   Help from another person eating meals?: None Help from another person taking care of personal grooming?: A Little Help from another person toileting, which includes using toliet, bedpan, or urinal?: A Lot Help from another person bathing (including washing, rinsing, drying)?: A Little Help from another person to put on and taking off regular  upper body clothing?: A Little Help from another person to put on and taking off regular lower body clothing?: A Lot 6 Click Score: 17    End of Session Equipment Utilized During Treatment: Rolling walker  OT Visit Diagnosis: Unsteadiness on feet (R26.81);Muscle weakness (generalized) (M62.81)   Activity Tolerance Patient tolerated treatment well   Patient Left in chair;with call bell/phone within reach;with chair alarm set;with family/visitor present   Nurse Communication Mobility status        Time: 1155-2080 OT Time Calculation (min): 13 min  Charges: OT General Charges $OT Visit: 1 Visit OT Treatments $Therapeutic Activity: 8-22 mins  Derl Barrow, OTR/L [place]  Office [phone] Pager: [place] 11/29/2020, 4:08 PM

## 2020-11-29 NOTE — Care Management Important Message (Signed)
Important Message  Patient Details IM Letter placed in Patient's room Name: [name] MRN: [id number] Date of Birth: [birth year]   Medicare Important Message Given:  Yes     Kerin Salen 11/29/2020, 9:10 AM

## 2020-11-30 LAB — SARS CORONAVIRUS 2 (TAT 6-24 HRS): SARS Coronavirus 2: NEGATIVE

## 2020-11-30 LAB — GLUCOSE, CAPILLARY: Glucose-Capillary: 109 mg/dL — ABNORMAL HIGH (ref 70–99)

## 2020-11-30 MED ORDER — PANTOPRAZOLE SODIUM 40 MG PO TBEC: DELAYED_RELEASE_TABLET | ORAL | 11 refills | Status: DC

## 2020-11-30 MED ORDER — TAMSULOSIN HCL 0.4 MG PO CAPS
0.4000 mg | ORAL_CAPSULE | Freq: Every day | ORAL | Status: AC
Start: 2020-11-30 — End: ?

## 2020-11-30 NOTE — Discharge Instructions (Signed)
Indwelling Urinary Catheter Care, Adult An indwelling urinary catheter is a thin, flexible, germ-free (sterile) tube that is placed into the bladder to help drain urine out of the body. The catheter is inserted into the part of the body that drains urine from the bladder (urethra). Urine drains from the catheter into a drainage bag outside of the body. Taking good care of your catheter will keep it working properly and help to prevent problems from developing. What are the risks?  Bacteria may get into your bladder and cause a urinary tract infection.  Urine flow can become blocked. This can happen if the catheter is not working correctly, or if you have sediment or a blood clot in your bladder or the catheter.  Tissue near the catheter may become irritated and bleed. How to wear your catheter and your drainage bag Supplies needed  Adhesive tape or a leg strap.  Alcohol wipe or soap and water (if you use tape).  A clean towel (if you use tape).  Overnight drainage bag.  Smaller drainage bag (leg bag). Wearing your catheter and bag Use adhesive tape or a leg strap to attach your catheter to your leg.  Make sure the catheter is not pulled tight.  If a leg strap gets wet, replace it with a dry one.  If you use adhesive tape: 1. Use an alcohol wipe or soap and water to wash off any stickiness on your skin where you had tape before. 2. Use a clean towel to pat-dry the area. 3. Apply the new tape. You should have received a large overnight drainage bag and a smaller leg bag that fits underneath clothing.  You may wear the overnight bag at any time, but you should not wear the leg bag at night.  Always wear the leg bag below your knee.  Make sure the overnight drainage bag is always lower than the level of your bladder, but do not let it touch the floor. Before you go to sleep, hang the bag inside a wastebasket that is covered by a clean plastic bag. How to care for your skin around  the catheter Supplies needed  A clean washcloth.  Water and mild soap.  A clean towel. Caring for your skin and catheter  Every day, use a clean washcloth and soapy water to clean the skin around your catheter. 1. Wash your hands with soap and water. 2. Wet a washcloth in warm water and mild soap. 3. Clean the skin around your urethra.  If you are male:  Use one hand to gently spread the folds of skin around your vagina (labia).  With the washcloth in your other hand, wipe the inner side of your labia on each side. Do this in a front-to-back direction.  If you are male:  Use one hand to pull back any skin that covers the end of your penis (foreskin).  With the washcloth in your other hand, wipe your penis in small circles. Start wiping at the tip of your penis, then move outward from the catheter.  Move the foreskin back in place, if this applies. 4. With your free hand, hold the catheter close to where it enters your body. Keep holding the catheter during cleaning so it does not get pulled out. 5. Use your other hand to clean the catheter with the washcloth.  Only wipe downward on the catheter.  Do not wipe upward toward your body, because that may push bacteria into your urethra and cause infection. 6.   Use a clean towel to pat-dry the catheter and the skin around it. Make sure to wipe off all soap. 7. Wash your hands with soap and water.  Shower every day. Do not take baths.  Do not use cream, ointment, or lotion on the area where the catheter enters your body, unless your health care provider tells you to do that.  Do not use powders, sprays, or lotions on your genital area.  Check your skin around the catheter every day for signs of infection. Check for: ? Redness, swelling, or pain. ? Fluid or blood. ? Warmth. ? Pus or a bad smell.      How to empty the drainage bag Supplies needed  Rubbing alcohol.  Gauze pad or cotton ball.  Adhesive tape or a leg  strap. Emptying the bag Empty your drainage bag (your overnight drainage bag or your leg bag) when it is ?- full, or at least 2-3 times a day. Clean the drainage bag according to the manufacturer's instructions or as told by your health care provider. 1. Wash your hands with soap and water. 2. Detach the drainage bag from your leg. 3. Hold the drainage bag over the toilet or a clean container. Make sure the drainage bag is lower than your hips and bladder. This stops urine from going back into the tubing and into your bladder. 4. Open the pour spout at the bottom of the bag. 5. Empty the urine into the toilet or container. Do not let the pour spout touch any surface. This precaution is important to prevent bacteria from getting in the bag and causing infection. 6. Apply rubbing alcohol to a gauze pad or cotton ball. 7. Use the gauze pad or cotton ball to clean the pour spout. 8. Close the pour spout. 9. Attach the bag to your leg with adhesive tape or a leg strap. 10. Wash your hands with soap and water. How to change the drainage bag Supplies needed:  Alcohol wipes.  A clean drainage bag.  Adhesive tape or a leg strap. Changing the bag Replace your drainage bag with a clean bag if it leaks, starts to smell bad, or looks dirty. 1. Wash your hands with soap and water. 2. Detach the dirty drainage bag from your leg. 3. Pinch the catheter with your fingers so that urine does not spill out. 4. Disconnect the catheter tube from the drainage tube at the connection valve. Do not let the tubes touch any surface. 5. Clean the end of the catheter tube with an alcohol wipe. Use a different alcohol wipe to clean the end of the drainage tube. 6. Connect the catheter tube to the drainage tube of the clean bag. 7. Attach the clean bag to your leg with adhesive tape or a leg strap. Avoid attaching the new bag too tightly. 8. Wash your hands with soap and water. General instructions  Never pull on  your catheter or try to remove it. Pulling can damage your internal tissues.  Always wash your hands before and after you handle your catheter or drainage bag. Use a mild, fragrance-free soap. If soap and water are not available, use hand sanitizer.  Always make sure there are no twists or bends (kinks) in the catheter tube.  Always make sure there are no leaks in the catheter or drainage bag.  Drink enough fluid to keep your urine pale yellow.  Do not take baths, swim, or use a hot tub.  If you are male, wipe from  front to back after having a bowel movement.   Contact a health care provider if:  Your urine is cloudy.  Your urine smells unusually bad.  Your catheter gets clogged.  Your catheter starts to leak.  Your bladder feels full. Get help right away if:  You have redness, swelling, or pain where the catheter enters your body.  You have fluid, blood, pus, or a bad smell coming from the area where the catheter enters your body.  The area where the catheter enters your body feels warm to the touch.  You have a fever.  You have pain in your abdomen, legs, lower back, or bladder.  You see blood in the catheter.  Your urine is pink or red.  You have nausea, vomiting, or chills.  Your urine is not draining into the bag.  Your catheter gets pulled out. Summary  An indwelling urinary catheter is a thin, flexible, germ-free (sterile) tube that is placed into the bladder to help drain urine out of the body.  The catheter is inserted into the part of the body that drains urine from the bladder (urethra).  Take good care of your catheter to keep it working properly and help prevent problems from developing.  Always wash your hands before and after you handle your catheter or drainage bag.  Never pull on your catheter or try to remove it. This information is not intended to replace advice given to you by your health care provider. Make sure you discuss any questions  you have with your health care provider. Document Revised: 10/10/2019 Document Reviewed: 03/20/2017 Else[employer] Patient Education  2021 [employer] LAPAROSCOPIC SURGERY: POST OP INSTRUCTIONS Always review your discharge instruction sheet given to you by the facility where your surgery was performed. IF YOU HAVE DISABILITY OR FAMILY LEAVE FORMS, YOU MUST BRING THEM TO THE OFFICE FOR PROCESSING.   DO NOT GIVE THEM TO YOUR DOCTOR.  PAIN CONTROL  1. First take acetaminophen (Tylenol) AND/or ibuprofen (Advil) to control your pain after surgery.  Follow directions on package.  Taking acetaminophen (Tylenol) and/or ibuprofen (Advil) regularly after surgery will help to control your pain and lower the amount of prescription pain medication you may need.  You should not take more than 3,000 mg (3 grams) of acetaminophen (Tylenol) in 24 hours.  You should not take ibuprofen (Advil), aleve, motrin, naprosyn or other NSAIDS if you have a history of stomach ulcers or chronic kidney disease.  2. A prescription for pain medication may be given to you upon discharge.  Take your pain medication as prescribed, if you still have uncontrolled pain after taking acetaminophen (Tylenol) or ibuprofen (Advil). 3. Use ice packs to help control pain. 4. If you need a refill on your pain medication, please contact your pharmacy.  They will contact our office to request authorization. Prescriptions will not be filled after 5pm or on week-ends.  HOME MEDICATIONS 5. Take your usually prescribed medications unless otherwise directed.  DIET 6. You should follow a light diet the first few days after arrival home.  Be sure to include lots of fluids daily. Avoid fatty, fried foods.   CONSTIPATION 7. It is common to experience some constipation after surgery and if you are taking pain medication.  Increasing fluid intake and taking a stool softener (such as Colace) will usually help or prevent  this problem from occurring.  A mild laxative (Milk of Magnesia or Miralax) should be taken according to package instructions if  there are no bowel movements after 48 hours.  WOUND/INCISION CARE 8. Most patients will experience some swelling and bruising in the area of the incisions.  Ice packs will help.  Swelling and bruising can take several days to resolve.  9. Unless discharge instructions indicate otherwise, follow guidelines below  a. STERI-STRIPS - you may remove your outer bandages 48 hours after surgery, and you may shower at that time.  You have steri-strips (small skin tapes) in place directly over the incision.  These strips should be left on the skin for 7-10 days.   b. DERMABOND/SKIN GLUE - you may shower in 24 hours.  The glue will flake off over the next 2-3 weeks. 10. Any sutures or staples will be removed at the office during your follow-up visit.  ACTIVITIES 11. You may resume regular (light) daily activities beginning the next day--such as daily self-care, walking, climbing stairs--gradually increasing activities as tolerated.  You may have sexual intercourse when it is comfortable.  Refrain from any heavy lifting or straining until approved by your doctor. a. You may drive when you are no longer taking prescription pain medication, you can comfortably wear a seatbelt, and you can safely maneuver your car and apply brakes.  FOLLOW-UP 12. You should see your doctor in the office for a follow-up appointment approximately 2-3 weeks after your surgery.  You should have been given your post-op/follow-up appointment when your surgery was scheduled.  If you did not receive a post-op/follow-up appointment, make sure that you call for this appointment within a day or two after you arrive home to insure a convenient appointment time.  WHEN TO CALL YOUR DOCTOR: 1. Fever over 101.0 2. Inability to urinate 3. Continued bleeding from incision. 4. Increased pain, redness, or drainage from  the incision. 5. Increasing abdominal pain  The clinic staff is available to answer your questions during regular business hours.  Please don't hesitate to call and ask to speak to one of the nurses for clinical concerns.  If you have a medical emergency, go to the nearest emergency room or call 911.  A surgeon from [name] is always on call at the hospital. [address] [place], [place], [place]  [address] ? [address], [place], [place]   [address] [phone] ? [phone] ? FAX [phone] Web site: [email or web address]

## 2020-11-30 NOTE — Discharge Summary (Signed)
Physician Discharge Summary  [name] WYO:[id number] DOB: 09-17-1924 DOA: 11/23/2020  PCP: [name], MD  Admit date: 11/23/2020 Discharge date: 11/30/2020  [facility] Disposition: [facility]  Recommendations for Outpatient Follow-up:  1. Follow up with PCP in 1-2 weeks 2. Follow-up with general surgery as scheduled 3. Follow-up with urology, Dr. Claudia Desanctis in 3 weeks for urinary retention 4. Continues with Foley catheter and started on Flomax 5. Repeat CBC 1 week   Discharge Condition: Stable CODE STATUS: DNR Diet recommendation: Heart healthy diet  History of present illness:  [name] is a 87 year old male with past medical history significant for cognitive impairment, chronic back pain, CKD stage IIIb, hypothyroidism, essential hypertension, GERD who presented to [facility] long ED on 11/23/2020 with reported coffee-ground emesis at his SNF.  Patient denied any pain and only reported some dark substances in his vomitus.  Unable to provide further history due to his cognitive impairment.  In the ED, he was briefly tachycardic but resolved spontaneously and was normotensive on room air.  Hemoglobin was noted to be 6.5 from a baseline around 11-12.  Lactic acid 2.8.  Creatinine 1.62 which is improved from his baseline.  FOBT was positive.  GI was consulted.  Patient was transfused 2 unit PRBC and started on IV PPI drip.  Hospitalist consulted for further evaluation and management of acute blood loss anemia likely secondary to upper GI bleed.  Hospital course:  Acute blood loss anemia Incarcerated giant hiatal hernia with [name] erosions Patient presenting to [facility] ED from SNF after reported coffee-ground emesis.  Was found to have a hemoglobin of 6.5; baseline 11-12.  Patient was transfused 2 unit PRBCs on 11/24/2020 and placed on PPI drip.  Gastroenterology was consulted and patient underwent EGD with abnormal upper GI anatomy with an apparent large  prolapsing hiatal hernia with a large deep circumferential ulcer at the GE junction versus proximal stomach; no active bleeding.  CT chest/abdomen/pelvis with large hiatal hernia and multiple loops of nonobstructed small bowel.  General surgery was consulted and patient underwent gastric reduction with gastropexy by Dr. Rosendo Gros on 11/28/2020.  Hemoglobin remained stable, 9.4 at time of discharge.  Continue Protonix 40 mg p.o. twice daily x30 days followed by once daily.  Repeat CBC 1 week.  Bilateral hydronephrosis and hydroureter Urinary retention Findings of CT abdomen/pelvis with severe bilateral hydronephrosis and hydroureter to the ureterovesicular junctions with severe bladder wall thickening and distention of the urinary bladder with no obstructing calculi or other lesions identified.  Urology was consulted and seen by Dr. Claudia Desanctis on 11/25/2020; with recommendations of indwelling Foley catheter placement.  Started on tamsulosin 0.4 mg p.o. daily.  Outpatient follow-up with urology 3 weeks.  CKD stage IIIb Cr 1.49, stable and at baseline  Essential hypertension On amlodipine 2.5 mg p.o. daily at home.  Hypothyroidism Levothyroxine 25 mcg p.o. daily  Cognitive impairment Seroquel 25 mg p.o. twice daily  GERD: Continue PPI as above twice daily for 30 days followed by once daily  Discharge Diagnoses:  Active Problems:   Protein-calorie malnutrition, moderate (HCC)   CKD (chronic kidney disease), stage III (HCC)   HTN (hypertension)   Hypothyroidism   Cognitive impairment   HOH (hard of hearing)   Scoliosis of thoracolumbar region due to degenerative disease of spine in adult   Right groin mass c/w sebaceous cyst   [name], acute with bleeding & anemia   IDA (iron deficiency anemia)   Uses roller walker  Discharge Instructions  Discharge Instructions    Call MD for:  difficulty breathing, headache or visual disturbances   Complete by: As directed    Call MD for:   extreme fatigue   Complete by: As directed    Call MD for:  persistant dizziness or light-headedness   Complete by: As directed    Call MD for:  persistant nausea and vomiting   Complete by: As directed    Call MD for:  severe uncontrolled pain   Complete by: As directed    Call MD for:  temperature >100.4   Complete by: As directed    Continue foley catheter   Complete by: As directed    Diet - low sodium heart healthy   Complete by: As directed    Increase activity slowly   Complete by: As directed    No wound care   Complete by: As directed      Allergies as of 11/30/2020      Reactions   Amrix [cyclobenzaprine]    Shellfish Allergy       Medication List    STOP taking these medications   loperamide 2 MG capsule Commonly known as: IMODIUM     TAKE these medications   acetaminophen 325 MG tablet Commonly known as: TYLENOL Take 650 mg by mouth 2 (two) times daily.   acetaminophen 325 MG tablet Commonly known as: TYLENOL Take 650 mg by mouth 2 (two) times daily as needed.   amLODipine 2.5 MG tablet Commonly known as: NORVASC Take 1 tablet by mouth daily.   antiseptic oral rinse Liqd 15 mLs by Mouth Rinse route 4 (four) times daily as needed for dry mouth.   bisacodyl 10 MG suppository Commonly known as: DULCOLAX Place 10 mg rectally as needed for moderate constipation.   lactose free nutrition Liqd Take 237 mLs by mouth 2 (two) times daily between meals.   levothyroxine 25 MCG tablet Commonly known as: SYNTHROID Take 1 tablet (25 mcg total) by mouth daily before breakfast.   Melatonin 5 MG Caps Take 1 capsule (5 mg total) by mouth at bedtime.   ondansetron 4 MG tablet Commonly known as: ZOFRAN Take 4 mg by mouth every 6 (six) hours as needed for nausea or vomiting.   pantoprazole 40 MG tablet Commonly known as: PROTONIX Take 1 tablet (40 mg total) by mouth 2 (two) times daily for 30 days, THEN 1 tablet (40 mg total) daily. Start taking on: November 30, 2020 What changed: See the new instructions.   QUEtiapine 25 MG tablet Commonly known as: SEROQUEL Take 25 mg by mouth 2 (two) times daily.   Systane 0.4-0.3 % Soln Generic drug: Polyethyl Glycol-Propyl Glycol Two drops both eyes three times daily as needed for dry itchy eyes.   tamsulosin 0.4 MG Caps capsule Commonly known as: FLOMAX Take 1 capsule (0.4 mg total) by mouth daily.   triamcinolone cream 0.1 % Commonly known as: KENALOG Apply 1 application topically as needed.   vitamin B-12 1000 MCG tablet Commonly known as: CYANOCOBALAMIN Take 500 mcg by mouth daily.       Follow-up Information    Ralene Ok, MD. Go on 12/17/2020.   Specialty: General Surgery Why: at 10:00 AM for post-operative follow up from your recent surgery. please arrive by 9:30 AM to get checked in and fill out any necessary paperwork. Contact information: [address] [place] [address] [phone]        [name], MD.  Schedule an appointment as soon as possible for a visit in 1 week(s).   Specialty: Internal Medicine Contact information: [place] [address] [phone]        Robley Fries, MD. Schedule an appointment as soon as possible for a visit in 3 week(s).   Specialty: Urology Contact information: [address] [place] [address] [phone]              Allergies  Allergen Reactions  . Amrix [Cyclobenzaprine]   . Shellfish Allergy     Consultations:  Gastroenterology  General surgery  Urology   Procedures/Studies: CT CHEST W CONTRAST  Result Date: 11/24/2020 CLINICAL DATA:  Hematemesis, suspect large hiatal hernia EXAM: CT CHEST, ABDOMEN, AND PELVIS WITH CONTRAST TECHNIQUE: Multidetector CT imaging of the chest, abdomen and pelvis was performed following the standard protocol during bolus administration of intravenous contrast. CONTRAST:  58mL OMNIPAQUE IOHEXOL 300 MG/ML SOLN, additional  oral enteric contrast COMPARISON:  None. FINDINGS: CT CHEST FINDINGS Cardiovascular: Aortic atherosclerosis. Normal heart size. Scattered coronary artery calcifications. No pericardial effusion. Mediastinum/Nodes: No enlarged mediastinal, hilar, or axillary lymph nodes. Large hiatal hernia with complete intrathoracic position of the stomach as well as portions of the pancreatic head and neck and multiple loops of nonobstructed small bowel. Thyroid gland, trachea, and esophagus demonstrate no significant findings. Lungs/Pleura: Trace bilateral pleural effusions. Compressive atelectasis of the right lower lobe associated with a large hiatal hernia. Musculoskeletal: No chest wall mass or suspicious bone lesions identified. CT ABDOMEN PELVIS FINDINGS Hepatobiliary: No solid liver abnormality is seen. No gallstones, gallbladder wall thickening, or biliary dilatation. Pancreas: Unremarkable. No pancreatic ductal dilatation or surrounding inflammatory changes. Spleen: Normal in size without significant abnormality. Adrenals/Urinary Tract: Adrenal glands are unremarkable. Severe bilateral hydronephrosis and hydroureter to the ureterovesicular junctions. There is severe urinary bladder wall thickening and distention of the urinary bladder. Stomach/Bowel: Stomach is within normal limits. Appendix appears normal. No evidence of bowel wall thickening, distention, or inflammatory changes. Vascular/Lymphatic: Aortic atherosclerosis. No enlarged abdominal or pelvic lymph nodes. Reproductive: TURP defect of the prostate. Other: There is a midline epigastric hernia containing multiple loops of nonobstructed small bowel (series 2, image 72). No abdominopelvic ascites. Musculoskeletal: No acute or significant osseous findings. IMPRESSION: 1. Large hiatal hernia with complete intrathoracic position of the stomach as well as portions of the pancreatic head and neck and multiple loops of nonobstructed small bowel. 2. Trace bilateral  pleural effusions. Compressive atelectasis of the right lower lobe associated with a large hiatal hernia. 3. Severe bilateral hydronephrosis and hydroureter to the ureterovesicular junctions. There is severe urinary bladder wall thickening and distention of the urinary bladder. No obstructing calculi or other lesion identified. 4. TURP defect of the prostate. 5. There is a midline epigastric hernia containing multiple loops of nonobstructed small bowel. 6. Coronary artery disease. Aortic Atherosclerosis (ICD10-I70.0). Electronically Signed   By: Eddie Candle M.D.   On: 11/24/2020 23:27   CT ABDOMEN PELVIS W CONTRAST  Result Date: 11/24/2020 CLINICAL DATA:  Hematemesis, suspect large hiatal hernia EXAM: CT CHEST, ABDOMEN, AND PELVIS WITH CONTRAST TECHNIQUE: Multidetector CT imaging of the chest, abdomen and pelvis was performed following the standard protocol during bolus administration of intravenous contrast. CONTRAST:  17mL OMNIPAQUE IOHEXOL 300 MG/ML SOLN, additional oral enteric contrast COMPARISON:  None. FINDINGS: CT CHEST FINDINGS Cardiovascular: Aortic atherosclerosis. Normal heart size. Scattered coronary artery calcifications. No pericardial effusion. Mediastinum/Nodes: No enlarged mediastinal, hilar, or axillary lymph nodes. Large hiatal hernia  with complete intrathoracic position of the stomach as well as portions of the pancreatic head and neck and multiple loops of nonobstructed small bowel. Thyroid gland, trachea, and esophagus demonstrate no significant findings. Lungs/Pleura: Trace bilateral pleural effusions. Compressive atelectasis of the right lower lobe associated with a large hiatal hernia. Musculoskeletal: No chest wall mass or suspicious bone lesions identified. CT ABDOMEN PELVIS FINDINGS Hepatobiliary: No solid liver abnormality is seen. No gallstones, gallbladder wall thickening, or biliary dilatation. Pancreas: Unremarkable. No pancreatic ductal dilatation or surrounding inflammatory  changes. Spleen: Normal in size without significant abnormality. Adrenals/Urinary Tract: Adrenal glands are unremarkable. Severe bilateral hydronephrosis and hydroureter to the ureterovesicular junctions. There is severe urinary bladder wall thickening and distention of the urinary bladder. Stomach/Bowel: Stomach is within normal limits. Appendix appears normal. No evidence of bowel wall thickening, distention, or inflammatory changes. Vascular/Lymphatic: Aortic atherosclerosis. No enlarged abdominal or pelvic lymph nodes. Reproductive: TURP defect of the prostate. Other: There is a midline epigastric hernia containing multiple loops of nonobstructed small bowel (series 2, image 72). No abdominopelvic ascites. Musculoskeletal: No acute or significant osseous findings. IMPRESSION: 1. Large hiatal hernia with complete intrathoracic position of the stomach as well as portions of the pancreatic head and neck and multiple loops of nonobstructed small bowel. 2. Trace bilateral pleural effusions. Compressive atelectasis of the right lower lobe associated with a large hiatal hernia. 3. Severe bilateral hydronephrosis and hydroureter to the ureterovesicular junctions. There is severe urinary bladder wall thickening and distention of the urinary bladder. No obstructing calculi or other lesion identified. 4. TURP defect of the prostate. 5. There is a midline epigastric hernia containing multiple loops of nonobstructed small bowel. 6. Coronary artery disease. Aortic Atherosclerosis (ICD10-I70.0). Electronically Signed   By: Eddie Candle M.D.   On: 11/24/2020 23:27   ECHOCARDIOGRAM COMPLETE  Result Date: 11/27/2020    ECHOCARDIOGRAM REPORT   Patient Name:   [name] Date of Exam: 11/27/2020 Medical Rec #:  [id number]  Height:       68.0 in Accession #:    [id number] Weight:       148.0 lb Date of Birth:  11-Nov-1924  BSA:          1.798 m Patient Age:    77 years   BP:           98/68 mmHg Patient Gender: M          HR:            75 bpm. Exam Location:  Inpatient Procedure: 2D Echo, Cardiac Doppler and Color Doppler Indications:    Z01.818 Encounter for other preprocedural examination  History:        Patient has prior history of Echocardiogram examinations, most                 recent 02/27/2014. Risk Factors:Hypertension and Dyslipidemia.                 Scoliosis. GERD.  Sonographer:    Jonelle Sidle Dance Referring Phys: [id number] [place]  1. Left ventricular ejection fraction, by estimation, is 45 to 50%. The left ventricle has mildly decreased function. The left ventricle has no regional wall motion abnormalities. There is mild left ventricular hypertrophy. Left ventricular diastolic parameters are consistent with Grade I diastolic dysfunction (impaired relaxation).  2. Right ventricular systolic function is normal. The right ventricular size is normal. There is normal pulmonary artery systolic pressure.  3. The mitral valve is grossly normal. Mild mitral valve  regurgitation.  4. The aortic valve is normal in structure. Aortic valve regurgitation is not visualized. No aortic stenosis is present.  5. Aortic dilatation noted. There is mild dilatation of the ascending aorta, measuring 41 mm. FINDINGS  Left Ventricle: Left ventricular ejection fraction, by estimation, is 45 to 50%. The left ventricle has mildly decreased function. The left ventricle has no regional wall motion abnormalities. The left ventricular internal cavity size was normal in size. There is mild left ventricular hypertrophy. Left ventricular diastolic parameters are consistent with Grade I diastolic dysfunction (impaired relaxation). Right Ventricle: The right ventricular size is normal. Right vetricular wall thickness was not well visualized. Right ventricular systolic function is normal. There is normal pulmonary artery systolic pressure. The tricuspid regurgitant velocity is 1.77 m/s, and with an assumed right atrial pressure of 3 mmHg, the estimated right  ventricular systolic pressure is 82.9 mmHg. Left Atrium: Left atrial size was normal in size. Right Atrium: Right atrial size was normal in size. Pericardium: There is no evidence of pericardial effusion. Mitral Valve: The mitral valve is grossly normal. Mild mitral valve regurgitation. Tricuspid Valve: The tricuspid valve is grossly normal. Tricuspid valve regurgitation is trivial. Aortic Valve: The aortic valve is normal in structure. There is mild to moderate aortic valve annular calcification. Aortic valve regurgitation is not visualized. No aortic stenosis is present. Aortic valve mean gradient measures 4.0 mmHg. Aortic valve peak gradient measures 8.6 mmHg. Aortic valve area, by VTI measures 2.88 cm. Pulmonic Valve: The pulmonic valve was normal in structure. Pulmonic valve regurgitation is mild to moderate. No evidence of pulmonic stenosis. Aorta: Aortic dilatation noted. There is mild dilatation of the ascending aorta, measuring 41 mm. IAS/Shunts: The atrial septum is grossly normal.  LEFT VENTRICLE PLAX 2D LVIDd:         3.70 cm LVIDs:         2.90 cm LV PW:         1.30 cm LV IVS:        1.30 cm LVOT diam:     2.40 cm LV SV:         86 LV SV Index:   48 LVOT Area:     4.52 cm  RIGHT VENTRICLE          IVC RV Basal diam:  3.80 cm  IVC diam: 1.20 cm RV Mid diam:    2.40 cm TAPSE (M-mode): 2.0 cm LEFT ATRIUM             Index       RIGHT ATRIUM           Index LA diam:        2.80 cm 1.56 cm/m  RA Area:     16.70 cm LA Vol (A2C):   34.2 ml 19.02 ml/m RA Volume:   49.40 ml  27.47 ml/m LA Vol (A4C):   25.7 ml 14.29 ml/m LA Biplane Vol: 29.6 ml 16.46 ml/m  AORTIC VALVE AV Area (Vmax):    2.43 cm AV Area (Vmean):   2.53 cm AV Area (VTI):     2.88 cm AV Vmax:           147.00 cm/s AV Vmean:          93.100 cm/s AV VTI:            0.297 m AV Peak Grad:      8.6 mmHg AV Mean Grad:      4.0 mmHg LVOT Vmax:  78.80 cm/s LVOT Vmean:        52.150 cm/s LVOT VTI:          0.189 m LVOT/AV VTI ratio: 0.64   AORTA Ao Root diam: 3.90 cm Ao Asc diam:  4.10 cm MITRAL VALVE                TRICUSPID VALVE MV Area (PHT): 2.37 cm     TR Peak grad:   12.5 mmHg MV Decel Time: 320 msec     TR Vmax:        177.00 cm/s MV E velocity: 92.70 cm/s MV A velocity: 113.00 cm/s  SHUNTS MV E/A ratio:  0.82         Systemic VTI:  0.19 m                             Systemic Diam: 2.40 cm Mertie Moores MD Electronically signed by Mertie Moores MD Signature Date/Time: 11/27/2020/4:38:03 PM    Final       Subjective: Patient seen and examined at bedside, resting comfortably.  Eating breakfast.  No complaints this morning.  Pleasantly confused.  Ready for discharge back to [facility] today.  Denies headache, no chest pain, palpitation, no shortness of breath, no abdominal pain.  No acute concerns overnight per nursing staff.  Discharge Exam: Vitals:   11/29/20 2133 11/30/20 0635  BP: (!) 144/70 120/64  Pulse: 79 64  Resp: (!) 22 18  Temp: 98.2 F (36.8 C) 98.2 F (36.8 C)  SpO2: 95% 96%   Vitals:   11/29/20 0453 11/29/20 1302 11/29/20 2133 11/30/20 0635  BP: 115/65 101/60 (!) 144/70 120/64  Pulse: 63 89 79 64  Resp: 18 18 (!) 22 18  Temp: 98.1 F (36.7 C) 98.2 F (36.8 C) 98.2 F (36.8 C) 98.2 F (36.8 C)  TempSrc: Oral Oral Oral Oral  SpO2: 97% 99% 95% 96%  Weight:      Height:        General: Pt is alert, awake, not in acute distress, pleasantly confused Cardiovascular: RRR, S1/S2 +, no rubs, no gallops Respiratory: CTA bilaterally, no wheezing, no rhonchi, on room air Abdominal: Soft, NT, ND, bowel sounds +, gastrostomy tube noted GU: Foley catheter noted draining clear yellow urine Extremities: no edema, no cyanosis    The results of significant diagnostics from this hospitalization (including imaging, microbiology, ancillary and laboratory) are listed below for reference.     Microbiology: Recent Results (from the past 240 hour(s))  SARS Coronavirus 2 by RT PCR (hospital order, performed in  [place] hospital lab)     Status: None   Collection Time: 11/23/20  9:33 PM  Result Value Ref Range Status   SARS Coronavirus 2 NEGATIVE NEGATIVE Final    Comment: (NOTE) SARS-CoV-2 target nucleic acids are NOT DETECTED.  The SARS-CoV-2 RNA is generally detectable in upper and lower respiratory specimens during the acute phase of infection. The lowest concentration of SARS-CoV-2 viral copies this assay can detect is 250 copies / mL. A negative result does not preclude SARS-CoV-2 infection and should not be used as the sole basis for treatment or other patient management decisions.  A negative result may occur with improper specimen collection / handling, submission of specimen other than nasopharyngeal swab, presence of viral mutation(s) within the areas targeted by this assay, and inadequate number of viral copies (<250 copies / mL). A negative result must be combined with clinical observations,  patient history, and epidemiological information.  Fact Sheet for Patients:   StrictlyIdeas.no  Fact Sheet for Healthcare Providers: BankingDealers.co.za  This test is not yet approved or  cleared by the [place] FDA and has been authorized for detection and/or diagnosis of SARS-CoV-2 by FDA under an Emergency Use Authorization (EUA).  This EUA will remain in effect (meaning this test can be used) for the duration of the COVID-19 declaration under Section 564(b)(1) of the Act, 21 U.S.C. section 360bbb-3(b)(1), unless the authorization is terminated or revoked sooner.  Performed at [facility], [place] [address], [place], [place] [address]   Surgical pcr screen     Status: None   Collection Time: 11/27/20  4:18 PM   Specimen: Nasal Mucosa; Nasal Swab  Result Value Ref Range Status   MRSA, PCR NEGATIVE NEGATIVE Final   Staphylococcus aureus NEGATIVE NEGATIVE Final    Comment: (NOTE) The Xpert SA Assay (FDA  approved for NASAL specimens in patients 102 years of age and older), is one component of a comprehensive surveillance program. It is not intended to diagnose infection nor to guide or monitor treatment. Performed at [facility], [place] [address], [place], [place] [address]   SARS CORONAVIRUS 2 (TAT 6-24 HRS) Nasopharyngeal Nasopharyngeal Swab     Status: None   Collection Time: 11/29/20  5:10 PM   Specimen: Nasopharyngeal Swab  Result Value Ref Range Status   SARS Coronavirus 2 NEGATIVE NEGATIVE Final    Comment: (NOTE) SARS-CoV-2 target nucleic acids are NOT DETECTED.  The SARS-CoV-2 RNA is generally detectable in upper and lower respiratory specimens during the acute phase of infection. Negative results do not preclude SARS-CoV-2 infection, do not rule out co-infections with other pathogens, and should not be used as the sole basis for treatment or other patient management decisions. Negative results must be combined with clinical observations, patient history, and epidemiological information. The expected result is Negative.  Fact Sheet for Patients: SugarRoll.be  Fact Sheet for Healthcare Providers: https://www.woods-mathews.com/  This test is not yet approved or cleared by the [place] FDA and  has been authorized for detection and/or diagnosis of SARS-CoV-2 by FDA under an Emergency Use Authorization (EUA). This EUA will remain  in effect (meaning this test can be used) for the duration of the COVID-19 declaration under Se ction 564(b)(1) of the Act, 21 U.S.C. section 360bbb-3(b)(1), unless the authorization is terminated or revoked sooner.  Performed at [facility], [place] [address] [place], [place] [address]      Labs: BNP (last 3 results) No results for input(s): BNP in the last 8760 hours. Basic Metabolic Panel: Recent Labs  Lab 11/24/20 1143 11/25/20 0334 11/26/20 0332 11/27/20 0327  11/28/20 0340 11/29/20 0338  NA 146* 139 134* 134* 132* 136  K 3.1* 3.4* 3.9 4.0 3.9 4.3  CL 118* 112* 109 106 102 105  CO2 22 20* 19* 21* 20* 22  GLUCOSE 100* 92 103* 97 98 132*  BUN 61* 41* 24* 21 17 20   CREATININE 1.53* 1.41* 1.38* 1.29* 1.49* 1.49*  CALCIUM 8.0* 7.6* 7.7* 7.9* 7.7* 8.1*  MG 1.9 1.7 1.7 2.0 1.8 2.0  PHOS 2.4* 2.3* 2.6 3.3 3.0  --    Liver Function Tests: Recent Labs  Lab 11/25/20 0334 11/26/20 0332 11/27/20 0327 11/28/20 0340 11/29/20 0338  AST 16 14* 16 15 16   ALT 13 12 14 12 12   ALKPHOS 50 54 60 64 68  BILITOT 0.9 1.0 0.9 0.8 0.7  PROT 5.2* 5.3* 5.6* 5.7* 6.4*  ALBUMIN 2.5* 2.5* 2.5* 2.6* 2.7*   Recent Labs  Lab 11/23/20 1859  LIPASE 28   No results for input(s): AMMONIA in the last 168 hours. CBC: Recent Labs  Lab 11/23/20 1859 11/24/20 1143 11/25/20 0334 11/25/20 1549 11/26/20 0332 11/27/20 0327 11/28/20 0340 11/29/20 0338  WBC 12.8*   < > 9.6  --  9.2 8.4 9.2 12.1*  NEUTROABS 9.6*  --  5.9  --  5.7 5.1 5.8  --   HGB 6.5*   < > 7.6* 7.2* 8.1* 7.6*  8.4* 8.4* 9.4*  HCT 20.9*   < > 24.1* 24.7* 24.8* 23.8*  26.1* 26.9* 29.7*  MCV 90.5   < > 92.3  --  90.8 91.3 93.4 92.2  PLT 274   < > 207  --  240 265 284 262   < > = values in this interval not displayed.   Cardiac Enzymes: No results for input(s): CKTOTAL, CKMB, CKMBINDEX, TROPONINI in the last 168 hours. BNP: Invalid input(s): POCBNP CBG: No results for input(s): GLUCAP in the last 168 hours. D-Dimer No results for input(s): DDIMER in the last 72 hours. Hgb A1c No results for input(s): HGBA1C in the last 72 hours. Lipid Profile No results for input(s): CHOL, HDL, LDLCALC, TRIG, CHOLHDL, LDLDIRECT in the last 72 hours. Thyroid function studies No results for input(s): TSH, T4TOTAL, T3FREE, THYROIDAB in the last 72 hours.  Invalid input(s): FREET3 Anemia work up No results for input(s): VITAMINB12, FOLATE, FERRITIN, TIBC, IRON, RETICCTPCT in the last 72 hours. Urinalysis     Component Value Date/Time   COLORURINE YELLOW 11/24/2020 0358   APPEARANCEUR CLEAR 11/24/2020 0358   LABSPEC 1.016 11/24/2020 0358   PHURINE 6.0 11/24/2020 0358   GLUCOSEU NEGATIVE 11/24/2020 0358   HGBUR NEGATIVE 11/24/2020 0358   BILIRUBINUR NEGATIVE 11/24/2020 0358   KETONESUR NEGATIVE 11/24/2020 0358   PROTEINUR NEGATIVE 11/24/2020 0358   UROBILINOGEN 0.2 04/21/2007 1451   NITRITE NEGATIVE 11/24/2020 0358   LEUKOCYTESUR NEGATIVE 11/24/2020 0358   Sepsis Labs Invalid input(s): PROCALCITONIN,  WBC,  LACTICIDVEN Microbiology Recent Results (from the past 240 hour(s))  SARS Coronavirus 2 by RT PCR (hospital order, performed in [place] hospital lab)     Status: None   Collection Time: 11/23/20  9:33 PM  Result Value Ref Range Status   SARS Coronavirus 2 NEGATIVE NEGATIVE Final    Comment: (NOTE) SARS-CoV-2 target nucleic acids are NOT DETECTED.  The SARS-CoV-2 RNA is generally detectable in upper and lower respiratory specimens during the acute phase of infection. The lowest concentration of SARS-CoV-2 viral copies this assay can detect is 250 copies / mL. A negative result does not preclude SARS-CoV-2 infection and should not be used as the sole basis for treatment or other patient management decisions.  A negative result may occur with improper specimen collection / handling, submission of specimen other than nasopharyngeal swab, presence of viral mutation(s) within the areas targeted by this assay, and inadequate number of viral copies (<250 copies / mL). A negative result must be combined with clinical observations, patient history, and epidemiological information.  Fact Sheet for Patients:   StrictlyIdeas.no  Fact Sheet for Healthcare Providers: BankingDealers.co.za  This test is not yet approved or  cleared by the [place] FDA and has been authorized for detection and/or diagnosis of SARS-CoV-2 by FDA under an  Emergency Use Authorization (EUA).  This EUA will remain in effect (meaning this test can be used) for the  duration of the COVID-19 declaration under Section 564(b)(1) of the Act, 21 U.S.C. section 360bbb-3(b)(1), unless the authorization is terminated or revoked sooner.  Performed at [facility], [place] [address], [place], [place] [address]   Surgical pcr screen     Status: None   Collection Time: 11/27/20  4:18 PM   Specimen: Nasal Mucosa; Nasal Swab  Result Value Ref Range Status   MRSA, PCR NEGATIVE NEGATIVE Final   Staphylococcus aureus NEGATIVE NEGATIVE Final    Comment: (NOTE) The Xpert SA Assay (FDA approved for NASAL specimens in patients 4 years of age and older), is one component of a comprehensive surveillance program. It is not intended to diagnose infection nor to guide or monitor treatment. Performed at [facility], [place] [address], [place], [place] [address]   SARS CORONAVIRUS 2 (TAT 6-24 HRS) Nasopharyngeal Nasopharyngeal Swab     Status: None   Collection Time: 11/29/20  5:10 PM   Specimen: Nasopharyngeal Swab  Result Value Ref Range Status   SARS Coronavirus 2 NEGATIVE NEGATIVE Final    Comment: (NOTE) SARS-CoV-2 target nucleic acids are NOT DETECTED.  The SARS-CoV-2 RNA is generally detectable in upper and lower respiratory specimens during the acute phase of infection. Negative results do not preclude SARS-CoV-2 infection, do not rule out co-infections with other pathogens, and should not be used as the sole basis for treatment or other patient management decisions. Negative results must be combined with clinical observations, patient history, and epidemiological information. The expected result is Negative.  Fact Sheet for Patients: SugarRoll.be  Fact Sheet for Healthcare Providers: https://www.woods-mathews.com/  This test is not yet approved or cleared by the [place] FDA and  has been authorized for detection and/or diagnosis of SARS-CoV-2 by FDA under an Emergency Use Authorization (EUA). This EUA will remain  in effect (meaning this test can be used) for the duration of the COVID-19 declaration under Se ction 564(b)(1) of the Act, 21 U.S.C. section 360bbb-3(b)(1), unless the authorization is terminated or revoked sooner.  Performed at [facility], [place] [address], [place],  [address]      Time coordinating discharge: Over 30 minutes  SIGNED:   Cydne Grahn J [place] DO  [facility] 11/30/2020, 9:33 AM

## 2020-11-30 NOTE — TOC Progression Note (Signed)
Transition of Care [facility]) - Progression Note    Patient Details  Name: [name] MRN: [id number] Date of Birth: Jan 13, 1925  Transition of [facility]) CM/SW Contact  Purcell Mouton, RN Phone Number: 11/30/2020, 10:14 AM  Clinical Narrative:     Spoke with pt and daughter concerning transfer back to [facility]. RN was also made aware. PTAR was called.   Expected Discharge Plan: [place] Barriers to Discharge: Continued Medical Work up  Expected Discharge Plan and Services Expected Discharge Plan: [place]   Discharge Planning Services: CM Consult   Living arrangements for the past 2 months: [place] Expected Discharge Date: 11/30/20                                     Social Determinants of Health (SDOH) Interventions    Readmission Risk Interventions No flowsheet data found.

## 2020-11-30 NOTE — NC FL2 (Signed)
[place] LEVEL OF CARE SCREENING TOOL     IDENTIFICATION  Patient Name: [name] Birthdate: 10-Mar-1925 Sex: male Admission Date (Current Location): 11/23/2020  [facility] and [place] Number:  Herbalist and Address:  [facility],  [place], [place]      Provider Number: [id number]  Attending Physician Name and Address:  [place] Eric J, DO  Relative Name and Phone Number:  Rudean Hitt Daughter [phone]  [phone]    Current Level of Care: Hospital Recommended Level of Care: [place] Prior Approval Number:    Date Approved/Denied:   PASRR Number: [id number]  Discharge Plan: SNF    Current Diagnoses: Patient Active Problem List   Diagnosis Date Noted  . HOH (hard of hearing) 11/25/2020  . Scoliosis of thoracolumbar region due to degenerative disease of spine in adult 11/25/2020  . Right groin mass c/w sebaceous cyst 11/25/2020  . Cameron ulcer, acute with bleeding & anemia 11/25/2020  . IDA (iron deficiency anemia) 11/25/2020  . Uses roller walker 11/25/2020  . HTN (hypertension) 11/24/2020  . Hypothyroidism 11/24/2020  . Cognitive impairment 11/24/2020  . CKD (chronic kidney disease), stage III ([place]) 02/03/2019  . H/O sinus bradycardia 11/20/2017  . Senile osteoporosis 11/20/2017  . Other idiopathic scoliosis, thoracolumbar region 11/20/2017  . Continuous leakage of urine 11/20/2017  . Protein-calorie malnutrition, moderate ([place]) 02/28/2014  . Chronic low back pain 02/26/2014  . Elevated LFTs 02/26/2014  . Alcohol use 02/26/2014  . Generalized weakness 02/26/2014  . GERD (gastroesophageal reflux disease) 08/13/2011  . Lumbar disc disease 08/13/2011    Orientation RESPIRATION BLADDER Height & Weight     Self,Time,Situation,Place  Normal Indwelling catheter Weight: 67.1 kg Height:  5\' 8"  (172.7 cm)  BEHAVIORAL SYMPTOMS/MOOD NEUROLOGICAL BOWEL NUTRITION STATUS      Continent  Diet (soft)  AMBULATORY STATUS COMMUNICATION OF NEEDS Skin   Extensive Assist Verbally Surgical wounds (Abdomen surgical wound, skin tear left arim, lateral and upper arm, gastrostomy tube)                       Personal Care Assistance Level of Assistance  Bathing,Feeding,Dressing Bathing Assistance: Limited assistance Feeding assistance: Independent Dressing Assistance: Limited assistance Total Care Assistance: Maximum assistance   Functional Limitations Info  Sight,Hearing,Speech Sight Info: Adequate Hearing Info: Impaired Speech Info: Adequate    SPECIAL CARE FACTORS FREQUENCY  PT (By licensed PT),OT (By licensed OT)     PT Frequency: x5 week OT Frequency: x5 week            Contractures Contractures Info: Not present    Additional Factors Info  Code Status,Allergies Code Status Info: DNR Allergies Info: Amrix (Cyclobenzaprine), Shellfish Allergy           Current Medications (11/30/2020):  This is the current hospital active medication list Current Facility-Administered Medications  Medication Dose Route Frequency Provider Last Rate Last Admin  . acetaminophen (TYLENOL) tablet 325-650 mg  325-650 mg Oral Q6H PRN Jill Alexanders, PA-C      . Chlorhexidine Gluconate Cloth 2 % PADS 6 each  6 each Topical Q0600 Jill Alexanders, PA-C   6 each at 11/30/20 [id number]  . feeding supplement (ENSURE ENLIVE / ENSURE PLUS) liquid 237 mL  237 mL Oral BID BM Jill Alexanders, PA-C   237 mL at 11/29/20 1612  . fentaNYL (SUBLIMAZE) injection 25-50 mcg  25-50 mcg Intravenous Q5 min PRN Ralene Ok, MD      .  levothyroxine (SYNTHROID) tablet 25 mcg  25 mcg Oral Q0600 Jill Alexanders, PA-C   25 mcg at 11/30/20 0533  . morphine 4 MG/ML injection 2 mg  2 mg Intravenous Q3H PRN Jill Alexanders, PA-C      . ondansetron [facility]) injection 4 mg  4 mg Intravenous Once PRN Ralene Ok, MD      . pantoprazole (PROTONIX) injection 40 mg  40 mg Intravenous Q12H  Jill Alexanders, PA-C   40 mg at 11/29/20 2122  . QUEtiapine (SEROQUEL) tablet 25 mg  25 mg Oral BID Jill Alexanders, PA-C   25 mg at 11/29/20 2133  . tamsulosin (FLOMAX) capsule 0.4 mg  0.4 mg Oral Daily Jill Alexanders, PA-C   0.4 mg at 11/29/20 0949  . traMADol (ULTRAM) tablet 50 mg  50 mg Oral Q6H PRN [place], Elizabeth S, PA-C      . vitamin B-12 (CYANOCOBALAMIN) tablet 500 mcg  500 mcg Oral Daily Jill Alexanders, PA-C   500 mcg at 11/29/20 2763     Discharge Medications: Please see discharge summary for a list of discharge medications.  Relevant Imaging Results:  Relevant Lab Results:   Additional Information RE#[id number]  Purcell Mouton, RN

## 2020-11-30 NOTE — Care Management Important Message (Signed)
Medicare IM printed to give to the patient, by Teagen Bucio 

## 2020-11-30 NOTE — Progress Notes (Signed)
Patient's IV was leaking. Nurse went to remove IV and some of the tape was so stuck to patient's skin that the removal of the tape caused a skin tear in the left lateral upper arm. Nurse cleansed skin tear area and applied a foam dressing.

## 2020-11-30 NOTE — Plan of Care (Signed)

## 2020-12-03 ENCOUNTER — Non-Acute Institutional Stay (SKILLED_NURSING_FACILITY): Payer: Medicare Other | Admitting: Internal Medicine

## 2020-12-03 ENCOUNTER — Encounter: Payer: Self-pay | Admitting: Internal Medicine

## 2020-12-03 DIAGNOSIS — D500 Iron deficiency anemia secondary to blood loss (chronic): Secondary | ICD-10-CM | POA: Diagnosis not present

## 2020-12-03 DIAGNOSIS — R531 Weakness: Secondary | ICD-10-CM

## 2020-12-03 DIAGNOSIS — R339 Retention of urine, unspecified: Secondary | ICD-10-CM | POA: Diagnosis not present

## 2020-12-03 DIAGNOSIS — R630 Anorexia: Secondary | ICD-10-CM

## 2020-12-03 DIAGNOSIS — N1832 Chronic kidney disease, stage 3b: Secondary | ICD-10-CM

## 2020-12-03 DIAGNOSIS — E039 Hypothyroidism, unspecified: Secondary | ICD-10-CM | POA: Diagnosis not present

## 2020-12-03 DIAGNOSIS — Z9889 Other specified postprocedural states: Secondary | ICD-10-CM

## 2020-12-03 DIAGNOSIS — R4189 Other symptoms and signs involving cognitive functions and awareness: Secondary | ICD-10-CM | POA: Diagnosis not present

## 2020-12-03 NOTE — Progress Notes (Signed)
Provider:  Veleta Miners MD Location:    [place] Room Number: 948 Place of Service:  SNF ([phone])  PCP: [name], MD Patient Care Team: [name], MD as PCP - General (Internal Medicine)  Extended Emergency Contact Information Primary Emergency Contact: Rudean Hitt Address: [address], [place] [address] [name] of [place] Phone: [phone] Mobile Phone: [phone] Relation: Daughter Secondary Emergency Contact: Simms,Carol Address: [address]          [place], [place] [address] [place] of [place] Phone: [phone] Work Phone: [phone] Relation: Other  Code Status: DNR Goals of Care: Advanced Directive information Advanced Directives 12/03/2020  Does Patient Have a Medical Advance Directive? Yes  Type of Advance Directive Out of facility DNR (pink MOST or yellow form)  Does patient want to make changes to medical advance directive? -  Copy of [place] in Chart? -  Would patient like information on creating a medical advance directive? -  Pre-existing out of facility DNR order (yellow form or pink MOST form) Pink MOST/Yellow Form most recent copy in chart - Physician notified to receive inpatient order      Chief Complaint  Patient presents with  . New Admit To SNF    Admission to SNF    HPI: Patient is a 87 y.o. male seen today for admission to SNF   Patient was admitted to Hospital From 4/8 -4/15 For GI bleed with Anemia with Large Hiatal Hernia s/p Gastric Reduction and Gastropexy Also had Urinary Retention now has Foley Cathter  Patient has a history of cognitive impairment, chronic back pain, hernia,  GERD,CKD stage 3   Was sent to the hospital after he had hematemesis from  SNF In ED was found to have a hemoglobin of 6.5. Underwent EGD which showed circumferential ulcer at GE junction.CT Scan showed Large Hiatal hernia with  complete  intrathoracic position of the stomach as well as portions of the pancreatic head and neck and multiple loops of nonobstructed small bowel.  General surgery was consulted and underwent Above Procedure on 04/13 Also Has Gastrostomy Tube Placement  In CT Scan he also was found to have Severe bilateral hydronephrosis and hydroureter to the ureterovesicular junctions. Now Has Foley Cathter placed  Patient is now in SNF for therapy and long-term care.  He did not have any acute complaints today except some pain to around the tube.   Per nursing staff his appetite seems poor.  He denies any nausea vomiting abdominal pain.  He said he is just not hungry. No other acute complaints.   Past Medical History:  Diagnosis Date  . Acute kidney injury ([place]) 03/06/14  . Anemia, unspecified 03/06/14  . Arthritis   . BPH (benign prostatic hyperplasia)   . Cerebral atrophy ([place]) 03/06/14  . Cerebrovascular disease 03/06/14  . Degenerative disc disease, lumbar 03/06/14  . Diverticula, bladder acquired   . Edema 03/06/14  . GERD (gastroesophageal reflux disease)   . H/O: GI bleed 1986  . Hiatal hernia   . History of fall 03/06/14  . Hypercalcemia 03/06/14  . Hyperlipidemia   . Hypertension   . Impotence   . LFT elevation 03/06/14  . Lumbago 03/06/14  . Mallory-Weiss tear 1986  . Osteoporosis   . Rhabdomyolysis 03/06/14  . Scoliosis of cervical spine 03/06/14  . Scoliosis of lumbar spine 03/06/14  . Shingles   .  Spermatocele    bilateral, left greater than right  . Troponin level elevated 03/06/14  . Weak 03/06/14   Past Surgical History:  Procedure Laterality Date  . BACK SURGERY  10/2009   ESI for surgery for lumbar spinal stenosis  . BIOPSY  11/24/2020   Procedure: BIOPSY;  Surgeon: Milus Banister, MD;  Location: [facility];  Service: Endoscopy;;  . clubbed toe repair  2004  . COLONOSCOPY  08/15/2011  . ESOPHAGOGASTRODUODENOSCOPY (EGD) WITH PROPOFOL N/A 11/24/2020   Procedure:  ESOPHAGOGASTRODUODENOSCOPY (EGD) WITH PROPOFOL;  Surgeon: Milus Banister, MD;  Location: [facility];  Service: Endoscopy;  Laterality: N/A;  . HIATAL HERNIA REPAIR N/A 11/28/2020   Procedure: Gastrostomy tube placement;  Surgeon: Ralene Ok, MD;  Location: [facility];  Service: General;  Laterality: N/A;  90 MINUTES  . INGUINAL HERNIA REPAIR Right 11/26/2004   Dr Rebekah Chesterfield - open w mesh  . REPLACEMENT TOTAL KNEE BILATERAL  2007, 2008  . TONSILLECTOMY AND ADENOIDECTOMY  1939  . X-STOP IMPLANTATION      reports that he has quit smoking. His smoking use included cigarettes. He has never used smokeless tobacco. He reports current alcohol use of about 14.0 standard drinks of alcohol per week. He reports that he does not use drugs. Social History   Socioeconomic History  . Marital status: Divorced    Spouse name: Not on file  . Number of children: 2  . Years of education: Not on file  . Highest education level: Not on file  Occupational History  . Occupation: retired  Tobacco Use  . Smoking status: Former Smoker    Types: Cigarettes  . Smokeless tobacco: Never Used  . Tobacco comment: quit in 1960s  Substance and Sexual Activity  . Alcohol use: Yes    Alcohol/week: 14.0 standard drinks    Types: 14 Standard drinks or equivalent per week    Comment: couple of drinks of scotch every other day  . Drug use: No  . Sexual activity: Not on file  Other Topics Concern  . Not on file  Social History Narrative   Tobacco use, amount per day now:  NONE   Past tobacco use, amount per day: 1 PACK PER DAY   How many years did you use tobacco: QUIT AGE 45/ 15 YEARS   Alcohol use (drinks per week): 3OZ DAILY/ 2 4OZ GLASSES OF WINE   Diet: REGULAR   Do you drink/eat things with caffeine:   Marital status: DIVORCED              What year were you married?   Do you live in a house, apartment, assisted living, condo, trailer, etc.? [place]    Is it one or more stories?  ONE STORY    How many persons live in your home? 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  WALK                                Type and how often?   Do you have a living will? YES   Do you have a DNR form?     NO  If not, do you want to discuss one?   Do you have signed POA/HPOA forms?   YES                     If so, please bring to you appointment   Social Determinants of Health   Financial Resource Strain: Not on file  Food Insecurity: Not on file  Transportation Needs: Not on file  Physical Activity: Not on file  Stress: Not on file  Social Connections: Not on file  Intimate Partner Violence: Not on file    Functional Status Survey:    Family History  Problem Relation Age of Onset  . Alcoholism Father   . Liver disease Father   . Colon cancer Neg Hx     Health Maintenance  Topic Date Due  . PNA vac Low Risk Adult (1 of 2 - PCV13) Never done  . COVID-19 Vaccine (3 - Booster) 03/26/2020  . DEXA SCAN  02/28/2021 (Originally 12/30/2014)  . TETANUS/TDAP  11/12/2027  . HPV VACCINES  Aged Out  . INFLUENZA VACCINE  Discontinued    Allergies  Allergen Reactions  . Amrix [Cyclobenzaprine]   . Shellfish Allergy     Allergies as of 12/03/2020      Reactions   Amrix [cyclobenzaprine]    Shellfish Allergy       Medication List       Accurate as of December 03, 2020 10:47 AM. If you have any questions, ask your nurse or doctor.        acetaminophen 325 MG tablet Commonly known as: TYLENOL Take 650 mg by mouth 2 (two) times daily.   acetaminophen 325 MG tablet Commonly known as: TYLENOL Take 650 mg by mouth 2 (two) times daily as needed.   amLODipine 2.5 MG tablet Commonly known as: NORVASC Take 1 tablet by mouth daily.   antiseptic oral rinse Liqd 15 mLs by Mouth Rinse route 4 (four) times daily as needed for dry  mouth.   bisacodyl 10 MG suppository Commonly known as: DULCOLAX Place 10 mg rectally as needed for moderate constipation.   lactose free nutrition Liqd Take 237 mLs by mouth 2 (two) times daily between meals.   levothyroxine 25 MCG tablet Commonly known as: SYNTHROID Take 1 tablet (25 mcg total) by mouth daily before breakfast.   Melatonin 5 MG Caps Take 1 capsule (5 mg total) by mouth at bedtime.   ondansetron 4 MG tablet Commonly known as: ZOFRAN Take 4 mg by mouth every 6 (six) hours as needed for nausea or vomiting.   pantoprazole 40 MG tablet Commonly known as: PROTONIX Take 1 tablet (40 mg total) by mouth 2 (two) times daily for 30 days, THEN 1 tablet (40 mg total) daily. Start taking on: November 30, 2020   QUEtiapine 25 MG tablet Commonly known as: SEROQUEL Take 25 mg by mouth 2 (two) times daily.   Systane 0.4-0.3 % Soln Generic drug: Polyethyl Glycol-Propyl Glycol Two drops both eyes three times daily as needed for dry itchy eyes.   tamsulosin 0.4 MG Caps capsule Commonly known as: FLOMAX Take 1 capsule (0.4 mg total) by mouth daily.   triamcinolone cream 0.1 % Commonly known as: KENALOG Apply 1 application topically as needed.   vitamin B-12 1000 MCG tablet Commonly known as: CYANOCOBALAMIN Take 500 mcg by mouth daily.       Review of Systems  Constitutional: Positive for activity change and appetite change.  HENT: Negative.   Respiratory: Negative.  Cardiovascular: Negative.   Gastrointestinal: Positive for constipation.  Genitourinary: Negative.   Musculoskeletal: Positive for gait problem.  Skin: Negative.   Neurological: Positive for weakness.  Psychiatric/Behavioral: Positive for confusion.    Vitals:   12/03/20 1029  BP: 114/73  Pulse: 63  Resp: 20  Temp: 98.4 F (36.9 C)  SpO2: 98%  Weight: 136 lb (61.7 kg)  Height: 5\' 4"  (1.626 m)   Body mass index is 23.34 kg/m. Physical Exam  Constitutional: . Well-developed but very  Frail HENT:  Head: Normocephalic.  Mouth/Throat: Oropharynx is clear and moist.  Eyes: Pupils are equal, round, and reactive to light.  Neck: Neck supple.  Cardiovascular: Normal rate and normal heart sounds.  No murmur heard. Pulmonary/Chest: Effort normal and breath sounds normal. No respiratory distress. No wheezes. She has no rales.  Abdominal: Soft. . No distension. There is no tenderness. There is no rebound. BS were decreased Musculoskeletal: No edema.  Lymphadenopathy: none Neurological: Alert Responds Appropriately No Focal Deficits Skin: Skin is warm and dry.  Psychiatric: Normal mood and affect. Behavior is normal. Thought content normal.   Labs reviewed: Basic Metabolic Panel: Recent Labs    11/26/20 0332 11/27/20 0327 11/28/20 0340 11/29/20 0338  NA 134* 134* 132* 136  K 3.9 4.0 3.9 4.3  CL 109 106 102 105  CO2 19* 21* 20* 22  GLUCOSE 103* 97 98 132*  BUN 24* 21 17 20   CREATININE 1.38* 1.29* 1.49* 1.49*  CALCIUM 7.7* 7.9* 7.7* 8.1*  MG 1.7 2.0 1.8 2.0  PHOS 2.6 3.3 3.0  --    Liver Function Tests: Recent Labs    11/27/20 0327 11/28/20 0340 11/29/20 0338  AST 16 15 16   ALT 14 12 12   ALKPHOS 60 64 68  BILITOT 0.9 0.8 0.7  PROT 5.6* 5.7* 6.4*  ALBUMIN 2.5* 2.6* 2.7*   Recent Labs    11/23/20 1859  LIPASE 28   No results for input(s): AMMONIA in the last 8760 hours. CBC: Recent Labs    11/26/20 0332 11/27/20 0327 11/28/20 0340 11/29/20 0338  WBC 9.2 8.4 9.2 12.1*  NEUTROABS 5.7 5.1 5.8  --   HGB 8.1* 7.6*  8.4* 8.4* 9.4*  HCT 24.8* 23.8*  26.1* 26.9* 29.7*  MCV 90.8 91.3 93.4 92.2  PLT 240 265 284 262   Cardiac Enzymes: No results for input(s): CKTOTAL, CKMB, CKMBINDEX, TROPONINI in the last 8760 hours. BNP: Invalid input(s): POCBNP Lab Results  Component Value Date   HGBA1C 5.6 11/26/2020   Lab Results  Component Value Date   TSH 3.249 11/26/2020   Lab Results  Component Value Date   VITAMINB12 1,579 05/13/2018   No  results found for: FOLATE No results found for: IRON, TIBC, FERRITIN  Imaging and Procedures obtained prior to SNF admission: CT CHEST W CONTRAST  Result Date: 11/24/2020 CLINICAL DATA:  Hematemesis, suspect large hiatal hernia EXAM: CT CHEST, ABDOMEN, AND PELVIS WITH CONTRAST TECHNIQUE: Multidetector CT imaging of the chest, abdomen and pelvis was performed following the standard protocol during bolus administration of intravenous contrast. CONTRAST:  48mL OMNIPAQUE IOHEXOL 300 MG/ML SOLN, additional oral enteric contrast COMPARISON:  None. FINDINGS: CT CHEST FINDINGS Cardiovascular: Aortic atherosclerosis. Normal heart size. Scattered coronary artery calcifications. No pericardial effusion. Mediastinum/Nodes: No enlarged mediastinal, hilar, or axillary lymph nodes. Large hiatal hernia with complete intrathoracic position of the stomach as well as portions of the pancreatic head and neck and multiple loops of nonobstructed small bowel. Thyroid gland, trachea, and esophagus demonstrate no  significant findings. Lungs/Pleura: Trace bilateral pleural effusions. Compressive atelectasis of the right lower lobe associated with a large hiatal hernia. Musculoskeletal: No chest wall mass or suspicious bone lesions identified. CT ABDOMEN PELVIS FINDINGS Hepatobiliary: No solid liver abnormality is seen. No gallstones, gallbladder wall thickening, or biliary dilatation. Pancreas: Unremarkable. No pancreatic ductal dilatation or surrounding inflammatory changes. Spleen: Normal in size without significant abnormality. Adrenals/Urinary Tract: Adrenal glands are unremarkable. Severe bilateral hydronephrosis and hydroureter to the ureterovesicular junctions. There is severe urinary bladder wall thickening and distention of the urinary bladder. Stomach/Bowel: Stomach is within normal limits. Appendix appears normal. No evidence of bowel wall thickening, distention, or inflammatory changes. Vascular/Lymphatic: Aortic  atherosclerosis. No enlarged abdominal or pelvic lymph nodes. Reproductive: TURP defect of the prostate. Other: There is a midline epigastric hernia containing multiple loops of nonobstructed small bowel (series 2, image 72). No abdominopelvic ascites. Musculoskeletal: No acute or significant osseous findings. IMPRESSION: 1. Large hiatal hernia with complete intrathoracic position of the stomach as well as portions of the pancreatic head and neck and multiple loops of nonobstructed small bowel. 2. Trace bilateral pleural effusions. Compressive atelectasis of the right lower lobe associated with a large hiatal hernia. 3. Severe bilateral hydronephrosis and hydroureter to the ureterovesicular junctions. There is severe urinary bladder wall thickening and distention of the urinary bladder. No obstructing calculi or other lesion identified. 4. TURP defect of the prostate. 5. There is a midline epigastric hernia containing multiple loops of nonobstructed small bowel. 6. Coronary artery disease. Aortic Atherosclerosis (ICD10-I70.0). Electronically Signed   By: Eddie Candle M.D.   On: 11/24/2020 23:27   CT ABDOMEN PELVIS W CONTRAST  Result Date: 11/24/2020 CLINICAL DATA:  Hematemesis, suspect large hiatal hernia EXAM: CT CHEST, ABDOMEN, AND PELVIS WITH CONTRAST TECHNIQUE: Multidetector CT imaging of the chest, abdomen and pelvis was performed following the standard protocol during bolus administration of intravenous contrast. CONTRAST:  79mL OMNIPAQUE IOHEXOL 300 MG/ML SOLN, additional oral enteric contrast COMPARISON:  None. FINDINGS: CT CHEST FINDINGS Cardiovascular: Aortic atherosclerosis. Normal heart size. Scattered coronary artery calcifications. No pericardial effusion. Mediastinum/Nodes: No enlarged mediastinal, hilar, or axillary lymph nodes. Large hiatal hernia with complete intrathoracic position of the stomach as well as portions of the pancreatic head and neck and multiple loops of nonobstructed small  bowel. Thyroid gland, trachea, and esophagus demonstrate no significant findings. Lungs/Pleura: Trace bilateral pleural effusions. Compressive atelectasis of the right lower lobe associated with a large hiatal hernia. Musculoskeletal: No chest wall mass or suspicious bone lesions identified. CT ABDOMEN PELVIS FINDINGS Hepatobiliary: No solid liver abnormality is seen. No gallstones, gallbladder wall thickening, or biliary dilatation. Pancreas: Unremarkable. No pancreatic ductal dilatation or surrounding inflammatory changes. Spleen: Normal in size without significant abnormality. Adrenals/Urinary Tract: Adrenal glands are unremarkable. Severe bilateral hydronephrosis and hydroureter to the ureterovesicular junctions. There is severe urinary bladder wall thickening and distention of the urinary bladder. Stomach/Bowel: Stomach is within normal limits. Appendix appears normal. No evidence of bowel wall thickening, distention, or inflammatory changes. Vascular/Lymphatic: Aortic atherosclerosis. No enlarged abdominal or pelvic lymph nodes. Reproductive: TURP defect of the prostate. Other: There is a midline epigastric hernia containing multiple loops of nonobstructed small bowel (series 2, image 72). No abdominopelvic ascites. Musculoskeletal: No acute or significant osseous findings. IMPRESSION: 1. Large hiatal hernia with complete intrathoracic position of the stomach as well as portions of the pancreatic head and neck and multiple loops of nonobstructed small bowel. 2. Trace bilateral pleural effusions. Compressive atelectasis of the right lower lobe associated  with a large hiatal hernia. 3. Severe bilateral hydronephrosis and hydroureter to the ureterovesicular junctions. There is severe urinary bladder wall thickening and distention of the urinary bladder. No obstructing calculi or other lesion identified. 4. TURP defect of the prostate. 5. There is a midline epigastric hernia containing multiple loops of  nonobstructed small bowel. 6. Coronary artery disease. Aortic Atherosclerosis (ICD10-I70.0). Electronically Signed   By: Eddie Candle M.D.   On: 11/24/2020 23:27    Assessment/Plan S/P gastric surgery Has Gastrostomy Tube Will Continue to just do water flushes Needs Follow up with Surgery Iron deficiency anemia due to chronic blood loss Hold iron right now Due to poor Appetite Repeat CBC Poor appetite Talking to [employer] Working on Supplaments Generalized weakness Therapy Urinary Retention Continue Foley cathter Follow up with Urology Started on Flomax in Hospital Acquired hypothyroidism TSH was normal 4/22 Stage 3b chronic kidney disease (HCC) Repeat BMP Cognitive impairment Change Seroquel to 12.5 mg BID AS looks Systems developer staff Communication:   Labs/tests ordered: CBC,CMP,on Thurs  Total time spent in this patient care encounter was 45 _  minutes; greater than 50% of the visit spent counseling patient and staff, reviewing records , Labs and coordinating care for problems addressed at this encounter.

## 2020-12-05 DIAGNOSIS — R278 Other lack of coordination: Secondary | ICD-10-CM | POA: Diagnosis not present

## 2020-12-05 DIAGNOSIS — Z903 Acquired absence of stomach [part of]: Secondary | ICD-10-CM | POA: Diagnosis not present

## 2020-12-05 DIAGNOSIS — R1084 Generalized abdominal pain: Secondary | ICD-10-CM | POA: Diagnosis not present

## 2020-12-05 DIAGNOSIS — R2681 Unsteadiness on feet: Secondary | ICD-10-CM | POA: Diagnosis not present

## 2020-12-05 DIAGNOSIS — R2689 Other abnormalities of gait and mobility: Secondary | ICD-10-CM | POA: Diagnosis not present

## 2020-12-06 ENCOUNTER — Encounter: Payer: Self-pay | Admitting: Adult Health

## 2020-12-06 ENCOUNTER — Non-Acute Institutional Stay (SKILLED_NURSING_FACILITY): Payer: Medicare Other | Admitting: Adult Health

## 2020-12-06 DIAGNOSIS — D500 Iron deficiency anemia secondary to blood loss (chronic): Secondary | ICD-10-CM

## 2020-12-06 DIAGNOSIS — K9429 Other complications of gastrostomy: Secondary | ICD-10-CM

## 2020-12-06 DIAGNOSIS — M25552 Pain in left hip: Secondary | ICD-10-CM

## 2020-12-06 DIAGNOSIS — D509 Iron deficiency anemia, unspecified: Secondary | ICD-10-CM | POA: Diagnosis not present

## 2020-12-06 DIAGNOSIS — R531 Weakness: Secondary | ICD-10-CM | POA: Diagnosis not present

## 2020-12-06 DIAGNOSIS — Z9889 Other specified postprocedural states: Secondary | ICD-10-CM | POA: Diagnosis not present

## 2020-12-06 DIAGNOSIS — R630 Anorexia: Secondary | ICD-10-CM | POA: Diagnosis not present

## 2020-12-06 DIAGNOSIS — M4125 Other idiopathic scoliosis, thoracolumbar region: Secondary | ICD-10-CM | POA: Diagnosis not present

## 2020-12-06 DIAGNOSIS — R278 Other lack of coordination: Secondary | ICD-10-CM | POA: Diagnosis not present

## 2020-12-06 DIAGNOSIS — R2681 Unsteadiness on feet: Secondary | ICD-10-CM | POA: Diagnosis not present

## 2020-12-06 DIAGNOSIS — R1084 Generalized abdominal pain: Secondary | ICD-10-CM | POA: Diagnosis not present

## 2020-12-06 DIAGNOSIS — K449 Diaphragmatic hernia without obstruction or gangrene: Secondary | ICD-10-CM | POA: Diagnosis not present

## 2020-12-06 DIAGNOSIS — R2689 Other abnormalities of gait and mobility: Secondary | ICD-10-CM | POA: Diagnosis not present

## 2020-12-06 DIAGNOSIS — K253 Acute gastric ulcer without hemorrhage or perforation: Secondary | ICD-10-CM | POA: Diagnosis not present

## 2020-12-06 DIAGNOSIS — Z903 Acquired absence of stomach [part of]: Secondary | ICD-10-CM | POA: Diagnosis not present

## 2020-12-06 LAB — BASIC METABOLIC PANEL
BUN: 21 (ref 4–21)
CO2: 26 — AB (ref 13–22)
Chloride: 100 (ref 99–108)
Creatinine: 1.2 (ref 0.6–1.3)
Glucose: 96
Potassium: 3.6 (ref 3.4–5.3)
Sodium: 134 — AB (ref 137–147)

## 2020-12-06 LAB — COMPREHENSIVE METABOLIC PANEL
Albumin: 2.8 — AB (ref 3.5–5.0)
Calcium: 7.8 — AB (ref 8.7–10.7)
Globulin: 2.6

## 2020-12-06 LAB — CBC AND DIFFERENTIAL
HCT: 24 — AB (ref 41–53)
Hemoglobin: 7.9 — AB (ref 13.5–17.5)
Platelets: 463 — AB (ref 150–399)
WBC: 12.2

## 2020-12-06 LAB — HEPATIC FUNCTION PANEL
ALT: 11 (ref 10–40)
AST: 15 (ref 14–40)
Alkaline Phosphatase: 86 (ref 25–125)
Bilirubin, Total: 0.4

## 2020-12-06 LAB — CBC: RBC: 2.72 — AB (ref 3.87–5.11)

## 2020-12-06 NOTE — Progress Notes (Signed)
Location:  [facility] of Service:  SNF (31) Provider:   Cindi Carbon, [place] [phone]   [name], MD  Patient Care Team: [name], MD as PCP - General (Internal Medicine)  Extended Emergency Contact Information Primary Emergency Contact: Rudean Hitt Address: [address], [place] [address] [name] Phone: [phone] Mobile Phone: [phone] Relation: Daughter Secondary Emergency Contact: Simms,Carol Address: [address]          [place], [place] [address] [place] Phone: [phone] Work Phone: [phone] Relation: Other  Code Status:  DNR Goals of care: Advanced Directive information Advanced Directives 12/03/2020  Does Patient Have a Medical Advance Directive? Yes  Type of Advance Directive Out of facility DNR (pink MOST or yellow form)  Does patient want to make changes to medical advance directive? -  Copy of [place] in Chart? -  Would patient like information on creating a medical advance directive? -  Pre-existing out of facility DNR order (yellow form or pink MOST form) Pink MOST/Yellow Form most recent copy in chart - Physician notified to receive inpatient order     Chief Complaint  Patient presents with  . Acute Visit    HPI:  Pt is a 87 y.o. male seen today for an acute visit for left hip pain and f/u s/p hospitalization for acute GIB with cameron ulcer and large hiatal hernia.  He is s/p  Gastric reduction and gastropexy by Dr. Rosendo Gros on 11/28/20. He also has a foley cath in place due to urinary retention and bilateral hydronephrosis. During his hospitalization he received 2 units of PRBCs Hgb on discharge was 9.4 WBC 12.1  F/U labs 12/06/2020 Hgb 7.9 MCV 86.7 WBC 12.2.  He has not had any episodes of bleeding, low bp, or abd pain. He did have a loose stool last evening. His intake is variable  25-50%.  Adequate urine out put and no fever.  He reports his left hip hurts and is worse with ambulation. He states he has had this before due to his hx of scoliosis but he is a poor historian due to a hx of dementia. He remains alert and pleasant and able to f/c.  He has a G tube that is not being used but is flushed regularly. The area had some leaked of gastric juices which led to erythema of the site. The tube flushes fine per staff. He is not having any nausea or vomiting. He is able to ambulate with assistance and a walker.  CMP reviewed 12/06/2020 Glucose 96  BUN 20.9 CR 1.19 K 3.6 NA 134 Cl 100 C02 26  Past Medical History:  Diagnosis Date  . Acute kidney injury ([place]) 03/06/14  . Anemia, unspecified 03/06/14  . Arthritis   . BPH (benign prostatic hyperplasia)   . Cerebral atrophy ([place]) 03/06/14  . Cerebrovascular disease 03/06/14  . Degenerative disc disease, lumbar 03/06/14  . Diverticula, bladder acquired   . Edema 03/06/14  . GERD (gastroesophageal reflux disease)   . H/O: GI bleed 1986  . Hiatal hernia   . History of fall 03/06/14  . Hypercalcemia 03/06/14  . Hyperlipidemia   . Hypertension   . Impotence   . LFT elevation 03/06/14  . Lumbago 03/06/14  . Mallory-Weiss tear 1986  . Osteoporosis   . Rhabdomyolysis 03/06/14  . Scoliosis of cervical spine  03/06/14  . Scoliosis of lumbar spine 03/06/14  . Shingles   . Spermatocele    bilateral, left greater than right  . Troponin level elevated 03/06/14  . Weak 03/06/14   Past Surgical History:  Procedure Laterality Date  . BACK SURGERY  10/2009   ESI for surgery for lumbar spinal stenosis  . BIOPSY  11/24/2020   Procedure: BIOPSY;  Surgeon: Milus Banister, MD;  Location: [facility];  Service: Endoscopy;;  . clubbed toe repair  2004  . COLONOSCOPY  08/15/2011  . ESOPHAGOGASTRODUODENOSCOPY (EGD) WITH PROPOFOL N/A 11/24/2020   Procedure: ESOPHAGOGASTRODUODENOSCOPY (EGD) WITH PROPOFOL;  Surgeon: Milus Banister, MD;  Location: [facility]  ENDOSCOPY;  Service: Endoscopy;  Laterality: N/A;  . HIATAL HERNIA REPAIR N/A 11/28/2020   Procedure: Gastrostomy tube placement;  Surgeon: Ralene Ok, MD;  Location: [facility] ORS;  Service: General;  Laterality: N/A;  90 MINUTES  . INGUINAL HERNIA REPAIR Right 11/26/2004   Dr Rebekah Chesterfield - open w mesh  . REPLACEMENT TOTAL KNEE BILATERAL  2007, 2008  . TONSILLECTOMY AND ADENOIDECTOMY  1939  . X-STOP IMPLANTATION      Allergies  Allergen Reactions  . Amrix [Cyclobenzaprine]   . Shellfish Allergy     Outpatient Encounter Medications as of 12/06/2020  Medication Sig  . acetaminophen (TYLENOL) 325 MG tablet Take 650 mg by mouth 2 (two) times daily.   Marland Kitchen acetaminophen (TYLENOL) 325 MG tablet Take 650 mg by mouth 2 (two) times daily as needed.  Marland Kitchen amLODipine (NORVASC) 2.5 MG tablet Take 1 tablet by mouth daily.  Marland Kitchen antiseptic oral rinse (BIOTENE) LIQD 15 mLs by Mouth Rinse route 4 (four) times daily as needed for dry mouth.  . bisacodyl (DULCOLAX) 10 MG suppository Place 10 mg rectally as needed for moderate constipation.  . lactose free nutrition (BOOST) LIQD Take 237 mLs by mouth 2 (two) times daily between meals.  Marland Kitchen levothyroxine (SYNTHROID) 25 MCG tablet Take 1 tablet (25 mcg total) by mouth daily before breakfast.  . Melatonin 5 MG CAPS Take 1 capsule (5 mg total) by mouth at bedtime.  . ondansetron (ZOFRAN) 4 MG tablet Take 4 mg by mouth every 6 (six) hours as needed for nausea or vomiting.  . pantoprazole (PROTONIX) 40 MG tablet Take 1 tablet (40 mg total) by mouth 2 (two) times daily for 30 days, THEN 1 tablet (40 mg total) daily.  Vladimir Faster Glycol-Propyl Glycol (SYSTANE) 0.4-0.3 % SOLN Two drops both eyes three times daily as needed for dry itchy eyes.  Marland Kitchen QUEtiapine (SEROQUEL) 25 MG tablet Take 25 mg by mouth 2 (two) times daily.   . tamsulosin (FLOMAX) 0.4 MG CAPS capsule Take 1 capsule (0.4 mg total) by mouth daily.  Marland Kitchen triamcinolone cream (KENALOG) 0.1 % Apply 1 application topically as  needed.  . vitamin B-12 (CYANOCOBALAMIN) 1000 MCG tablet Take 500 mcg by mouth daily.    No facility-administered encounter medications on file as of 12/06/2020.    Review of Systems  Constitutional: Positive for appetite change. Negative for activity change, chills, diaphoresis, fatigue, fever and unexpected weight change.  HENT: Negative for congestion and trouble swallowing.   Respiratory: Negative for cough, shortness of breath, wheezing and stridor.   Cardiovascular: Positive for leg swelling. Negative for chest pain and palpitations.  Gastrointestinal: Negative for abdominal distention, abdominal pain, anal bleeding, blood in stool, constipation, diarrhea, nausea, rectal pain and vomiting.  Genitourinary: Negative for decreased urine volume, difficulty urinating, dysuria, hematuria, penile discharge and penile swelling.  Musculoskeletal:  Positive for arthralgias, back pain and gait problem. Negative for joint swelling and myalgias.  Skin: Positive for rash (at gtube site).  Neurological: Negative for dizziness, seizures, syncope, facial asymmetry, speech difficulty, weakness and headaches.  Hematological: Negative for adenopathy. Does not bruise/bleed easily.  Psychiatric/Behavioral: Positive for confusion (memory loss). Negative for agitation and behavioral problems.    Immunization History  Administered Date(s) Administered  . Influenza, High Dose Seasonal PF 06/08/2020  . Moderna Sars-Covid-2 Vaccination 09/09/2019, 09/27/2019  . Td 09/02/2007  . Tdap 11/11/2017   Pertinent  Health Maintenance Due  Topic Date Due  . PNA vac Low Risk Adult (1 of 2 - PCV13) Never done  . DEXA SCAN  02/28/2021 (Originally 12/30/2014)  . INFLUENZA VACCINE  Discontinued   Fall Risk  02/29/2020 10/19/2019 06/13/2019 03/16/2019 05/12/2018  Falls in the past year? 0 0 0 0 No  Number falls in past yr: 0 0 0 0 -  Injury with Fall? 0 0 0 0 -  Risk for fall due to : - - Impaired balance/gait - -  Follow  up - - Falls evaluation completed - -   Functional Status Survey:    Vitals:   12/06/20 1037  BP: 126/64  Pulse: 76  Resp: 18  Temp: 97.8 F (36.6 C)  SpO2: 99%   There is no height or weight on file to calculate BMI. Physical Exam Vitals and nursing note reviewed.  Constitutional:      General: He is not in acute distress.    Appearance: He is not diaphoretic.  HENT:     Head: Normocephalic and atraumatic.  Neck:     Thyroid: No thyromegaly.     Vascular: No JVD.     Trachea: No tracheal deviation.  Cardiovascular:     Rate and Rhythm: Normal rate and regular rhythm.     Heart sounds: No murmur heard.   Pulmonary:     Effort: Pulmonary effort is normal. No respiratory distress.     Breath sounds: Normal breath sounds. No wheezing.  Abdominal:     General: Bowel sounds are normal. There is no distension.     Palpations: Abdomen is soft.     Tenderness: There is no abdominal tenderness. There is no right CVA tenderness, left CVA tenderness or guarding.     Comments: g tube site sutures in place with mild surrounding erythema. No purulent drainage or swelling.   Genitourinary:    Comments: Foley cath with clear yellow urine Musculoskeletal:        General: Deformity (soliosis and left rib protrusion due to this. ) present.     Right hip: Normal.     Left hip: No deformity, lacerations, tenderness, bony tenderness or crepitus. Normal range of motion. Normal strength.     Right lower leg: Edema (+1) present.     Left lower leg: Edema (+1) present.  Lymphadenopathy:     Cervical: No cervical adenopathy.  Skin:    General: Skin is warm and dry.  Neurological:     Mental Status: He is alert and oriented to person, place, and time.  Psychiatric:        Mood and Affect: Mood normal.     Labs reviewed: Recent Labs    11/26/20 0332 11/27/20 0327 11/28/20 0340 11/29/20 0338  NA 134* 134* 132* 136  K 3.9 4.0 3.9 4.3  CL 109 106 102 105  CO2 19* 21* 20* 22   GLUCOSE 103* 97 98 132*  BUN 24*  21 17 20   CREATININE 1.38* 1.29* 1.49* 1.49*  CALCIUM 7.7* 7.9* 7.7* 8.1*  MG 1.7 2.0 1.8 2.0  PHOS 2.6 3.3 3.0  --    Recent Labs    11/27/20 0327 11/28/20 0340 11/29/20 0338  AST 16 15 16   ALT 14 12 12   ALKPHOS 60 64 68  BILITOT 0.9 0.8 0.7  PROT 5.6* 5.7* 6.4*  ALBUMIN 2.5* 2.6* 2.7*   Recent Labs    11/26/20 0332 11/27/20 0327 11/28/20 0340 11/29/20 0338  WBC 9.2 8.4 9.2 12.1*  NEUTROABS 5.7 5.1 5.8  --   HGB 8.1* 7.6*  8.4* 8.4* 9.4*  HCT 24.8* 23.8*  26.1* 26.9* 29.7*  MCV 90.8 91.3 93.4 92.2  PLT 240 265 284 262   Lab Results  Component Value Date   TSH 3.249 11/26/2020   Lab Results  Component Value Date   HGBA1C 5.6 11/26/2020   No results found for: CHOL, HDL, LDLCALC, LDLDIRECT, TRIG, CHOLHDL  Significant Diagnostic Results in last 30 days:  CT CHEST W CONTRAST  Result Date: 11/24/2020 CLINICAL DATA:  Hematemesis, suspect large hiatal hernia EXAM: CT CHEST, ABDOMEN, AND PELVIS WITH CONTRAST TECHNIQUE: Multidetector CT imaging of the chest, abdomen and pelvis was performed following the standard protocol during bolus administration of intravenous contrast. CONTRAST:  1mL OMNIPAQUE IOHEXOL 300 MG/ML SOLN, additional oral enteric contrast COMPARISON:  None. FINDINGS: CT CHEST FINDINGS Cardiovascular: Aortic atherosclerosis. Normal heart size. Scattered coronary artery calcifications. No pericardial effusion. Mediastinum/Nodes: No enlarged mediastinal, hilar, or axillary lymph nodes. Large hiatal hernia with complete intrathoracic position of the stomach as well as portions of the pancreatic head and neck and multiple loops of nonobstructed small bowel. Thyroid gland, trachea, and esophagus demonstrate no significant findings. Lungs/Pleura: Trace bilateral pleural effusions. Compressive atelectasis of the right lower lobe associated with a large hiatal hernia. Musculoskeletal: No chest wall mass or suspicious bone lesions  identified. CT ABDOMEN PELVIS FINDINGS Hepatobiliary: No solid liver abnormality is seen. No gallstones, gallbladder wall thickening, or biliary dilatation. Pancreas: Unremarkable. No pancreatic ductal dilatation or surrounding inflammatory changes. Spleen: Normal in size without significant abnormality. Adrenals/Urinary Tract: Adrenal glands are unremarkable. Severe bilateral hydronephrosis and hydroureter to the ureterovesicular junctions. There is severe urinary bladder wall thickening and distention of the urinary bladder. Stomach/Bowel: Stomach is within normal limits. Appendix appears normal. No evidence of bowel wall thickening, distention, or inflammatory changes. Vascular/Lymphatic: Aortic atherosclerosis. No enlarged abdominal or pelvic lymph nodes. Reproductive: TURP defect of the prostate. Other: There is a midline epigastric hernia containing multiple loops of nonobstructed small bowel (series 2, image 72). No abdominopelvic ascites. Musculoskeletal: No acute or significant osseous findings. IMPRESSION: 1. Large hiatal hernia with complete intrathoracic position of the stomach as well as portions of the pancreatic head and neck and multiple loops of nonobstructed small bowel. 2. Trace bilateral pleural effusions. Compressive atelectasis of the right lower lobe associated with a large hiatal hernia. 3. Severe bilateral hydronephrosis and hydroureter to the ureterovesicular junctions. There is severe urinary bladder wall thickening and distention of the urinary bladder. No obstructing calculi or other lesion identified. 4. TURP defect of the prostate. 5. There is a midline epigastric hernia containing multiple loops of nonobstructed small bowel. 6. Coronary artery disease. Aortic Atherosclerosis (ICD10-I70.0). Electronically Signed   By: Eddie Candle M.D.   On: 11/24/2020 23:27   CT ABDOMEN PELVIS W CONTRAST  Result Date: 11/24/2020 CLINICAL DATA:  Hematemesis, suspect large hiatal hernia EXAM: CT  CHEST, ABDOMEN, AND PELVIS WITH  CONTRAST TECHNIQUE: Multidetector CT imaging of the chest, abdomen and pelvis was performed following the standard protocol during bolus administration of intravenous contrast. CONTRAST:  71mL OMNIPAQUE IOHEXOL 300 MG/ML SOLN, additional oral enteric contrast COMPARISON:  None. FINDINGS: CT CHEST FINDINGS Cardiovascular: Aortic atherosclerosis. Normal heart size. Scattered coronary artery calcifications. No pericardial effusion. Mediastinum/Nodes: No enlarged mediastinal, hilar, or axillary lymph nodes. Large hiatal hernia with complete intrathoracic position of the stomach as well as portions of the pancreatic head and neck and multiple loops of nonobstructed small bowel. Thyroid gland, trachea, and esophagus demonstrate no significant findings. Lungs/Pleura: Trace bilateral pleural effusions. Compressive atelectasis of the right lower lobe associated with a large hiatal hernia. Musculoskeletal: No chest wall mass or suspicious bone lesions identified. CT ABDOMEN PELVIS FINDINGS Hepatobiliary: No solid liver abnormality is seen. No gallstones, gallbladder wall thickening, or biliary dilatation. Pancreas: Unremarkable. No pancreatic ductal dilatation or surrounding inflammatory changes. Spleen: Normal in size without significant abnormality. Adrenals/Urinary Tract: Adrenal glands are unremarkable. Severe bilateral hydronephrosis and hydroureter to the ureterovesicular junctions. There is severe urinary bladder wall thickening and distention of the urinary bladder. Stomach/Bowel: Stomach is within normal limits. Appendix appears normal. No evidence of bowel wall thickening, distention, or inflammatory changes. Vascular/Lymphatic: Aortic atherosclerosis. No enlarged abdominal or pelvic lymph nodes. Reproductive: TURP defect of the prostate. Other: There is a midline epigastric hernia containing multiple loops of nonobstructed small bowel (series 2, image 72). No abdominopelvic ascites.  Musculoskeletal: No acute or significant osseous findings. IMPRESSION: 1. Large hiatal hernia with complete intrathoracic position of the stomach as well as portions of the pancreatic head and neck and multiple loops of nonobstructed small bowel. 2. Trace bilateral pleural effusions. Compressive atelectasis of the right lower lobe associated with a large hiatal hernia. 3. Severe bilateral hydronephrosis and hydroureter to the ureterovesicular junctions. There is severe urinary bladder wall thickening and distention of the urinary bladder. No obstructing calculi or other lesion identified. 4. TURP defect of the prostate. 5. There is a midline epigastric hernia containing multiple loops of nonobstructed small bowel. 6. Coronary artery disease. Aortic Atherosclerosis (ICD10-I70.0). Electronically Signed   By: Eddie Candle M.D.   On: 11/24/2020 23:27    Assessment/Plan  1. Left hip pain Has severe scoliosis with deformity and pain at the iliac crest area and very limited space between the iliac crest and rib cage.  No acute injury Increase Tylenol 1 gram tid, if no improvement consider Ultram If not improving report to psc  2. Cameron ulcer, acute with bleeding & anemia No current symptoms Continue Protonix as prescribed   3. Hiatal hernia Led to #4  4. S/P gastric surgery F/U with surgery No issues with pain IS 10 breaths qid x 1 week Continue boost Weight weekly   5. Iron deficiency anemia due to chronic blood loss Hgb trending down with no acute bleeding or change in BP Recheck CBC with iron panel on 4/25  6. Gastrostomy tube skin breakdown ([place]) Had skin irritation due to leaked (if this re occurs will check with specialist) Bactroban bid x 7 days to tube site for a barrier  7. Other idiopathic scoliosis, thoracolumbar region Increased tylenol as above Needs walker at all times.   8. Poor appetite Encourage intake, boost, and monitor weight Has g tube if needed   9.  Weakness Continue PT and OT  Labs/tests ordered:  CBC BMP iron panel Monday 4/25

## 2020-12-07 DIAGNOSIS — R2689 Other abnormalities of gait and mobility: Secondary | ICD-10-CM | POA: Diagnosis not present

## 2020-12-07 DIAGNOSIS — R2681 Unsteadiness on feet: Secondary | ICD-10-CM | POA: Diagnosis not present

## 2020-12-07 DIAGNOSIS — R1084 Generalized abdominal pain: Secondary | ICD-10-CM | POA: Diagnosis not present

## 2020-12-07 DIAGNOSIS — Z903 Acquired absence of stomach [part of]: Secondary | ICD-10-CM | POA: Diagnosis not present

## 2020-12-07 DIAGNOSIS — R278 Other lack of coordination: Secondary | ICD-10-CM | POA: Diagnosis not present

## 2020-12-07 DIAGNOSIS — M6389 Disorders of muscle in diseases classified elsewhere, multiple sites: Secondary | ICD-10-CM | POA: Diagnosis not present

## 2020-12-09 DIAGNOSIS — R278 Other lack of coordination: Secondary | ICD-10-CM | POA: Diagnosis not present

## 2020-12-09 DIAGNOSIS — R1084 Generalized abdominal pain: Secondary | ICD-10-CM | POA: Diagnosis not present

## 2020-12-09 DIAGNOSIS — Z903 Acquired absence of stomach [part of]: Secondary | ICD-10-CM | POA: Diagnosis not present

## 2020-12-09 DIAGNOSIS — R2681 Unsteadiness on feet: Secondary | ICD-10-CM | POA: Diagnosis not present

## 2020-12-09 DIAGNOSIS — R2689 Other abnormalities of gait and mobility: Secondary | ICD-10-CM | POA: Diagnosis not present

## 2020-12-10 DIAGNOSIS — D500 Iron deficiency anemia secondary to blood loss (chronic): Secondary | ICD-10-CM | POA: Diagnosis not present

## 2020-12-10 DIAGNOSIS — D631 Anemia in chronic kidney disease: Secondary | ICD-10-CM | POA: Diagnosis not present

## 2020-12-10 DIAGNOSIS — D508 Other iron deficiency anemias: Secondary | ICD-10-CM | POA: Diagnosis not present

## 2020-12-10 DIAGNOSIS — E611 Iron deficiency: Secondary | ICD-10-CM | POA: Diagnosis not present

## 2020-12-10 LAB — IRON,TIBC AND FERRITIN PANEL
Iron: 16
TIBC: 227
UIBC: 211

## 2020-12-10 LAB — HEPATIC FUNCTION PANEL
ALT: 15 (ref 10–40)
AST: 17 (ref 14–40)
Alkaline Phosphatase: 97 (ref 25–125)
Bilirubin, Total: 0.2

## 2020-12-10 LAB — CBC: RBC: 3.02 — AB (ref 3.87–5.11)

## 2020-12-10 LAB — CBC AND DIFFERENTIAL
HCT: 26 — AB (ref 41–53)
Hemoglobin: 8.5 — AB (ref 13.5–17.5)
Platelets: 588 — AB (ref 150–399)
WBC: 8.9

## 2020-12-10 LAB — COMPREHENSIVE METABOLIC PANEL
Albumin: 3 — AB (ref 3.5–5.0)
Calcium: 8.2 — AB (ref 8.7–10.7)
Globulin: 3

## 2020-12-10 LAB — BASIC METABOLIC PANEL
BUN: 19 (ref 4–21)
CO2: 23 — AB (ref 13–22)
Chloride: 97 — AB (ref 99–108)
Creatinine: 1.3 (ref 0.6–1.3)
Glucose: 95
Potassium: 3.8 (ref 3.4–5.3)
Sodium: 133 — AB (ref 137–147)

## 2020-12-11 DIAGNOSIS — M6389 Disorders of muscle in diseases classified elsewhere, multiple sites: Secondary | ICD-10-CM | POA: Diagnosis not present

## 2020-12-11 DIAGNOSIS — R2689 Other abnormalities of gait and mobility: Secondary | ICD-10-CM | POA: Diagnosis not present

## 2020-12-11 DIAGNOSIS — R1084 Generalized abdominal pain: Secondary | ICD-10-CM | POA: Diagnosis not present

## 2020-12-11 DIAGNOSIS — R2681 Unsteadiness on feet: Secondary | ICD-10-CM | POA: Diagnosis not present

## 2020-12-11 DIAGNOSIS — R278 Other lack of coordination: Secondary | ICD-10-CM | POA: Diagnosis not present

## 2020-12-11 DIAGNOSIS — Z903 Acquired absence of stomach [part of]: Secondary | ICD-10-CM | POA: Diagnosis not present

## 2020-12-12 DIAGNOSIS — R1084 Generalized abdominal pain: Secondary | ICD-10-CM | POA: Diagnosis not present

## 2020-12-12 DIAGNOSIS — Z903 Acquired absence of stomach [part of]: Secondary | ICD-10-CM | POA: Diagnosis not present

## 2020-12-12 DIAGNOSIS — R2681 Unsteadiness on feet: Secondary | ICD-10-CM | POA: Diagnosis not present

## 2020-12-12 DIAGNOSIS — R278 Other lack of coordination: Secondary | ICD-10-CM | POA: Diagnosis not present

## 2020-12-12 DIAGNOSIS — M6389 Disorders of muscle in diseases classified elsewhere, multiple sites: Secondary | ICD-10-CM | POA: Diagnosis not present

## 2020-12-12 DIAGNOSIS — R2689 Other abnormalities of gait and mobility: Secondary | ICD-10-CM | POA: Diagnosis not present

## 2020-12-12 LAB — TRANSFERRIN SATURATION: TRANSFERRIN SATURATION: 6.88

## 2020-12-13 ENCOUNTER — Non-Acute Institutional Stay (SKILLED_NURSING_FACILITY): Payer: Medicare Other | Admitting: Adult Health

## 2020-12-13 DIAGNOSIS — K253 Acute gastric ulcer without hemorrhage or perforation: Secondary | ICD-10-CM

## 2020-12-13 DIAGNOSIS — Z9889 Other specified postprocedural states: Secondary | ICD-10-CM

## 2020-12-13 DIAGNOSIS — D500 Iron deficiency anemia secondary to blood loss (chronic): Secondary | ICD-10-CM

## 2020-12-13 DIAGNOSIS — K449 Diaphragmatic hernia without obstruction or gangrene: Secondary | ICD-10-CM

## 2020-12-13 DIAGNOSIS — R1084 Generalized abdominal pain: Secondary | ICD-10-CM | POA: Diagnosis not present

## 2020-12-13 DIAGNOSIS — M6389 Disorders of muscle in diseases classified elsewhere, multiple sites: Secondary | ICD-10-CM | POA: Diagnosis not present

## 2020-12-13 DIAGNOSIS — E440 Moderate protein-calorie malnutrition: Secondary | ICD-10-CM | POA: Diagnosis not present

## 2020-12-13 DIAGNOSIS — Z903 Acquired absence of stomach [part of]: Secondary | ICD-10-CM | POA: Diagnosis not present

## 2020-12-13 DIAGNOSIS — M25552 Pain in left hip: Secondary | ICD-10-CM

## 2020-12-13 DIAGNOSIS — R278 Other lack of coordination: Secondary | ICD-10-CM | POA: Diagnosis not present

## 2020-12-13 DIAGNOSIS — I10 Essential (primary) hypertension: Secondary | ICD-10-CM | POA: Diagnosis not present

## 2020-12-13 DIAGNOSIS — N1832 Chronic kidney disease, stage 3b: Secondary | ICD-10-CM

## 2020-12-14 ENCOUNTER — Encounter: Payer: Self-pay | Admitting: Adult Health

## 2020-12-14 NOTE — Progress Notes (Signed)
Location:  [facility] of Service:  SNF (31) Provider:   Cindi Carbon, [place] [phone]   [name], MD  Patient Care Team: [name], MD as PCP - General (Internal Medicine)  Extended Emergency Contact Information Primary Emergency Contact: Rudean Hitt Address: [place], [place] [address] [name] of [place] Phone: [phone] Mobile Phone: [phone] Relation: Daughter Secondary Emergency Contact: Simms,Carol Address: [address]          [place], [place] [address] [place] Phone: [phone] Work Phone: [phone] Relation: Other  Code Status:  DNR Goals of care: Advanced Directive information Advanced Directives 12/03/2020  Does Patient Have a Medical Advance Directive? Yes  Type of Advance Directive Out of facility DNR (pink MOST or yellow form)  Does patient want to make changes to medical advance directive? -  Copy of [place] in Chart? -  Would patient like information on creating a medical advance directive? -  Pre-existing out of facility DNR order (yellow form or pink MOST form) Pink MOST/Yellow Form most recent copy in chart - Physician notified to receive inpatient order     Chief Complaint  Patient presents with  . Acute Visit    F/u GIB, gastroplexy, anemia, htn     HPI:  Pt is a 87 y.o. male seen today for  f/u s/p hospitalization for acute GIB with cameron ulcer and large hiatal hernia.  He is s/p  Gastric reduction and gastropexy by Dr. Rosendo Gros on 11/28/20. He also has a foley cath in place due to urinary retention and bilateral hydronephrosis. During his hospitalization he received 2 units of PRBCs Hgb on discharge was 9.4.  Rechecks at [facility] as below  4/21 Hgb 7.9 <<<4/25 Hgb 8.5  Had f/u with surgery 4/25 and they tightened his G tube due to leakage.   He is taking boost bid Alb 3.0 4/25  increased from 2.8  Starting to eat more solid bland food. Has 1-3 loose stools per day. No abd pain or bleeding. No nausea or indigestion   He continues to work with therapy and is able to ambulate with a walker.   Has left hip pain one week ago due to scoliosis. Tylenol increased and symptoms improved.    Past Medical History:  Diagnosis Date  . Acute kidney injury ([place]) 03/06/14  . Anemia, unspecified 03/06/14  . Arthritis   . BPH (benign prostatic hyperplasia)   . Cerebral atrophy ([place]) 03/06/14  . Cerebrovascular disease 03/06/14  . Degenerative disc disease, lumbar 03/06/14  . Diverticula, bladder acquired   . Edema 03/06/14  . GERD (gastroesophageal reflux disease)   . H/O: GI bleed 1986  . Hiatal hernia   . History of fall 03/06/14  . Hypercalcemia 03/06/14  . Hyperlipidemia   . Hypertension   . Impotence   . LFT elevation 03/06/14  . Lumbago 03/06/14  . Mallory-Weiss tear 1986  . Osteoporosis   . Rhabdomyolysis 03/06/14  . Scoliosis of cervical spine 03/06/14  . Scoliosis of lumbar spine 03/06/14  . Shingles   . Spermatocele    bilateral, left greater than right  . Troponin level elevated 03/06/14  . Weak 03/06/14   Past Surgical History:  Procedure Laterality Date  . BACK SURGERY  10/2009   ESI for surgery for lumbar spinal stenosis  . BIOPSY  11/24/2020   Procedure:  BIOPSY;  Surgeon: Milus Banister, MD;  Location: [facility];  Service: Endoscopy;;  . clubbed toe repair  2004  . COLONOSCOPY  08/15/2011  . ESOPHAGOGASTRODUODENOSCOPY (EGD) WITH PROPOFOL N/A 11/24/2020   Procedure: ESOPHAGOGASTRODUODENOSCOPY (EGD) WITH PROPOFOL;  Surgeon: Milus Banister, MD;  Location: [facility];  Service: Endoscopy;  Laterality: N/A;  . HIATAL HERNIA REPAIR N/A 11/28/2020   Procedure: Gastrostomy tube placement;  Surgeon: Ralene Ok, MD;  Location: [facility] ORS;  Service: General;  Laterality: N/A;  90 MINUTES  . INGUINAL HERNIA REPAIR Right 11/26/2004   Dr Rebekah Chesterfield - open w mesh  .  REPLACEMENT TOTAL KNEE BILATERAL  2007, 2008  . TONSILLECTOMY AND ADENOIDECTOMY  1939  . X-STOP IMPLANTATION      Allergies  Allergen Reactions  . Amrix [Cyclobenzaprine]   . Shellfish Allergy     Outpatient Encounter Medications as of 12/13/2020  Medication Sig  . acetaminophen (TYLENOL) 500 MG tablet Take 1,000 mg by mouth in the morning, at noon, and at bedtime.  Marland Kitchen antiseptic oral rinse (BIOTENE) LIQD 15 mLs by Mouth Rinse route 4 (four) times daily as needed for dry mouth.  . bisacodyl (DULCOLAX) 10 MG suppository Place 10 mg rectally as needed for moderate constipation.  . lactose free nutrition (BOOST) LIQD Take 237 mLs by mouth 2 (two) times daily between meals.  Marland Kitchen levothyroxine (SYNTHROID) 25 MCG tablet Take 1 tablet (25 mcg total) by mouth daily before breakfast.  . Melatonin 5 MG CAPS Take 1 capsule (5 mg total) by mouth at bedtime.  . pantoprazole (PROTONIX) 40 MG tablet Take 1 tablet (40 mg total) by mouth 2 (two) times daily for 30 days, THEN 1 tablet (40 mg total) daily.  Vladimir Faster Glycol-Propyl Glycol (SYSTANE) 0.4-0.3 % SOLN Two drops both eyes three times daily as needed for dry itchy eyes.  . tamsulosin (FLOMAX) 0.4 MG CAPS capsule Take 1 capsule (0.4 mg total) by mouth daily.  Marland Kitchen triamcinolone cream (KENALOG) 0.1 % Apply 1 application topically as needed.  . vitamin B-12 (CYANOCOBALAMIN) 1000 MCG tablet Take 500 mcg by mouth daily.   . [DISCONTINUED] amLODipine (NORVASC) 2.5 MG tablet Take 1 tablet by mouth daily.  . ondansetron (ZOFRAN) 4 MG tablet Take 4 mg by mouth every 6 (six) hours as needed for nausea or vomiting.  Marland Kitchen QUEtiapine (SEROQUEL) 25 MG tablet Take 25 mg by mouth 2 (two) times daily.    No facility-administered encounter medications on file as of 12/13/2020.    Review of Systems  Constitutional: Positive for appetite change. Negative for activity change, chills, diaphoresis, fatigue, fever and unexpected weight change.  HENT: Negative for congestion  and trouble swallowing.   Respiratory: Negative for cough, shortness of breath, wheezing and stridor.   Cardiovascular: Positive for leg swelling. Negative for chest pain and palpitations.  Gastrointestinal: Negative for abdominal distention, abdominal pain, anal bleeding, blood in stool, constipation, diarrhea, nausea, rectal pain and vomiting.  Genitourinary: Negative for decreased urine volume, difficulty urinating, dysuria, hematuria, penile discharge and penile swelling.       Foley  Musculoskeletal: Positive for gait problem. Negative for arthralgias, back pain, joint swelling and myalgias.  Skin: Positive for rash (improved).  Neurological: Negative for dizziness, seizures, syncope, facial asymmetry, speech difficulty, weakness and headaches.  Hematological: Negative for adenopathy. Does not bruise/bleed easily.  Psychiatric/Behavioral: Positive for confusion (memory loss). Negative for agitation and behavioral problems.    Immunization History  Administered Date(s) Administered  . Influenza, High Dose Seasonal PF 06/08/2020  .  Moderna Sars-Covid-2 Vaccination 09/09/2019, 09/27/2019  . Td 09/02/2007  . Tdap 11/11/2017   Pertinent  Health Maintenance Due  Topic Date Due  . PNA vac Low Risk Adult (1 of 2 - PCV13) Never done  . DEXA SCAN  02/28/2021 (Originally 12/30/2014)  . INFLUENZA VACCINE  Discontinued   Fall Risk  02/29/2020 10/19/2019 06/13/2019 03/16/2019 05/12/2018  Falls in the past year? 0 0 0 0 No  Number falls in past yr: 0 0 0 0 -  Injury with Fall? 0 0 0 0 -  Risk for fall due to : - - Impaired balance/gait - -  Follow up - - Falls evaluation completed - -   Functional Status Survey:    Vitals:   12/14/20 1155  BP: 109/64  Pulse: 64  Resp: 20  Temp: 97.7 F (36.5 C)  SpO2: 100%  Weight: 139 lb 6.4 oz (63.2 kg)   Body mass index is 23.93 kg/m. Physical Exam Vitals and nursing note reviewed.  Constitutional:      General: He is not in acute distress.     Appearance: He is not diaphoretic.  HENT:     Head: Normocephalic and atraumatic.  Neck:     Thyroid: No thyromegaly.     Vascular: No JVD.     Trachea: No tracheal deviation.  Cardiovascular:     Rate and Rhythm: Normal rate and regular rhythm.     Heart sounds: No murmur heard.   Pulmonary:     Effort: Pulmonary effort is normal. No respiratory distress.     Breath sounds: Normal breath sounds. No wheezing.  Abdominal:     General: Bowel sounds are normal. There is no distension.     Palpations: Abdomen is soft.     Tenderness: There is no abdominal tenderness. There is no right CVA tenderness, left CVA tenderness or guarding.     Comments: g tube site sutures in place with mild surrounding erythema. No purulent drainage or swelling.   Genitourinary:    Comments: Foley cath with clear yellow urine Musculoskeletal:        General: Deformity (soliosis and left rib protrusion due to this. ) present.     Right hip: Normal.     Left hip: No deformity, lacerations, tenderness, bony tenderness or crepitus. Normal range of motion. Normal strength.     Right lower leg: Edema (+1) present.     Left lower leg: Edema (+1) present.  Lymphadenopathy:     Cervical: No cervical adenopathy.  Skin:    General: Skin is warm and dry.  Neurological:     Mental Status: He is alert and oriented to person, place, and time.  Psychiatric:        Mood and Affect: Mood normal.     Labs reviewed: Recent Labs    11/26/20 0332 11/27/20 0327 11/28/20 0340 11/29/20 0338 12/06/20 0000 12/10/20 0000  NA 134* 134* 132* 136 134* 133*  K 3.9 4.0 3.9 4.3 3.6 3.8  CL 109 106 102 105 100 97*  CO2 19* 21* 20* 22 26* 23*  GLUCOSE 103* 97 98 132*  --   --   BUN 24* 21 17 20 21 19   CREATININE 1.38* 1.29* 1.49* 1.49* 1.2 1.3  CALCIUM 7.7* 7.9* 7.7* 8.1* 7.8* 8.2*  MG 1.7 2.0 1.8 2.0  --   --   PHOS 2.6 3.3 3.0  --   --   --    Recent Labs    11/27/20 0327 11/28/20  0340 11/29/20 0338 12/06/20 0000  12/10/20 0000  AST 16 15 16 15 17   ALT 14 12 12 11 15   ALKPHOS 60 64 68 86 97  BILITOT 0.9 0.8 0.7  --   --   PROT 5.6* 5.7* 6.4*  --   --   ALBUMIN 2.5* 2.6* 2.7* 2.8* 3.0*   Recent Labs    11/26/20 0332 11/27/20 0327 11/28/20 0340 11/29/20 0338 12/06/20 0000 12/10/20 0000  WBC 9.2 8.4 9.2 12.1* 12.2 8.9  NEUTROABS 5.7 5.1 5.8  --   --   --   HGB 8.1* 7.6*  8.4* 8.4* 9.4* 7.9* 8.5*  HCT 24.8* 23.8*  26.1* 26.9* 29.7* 24* 26*  MCV 90.8 91.3 93.4 92.2  --   --   PLT 240 265 284 262 463* 588*   Lab Results  Component Value Date   TSH 3.249 11/26/2020   Lab Results  Component Value Date   HGBA1C 5.6 11/26/2020   No results found for: CHOL, HDL, LDLCALC, LDLDIRECT, TRIG, CHOLHDL  Significant Diagnostic Results in last 30 days:  CT CHEST W CONTRAST  Result Date: 11/24/2020 CLINICAL DATA:  Hematemesis, suspect large hiatal hernia EXAM: CT CHEST, ABDOMEN, AND PELVIS WITH CONTRAST TECHNIQUE: Multidetector CT imaging of the chest, abdomen and pelvis was performed following the standard protocol during bolus administration of intravenous contrast. CONTRAST:  63mL OMNIPAQUE IOHEXOL 300 MG/ML SOLN, additional oral enteric contrast COMPARISON:  None. FINDINGS: CT CHEST FINDINGS Cardiovascular: Aortic atherosclerosis. Normal heart size. Scattered coronary artery calcifications. No pericardial effusion. Mediastinum/Nodes: No enlarged mediastinal, hilar, or axillary lymph nodes. Large hiatal hernia with complete intrathoracic position of the stomach as well as portions of the pancreatic head and neck and multiple loops of nonobstructed small bowel. Thyroid gland, trachea, and esophagus demonstrate no significant findings. Lungs/Pleura: Trace bilateral pleural effusions. Compressive atelectasis of the right lower lobe associated with a large hiatal hernia. Musculoskeletal: No chest wall mass or suspicious bone lesions identified. CT ABDOMEN PELVIS FINDINGS Hepatobiliary: No solid liver  abnormality is seen. No gallstones, gallbladder wall thickening, or biliary dilatation. Pancreas: Unremarkable. No pancreatic ductal dilatation or surrounding inflammatory changes. Spleen: Normal in size without significant abnormality. Adrenals/Urinary Tract: Adrenal glands are unremarkable. Severe bilateral hydronephrosis and hydroureter to the ureterovesicular junctions. There is severe urinary bladder wall thickening and distention of the urinary bladder. Stomach/Bowel: Stomach is within normal limits. Appendix appears normal. No evidence of bowel wall thickening, distention, or inflammatory changes. Vascular/Lymphatic: Aortic atherosclerosis. No enlarged abdominal or pelvic lymph nodes. Reproductive: TURP defect of the prostate. Other: There is a midline epigastric hernia containing multiple loops of nonobstructed small bowel (series 2, image 72). No abdominopelvic ascites. Musculoskeletal: No acute or significant osseous findings. IMPRESSION: 1. Large hiatal hernia with complete intrathoracic position of the stomach as well as portions of the pancreatic head and neck and multiple loops of nonobstructed small bowel. 2. Trace bilateral pleural effusions. Compressive atelectasis of the right lower lobe associated with a large hiatal hernia. 3. Severe bilateral hydronephrosis and hydroureter to the ureterovesicular junctions. There is severe urinary bladder wall thickening and distention of the urinary bladder. No obstructing calculi or other lesion identified. 4. TURP defect of the prostate. 5. There is a midline epigastric hernia containing multiple loops of nonobstructed small bowel. 6. Coronary artery disease. Aortic Atherosclerosis (ICD10-I70.0). Electronically Signed   By: Eddie Candle M.D.   On: 11/24/2020 23:27   CT ABDOMEN PELVIS W CONTRAST  Result Date: 11/24/2020 CLINICAL DATA:  Hematemesis, suspect large  hiatal hernia EXAM: CT CHEST, ABDOMEN, AND PELVIS WITH CONTRAST TECHNIQUE: Multidetector CT  imaging of the chest, abdomen and pelvis was performed following the standard protocol during bolus administration of intravenous contrast. CONTRAST:  64mL OMNIPAQUE IOHEXOL 300 MG/ML SOLN, additional oral enteric contrast COMPARISON:  None. FINDINGS: CT CHEST FINDINGS Cardiovascular: Aortic atherosclerosis. Normal heart size. Scattered coronary artery calcifications. No pericardial effusion. Mediastinum/Nodes: No enlarged mediastinal, hilar, or axillary lymph nodes. Large hiatal hernia with complete intrathoracic position of the stomach as well as portions of the pancreatic head and neck and multiple loops of nonobstructed small bowel. Thyroid gland, trachea, and esophagus demonstrate no significant findings. Lungs/Pleura: Trace bilateral pleural effusions. Compressive atelectasis of the right lower lobe associated with a large hiatal hernia. Musculoskeletal: No chest wall mass or suspicious bone lesions identified. CT ABDOMEN PELVIS FINDINGS Hepatobiliary: No solid liver abnormality is seen. No gallstones, gallbladder wall thickening, or biliary dilatation. Pancreas: Unremarkable. No pancreatic ductal dilatation or surrounding inflammatory changes. Spleen: Normal in size without significant abnormality. Adrenals/Urinary Tract: Adrenal glands are unremarkable. Severe bilateral hydronephrosis and hydroureter to the ureterovesicular junctions. There is severe urinary bladder wall thickening and distention of the urinary bladder. Stomach/Bowel: Stomach is within normal limits. Appendix appears normal. No evidence of bowel wall thickening, distention, or inflammatory changes. Vascular/Lymphatic: Aortic atherosclerosis. No enlarged abdominal or pelvic lymph nodes. Reproductive: TURP defect of the prostate. Other: There is a midline epigastric hernia containing multiple loops of nonobstructed small bowel (series 2, image 72). No abdominopelvic ascites. Musculoskeletal: No acute or significant osseous findings.  IMPRESSION: 1. Large hiatal hernia with complete intrathoracic position of the stomach as well as portions of the pancreatic head and neck and multiple loops of nonobstructed small bowel. 2. Trace bilateral pleural effusions. Compressive atelectasis of the right lower lobe associated with a large hiatal hernia. 3. Severe bilateral hydronephrosis and hydroureter to the ureterovesicular junctions. There is severe urinary bladder wall thickening and distention of the urinary bladder. No obstructing calculi or other lesion identified. 4. TURP defect of the prostate. 5. There is a midline epigastric hernia containing multiple loops of nonobstructed small bowel. 6. Coronary artery disease. Aortic Atherosclerosis (ICD10-I70.0). Electronically Signed   By: Eddie Candle M.D.   On: 11/24/2020 23:27    Assessment/Plan  1. Cameron ulcer, acute with bleeding & anemia Led to GIB with anemia GI signed off Now on protonix bid with no incidence of bleeding or indigestion   2. Hiatal hernia S/p repair  3. S/P gastric surgery Followed by surgery   4. Iron deficiency anemia due to chronic blood loss Slight improvement in Hgb  Recheck Monday if able to tolerate could consider starting iron   5. Stage 3b chronic kidney disease ([place]) Continue to periodically monitor BMP and avoid nephrotoxic agents  6. Protein-calorie malnutrition, moderate ([place]) Starting to eat more solid food and tolerating well Continue boost bid and monitor weight weekly Continue G tube with water flushes   7. Left hip pain Improved with PT and tylenol   8. HTN D/C norvasc due to low bp   Labs/tests ordered:  CBC BMP 5/2

## 2020-12-15 DIAGNOSIS — N418 Other inflammatory diseases of prostate: Secondary | ICD-10-CM | POA: Diagnosis not present

## 2020-12-15 DIAGNOSIS — N390 Urinary tract infection, site not specified: Secondary | ICD-10-CM | POA: Diagnosis not present

## 2020-12-17 ENCOUNTER — Encounter: Payer: Self-pay | Admitting: Adult Health

## 2020-12-17 ENCOUNTER — Non-Acute Institutional Stay (SKILLED_NURSING_FACILITY): Payer: Medicare Other | Admitting: Adult Health

## 2020-12-17 DIAGNOSIS — R197 Diarrhea, unspecified: Secondary | ICD-10-CM | POA: Diagnosis not present

## 2020-12-17 DIAGNOSIS — N3000 Acute cystitis without hematuria: Secondary | ICD-10-CM

## 2020-12-17 DIAGNOSIS — Z903 Acquired absence of stomach [part of]: Secondary | ICD-10-CM | POA: Diagnosis not present

## 2020-12-17 DIAGNOSIS — K2101 Gastro-esophageal reflux disease with esophagitis, with bleeding: Secondary | ICD-10-CM | POA: Diagnosis not present

## 2020-12-17 DIAGNOSIS — R278 Other lack of coordination: Secondary | ICD-10-CM | POA: Diagnosis not present

## 2020-12-17 DIAGNOSIS — R1084 Generalized abdominal pain: Secondary | ICD-10-CM | POA: Diagnosis not present

## 2020-12-17 DIAGNOSIS — R2689 Other abnormalities of gait and mobility: Secondary | ICD-10-CM | POA: Diagnosis not present

## 2020-12-17 DIAGNOSIS — R339 Retention of urine, unspecified: Secondary | ICD-10-CM | POA: Diagnosis not present

## 2020-12-17 DIAGNOSIS — R2681 Unsteadiness on feet: Secondary | ICD-10-CM | POA: Diagnosis not present

## 2020-12-17 NOTE — Progress Notes (Signed)
Location:    [facility] Room Number: 156 Place of Service:  SNF [phone]) Provider:  Royal Hawthorn, NP  [name], MD  Patient Care Team: [name], MD as PCP - General (Internal Medicine)  Extended Emergency Contact Information Primary Emergency Contact: Rudean Hitt Address: [place], [place] [address] [name] Phone: [phone] Mobile Phone: [phone] Relation: Daughter Secondary Emergency Contact: Simms,Carol Address: [address]          [place], [place] [address] [place] Phone: [phone] Work Phone: [phone] Relation: Other  Code Status:  DNR Goals of care: Advanced Directive information Advanced Directives 12/17/2020  Does Patient Have a Medical Advance Directive? Yes  Type of Advance Directive Out of facility DNR (pink MOST or yellow form)  Does patient want to make changes to medical advance directive? No - Patient declined  Copy of [place] in Chart? -  Would patient like information on creating a medical advance directive? -  Pre-existing out of facility DNR order (yellow form or pink MOST form) Pink MOST/Yellow Form most recent copy in chart - Physician notified to receive inpatient order     Chief Complaint  Patient presents with  . Acute Visit    UTI    HPI:  Pt is a 87 y.o. male seen today for an acute visit for UTI and diarrhea. He is s/p hospitalization for acute GIB with cameron ulcer and large hiatal hernia.  He is s/p  Gastric reduction and gastropexy by Dr. Rosendo Gros on 11/28/20. He also has a foley cath in place due to urinary retention and bilateral hydronephrosis.  UA C and S was obtained for pelvic pain and temp 99.5 and returned with >100,00 colonies E coli . He has received one dose of rocephin. His urine was chocolate milk in appearance and is now beginning to clear. He denies any current urinary  symptoms.   The nurse also reports watery stools this am. He had some loose stools last week but they were not frequent. He is not having any vomiting or abd pain.  He has a hx of IBS with diarrhea. No blood observed per nurse in the stool.  BP ok. Eating and drinking small amts. Taking nutritional supplement.    Past Medical History:  Diagnosis Date  . Acute kidney injury ([place]) 03/06/14  . Anemia, unspecified 03/06/14  . Arthritis   . BPH (benign prostatic hyperplasia)   . Cerebral atrophy ([place]) 03/06/14  . Cerebrovascular disease 03/06/14  . Degenerative disc disease, lumbar 03/06/14  . Diverticula, bladder acquired   . Edema 03/06/14  . GERD (gastroesophageal reflux disease)   . H/O: GI bleed 1986  . Hiatal hernia   . History of fall 03/06/14  . Hypercalcemia 03/06/14  . Hyperlipidemia   . Hypertension   . Impotence   . LFT elevation 03/06/14  . Lumbago 03/06/14  . Mallory-Weiss tear 1986  . Osteoporosis   . Rhabdomyolysis 03/06/14  . Scoliosis of cervical spine 03/06/14  . Scoliosis of lumbar spine 03/06/14  . Shingles   . Spermatocele    bilateral, left greater than right  . Troponin level elevated 03/06/14  . Weak 03/06/14   Past Surgical History:  Procedure Laterality Date  . BACK SURGERY  10/2009   ESI for surgery for lumbar spinal stenosis  . BIOPSY  11/24/2020   Procedure: BIOPSY;  Surgeon: Milus Banister, MD;  Location: [facility];  Service: Endoscopy;;  . clubbed toe repair  2004  . COLONOSCOPY  08/15/2011  . ESOPHAGOGASTRODUODENOSCOPY (EGD) WITH PROPOFOL N/A 11/24/2020   Procedure: ESOPHAGOGASTRODUODENOSCOPY (EGD) WITH PROPOFOL;  Surgeon: Milus Banister, MD;  Location: [facility];  Service: Endoscopy;  Laterality: N/A;  . HIATAL HERNIA REPAIR N/A 11/28/2020   Procedure: Gastrostomy tube placement;  Surgeon: Ralene Ok, MD;  Location: [facility] ORS;  Service: General;  Laterality: N/A;  90 MINUTES  . INGUINAL HERNIA REPAIR Right 11/26/2004   Dr Rebekah Chesterfield - open w mesh  .  REPLACEMENT TOTAL KNEE BILATERAL  2007, 2008  . TONSILLECTOMY AND ADENOIDECTOMY  1939  . X-STOP IMPLANTATION      Allergies  Allergen Reactions  . Amrix [Cyclobenzaprine]   . Shellfish Allergy     Allergies as of 12/17/2020      Reactions   Amrix [cyclobenzaprine]    Shellfish Allergy       Medication List       Accurate as of Dec 17, 2020 12:52 PM. If you have any questions, ask your nurse or doctor.        acetaminophen 500 MG tablet Commonly known as: TYLENOL Take 1,000 mg by mouth in the morning, at noon, and at bedtime.   acetaminophen 325 MG tablet Commonly known as: TYLENOL Take 650 mg by mouth every 6 (six) hours as needed.   antiseptic oral rinse Liqd 15 mLs by Mouth Rinse route 4 (four) times daily as needed for dry mouth.   bisacodyl 10 MG suppository Commonly known as: DULCOLAX Place 10 mg rectally as needed for moderate constipation.   lactose free nutrition Liqd Take 237 mLs by mouth 2 (two) times daily between meals.   levothyroxine 25 MCG tablet Commonly known as: SYNTHROID Take 1 tablet (25 mcg total) by mouth daily before breakfast.   Melatonin 5 MG Caps Take 1 capsule (5 mg total) by mouth at bedtime.   mupirocin ointment 2 % Commonly known as: BACTROBAN 1 application 2 (two) times daily.   ondansetron 4 MG tablet Commonly known as: ZOFRAN Take 4 mg by mouth every 6 (six) hours as needed for nausea or vomiting.   pantoprazole 40 MG tablet Commonly known as: PROTONIX Take 1 tablet (40 mg total) by mouth 2 (two) times daily for 30 days, THEN 1 tablet (40 mg total) daily. Start taking on: November 30, 2020   QUEtiapine 25 MG tablet Commonly known as: SEROQUEL Take 12.5 mg by mouth 2 (two) times daily.   Systane 0.4-0.3 % Soln Generic drug: Polyethyl Glycol-Propyl Glycol Two drops both eyes three times daily as needed for dry itchy eyes.   tamsulosin 0.4 MG Caps capsule Commonly known as: FLOMAX Take 1 capsule (0.4 mg total) by mouth  daily.   triamcinolone cream 0.1 % Commonly known as: KENALOG Apply 1 application topically as needed.   vitamin B-12 1000 MCG tablet Commonly known as: CYANOCOBALAMIN Take 500 mcg by mouth daily.       Review of Systems  Constitutional: Positive for activity change and appetite change. Negative for chills, diaphoresis, fatigue, fever and unexpected weight change.  Respiratory: Negative for cough, shortness of breath, wheezing and stridor.   Cardiovascular: Positive for leg swelling. Negative for chest pain and palpitations.  Gastrointestinal: Positive for diarrhea. Negative for abdominal distention, abdominal pain, constipation, nausea, rectal pain and vomiting.  Genitourinary: Negative for difficulty urinating, dysuria, flank pain, frequency and urgency.  Musculoskeletal: Positive for gait  problem. Negative for arthralgias, back pain, joint swelling and myalgias.       Left hip pain and low chronic back pain   Neurological: Negative for dizziness, seizures, syncope, facial asymmetry, speech difficulty, weakness and headaches.  Hematological: Negative for adenopathy. Does not bruise/bleed easily.  Psychiatric/Behavioral: Positive for confusion. Negative for agitation and behavioral problems.    Immunization History  Administered Date(s) Administered  . Influenza, High Dose Seasonal PF 06/08/2020  . Moderna Sars-Covid-2 Vaccination 09/09/2019, 09/27/2019  . Td 09/02/2007  . Tdap 11/11/2017   Pertinent  Health Maintenance Due  Topic Date Due  . PNA vac Low Risk Adult (1 of 2 - PCV13) Never done  . DEXA SCAN  02/28/2021 (Originally 12/30/2014)  . INFLUENZA VACCINE  Discontinued   Fall Risk  02/29/2020 10/19/2019 06/13/2019 03/16/2019 05/12/2018  Falls in the past year? 0 0 0 0 No  Number falls in past yr: 0 0 0 0 -  Injury with Fall? 0 0 0 0 -  Risk for fall due to : - - Impaired balance/gait - -  Follow up - - Falls evaluation completed - -   Functional Status Survey:     Vitals:   12/17/20 0921  BP: (!) 95/53  Pulse: 73  Resp: 17  Temp: 97.9 F (36.6 C)  SpO2: 99%  Weight: 139 lb 6.4 oz (63.2 kg)  Height: 5\' 4"  (1.626 m)   Body mass index is 23.93 kg/m. Physical Exam Vitals and nursing note reviewed.  Constitutional:      General: He is not in acute distress.    Appearance: He is not diaphoretic.  HENT:     Head: Normocephalic and atraumatic.     Mouth/Throat:     Mouth: Mucous membranes are moist.     Pharynx: Oropharynx is clear. No oropharyngeal exudate.  Neck:     Thyroid: No thyromegaly.     Vascular: No JVD.     Trachea: No tracheal deviation.  Cardiovascular:     Rate and Rhythm: Normal rate and regular rhythm.     Heart sounds: No murmur heard.   Pulmonary:     Effort: Pulmonary effort is normal. No respiratory distress.     Breath sounds: Normal breath sounds. No wheezing.  Abdominal:     General: Bowel sounds are normal. There is no distension.     Palpations: Abdomen is soft.     Tenderness: There is no abdominal tenderness.     Comments: gtube in place CDI  Genitourinary:    Comments: Urine purulent  Musculoskeletal:     Comments: Kyphoscoliosis.   Lymphadenopathy:     Cervical: No cervical adenopathy.  Skin:    General: Skin is warm and dry.  Neurological:     General: No focal deficit present.     Mental Status: He is alert. Mental status is at baseline.  Psychiatric:        Mood and Affect: Mood normal.     Labs reviewed: Recent Labs    11/26/20 0332 11/27/20 0327 11/28/20 0340 11/29/20 0338 12/06/20 0000 12/10/20 0000  NA 134* 134* 132* 136 134* 133*  K 3.9 4.0 3.9 4.3 3.6 3.8  CL 109 106 102 105 100 97*  CO2 19* 21* 20* 22 26* 23*  GLUCOSE 103* 97 98 132*  --   --   BUN 24* 21 17 20 21 19   CREATININE 1.38* 1.29* 1.49* 1.49* 1.2 1.3  CALCIUM 7.7* 7.9* 7.7* 8.1* 7.8* 8.2*  MG 1.7 2.0  1.8 2.0  --   --   PHOS 2.6 3.3 3.0  --   --   --    Recent Labs    11/27/20 0327 11/28/20 0340  11/29/20 0338 12/06/20 0000 12/10/20 0000  AST 16 15 16 15 17   ALT 14 12 12 11 15   ALKPHOS 60 64 68 86 97  BILITOT 0.9 0.8 0.7  --   --   PROT 5.6* 5.7* 6.4*  --   --   ALBUMIN 2.5* 2.6* 2.7* 2.8* 3.0*   Recent Labs    11/26/20 0332 11/27/20 0327 11/28/20 0340 11/29/20 0338 12/06/20 0000 12/10/20 0000  WBC 9.2 8.4 9.2 12.1* 12.2 8.9  NEUTROABS 5.7 5.1 5.8  --   --   --   HGB 8.1* 7.6*  8.4* 8.4* 9.4* 7.9* 8.5*  HCT 24.8* 23.8*  26.1* 26.9* 29.7* 24* 26*  MCV 90.8 91.3 93.4 92.2  --   --   PLT 240 265 284 262 463* 588*   Lab Results  Component Value Date   TSH 3.249 11/26/2020   Lab Results  Component Value Date   HGBA1C 5.6 11/26/2020   No results found for: CHOL, HDL, LDLCALC, LDLDIRECT, TRIG, CHOLHDL  Significant Diagnostic Results in last 30 days:  CT CHEST W CONTRAST  Result Date: 11/24/2020 CLINICAL DATA:  Hematemesis, suspect large hiatal hernia EXAM: CT CHEST, ABDOMEN, AND PELVIS WITH CONTRAST TECHNIQUE: Multidetector CT imaging of the chest, abdomen and pelvis was performed following the standard protocol during bolus administration of intravenous contrast. CONTRAST:  73mL OMNIPAQUE IOHEXOL 300 MG/ML SOLN, additional oral enteric contrast COMPARISON:  None. FINDINGS: CT CHEST FINDINGS Cardiovascular: Aortic atherosclerosis. Normal heart size. Scattered coronary artery calcifications. No pericardial effusion. Mediastinum/Nodes: No enlarged mediastinal, hilar, or axillary lymph nodes. Large hiatal hernia with complete intrathoracic position of the stomach as well as portions of the pancreatic head and neck and multiple loops of nonobstructed small bowel. Thyroid gland, trachea, and esophagus demonstrate no significant findings. Lungs/Pleura: Trace bilateral pleural effusions. Compressive atelectasis of the right lower lobe associated with a large hiatal hernia. Musculoskeletal: No chest wall mass or suspicious bone lesions identified. CT ABDOMEN PELVIS FINDINGS  Hepatobiliary: No solid liver abnormality is seen. No gallstones, gallbladder wall thickening, or biliary dilatation. Pancreas: Unremarkable. No pancreatic ductal dilatation or surrounding inflammatory changes. Spleen: Normal in size without significant abnormality. Adrenals/Urinary Tract: Adrenal glands are unremarkable. Severe bilateral hydronephrosis and hydroureter to the ureterovesicular junctions. There is severe urinary bladder wall thickening and distention of the urinary bladder. Stomach/Bowel: Stomach is within normal limits. Appendix appears normal. No evidence of bowel wall thickening, distention, or inflammatory changes. Vascular/Lymphatic: Aortic atherosclerosis. No enlarged abdominal or pelvic lymph nodes. Reproductive: TURP defect of the prostate. Other: There is a midline epigastric hernia containing multiple loops of nonobstructed small bowel (series 2, image 72). No abdominopelvic ascites. Musculoskeletal: No acute or significant osseous findings. IMPRESSION: 1. Large hiatal hernia with complete intrathoracic position of the stomach as well as portions of the pancreatic head and neck and multiple loops of nonobstructed small bowel. 2. Trace bilateral pleural effusions. Compressive atelectasis of the right lower lobe associated with a large hiatal hernia. 3. Severe bilateral hydronephrosis and hydroureter to the ureterovesicular junctions. There is severe urinary bladder wall thickening and distention of the urinary bladder. No obstructing calculi or other lesion identified. 4. TURP defect of the prostate. 5. There is a midline epigastric hernia containing multiple loops of nonobstructed small bowel. 6. Coronary artery disease. Aortic Atherosclerosis (  ICD10-I70.0). Electronically Signed   By: Eddie Candle M.D.   On: 11/24/2020 23:27   CT ABDOMEN PELVIS W CONTRAST  Result Date: 11/24/2020 CLINICAL DATA:  Hematemesis, suspect large hiatal hernia EXAM: CT CHEST, ABDOMEN, AND PELVIS WITH CONTRAST  TECHNIQUE: Multidetector CT imaging of the chest, abdomen and pelvis was performed following the standard protocol during bolus administration of intravenous contrast. CONTRAST:  38mL OMNIPAQUE IOHEXOL 300 MG/ML SOLN, additional oral enteric contrast COMPARISON:  None. FINDINGS: CT CHEST FINDINGS Cardiovascular: Aortic atherosclerosis. Normal heart size. Scattered coronary artery calcifications. No pericardial effusion. Mediastinum/Nodes: No enlarged mediastinal, hilar, or axillary lymph nodes. Large hiatal hernia with complete intrathoracic position of the stomach as well as portions of the pancreatic head and neck and multiple loops of nonobstructed small bowel. Thyroid gland, trachea, and esophagus demonstrate no significant findings. Lungs/Pleura: Trace bilateral pleural effusions. Compressive atelectasis of the right lower lobe associated with a large hiatal hernia. Musculoskeletal: No chest wall mass or suspicious bone lesions identified. CT ABDOMEN PELVIS FINDINGS Hepatobiliary: No solid liver abnormality is seen. No gallstones, gallbladder wall thickening, or biliary dilatation. Pancreas: Unremarkable. No pancreatic ductal dilatation or surrounding inflammatory changes. Spleen: Normal in size without significant abnormality. Adrenals/Urinary Tract: Adrenal glands are unremarkable. Severe bilateral hydronephrosis and hydroureter to the ureterovesicular junctions. There is severe urinary bladder wall thickening and distention of the urinary bladder. Stomach/Bowel: Stomach is within normal limits. Appendix appears normal. No evidence of bowel wall thickening, distention, or inflammatory changes. Vascular/Lymphatic: Aortic atherosclerosis. No enlarged abdominal or pelvic lymph nodes. Reproductive: TURP defect of the prostate. Other: There is a midline epigastric hernia containing multiple loops of nonobstructed small bowel (series 2, image 72). No abdominopelvic ascites. Musculoskeletal: No acute or significant  osseous findings. IMPRESSION: 1. Large hiatal hernia with complete intrathoracic position of the stomach as well as portions of the pancreatic head and neck and multiple loops of nonobstructed small bowel. 2. Trace bilateral pleural effusions. Compressive atelectasis of the right lower lobe associated with a large hiatal hernia. 3. Severe bilateral hydronephrosis and hydroureter to the ureterovesicular junctions. There is severe urinary bladder wall thickening and distention of the urinary bladder. No obstructing calculi or other lesion identified. 4. TURP defect of the prostate. 5. There is a midline epigastric hernia containing multiple loops of nonobstructed small bowel. 6. Coronary artery disease. Aortic Atherosclerosis (ICD10-I70.0). Electronically Signed   By: Eddie Candle M.D.   On: 11/24/2020 23:27   ECHOCARDIOGRAM COMPLETE  Result Date: 11/27/2020    ECHOCARDIOGRAM REPORT   Patient Name:   GRACIN MCPARTLAND Date of Exam: 11/27/2020 Medical Rec #:  [id number]  Height:       68.0 in Accession #:    [id number] Weight:       148.0 lb Date of Birth:  1924-10-19  BSA:          1.798 m Patient Age:    89 years   BP:           98/68 mmHg Patient Gender: M          HR:           75 bpm. Exam Location:  Inpatient Procedure: 2D Echo, Cardiac Doppler and Color Doppler Indications:    Z01.818 Encounter for other preprocedural examination  History:        Patient has prior history of Echocardiogram examinations, most                 recent 02/27/2014. Risk Factors:Hypertension and Dyslipidemia.  Scoliosis. GERD.  Sonographer:    Jonelle Sidle Dance Referring Phys: [id number]  1. Left ventricular ejection fraction, by estimation, is 45 to 50%. The left ventricle has mildly decreased function. The left ventricle has no regional wall motion abnormalities. There is mild left ventricular hypertrophy. Left ventricular diastolic parameters are consistent with Grade I diastolic dysfunction (impaired  relaxation).  2. Right ventricular systolic function is normal. The right ventricular size is normal. There is normal pulmonary artery systolic pressure.  3. The mitral valve is grossly normal. Mild mitral valve regurgitation.  4. The aortic valve is normal in structure. Aortic valve regurgitation is not visualized. No aortic stenosis is present.  5. Aortic dilatation noted. There is mild dilatation of the ascending aorta, measuring 41 mm. FINDINGS  Left Ventricle: Left ventricular ejection fraction, by estimation, is 45 to 50%. The left ventricle has mildly decreased function. The left ventricle has no regional wall motion abnormalities. The left ventricular internal cavity size was normal in size. There is mild left ventricular hypertrophy. Left ventricular diastolic parameters are consistent with Grade I diastolic dysfunction (impaired relaxation). Right Ventricle: The right ventricular size is normal. Right vetricular wall thickness was not well visualized. Right ventricular systolic function is normal. There is normal pulmonary artery systolic pressure. The tricuspid regurgitant velocity is 1.77 m/s, and with an assumed right atrial pressure of 3 mmHg, the estimated right ventricular systolic pressure is 19.6 mmHg. Left Atrium: Left atrial size was normal in size. Right Atrium: Right atrial size was normal in size. Pericardium: There is no evidence of pericardial effusion. Mitral Valve: The mitral valve is grossly normal. Mild mitral valve regurgitation. Tricuspid Valve: The tricuspid valve is grossly normal. Tricuspid valve regurgitation is trivial. Aortic Valve: The aortic valve is normal in structure. There is mild to moderate aortic valve annular calcification. Aortic valve regurgitation is not visualized. No aortic stenosis is present. Aortic valve mean gradient measures 4.0 mmHg. Aortic valve peak gradient measures 8.6 mmHg. Aortic valve area, by VTI measures 2.88 cm. Pulmonic Valve: The pulmonic valve  was normal in structure. Pulmonic valve regurgitation is mild to moderate. No evidence of pulmonic stenosis. Aorta: Aortic dilatation noted. There is mild dilatation of the ascending aorta, measuring 41 mm. IAS/Shunts: The atrial septum is grossly normal.  LEFT VENTRICLE PLAX 2D LVIDd:         3.70 cm LVIDs:         2.90 cm LV PW:         1.30 cm LV IVS:        1.30 cm LVOT diam:     2.40 cm LV SV:         86 LV SV Index:   48 LVOT Area:     4.52 cm  RIGHT VENTRICLE          IVC RV Basal diam:  3.80 cm  IVC diam: 1.20 cm RV Mid diam:    2.40 cm TAPSE (M-mode): 2.0 cm LEFT ATRIUM             Index       RIGHT ATRIUM           Index LA diam:        2.80 cm 1.56 cm/m  RA Area:     16.70 cm LA Vol (A2C):   34.2 ml 19.02 ml/m RA Volume:   49.40 ml  27.47 ml/m LA Vol (A4C):   25.7 ml 14.29 ml/m LA Biplane Vol: 29.6 ml  16.46 ml/m  AORTIC VALVE AV Area (Vmax):    2.43 cm AV Area (Vmean):   2.53 cm AV Area (VTI):     2.88 cm AV Vmax:           147.00 cm/s AV Vmean:          93.100 cm/s AV VTI:            0.297 m AV Peak Grad:      8.6 mmHg AV Mean Grad:      4.0 mmHg LVOT Vmax:         78.80 cm/s LVOT Vmean:        52.150 cm/s LVOT VTI:          0.189 m LVOT/AV VTI ratio: 0.64  AORTA Ao Root diam: 3.90 cm Ao Asc diam:  4.10 cm MITRAL VALVE                TRICUSPID VALVE MV Area (PHT): 2.37 cm     TR Peak grad:   12.5 mmHg MV Decel Time: 320 msec     TR Vmax:        177.00 cm/s MV E velocity: 92.70 cm/s MV A velocity: 113.00 cm/s  SHUNTS MV E/A ratio:  0.82         Systemic VTI:  0.19 m                             Systemic Diam: 2.40 cm Mertie Moores MD Electronically signed by Mertie Moores MD Signature Date/Time: 11/27/2020/4:38:03 PM    Final     Assessment/Plan  1. Acute cystitis without hematuria Begin cefpodoxime 100 mg bid x 7 days  2. Diarrhea, unspecified type Check cdiff and lactoferrin as well as stool for blood This has been an issue in the past IBS vs colitis remains in the differential as  well as infection although suspicion is low for cdiff so I have ordered imodium 2 mg q 8 prn and add florastor 1 cap bid If not improving consider questran and/or GI referral   3. Urinary retention  With associate hydronephrosis and BPH Continue Flomax Continue Foley F/u with urology 5/20  Family/ staff Communication: discussed with his daughter Fulton Reek  Labs/tests ordered:  CBC BMP pending

## 2020-12-18 DIAGNOSIS — M6389 Disorders of muscle in diseases classified elsewhere, multiple sites: Secondary | ICD-10-CM | POA: Diagnosis not present

## 2020-12-18 DIAGNOSIS — Z903 Acquired absence of stomach [part of]: Secondary | ICD-10-CM | POA: Diagnosis not present

## 2020-12-18 DIAGNOSIS — R2689 Other abnormalities of gait and mobility: Secondary | ICD-10-CM | POA: Diagnosis not present

## 2020-12-18 DIAGNOSIS — R278 Other lack of coordination: Secondary | ICD-10-CM | POA: Diagnosis not present

## 2020-12-18 DIAGNOSIS — R2681 Unsteadiness on feet: Secondary | ICD-10-CM | POA: Diagnosis not present

## 2020-12-18 DIAGNOSIS — R1084 Generalized abdominal pain: Secondary | ICD-10-CM | POA: Diagnosis not present

## 2020-12-19 ENCOUNTER — Encounter: Payer: Self-pay | Admitting: Gastroenterology

## 2020-12-19 DIAGNOSIS — R1084 Generalized abdominal pain: Secondary | ICD-10-CM | POA: Diagnosis not present

## 2020-12-19 DIAGNOSIS — R2681 Unsteadiness on feet: Secondary | ICD-10-CM | POA: Diagnosis not present

## 2020-12-19 DIAGNOSIS — M6389 Disorders of muscle in diseases classified elsewhere, multiple sites: Secondary | ICD-10-CM | POA: Diagnosis not present

## 2020-12-19 DIAGNOSIS — R278 Other lack of coordination: Secondary | ICD-10-CM | POA: Diagnosis not present

## 2020-12-19 DIAGNOSIS — R2689 Other abnormalities of gait and mobility: Secondary | ICD-10-CM | POA: Diagnosis not present

## 2020-12-19 DIAGNOSIS — Z903 Acquired absence of stomach [part of]: Secondary | ICD-10-CM | POA: Diagnosis not present

## 2020-12-19 DIAGNOSIS — R197 Diarrhea, unspecified: Secondary | ICD-10-CM | POA: Diagnosis not present

## 2020-12-19 LAB — FECAL OCCULT BLOOD, IMMUNOCHEMICAL: IFOBT: NEGATIVE

## 2020-12-20 DIAGNOSIS — Z903 Acquired absence of stomach [part of]: Secondary | ICD-10-CM | POA: Diagnosis not present

## 2020-12-20 DIAGNOSIS — R2689 Other abnormalities of gait and mobility: Secondary | ICD-10-CM | POA: Diagnosis not present

## 2020-12-20 DIAGNOSIS — M6389 Disorders of muscle in diseases classified elsewhere, multiple sites: Secondary | ICD-10-CM | POA: Diagnosis not present

## 2020-12-20 DIAGNOSIS — R1084 Generalized abdominal pain: Secondary | ICD-10-CM | POA: Diagnosis not present

## 2020-12-20 DIAGNOSIS — R2681 Unsteadiness on feet: Secondary | ICD-10-CM | POA: Diagnosis not present

## 2020-12-20 DIAGNOSIS — R278 Other lack of coordination: Secondary | ICD-10-CM | POA: Diagnosis not present

## 2020-12-21 DIAGNOSIS — M6389 Disorders of muscle in diseases classified elsewhere, multiple sites: Secondary | ICD-10-CM | POA: Diagnosis not present

## 2020-12-21 DIAGNOSIS — R278 Other lack of coordination: Secondary | ICD-10-CM | POA: Diagnosis not present

## 2020-12-21 DIAGNOSIS — Z903 Acquired absence of stomach [part of]: Secondary | ICD-10-CM | POA: Diagnosis not present

## 2020-12-21 DIAGNOSIS — R2681 Unsteadiness on feet: Secondary | ICD-10-CM | POA: Diagnosis not present

## 2020-12-21 DIAGNOSIS — R2689 Other abnormalities of gait and mobility: Secondary | ICD-10-CM | POA: Diagnosis not present

## 2020-12-21 DIAGNOSIS — R1084 Generalized abdominal pain: Secondary | ICD-10-CM | POA: Diagnosis not present

## 2020-12-24 DIAGNOSIS — R2681 Unsteadiness on feet: Secondary | ICD-10-CM | POA: Diagnosis not present

## 2020-12-24 DIAGNOSIS — R2689 Other abnormalities of gait and mobility: Secondary | ICD-10-CM | POA: Diagnosis not present

## 2020-12-24 DIAGNOSIS — R278 Other lack of coordination: Secondary | ICD-10-CM | POA: Diagnosis not present

## 2020-12-24 DIAGNOSIS — Z903 Acquired absence of stomach [part of]: Secondary | ICD-10-CM | POA: Diagnosis not present

## 2020-12-24 DIAGNOSIS — R1084 Generalized abdominal pain: Secondary | ICD-10-CM | POA: Diagnosis not present

## 2020-12-25 ENCOUNTER — Encounter: Payer: Self-pay | Admitting: Orthopedic Surgery

## 2020-12-25 ENCOUNTER — Non-Acute Institutional Stay (SKILLED_NURSING_FACILITY): Payer: Medicare Other | Admitting: Orthopedic Surgery

## 2020-12-25 DIAGNOSIS — R42 Dizziness and giddiness: Secondary | ICD-10-CM | POA: Diagnosis not present

## 2020-12-25 DIAGNOSIS — R2681 Unsteadiness on feet: Secondary | ICD-10-CM | POA: Diagnosis not present

## 2020-12-25 DIAGNOSIS — Z903 Acquired absence of stomach [part of]: Secondary | ICD-10-CM | POA: Diagnosis not present

## 2020-12-25 DIAGNOSIS — R1084 Generalized abdominal pain: Secondary | ICD-10-CM | POA: Diagnosis not present

## 2020-12-25 DIAGNOSIS — R2689 Other abnormalities of gait and mobility: Secondary | ICD-10-CM | POA: Diagnosis not present

## 2020-12-25 DIAGNOSIS — R278 Other lack of coordination: Secondary | ICD-10-CM | POA: Diagnosis not present

## 2020-12-25 DIAGNOSIS — M6389 Disorders of muscle in diseases classified elsewhere, multiple sites: Secondary | ICD-10-CM | POA: Diagnosis not present

## 2020-12-25 NOTE — Progress Notes (Signed)
Location:  [facility] Room Number: 156 Place of Service:  SNF [phone]) Provider: Windell Moulding, AGNP-C  Gregory Dad, MD  Patient Care Team: Gregory Dad, MD as PCP - General (Internal Medicine)  Extended Emergency Contact Information Primary Emergency Contact: Rudean Hitt Address: [place], [place] [address] [name] Phone: [phone] Mobile Phone: [phone] Relation: Daughter Secondary Emergency Contact: Simms,Carol Address: [address]          [place], [place] [address] [place] Phone: [phone] Work Phone: [phone] Relation: Other  Code Status: DNR Goals of care: Advanced Directive information Advanced Directives 12/17/2020  Does Patient Have a Medical Advance Directive? Yes  Type of Advance Directive Out of facility DNR (pink MOST or yellow form)  Does patient want to make changes to medical advance directive? No - Patient declined  Copy of [place] in Chart? -  Would patient like information on creating a medical advance directive? -  Pre-existing out of facility DNR order (yellow form or pink MOST form) Pink MOST/Yellow Form most recent copy in chart - Physician notified to receive inpatient order     Chief Complaint  Patient presents with  . Acute Visit    dizziness    HPI:  Pt is a 87 y.o. male seen today for acute visit for dizziness.   Nursing staff reports he is refusing PT due to dizziness. He states symptom began a few days ago. No syncopal events have occurred. Recently hospitalized at [facility] for acute GI. Hgb 8.5, 2 weeks ago. No signs of bleeding at this time.   Orthostatic blood pressures are as follows:  05/10- lying down- 106/60/ HR 57             Sitting down- 102/58/ HR 52                        Standing - 96/56/ HR 49  Recent blood pressures are as follows:  05/09- 121/73, 104/59, 100/64  05/08-  104/63  05/07- 97/57, 109/71, 108/61  Had loose stools in the past week. Initially about 6 times a day, episodes have reduced with daily imodium.   No recent falls or injuries.     Past Medical History:  Diagnosis Date  . Acute kidney injury ([place]) 03/06/14  . Anemia, unspecified 03/06/14  . Arthritis   . BPH (benign prostatic hyperplasia)   . Cerebral atrophy ([place]) 03/06/14  . Cerebrovascular disease 03/06/14  . Degenerative disc disease, lumbar 03/06/14  . Diverticula, bladder acquired   . Edema 03/06/14  . GERD (gastroesophageal reflux disease)   . H/O: GI bleed 1986  . Hiatal hernia   . History of fall 03/06/14  . Hypercalcemia 03/06/14  . Hyperlipidemia   . Hypertension   . Impotence   . LFT elevation 03/06/14  . Lumbago 03/06/14  . Mallory-Weiss tear 1986  . Osteoporosis   . Rhabdomyolysis 03/06/14  . Scoliosis of cervical spine 03/06/14  . Scoliosis of lumbar spine 03/06/14  . Shingles   . Spermatocele    bilateral, left greater than right  . Troponin level elevated 03/06/14  . Weak 03/06/14   Past Surgical History:  Procedure Laterality Date  . BACK SURGERY  10/2009   ESI for surgery for lumbar spinal stenosis  . BIOPSY  11/24/2020   Procedure: BIOPSY;  Surgeon: Owens Loffler  P, MD;  Location: [facility];  Service: Endoscopy;;  . clubbed toe repair  2004  . COLONOSCOPY  08/15/2011  . ESOPHAGOGASTRODUODENOSCOPY (EGD) WITH PROPOFOL N/A 11/24/2020   Procedure: ESOPHAGOGASTRODUODENOSCOPY (EGD) WITH PROPOFOL;  Surgeon: Milus Banister, MD;  Location: [facility];  Service: Endoscopy;  Laterality: N/A;  . HIATAL HERNIA REPAIR N/A 11/28/2020   Procedure: Gastrostomy tube placement;  Surgeon: Ralene Ok, MD;  Location: [facility];  Service: General;  Laterality: N/A;  90 MINUTES  . INGUINAL HERNIA REPAIR Right 11/26/2004   Dr Rebekah Chesterfield - open w mesh  . REPLACEMENT TOTAL KNEE BILATERAL  2007, 2008  . TONSILLECTOMY AND ADENOIDECTOMY  1939  . X-STOP IMPLANTATION      Allergies   Allergen Reactions  . Amrix [Cyclobenzaprine]   . Shellfish Allergy     Outpatient Encounter Medications as of 12/25/2020  Medication Sig  . acetaminophen (TYLENOL) 325 MG tablet Take 650 mg by mouth every 6 (six) hours as needed.  Marland Kitchen acetaminophen (TYLENOL) 500 MG tablet Take 1,000 mg by mouth in the morning, at noon, and at bedtime.  Marland Kitchen antiseptic oral rinse (BIOTENE) LIQD 15 mLs by Mouth Rinse route 4 (four) times daily as needed for dry mouth.  . bisacodyl (DULCOLAX) 10 MG suppository Place 10 mg rectally as needed for moderate constipation.  . lactose free nutrition (BOOST) LIQD Take 237 mLs by mouth 2 (two) times daily between meals.  Marland Kitchen levothyroxine (SYNTHROID) 25 MCG tablet Take 1 tablet (25 mcg total) by mouth daily before breakfast.  . loperamide (IMODIUM) 2 MG capsule Take 2 mg by mouth every 8 (eight) hours as needed for diarrhea or loose stools.  . Melatonin 5 MG CAPS Take 1 capsule (5 mg total) by mouth at bedtime.  . ondansetron (ZOFRAN) 4 MG tablet Take 4 mg by mouth every 6 (six) hours as needed for nausea or vomiting.  . pantoprazole (PROTONIX) 40 MG tablet Take 1 tablet (40 mg total) by mouth 2 (two) times daily for 30 days, THEN 1 tablet (40 mg total) daily.  Vladimir Faster Glycol-Propyl Glycol (SYSTANE) 0.4-0.3 % SOLN Two drops both eyes three times daily as needed for dry itchy eyes.  Marland Kitchen QUEtiapine (SEROQUEL) 25 MG tablet Take 12.5 mg by mouth 2 (two) times daily.  Marland Kitchen saccharomyces boulardii (FLORASTOR) 250 MG capsule Take 250 mg by mouth 2 (two) times daily.  . tamsulosin (FLOMAX) 0.4 MG CAPS capsule Take 1 capsule (0.4 mg total) by mouth daily.  Marland Kitchen triamcinolone cream (KENALOG) 0.1 % Apply 1 application topically as needed.  . vitamin B-12 (CYANOCOBALAMIN) 1000 MCG tablet Take 500 mcg by mouth daily.    No facility-administered encounter medications on file as of 12/25/2020.    Review of Systems  Constitutional: Positive for activity change. Negative for appetite change  and fatigue.  Respiratory: Negative for cough, shortness of breath and wheezing.   Cardiovascular: Negative for chest pain and leg swelling.  Gastrointestinal: Positive for diarrhea. Negative for abdominal distention, abdominal pain and blood in stool.  Genitourinary: Negative for hematuria.  Neurological: Positive for dizziness. Negative for syncope.  Psychiatric/Behavioral: Negative for dysphoric mood. The patient is not nervous/anxious.     Immunization History  Administered Date(s) Administered  . Influenza, High Dose Seasonal PF 06/08/2020  . Moderna Sars-Covid-2 Vaccination 09/09/2019, 09/27/2019  . Td 09/02/2007  . Tdap 11/11/2017   Pertinent  Health Maintenance Due  Topic Date Due  . PNA vac Low Risk Adult (1 of 2 - PCV13) Never done  .  DEXA SCAN  02/28/2021 (Originally 12/30/2014)  . INFLUENZA VACCINE  Discontinued   Fall Risk  02/29/2020 10/19/2019 06/13/2019 03/16/2019 05/12/2018  Falls in the past year? 0 0 0 0 No  Number falls in past yr: 0 0 0 0 -  Injury with Fall? 0 0 0 0 -  Risk for fall due to : - - Impaired balance/gait - -  Follow up - - Falls evaluation completed - -   Functional Status Survey:    Vitals:   12/25/20 1623  BP: (!) 93/58  Pulse: 72  Resp: 18  Temp: 98.1 F (36.7 C)  SpO2: 99%  Weight: 139 lb (63 kg)  Height: 5\' 4"  (1.626 m)   Body mass index is 23.86 kg/m. Physical Exam Vitals reviewed.  Constitutional:      General: He is not in acute distress. HENT:     Head: Normocephalic.  Cardiovascular:     Rate and Rhythm: Bradycardia present.     Pulses: Normal pulses.     Heart sounds: Normal heart sounds. No murmur heard.   Pulmonary:     Effort: Pulmonary effort is normal. No respiratory distress.     Breath sounds: Normal breath sounds. No wheezing.  Abdominal:     General: Bowel sounds are normal. There is no distension.     Palpations: Abdomen is soft.     Tenderness: There is no abdominal tenderness.     Comments: G tube   Genitourinary:    Comments: Indwelling catheter Musculoskeletal:     Right lower leg: No edema.     Left lower leg: No edema.  Skin:    General: Skin is warm and dry.     Capillary Refill: Capillary refill takes less than 2 seconds.  Neurological:     General: No focal deficit present.     Mental Status: He is alert and oriented to person, place, and time.     Motor: Weakness present.     Gait: Gait abnormal.     Comments: walker  Psychiatric:        Mood and Affect: Mood normal.        Behavior: Behavior normal.     Labs reviewed: Recent Labs    11/26/20 0332 11/27/20 0327 11/28/20 0340 11/29/20 0338 12/06/20 0000 12/10/20 0000  NA 134* 134* 132* 136 134* 133*  K 3.9 4.0 3.9 4.3 3.6 3.8  CL 109 106 102 105 100 97*  CO2 19* 21* 20* 22 26* 23*  GLUCOSE 103* 97 98 132*  --   --   BUN 24* 21 17 20 21 19   CREATININE 1.38* 1.29* 1.49* 1.49* 1.2 1.3  CALCIUM 7.7* 7.9* 7.7* 8.1* 7.8* 8.2*  MG 1.7 2.0 1.8 2.0  --   --   PHOS 2.6 3.3 3.0  --   --   --    Recent Labs    11/27/20 0327 11/28/20 0340 11/29/20 0338 12/06/20 0000 12/10/20 0000  AST 16 15 16 15 17   ALT 14 12 12 11 15   ALKPHOS 60 64 68 86 97  BILITOT 0.9 0.8 0.7  --   --   PROT 5.6* 5.7* 6.4*  --   --   ALBUMIN 2.5* 2.6* 2.7* 2.8* 3.0*   Recent Labs    11/26/20 0332 11/27/20 0327 11/28/20 0340 11/29/20 0338 12/06/20 0000 12/10/20 0000  WBC 9.2 8.4 9.2 12.1* 12.2 8.9  NEUTROABS 5.7 5.1 5.8  --   --   --   HGB 8.1*  7.6*  8.4* 8.4* 9.4* 7.9* 8.5*  HCT 24.8* 23.8*  26.1* 26.9* 29.7* 24* 26*  MCV 90.8 91.3 93.4 92.2  --   --   PLT 240 265 284 262 463* 588*   Lab Results  Component Value Date   TSH 3.249 11/26/2020   Lab Results  Component Value Date   HGBA1C 5.6 11/26/2020   No results found for: CHOL, HDL, LDLCALC, LDLDIRECT, TRIG, CHOLHDL  Significant Diagnostic Results in last 30 days:  ECHOCARDIOGRAM COMPLETE  Result Date: 11/27/2020    ECHOCARDIOGRAM REPORT   Patient Name:   [name] Date of Exam: 11/27/2020 Medical Rec #:  [id number]  Height:       68.0 in Accession #:    [id number] Weight:       148.0 lb Date of Birth:  February 01, 1925  BSA:          1.798 m Patient Age:    79 years   BP:           98/68 mmHg Patient Gender: M          HR:           75 bpm. Exam Location:  Inpatient Procedure: 2D Echo, Cardiac Doppler and Color Doppler Indications:    Z01.818 Encounter for other preprocedural examination  History:        Patient has prior history of Echocardiogram examinations, most                 recent 02/27/2014. Risk Factors:Hypertension and Dyslipidemia.                 Scoliosis. GERD.  Sonographer:    Jonelle Sidle Dance Referring Phys: [id number] [place]  1. Left ventricular ejection fraction, by estimation, is 45 to 50%. The left ventricle has mildly decreased function. The left ventricle has no regional wall motion abnormalities. There is mild left ventricular hypertrophy. Left ventricular diastolic parameters are consistent with Grade I diastolic dysfunction (impaired relaxation).  2. Right ventricular systolic function is normal. The right ventricular size is normal. There is normal pulmonary artery systolic pressure.  3. The mitral valve is grossly normal. Mild mitral valve regurgitation.  4. The aortic valve is normal in structure. Aortic valve regurgitation is not visualized. No aortic stenosis is present.  5. Aortic dilatation noted. There is mild dilatation of the ascending aorta, measuring 41 mm. FINDINGS  Left Ventricle: Left ventricular ejection fraction, by estimation, is 45 to 50%. The left ventricle has mildly decreased function. The left ventricle has no regional wall motion abnormalities. The left ventricular internal cavity size was normal in size. There is mild left ventricular hypertrophy. Left ventricular diastolic parameters are consistent with Grade I diastolic dysfunction (impaired relaxation). Right Ventricle: The right ventricular size is normal. Right  vetricular wall thickness was not well visualized. Right ventricular systolic function is normal. There is normal pulmonary artery systolic pressure. The tricuspid regurgitant velocity is 1.77 m/s, and with an assumed right atrial pressure of 3 mmHg, the estimated right ventricular systolic pressure is [id number]. Left Atrium: Left atrial size was normal in size. Right Atrium: Right atrial size was normal in size. Pericardium: There is no evidence of pericardial effusion. Mitral Valve: The mitral valve is grossly normal. Mild mitral valve regurgitation. Tricuspid Valve: The tricuspid valve is grossly normal. Tricuspid valve regurgitation is trivial. Aortic Valve: The aortic valve is normal in structure. There is mild to moderate aortic valve annular calcification. Aortic valve regurgitation is  not visualized. No aortic stenosis is present. Aortic valve mean gradient measures 4.0 mmHg. Aortic valve peak gradient measures 8.6 mmHg. Aortic valve area, by VTI measures 2.88 cm. Pulmonic Valve: The pulmonic valve was normal in structure. Pulmonic valve regurgitation is mild to moderate. No evidence of pulmonic stenosis. Aorta: Aortic dilatation noted. There is mild dilatation of the ascending aorta, measuring 41 mm. IAS/Shunts: The atrial septum is grossly normal.  LEFT VENTRICLE PLAX 2D LVIDd:         3.70 cm LVIDs:         2.90 cm LV PW:         1.30 cm LV IVS:        1.30 cm LVOT diam:     2.40 cm LV SV:         86 LV SV Index:   48 LVOT Area:     4.52 cm  RIGHT VENTRICLE          IVC RV Basal diam:  3.80 cm  IVC diam: 1.20 cm RV Mid diam:    2.40 cm TAPSE (M-mode): 2.0 cm LEFT ATRIUM             Index       RIGHT ATRIUM           Index LA diam:        2.80 cm 1.56 cm/m  RA Area:     16.70 cm LA Vol (A2C):   34.2 ml 19.02 ml/m RA Volume:   49.40 ml  27.47 ml/m LA Vol (A4C):   25.7 ml 14.29 ml/m LA Biplane Vol: 29.6 ml 16.46 ml/m  AORTIC VALVE AV Area (Vmax):    2.43 cm AV Area (Vmean):   2.53 cm AV Area (VTI):      2.88 cm AV Vmax:           147.00 cm/s AV Vmean:          93.100 cm/s AV VTI:            0.297 m AV Peak Grad:      8.6 mmHg AV Mean Grad:      4.0 mmHg LVOT Vmax:         78.80 cm/s LVOT Vmean:        52.150 cm/s LVOT VTI:          0.189 m LVOT/AV VTI ratio: 0.64  AORTA Ao Root diam: 3.90 cm Ao Asc diam:  4.10 cm MITRAL VALVE                TRICUSPID VALVE MV Area (PHT): 2.37 cm     TR Peak grad:   12.5 mmHg MV Decel Time: 320 msec     TR Vmax:        177.00 cm/s MV E velocity: 92.70 cm/s MV A velocity: 113.00 cm/s  SHUNTS MV E/A ratio:  0.82         Systemic VTI:  0.19 m                             Systemic Diam: 2.40 cm Kristeen Miss MD Electronically signed by Kristeen Miss MD Signature Date/Time: 11/27/2020/4:38:03 PM    Final     Assessment/Plan 1. Dizziness - no signs of bleeding, Hgb 8.5- 2 weeks ago - concerned his hemoglobin has dropped, blood pressure with average SBP 100 - will recheck cbc/diff and bmp - cont falls safety precuations  Family/ staff Communication: plan discussed with patient  Labs/tests ordered:  Cbc/diff, bmp

## 2020-12-26 DIAGNOSIS — R42 Dizziness and giddiness: Secondary | ICD-10-CM | POA: Diagnosis not present

## 2020-12-26 DIAGNOSIS — Z903 Acquired absence of stomach [part of]: Secondary | ICD-10-CM | POA: Diagnosis not present

## 2020-12-26 DIAGNOSIS — M6389 Disorders of muscle in diseases classified elsewhere, multiple sites: Secondary | ICD-10-CM | POA: Diagnosis not present

## 2020-12-26 DIAGNOSIS — R278 Other lack of coordination: Secondary | ICD-10-CM | POA: Diagnosis not present

## 2020-12-26 DIAGNOSIS — R1084 Generalized abdominal pain: Secondary | ICD-10-CM | POA: Diagnosis not present

## 2020-12-26 LAB — CBC: RBC: 3.42 — AB (ref 3.87–5.11)

## 2020-12-26 LAB — BASIC METABOLIC PANEL
BUN: 21 (ref 4–21)
CO2: 22 (ref 13–22)
Chloride: 100 (ref 99–108)
Creatinine: 1.4 — AB (ref 0.6–1.3)
Glucose: 89
Potassium: 4.3 (ref 3.4–5.3)
Sodium: 136 — AB (ref 137–147)

## 2020-12-26 LAB — CBC AND DIFFERENTIAL
HCT: 29 — AB (ref 41–53)
Hemoglobin: 9.2 — AB (ref 13.5–17.5)
Platelets: 638 — AB (ref 150–399)
WBC: 11.3

## 2020-12-26 LAB — COMPREHENSIVE METABOLIC PANEL: Calcium: 8.8 (ref 8.7–10.7)

## 2020-12-27 ENCOUNTER — Encounter: Payer: Self-pay | Admitting: Adult Health

## 2020-12-27 ENCOUNTER — Non-Acute Institutional Stay (SKILLED_NURSING_FACILITY): Payer: Medicare Other | Admitting: Adult Health

## 2020-12-27 DIAGNOSIS — E440 Moderate protein-calorie malnutrition: Secondary | ICD-10-CM | POA: Diagnosis not present

## 2020-12-27 DIAGNOSIS — D500 Iron deficiency anemia secondary to blood loss (chronic): Secondary | ICD-10-CM

## 2020-12-27 DIAGNOSIS — K253 Acute gastric ulcer without hemorrhage or perforation: Secondary | ICD-10-CM | POA: Diagnosis not present

## 2020-12-27 DIAGNOSIS — R4189 Other symptoms and signs involving cognitive functions and awareness: Secondary | ICD-10-CM | POA: Diagnosis not present

## 2020-12-27 DIAGNOSIS — M4155 Other secondary scoliosis, thoracolumbar region: Secondary | ICD-10-CM

## 2020-12-27 DIAGNOSIS — K909 Intestinal malabsorption, unspecified: Secondary | ICD-10-CM

## 2020-12-27 DIAGNOSIS — Z903 Acquired absence of stomach [part of]: Secondary | ICD-10-CM | POA: Diagnosis not present

## 2020-12-27 DIAGNOSIS — R2681 Unsteadiness on feet: Secondary | ICD-10-CM | POA: Diagnosis not present

## 2020-12-27 DIAGNOSIS — M4185 Other forms of scoliosis, thoracolumbar region: Secondary | ICD-10-CM

## 2020-12-27 DIAGNOSIS — R339 Retention of urine, unspecified: Secondary | ICD-10-CM | POA: Diagnosis not present

## 2020-12-27 DIAGNOSIS — R1084 Generalized abdominal pain: Secondary | ICD-10-CM | POA: Diagnosis not present

## 2020-12-27 DIAGNOSIS — R2689 Other abnormalities of gait and mobility: Secondary | ICD-10-CM | POA: Diagnosis not present

## 2020-12-27 DIAGNOSIS — R197 Diarrhea, unspecified: Secondary | ICD-10-CM

## 2020-12-27 DIAGNOSIS — R278 Other lack of coordination: Secondary | ICD-10-CM | POA: Diagnosis not present

## 2020-12-27 DIAGNOSIS — M6389 Disorders of muscle in diseases classified elsewhere, multiple sites: Secondary | ICD-10-CM | POA: Diagnosis not present

## 2020-12-27 MED ORDER — CHOLESTYRAMINE 4 G PO PACK: 4.0000 g | PACK | Freq: Two times a day (BID) | ORAL | 12 refills | Status: AC

## 2020-12-27 NOTE — Progress Notes (Signed)
Location:  [facility] of Service:  SNF (31) Provider:   Cindi Carbon, [place] [phone]  [name], MD  Patient Care Team: [name], MD as PCP - General (Internal Medicine)  Extended Emergency Contact Information Primary Emergency Contact: Rudean Hitt Address: [place], [place] [address] [name] Phone: [phone] Mobile Phone: [phone] Relation: Daughter Secondary Emergency Contact: Simms,Carol Address: [address]          [place], [place] [address] [place] Phone: [phone] Work Phone: [phone] Relation: Other  Code Status:  DNR Goals of care: Advanced Directive information Advanced Directives 12/17/2020  Does Patient Have a Medical Advance Directive? Yes  Type of Advance Directive Out of facility DNR (pink MOST or yellow form)  Does patient want to make changes to medical advance directive? No - Patient declined  Copy of Icard in Chart? -  Would patient like information on creating a medical advance directive? -  Pre-existing out of facility DNR order (yellow form or pink MOST form) Pink MOST/Yellow Form most recent copy in chart - Physician notified to receive inpatient order     Chief Complaint  Patient presents with  . Acute Visit    Loose stools    HPI:  Pt is a 87 y.o. male seen today for an acute visit for loose stools. He is s/p hospitalization for acute GIB with cameron ulcer and large hiatal hernia, s/p Gastric reduction and gastropexy by Dr. Rosendo Gros on 11/28/20. He also has a foley cath in place due to urinary retention and bilateral hydronephrosis.  12/19/20: stool negative for blood, lactoferrin, and cdiff Started on florastor for IBS related symptoms and takes imodium. Continues with periodic watery stools and "blow outs". Denies any abd pain or nausea. No obvious blood. CBC was  ordered on 5/10 due to feelings of dizziness Hgb had improved to 9.1 from 8.5. BMP was WNL. Labs need to abstracted into epic. Feelings of dizziness have improved.   12/15/20: Urine grew 100,000 Colonies of Ecoli, sensitive to rocephin. Treated with cefpodoxime x 7 days. Denies any fever or abd pain. Urine out put 200-475cc per shift.    Weight is the same. Ordered boost but refuses sometimes.  Wt Readings from Last 3 Encounters:  12/27/20 139 lb (63 kg)  12/25/20 139 lb (63 kg)  12/17/20 139 lb 6.4 oz (63.2 kg)    Past Medical History:  Diagnosis Date  . Acute kidney injury ([facility]) 03/06/14  . Anemia, unspecified 03/06/14  . Arthritis   . BPH (benign prostatic hyperplasia)   . Cerebral atrophy ([facility]) 03/06/14  . Cerebrovascular disease 03/06/14  . Degenerative disc disease, lumbar 03/06/14  . Diverticula, bladder acquired   . Edema 03/06/14  . GERD (gastroesophageal reflux disease)   . H/O: GI bleed 1986  . Hiatal hernia   . History of fall 03/06/14  . Hypercalcemia 03/06/14  . Hyperlipidemia   . Hypertension   . Impotence   . LFT elevation 03/06/14  . Lumbago 03/06/14  . Mallory-Weiss tear 1986  . Osteoporosis   . Rhabdomyolysis 03/06/14  . Scoliosis of cervical spine 03/06/14  . Scoliosis of lumbar spine 03/06/14  . Shingles   . Spermatocele    bilateral, left greater than right  . Troponin level elevated 03/06/14  . Weak 03/06/14   Past Surgical History:  Procedure  Laterality Date  . BACK SURGERY  10/2009   ESI for surgery for lumbar spinal stenosis  . BIOPSY  11/24/2020   Procedure: BIOPSY;  Surgeon: Milus Banister, MD;  Location: [facility];  Service: Endoscopy;;  . clubbed toe repair  2004  . COLONOSCOPY  08/15/2011  . ESOPHAGOGASTRODUODENOSCOPY (EGD) WITH PROPOFOL N/A 11/24/2020   Procedure: ESOPHAGOGASTRODUODENOSCOPY (EGD) WITH PROPOFOL;  Surgeon: Milus Banister, MD;  Location: [facility];  Service: Endoscopy;  Laterality: N/A;  . HIATAL HERNIA REPAIR N/A 11/28/2020    Procedure: Gastrostomy tube placement;  Surgeon: Ralene Ok, MD;  Location: [facility];  Service: General;  Laterality: N/A;  90 MINUTES  . INGUINAL HERNIA REPAIR Right 11/26/2004   Dr Rebekah Chesterfield - open w mesh  . REPLACEMENT TOTAL KNEE BILATERAL  2007, 2008  . TONSILLECTOMY AND ADENOIDECTOMY  1939  . X-STOP IMPLANTATION      Allergies  Allergen Reactions  . Amrix [Cyclobenzaprine]   . Shellfish Allergy     Outpatient Encounter Medications as of 12/27/2020  Medication Sig  . acetaminophen (TYLENOL) 325 MG tablet Take 650 mg by mouth every 6 (six) hours as needed.  Marland Kitchen acetaminophen (TYLENOL) 500 MG tablet Take 1,000 mg by mouth in the morning, at noon, and at bedtime.  Marland Kitchen antiseptic oral rinse (BIOTENE) LIQD 15 mLs by Mouth Rinse route 4 (four) times daily as needed for dry mouth.  . bisacodyl (DULCOLAX) 10 MG suppository Place 10 mg rectally as needed for moderate constipation.  . lactose free nutrition (BOOST) LIQD Take 237 mLs by mouth 2 (two) times daily between meals.  Marland Kitchen levothyroxine (SYNTHROID) 25 MCG tablet Take 1 tablet (25 mcg total) by mouth daily before breakfast.  . loperamide (IMODIUM) 2 MG capsule Take 2 mg by mouth every 8 (eight) hours as needed for diarrhea or loose stools.  . Melatonin 5 MG CAPS Take 1 capsule (5 mg total) by mouth at bedtime.  . ondansetron (ZOFRAN) 4 MG tablet Take 4 mg by mouth every 6 (six) hours as needed for nausea or vomiting.  . pantoprazole (PROTONIX) 40 MG tablet Take 1 tablet (40 mg total) by mouth 2 (two) times daily for 30 days, THEN 1 tablet (40 mg total) daily.  Vladimir Faster Glycol-Propyl Glycol (SYSTANE) 0.4-0.3 % SOLN Two drops both eyes three times daily as needed for dry itchy eyes.  Marland Kitchen QUEtiapine (SEROQUEL) 25 MG tablet Take 25 mg by mouth 2 (two) times daily.  Marland Kitchen saccharomyces boulardii (FLORASTOR) 250 MG capsule Take 250 mg by mouth 2 (two) times daily.  . tamsulosin (FLOMAX) 0.4 MG CAPS capsule Take 1 capsule (0.4 mg total) by mouth daily.   Marland Kitchen triamcinolone cream (KENALOG) 0.1 % Apply 1 application topically as needed.  . vitamin B-12 (CYANOCOBALAMIN) 1000 MCG tablet Take 500 mcg by mouth daily.    No facility-administered encounter medications on file as of 12/27/2020.    Review of Systems  Constitutional: Negative for activity change, appetite change, chills, diaphoresis, fatigue, fever and unexpected weight change.  Respiratory: Negative for cough, shortness of breath, wheezing and stridor.   Cardiovascular: Positive for leg swelling. Negative for chest pain and palpitations.  Gastrointestinal: Positive for diarrhea. Negative for abdominal distention, abdominal pain, anal bleeding, blood in stool, constipation, nausea, rectal pain and vomiting.  Genitourinary: Negative for difficulty urinating and dysuria.  Musculoskeletal: Positive for back pain and gait problem. Negative for arthralgias, joint swelling and myalgias.  Skin: Positive for wound (buttocks).  Neurological: Negative for dizziness, seizures, syncope, facial  asymmetry, speech difficulty, weakness and headaches.  Hematological: Negative for adenopathy. Does not bruise/bleed easily.  Psychiatric/Behavioral: Positive for confusion. Negative for agitation and behavioral problems.    Immunization History  Administered Date(s) Administered  . Influenza, High Dose Seasonal PF 06/08/2020  . Moderna Sars-Covid-2 Vaccination 09/09/2019, 09/27/2019  . Td 09/02/2007  . Tdap 11/11/2017   Pertinent  Health Maintenance Due  Topic Date Due  . PNA vac Low Risk Adult (1 of 2 - PCV13) Never done  . DEXA SCAN  02/28/2021 (Originally 12/30/2014)  . INFLUENZA VACCINE  Discontinued   Fall Risk  02/29/2020 10/19/2019 06/13/2019 03/16/2019 05/12/2018  Falls in the past year? 0 0 0 0 No  Number falls in past yr: 0 0 0 0 -  Injury with Fall? 0 0 0 0 -  Risk for fall due to : - - Impaired balance/gait - -  Follow up - - Falls evaluation completed - -   Functional Status Survey:     Vitals:   12/27/20 1111  BP: 118/64  Pulse: 67  Resp: 18  Temp: 98.8 F (37.1 C)  SpO2: 98%  Weight: 139 lb (63 kg)   Body mass index is 23.86 kg/m. Physical Exam Vitals and nursing note reviewed.  Constitutional:      General: He is not in acute distress.    Appearance: He is not diaphoretic.  HENT:     Head: Normocephalic and atraumatic.     Mouth/Throat:     Mouth: Mucous membranes are moist.     Pharynx: Oropharynx is clear.  Eyes:     Conjunctiva/sclera: Conjunctivae normal.     Pupils: Pupils are equal, round, and reactive to light.  Neck:     Thyroid: No thyromegaly.     Vascular: No JVD.     Trachea: No tracheal deviation.  Cardiovascular:     Rate and Rhythm: Normal rate and regular rhythm.     Heart sounds: No murmur heard.   Pulmonary:     Effort: Pulmonary effort is normal. No respiratory distress.     Breath sounds: Normal breath sounds. No wheezing.  Abdominal:     General: Bowel sounds are normal. There is no distension.     Palpations: Abdomen is soft.     Tenderness: There is no abdominal tenderness.  Musculoskeletal:     Right lower leg: Edema (+1) present.     Left lower leg: Edema (+1) present.     Comments: kyphoscoliosis  Lymphadenopathy:     Cervical: No cervical adenopathy.  Skin:    General: Skin is warm and dry.     Coloration: Skin is pale.  Neurological:     General: No focal deficit present.     Mental Status: He is alert and oriented to person, place, and time. Mental status is at baseline.  Psychiatric:        Mood and Affect: Mood normal.     Labs reviewed: Recent Labs    11/26/20 0332 11/27/20 0327 11/28/20 0340 11/29/20 0338 12/06/20 0000 12/10/20 0000  NA 134* 134* 132* 136 134* 133*  K 3.9 4.0 3.9 4.3 3.6 3.8  CL 109 106 102 105 100 97*  CO2 19* 21* 20* 22 26* 23*  GLUCOSE 103* 97 98 132*  --   --   BUN 24* 21 17 20 21 19   CREATININE 1.38* 1.29* 1.49* 1.49* 1.2 1.3  CALCIUM 7.7* 7.9* 7.7* 8.1* 7.8* 8.2*   MG 1.7 2.0 1.8 2.0  --   --  PHOS 2.6 3.3 3.0  --   --   --    Recent Labs    11/27/20 0327 11/28/20 0340 11/29/20 0338 12/06/20 0000 12/10/20 0000  AST 16 15 16 15 17   ALT 14 12 12 11 15   ALKPHOS 60 64 68 86 97  BILITOT 0.9 0.8 0.7  --   --   PROT 5.6* 5.7* 6.4*  --   --   ALBUMIN 2.5* 2.6* 2.7* 2.8* 3.0*   Recent Labs    11/26/20 0332 11/27/20 0327 11/28/20 0340 11/29/20 0338 12/06/20 0000 12/10/20 0000  WBC 9.2 8.4 9.2 12.1* 12.2 8.9  NEUTROABS 5.7 5.1 5.8  --   --   --   HGB 8.1* 7.6*  8.4* 8.4* 9.4* 7.9* 8.5*  HCT 24.8* 23.8*  26.1* 26.9* 29.7* 24* 26*  MCV 90.8 91.3 93.4 92.2  --   --   PLT 240 265 284 262 463* 588*   Lab Results  Component Value Date   TSH 3.249 11/26/2020   Lab Results  Component Value Date   HGBA1C 5.6 11/26/2020   No results found for: CHOL, HDL, LDLCALC, LDLDIRECT, TRIG, CHOLHDL  Significant Diagnostic Results in last 30 days:  ECHOCARDIOGRAM COMPLETE  Result Date: 11/27/2020    ECHOCARDIOGRAM REPORT   Patient Name:   [name] Date of Exam: 11/27/2020 Medical Rec #:  [id number]  Height:       68.0 in Accession #:    [id number] Weight:       148.0 lb Date of Birth:  19-Aug-1924  BSA:          1.798 m Patient Age:    13 years   BP:           98/68 mmHg Patient Gender: M          HR:           75 bpm. Exam Location:  Inpatient Procedure: 2D Echo, Cardiac Doppler and Color Doppler Indications:    Z01.818 Encounter for other preprocedural examination  History:        Patient has prior history of Echocardiogram examinations, most                 recent 02/27/2014. Risk Factors:Hypertension and Dyslipidemia.                 Scoliosis. GERD.  Sonographer:    Jonelle Sidle Dance Referring Phys: [id number] [place]  1. Left ventricular ejection fraction, by estimation, is 45 to 50%. The left ventricle has mildly decreased function. The left ventricle has no regional wall motion abnormalities. There is mild left ventricular hypertrophy. Left  ventricular diastolic parameters are consistent with Grade I diastolic dysfunction (impaired relaxation).  2. Right ventricular systolic function is normal. The right ventricular size is normal. There is normal pulmonary artery systolic pressure.  3. The mitral valve is grossly normal. Mild mitral valve regurgitation.  4. The aortic valve is normal in structure. Aortic valve regurgitation is not visualized. No aortic stenosis is present.  5. Aortic dilatation noted. There is mild dilatation of the ascending aorta, measuring 41 mm. FINDINGS  Left Ventricle: Left ventricular ejection fraction, by estimation, is 45 to 50%. The left ventricle has mildly decreased function. The left ventricle has no regional wall motion abnormalities. The left ventricular internal cavity size was normal in size. There is mild left ventricular hypertrophy. Left ventricular diastolic parameters are consistent with Grade I diastolic dysfunction (impaired relaxation). Right Ventricle: The right ventricular  size is normal. Right vetricular wall thickness was not well visualized. Right ventricular systolic function is normal. There is normal pulmonary artery systolic pressure. The tricuspid regurgitant velocity is 1.77 m/s, and with an assumed right atrial pressure of 3 mmHg, the estimated right ventricular systolic pressure is [id number]. Left Atrium: Left atrial size was normal in size. Right Atrium: Right atrial size was normal in size. Pericardium: There is no evidence of pericardial effusion. Mitral Valve: The mitral valve is grossly normal. Mild mitral valve regurgitation. Tricuspid Valve: The tricuspid valve is grossly normal. Tricuspid valve regurgitation is trivial. Aortic Valve: The aortic valve is normal in structure. There is mild to moderate aortic valve annular calcification. Aortic valve regurgitation is not visualized. No aortic stenosis is present. Aortic valve mean gradient measures 4.0 mmHg. Aortic valve peak gradient  measures 8.6 mmHg. Aortic valve area, by VTI measures 2.88 cm. Pulmonic Valve: The pulmonic valve was normal in structure. Pulmonic valve regurgitation is mild to moderate. No evidence of pulmonic stenosis. Aorta: Aortic dilatation noted. There is mild dilatation of the ascending aorta, measuring 41 mm. IAS/Shunts: The atrial septum is grossly normal.  LEFT VENTRICLE PLAX 2D LVIDd:         3.70 cm LVIDs:         2.90 cm LV PW:         1.30 cm LV IVS:        1.30 cm LVOT diam:     2.40 cm LV SV:         86 LV SV Index:   48 LVOT Area:     4.52 cm  RIGHT VENTRICLE          IVC RV Basal diam:  3.80 cm  IVC diam: 1.20 cm RV Mid diam:    2.40 cm TAPSE (M-mode): 2.0 cm LEFT ATRIUM             Index       RIGHT ATRIUM           Index LA diam:        2.80 cm 1.56 cm/m  RA Area:     16.70 cm LA Vol (A2C):   34.2 ml 19.02 ml/m RA Volume:   49.40 ml  27.47 ml/m LA Vol (A4C):   25.7 ml 14.29 ml/m LA Biplane Vol: 29.6 ml 16.46 ml/m  AORTIC VALVE AV Area (Vmax):    2.43 cm AV Area (Vmean):   2.53 cm AV Area (VTI):     2.88 cm AV Vmax:           147.00 cm/s AV Vmean:          93.100 cm/s AV VTI:            0.297 m AV Peak Grad:      8.6 mmHg AV Mean Grad:      4.0 mmHg LVOT Vmax:         78.80 cm/s LVOT Vmean:        52.150 cm/s LVOT VTI:          0.189 m LVOT/AV VTI ratio: 0.64  AORTA Ao Root diam: 3.90 cm Ao Asc diam:  4.10 cm MITRAL VALVE                TRICUSPID VALVE MV Area (PHT): 2.37 cm     TR Peak grad:   12.5 mmHg MV Decel Time: 320 msec     TR Vmax:  177.00 cm/s MV E velocity: 92.70 cm/s MV A velocity: 113.00 cm/s  SHUNTS MV E/A ratio:  0.82         Systemic VTI:  0.19 m                             Systemic Diam: 2.40 cm Mertie Moores MD Electronically signed by Mertie Moores MD Signature Date/Time: 11/27/2020/4:38:03 PM    Final     Assessment/Plan  1. Diarrhea due to malabsorption Add Questran 1 packet bid  Check for cdiff one more time.   2. Iron deficiency anemia due to chronic blood  loss Not able to tolerate iron Hgb improving   3. Cameron ulcer, acute with bleeding & anemia Continue Protonix bid until 5/15 then reduce to daily   4. Scoliosis of thoracolumbar region due to degenerative disease of spine in adult Has chronic pain and uses a walker Will be moving to skilled care soon   5. Protein-calorie malnutrition, moderate ([facility]) Continue to monitor weight and offer boost as the pt has a wound to his buttocks due to excoriation   6. Urinary retention Will follow up with urology on 5/20.  7. Cognitive impairment Remains pleasant and able to communicate but forgetful of the details of his care and needs support/cuing.

## 2020-12-28 DIAGNOSIS — Z903 Acquired absence of stomach [part of]: Secondary | ICD-10-CM | POA: Diagnosis not present

## 2020-12-28 DIAGNOSIS — R2689 Other abnormalities of gait and mobility: Secondary | ICD-10-CM | POA: Diagnosis not present

## 2020-12-28 DIAGNOSIS — R1084 Generalized abdominal pain: Secondary | ICD-10-CM | POA: Diagnosis not present

## 2020-12-28 DIAGNOSIS — R278 Other lack of coordination: Secondary | ICD-10-CM | POA: Diagnosis not present

## 2020-12-28 DIAGNOSIS — M6389 Disorders of muscle in diseases classified elsewhere, multiple sites: Secondary | ICD-10-CM | POA: Diagnosis not present

## 2020-12-28 DIAGNOSIS — R2681 Unsteadiness on feet: Secondary | ICD-10-CM | POA: Diagnosis not present

## 2020-12-31 DIAGNOSIS — R197 Diarrhea, unspecified: Secondary | ICD-10-CM | POA: Diagnosis not present

## 2020-12-31 DIAGNOSIS — M6389 Disorders of muscle in diseases classified elsewhere, multiple sites: Secondary | ICD-10-CM | POA: Diagnosis not present

## 2020-12-31 DIAGNOSIS — R2681 Unsteadiness on feet: Secondary | ICD-10-CM | POA: Diagnosis not present

## 2020-12-31 DIAGNOSIS — R278 Other lack of coordination: Secondary | ICD-10-CM | POA: Diagnosis not present

## 2020-12-31 DIAGNOSIS — R2689 Other abnormalities of gait and mobility: Secondary | ICD-10-CM | POA: Diagnosis not present

## 2020-12-31 DIAGNOSIS — R1084 Generalized abdominal pain: Secondary | ICD-10-CM | POA: Diagnosis not present

## 2020-12-31 DIAGNOSIS — Z903 Acquired absence of stomach [part of]: Secondary | ICD-10-CM | POA: Diagnosis not present

## 2021-01-01 DIAGNOSIS — R1084 Generalized abdominal pain: Secondary | ICD-10-CM | POA: Diagnosis not present

## 2021-01-01 DIAGNOSIS — M6389 Disorders of muscle in diseases classified elsewhere, multiple sites: Secondary | ICD-10-CM | POA: Diagnosis not present

## 2021-01-01 DIAGNOSIS — Z903 Acquired absence of stomach [part of]: Secondary | ICD-10-CM | POA: Diagnosis not present

## 2021-01-01 DIAGNOSIS — R278 Other lack of coordination: Secondary | ICD-10-CM | POA: Diagnosis not present

## 2021-01-02 DIAGNOSIS — R2689 Other abnormalities of gait and mobility: Secondary | ICD-10-CM | POA: Diagnosis not present

## 2021-01-02 DIAGNOSIS — R278 Other lack of coordination: Secondary | ICD-10-CM | POA: Diagnosis not present

## 2021-01-02 DIAGNOSIS — R1084 Generalized abdominal pain: Secondary | ICD-10-CM | POA: Diagnosis not present

## 2021-01-02 DIAGNOSIS — Z903 Acquired absence of stomach [part of]: Secondary | ICD-10-CM | POA: Diagnosis not present

## 2021-01-02 DIAGNOSIS — M6389 Disorders of muscle in diseases classified elsewhere, multiple sites: Secondary | ICD-10-CM | POA: Diagnosis not present

## 2021-01-02 DIAGNOSIS — R2681 Unsteadiness on feet: Secondary | ICD-10-CM | POA: Diagnosis not present

## 2021-01-03 DIAGNOSIS — R1084 Generalized abdominal pain: Secondary | ICD-10-CM | POA: Diagnosis not present

## 2021-01-03 DIAGNOSIS — R2681 Unsteadiness on feet: Secondary | ICD-10-CM | POA: Diagnosis not present

## 2021-01-03 DIAGNOSIS — Z903 Acquired absence of stomach [part of]: Secondary | ICD-10-CM | POA: Diagnosis not present

## 2021-01-03 DIAGNOSIS — R278 Other lack of coordination: Secondary | ICD-10-CM | POA: Diagnosis not present

## 2021-01-03 DIAGNOSIS — R2689 Other abnormalities of gait and mobility: Secondary | ICD-10-CM | POA: Diagnosis not present

## 2021-01-03 DIAGNOSIS — M6389 Disorders of muscle in diseases classified elsewhere, multiple sites: Secondary | ICD-10-CM | POA: Diagnosis not present

## 2021-01-03 DIAGNOSIS — D508 Other iron deficiency anemias: Secondary | ICD-10-CM | POA: Diagnosis not present

## 2021-01-03 LAB — CBC AND DIFFERENTIAL
HCT: 24 — AB (ref 41–53)
Hemoglobin: 8 — AB (ref 13.5–17.5)
Platelets: 438 — AB (ref 150–399)
WBC: 5.3

## 2021-01-03 LAB — CBC: RBC: 2.85 — AB (ref 3.87–5.11)

## 2021-01-04 DIAGNOSIS — M6389 Disorders of muscle in diseases classified elsewhere, multiple sites: Secondary | ICD-10-CM | POA: Diagnosis not present

## 2021-01-04 DIAGNOSIS — R1084 Generalized abdominal pain: Secondary | ICD-10-CM | POA: Diagnosis not present

## 2021-01-04 DIAGNOSIS — R338 Other retention of urine: Secondary | ICD-10-CM | POA: Diagnosis not present

## 2021-01-04 DIAGNOSIS — Z903 Acquired absence of stomach [part of]: Secondary | ICD-10-CM | POA: Diagnosis not present

## 2021-01-04 DIAGNOSIS — R278 Other lack of coordination: Secondary | ICD-10-CM | POA: Diagnosis not present

## 2021-01-04 DIAGNOSIS — N1330 Unspecified hydronephrosis: Secondary | ICD-10-CM | POA: Diagnosis not present

## 2021-01-07 DIAGNOSIS — R2689 Other abnormalities of gait and mobility: Secondary | ICD-10-CM | POA: Diagnosis not present

## 2021-01-07 DIAGNOSIS — Z903 Acquired absence of stomach [part of]: Secondary | ICD-10-CM | POA: Diagnosis not present

## 2021-01-07 DIAGNOSIS — R2681 Unsteadiness on feet: Secondary | ICD-10-CM | POA: Diagnosis not present

## 2021-01-07 DIAGNOSIS — R278 Other lack of coordination: Secondary | ICD-10-CM | POA: Diagnosis not present

## 2021-01-07 DIAGNOSIS — M6389 Disorders of muscle in diseases classified elsewhere, multiple sites: Secondary | ICD-10-CM | POA: Diagnosis not present

## 2021-01-07 DIAGNOSIS — R1084 Generalized abdominal pain: Secondary | ICD-10-CM | POA: Diagnosis not present

## 2021-01-08 DIAGNOSIS — D519 Vitamin B12 deficiency anemia, unspecified: Secondary | ICD-10-CM | POA: Diagnosis not present

## 2021-01-08 DIAGNOSIS — M6389 Disorders of muscle in diseases classified elsewhere, multiple sites: Secondary | ICD-10-CM | POA: Diagnosis not present

## 2021-01-08 DIAGNOSIS — Z903 Acquired absence of stomach [part of]: Secondary | ICD-10-CM | POA: Diagnosis not present

## 2021-01-08 DIAGNOSIS — R1084 Generalized abdominal pain: Secondary | ICD-10-CM | POA: Diagnosis not present

## 2021-01-08 DIAGNOSIS — R2689 Other abnormalities of gait and mobility: Secondary | ICD-10-CM | POA: Diagnosis not present

## 2021-01-08 DIAGNOSIS — R278 Other lack of coordination: Secondary | ICD-10-CM | POA: Diagnosis not present

## 2021-01-08 DIAGNOSIS — D518 Other vitamin B12 deficiency anemias: Secondary | ICD-10-CM | POA: Diagnosis not present

## 2021-01-08 DIAGNOSIS — R2681 Unsteadiness on feet: Secondary | ICD-10-CM | POA: Diagnosis not present

## 2021-01-08 DIAGNOSIS — D649 Anemia, unspecified: Secondary | ICD-10-CM | POA: Diagnosis not present

## 2021-01-09 DIAGNOSIS — Z903 Acquired absence of stomach [part of]: Secondary | ICD-10-CM | POA: Diagnosis not present

## 2021-01-09 DIAGNOSIS — R1084 Generalized abdominal pain: Secondary | ICD-10-CM | POA: Diagnosis not present

## 2021-01-09 DIAGNOSIS — R2689 Other abnormalities of gait and mobility: Secondary | ICD-10-CM | POA: Diagnosis not present

## 2021-01-09 DIAGNOSIS — R278 Other lack of coordination: Secondary | ICD-10-CM | POA: Diagnosis not present

## 2021-01-09 DIAGNOSIS — M6389 Disorders of muscle in diseases classified elsewhere, multiple sites: Secondary | ICD-10-CM | POA: Diagnosis not present

## 2021-01-09 DIAGNOSIS — R2681 Unsteadiness on feet: Secondary | ICD-10-CM | POA: Diagnosis not present

## 2021-01-10 DIAGNOSIS — R1084 Generalized abdominal pain: Secondary | ICD-10-CM | POA: Diagnosis not present

## 2021-01-10 DIAGNOSIS — Z903 Acquired absence of stomach [part of]: Secondary | ICD-10-CM | POA: Diagnosis not present

## 2021-01-10 DIAGNOSIS — M6389 Disorders of muscle in diseases classified elsewhere, multiple sites: Secondary | ICD-10-CM | POA: Diagnosis not present

## 2021-01-10 DIAGNOSIS — R2689 Other abnormalities of gait and mobility: Secondary | ICD-10-CM | POA: Diagnosis not present

## 2021-01-10 DIAGNOSIS — R278 Other lack of coordination: Secondary | ICD-10-CM | POA: Diagnosis not present

## 2021-01-10 DIAGNOSIS — R2681 Unsteadiness on feet: Secondary | ICD-10-CM | POA: Diagnosis not present

## 2021-01-11 DIAGNOSIS — Z903 Acquired absence of stomach [part of]: Secondary | ICD-10-CM | POA: Diagnosis not present

## 2021-01-11 DIAGNOSIS — R2681 Unsteadiness on feet: Secondary | ICD-10-CM | POA: Diagnosis not present

## 2021-01-11 DIAGNOSIS — R278 Other lack of coordination: Secondary | ICD-10-CM | POA: Diagnosis not present

## 2021-01-11 DIAGNOSIS — R1084 Generalized abdominal pain: Secondary | ICD-10-CM | POA: Diagnosis not present

## 2021-01-11 DIAGNOSIS — R2689 Other abnormalities of gait and mobility: Secondary | ICD-10-CM | POA: Diagnosis not present

## 2021-01-14 DIAGNOSIS — D519 Vitamin B12 deficiency anemia, unspecified: Secondary | ICD-10-CM | POA: Diagnosis not present

## 2021-01-14 DIAGNOSIS — D518 Other vitamin B12 deficiency anemias: Secondary | ICD-10-CM | POA: Diagnosis not present

## 2021-01-14 DIAGNOSIS — D649 Anemia, unspecified: Secondary | ICD-10-CM | POA: Diagnosis not present

## 2021-01-14 LAB — CBC AND DIFFERENTIAL
HCT: 27 — AB (ref 41–53)
Hemoglobin: 8.7 — AB (ref 13.5–17.5)
Platelets: 443 — AB (ref 150–399)
WBC: 9.1

## 2021-01-14 LAB — IRON,TIBC AND FERRITIN PANEL
%SAT: 7.76
Iron: 20
TIBC: 254
UIBC: 234

## 2021-01-14 LAB — CBC: RBC: 3.33 — AB (ref 3.87–5.11)

## 2021-01-14 LAB — VITAMIN B12: Vitamin B-12: 2000

## 2021-01-15 DIAGNOSIS — R2681 Unsteadiness on feet: Secondary | ICD-10-CM | POA: Diagnosis not present

## 2021-01-15 DIAGNOSIS — R278 Other lack of coordination: Secondary | ICD-10-CM | POA: Diagnosis not present

## 2021-01-15 DIAGNOSIS — M6389 Disorders of muscle in diseases classified elsewhere, multiple sites: Secondary | ICD-10-CM | POA: Diagnosis not present

## 2021-01-15 DIAGNOSIS — R1084 Generalized abdominal pain: Secondary | ICD-10-CM | POA: Diagnosis not present

## 2021-01-15 DIAGNOSIS — R2689 Other abnormalities of gait and mobility: Secondary | ICD-10-CM | POA: Diagnosis not present

## 2021-01-15 DIAGNOSIS — Z903 Acquired absence of stomach [part of]: Secondary | ICD-10-CM | POA: Diagnosis not present

## 2021-01-23 DIAGNOSIS — D0359 Melanoma in situ of other part of trunk: Secondary | ICD-10-CM | POA: Diagnosis not present

## 2021-01-23 DIAGNOSIS — L821 Other seborrheic keratosis: Secondary | ICD-10-CM | POA: Diagnosis not present

## 2021-01-23 DIAGNOSIS — L905 Scar conditions and fibrosis of skin: Secondary | ICD-10-CM | POA: Diagnosis not present

## 2021-01-23 DIAGNOSIS — D692 Other nonthrombocytopenic purpura: Secondary | ICD-10-CM | POA: Diagnosis not present

## 2021-01-23 DIAGNOSIS — L814 Other melanin hyperpigmentation: Secondary | ICD-10-CM | POA: Diagnosis not present

## 2021-01-23 DIAGNOSIS — L570 Actinic keratosis: Secondary | ICD-10-CM | POA: Diagnosis not present

## 2021-01-23 DIAGNOSIS — D485 Neoplasm of uncertain behavior of skin: Secondary | ICD-10-CM | POA: Diagnosis not present

## 2021-01-23 DIAGNOSIS — D229 Melanocytic nevi, unspecified: Secondary | ICD-10-CM | POA: Diagnosis not present

## 2021-01-23 DIAGNOSIS — D1801 Hemangioma of skin and subcutaneous tissue: Secondary | ICD-10-CM | POA: Diagnosis not present

## 2021-01-29 ENCOUNTER — Non-Acute Institutional Stay (SKILLED_NURSING_FACILITY): Payer: Medicare Other | Admitting: Orthopedic Surgery

## 2021-01-29 ENCOUNTER — Encounter: Payer: Self-pay | Admitting: Orthopedic Surgery

## 2021-01-29 DIAGNOSIS — K253 Acute gastric ulcer without hemorrhage or perforation: Secondary | ICD-10-CM

## 2021-01-29 DIAGNOSIS — N1832 Chronic kidney disease, stage 3b: Secondary | ICD-10-CM | POA: Diagnosis not present

## 2021-01-29 DIAGNOSIS — Z66 Do not resuscitate: Secondary | ICD-10-CM

## 2021-01-29 DIAGNOSIS — D500 Iron deficiency anemia secondary to blood loss (chronic): Secondary | ICD-10-CM | POA: Diagnosis not present

## 2021-01-29 DIAGNOSIS — E440 Moderate protein-calorie malnutrition: Secondary | ICD-10-CM | POA: Diagnosis not present

## 2021-01-29 DIAGNOSIS — R339 Retention of urine, unspecified: Secondary | ICD-10-CM

## 2021-01-29 DIAGNOSIS — Z9889 Other specified postprocedural states: Secondary | ICD-10-CM

## 2021-01-29 DIAGNOSIS — F0150 Vascular dementia without behavioral disturbance: Secondary | ICD-10-CM

## 2021-01-29 NOTE — Progress Notes (Signed)
Location:  [facility] Room Number: 263-F Place of Service:  SNF [phone]) Provider:  Windell Moulding, NP    Patient Care Team: [name], MD as PCP - General (Internal Medicine)  Extended Emergency Contact Information Primary Emergency Contact: Rudean Hitt Address: [place], [place] [address] [name] of [place] Phone: [phone] Mobile Phone: [phone] Relation: Daughter Secondary Emergency Contact: Simms,Carol Address: [address]          [place], [place] [address] [place] of [place] Phone: [phone] Work Phone: [phone] Relation: Other  Code Status:  DNR Goals of care: Advanced Directive information Advanced Directives 01/29/2021  Does Patient Have a Medical Advance Directive? Yes  Type of Paramedic of [place];Living will;Out of facility DNR (pink MOST or yellow form)  Does patient want to make changes to medical advance directive? No - Patient declined  Copy of [place] in Chart? Yes - validated most recent copy scanned in chart (See row information)  Would patient like information on creating a medical advance directive? -  Pre-existing out of facility DNR order (yellow form or pink MOST form) Yellow form placed in chart (order not valid for inpatient use);Pink MOST form placed in chart (order not valid for inpatient use)     Chief Complaint  Patient presents with   Medical Management of Chronic Issues    Routine Visit and discuss need for zoster, covid, and PNA vaccines or exclude.     HPI:  Pt is a 87 y.o. male seen today for medical management of chronic diseases.    He currently resides on the skilled nursing unit at [facility]. Past medical history includes: hypertension, cameron ulcer, GERD, hypothyroidism, CKD stage III, chronic back pain, cognitive impairment, and generalized weakness.   S/p gastric surgery- d/t GI  bleed and hiatal hernia, he had gastric reduction and Gastropexy. Questran 4g packet bid. Florastor daily, protonix 40 mg daily.   Iron deficiency anemia- Iron 20, TIBC 254 01/14/2021. Niferex 4 times weekly.  Poor appetite- continued weight loss, eating 75-100% of meals, Boost bid.  Generalized weakness- OT daily, PT completed. Ambulates with walker, no recent falls or injuries.  Urinary retention- CT abdomen/pelvis noted severe bilateral hydronephrosis, bladder wall thickening, and distension of bladder. Flomax 0.4 mg daily.  Hypothyroidism- TSH 3.249 11/26/2020, levothyroxine 25 mcg daily.  CKD stage III- BUN/Creat 21/ 1.4 12/26/2020 Vascular dementia- CT head noted small vessel disease in 2015. MMSE 26/30 2020. No behavioral outbursts. Seroquel 12.5 mg bid.  Recent blood pressures:   06/08- 123/67  06/01- 133/61  05/25- 98/56  05/21- 130/70  Recent weight trends:  06/01- 129 lbs  05/03- 134 lbs  04/01- 154.6 lbs  Nurse does not report any concerns, vitals stable.   Past Medical History:  Diagnosis Date   Acute kidney injury ([place]) 03/06/14   Anemia, unspecified 03/06/14   Arthritis    BPH (benign prostatic hyperplasia)    Cerebral atrophy ([place]) 03/06/14   Cerebrovascular disease 03/06/14   Degenerative disc disease, lumbar 03/06/14   Diverticula, bladder acquired    Edema 03/06/14   GERD (gastroesophageal reflux disease)    H/O: GI bleed 1986   Hiatal hernia    History of fall 03/06/14   Hypercalcemia 03/06/14   Hyperlipidemia    Hypertension    Impotence    LFT elevation 03/06/14   Lumbago 03/06/14   Mallory-Weiss tear 1986  Osteoporosis    Rhabdomyolysis 03/06/14   Scoliosis of cervical spine 03/06/14   Scoliosis of lumbar spine 03/06/14   Shingles    Spermatocele    bilateral, left greater than right   Troponin level elevated 03/06/14   Weak 03/06/14   Past Surgical History:  Procedure Laterality Date   BACK SURGERY  10/2009   ESI for surgery for lumbar spinal  stenosis   BIOPSY  11/24/2020   Procedure: BIOPSY;  Surgeon: Milus Banister, MD;  Location: [facility];  Service: Endoscopy;;   clubbed toe repair  2004   COLONOSCOPY  08/15/2011   ESOPHAGOGASTRODUODENOSCOPY (EGD) WITH PROPOFOL N/A 11/24/2020   Procedure: ESOPHAGOGASTRODUODENOSCOPY (EGD) WITH PROPOFOL;  Surgeon: Milus Banister, MD;  Location: [facility];  Service: Endoscopy;  Laterality: N/A;   HIATAL HERNIA REPAIR N/A 11/28/2020   Procedure: Gastrostomy tube placement;  Surgeon: Ralene Ok, MD;  Location: [facility];  Service: General;  Laterality: N/A;  90 MINUTES   INGUINAL HERNIA REPAIR Right 11/26/2004   Dr Rebekah Chesterfield - open w mesh   REPLACEMENT TOTAL KNEE BILATERAL  2007, 2008   TONSILLECTOMY AND ADENOIDECTOMY  1939   X-STOP IMPLANTATION      Allergies  Allergen Reactions   Amrix [Cyclobenzaprine]    Shellfish Allergy     Outpatient Encounter Medications as of 01/29/2021  Medication Sig   acetaminophen (TYLENOL) 325 MG tablet Take 650 mg by mouth 2 (two) times daily as needed.   acetaminophen (TYLENOL) 500 MG tablet Take 1,000 mg by mouth every 8 (eight) hours.   antiseptic oral rinse (BIOTENE) LIQD 15 mLs by Mouth Rinse route 4 (four) times daily as needed for dry mouth.   bisacodyl (DULCOLAX) 10 MG suppository Place 10 mg rectally as needed for moderate constipation.   cholestyramine (QUESTRAN) 4 g packet Take 1 packet (4 g total) by mouth 2 (two) times daily.   iron polysaccharides (NIFEREX) 150 MG capsule Take 150 mg by mouth as directed. On Monday, Tuesday, Thursday, and Saturday   lactose free nutrition (BOOST) LIQD Take 237 mLs by mouth 2 (two) times daily between meals.   levothyroxine (SYNTHROID) 25 MCG tablet Take 1 tablet (25 mcg total) by mouth daily before breakfast.   loperamide (IMODIUM) 2 MG capsule Take 2 mg by mouth every 8 (eight) hours as needed for diarrhea or loose stools.   Melatonin 5 MG CAPS Take 1 capsule (5 mg total) by mouth at bedtime.   ondansetron  (ZOFRAN) 4 MG tablet Take 4 mg by mouth every 6 (six) hours as needed for nausea or vomiting.   pantoprazole (PROTONIX) 40 MG tablet Take 40 mg by mouth daily.   Polyethyl Glycol-Propyl Glycol (SYSTANE) 0.4-0.3 % SOLN Two drops both eyes three times daily as needed for dry itchy eyes.   QUEtiapine (SEROQUEL) 25 MG tablet Take 12.5 mg by mouth 2 (two) times daily.   saccharomyces boulardii (FLORASTOR) 250 MG capsule Take 250 mg by mouth 2 (two) times daily.   tamsulosin (FLOMAX) 0.4 MG CAPS capsule Take 1 capsule (0.4 mg total) by mouth daily.   triamcinolone cream (KENALOG) 0.1 % Apply 1 application topically as needed.   [DISCONTINUED] pantoprazole (PROTONIX) 40 MG tablet Take 1 tablet (40 mg total) by mouth 2 (two) times daily for 30 days, THEN 1 tablet (40 mg total) daily.   [DISCONTINUED] vitamin B-12 (CYANOCOBALAMIN) 1000 MCG tablet Take 500 mcg by mouth daily.    No facility-administered encounter medications on file as of 01/29/2021.  Review of Systems  Constitutional:  Negative for activity change, appetite change, fatigue and fever.  HENT:  Negative for dental problem, hearing loss and trouble swallowing.   Eyes:  Negative for visual disturbance.  Respiratory:  Negative for cough, shortness of breath and wheezing.   Cardiovascular:  Negative for chest pain and leg swelling.  Gastrointestinal:  Negative for abdominal distention, abdominal pain, constipation, diarrhea and nausea.       Gtube  Genitourinary:  Negative for hematuria.       Indwelling foley  Musculoskeletal:  Positive for arthralgias and gait problem.  Skin: Negative.   Neurological:  Positive for weakness. Negative for dizziness and headaches.  Psychiatric/Behavioral:  Positive for confusion. Negative for dysphoric mood and sleep disturbance. The patient is not nervous/anxious.    Immunization History  Administered Date(s) Administered   Influenza, High Dose Seasonal PF 06/08/2020   Moderna Sars-Covid-2  Vaccination 09/09/2019, 09/27/2019   Td 09/02/2007   Tdap 11/11/2017   Pertinent  Health Maintenance Due  Topic Date Due   PNA vac Low Risk Adult (1 of 2 - PCV13) Never done   DEXA SCAN  02/28/2021 (Originally 12/30/2014)   INFLUENZA VACCINE  Discontinued   Fall Risk  02/29/2020 10/19/2019 06/13/2019 03/16/2019 05/12/2018  Falls in the past year? 0 0 0 0 No  Number falls in past yr: 0 0 0 0 -  Injury with Fall? 0 0 0 0 -  Risk for fall due to : - - Impaired balance/gait - -  Follow up - - Falls evaluation completed - -   Functional Status Survey:    Vitals:   01/29/21 1206  BP: 123/67  Pulse: (!) 55  Resp: 16  Temp: (!) 97.3 F (36.3 C)  SpO2: 97%  Weight: 129 lb (58.5 kg)  Height: 5\' 4"  (1.626 m)   Body mass index is 22.14 kg/m. Physical Exam Vitals reviewed.  Constitutional:      General: He is not in acute distress. HENT:     Head: Normocephalic.     Right Ear: There is no impacted cerumen.     Left Ear: There is no impacted cerumen.     Nose: Nose normal.     Mouth/Throat:     Mouth: Mucous membranes are moist.  Eyes:     General:        Right eye: No discharge.        Left eye: No discharge.  Neck:     Thyroid: No thyroid mass or thyromegaly.  Cardiovascular:     Rate and Rhythm: Normal rate and regular rhythm.     Pulses: Normal pulses.     Heart sounds: Normal heart sounds. No murmur heard. Pulmonary:     Effort: Pulmonary effort is normal. No respiratory distress.     Breath sounds: Normal breath sounds. No wheezing.  Abdominal:     General: Bowel sounds are normal. There is no distension.     Palpations: Abdomen is soft.     Tenderness: There is no abdominal tenderness.     Comments: G-tube CDI, no leakage around tube, surrounding skin intact.   Genitourinary:    Comments: Indwelling foley with clear yellow urine in collection bag.  Musculoskeletal:     Cervical back: Normal range of motion.     Right lower leg: No edema.     Left lower leg: No  edema.  Lymphadenopathy:     Cervical: No cervical adenopathy.  Skin:    General: Skin is  warm and dry.     Capillary Refill: Capillary refill takes less than 2 seconds.  Neurological:     General: No focal deficit present.     Mental Status: He is alert. Mental status is at baseline.     Motor: Weakness present.     Gait: Gait abnormal.     Comments: walker  Psychiatric:        Mood and Affect: Mood normal.        Behavior: Behavior normal.        Cognition and Memory: Memory is impaired.    Labs reviewed: Recent Labs    11/26/20 0332 11/27/20 0327 11/28/20 0340 11/29/20 0338 12/06/20 0000 12/10/20 0000 12/26/20 0000  NA 134* 134* 132* 136 134* 133* 136*  K 3.9 4.0 3.9 4.3 3.6 3.8 4.3  CL 109 106 102 105 100 97* 100  CO2 19* 21* 20* 22 26* 23* 22  GLUCOSE 103* 97 98 132*  --   --   --   BUN 24* 21 17 20 21 19 21   CREATININE 1.38* 1.29* 1.49* 1.49* 1.2 1.3 1.4*  CALCIUM 7.7* 7.9* 7.7* 8.1*  --  8.2* 8.8  MG 1.7 2.0 1.8 2.0  --   --   --   PHOS 2.6 3.3 3.0  --   --   --   --    Recent Labs    11/27/20 0327 11/28/20 0340 11/29/20 0338 12/06/20 0000 12/10/20 0000  AST 16 15 16 15 17   ALT 14 12 12 11 15   ALKPHOS 60 64 68 86 97  BILITOT 0.9 0.8 0.7  --   --   PROT 5.6* 5.7* 6.4*  --   --   ALBUMIN 2.5* 2.6* 2.7*  --  3.0*   Recent Labs    11/26/20 0332 11/27/20 0327 11/28/20 0340 11/29/20 0338 12/06/20 0000 12/26/20 0000 01/03/21 0000 01/14/21 0000  WBC 9.2 8.4 9.2 12.1*   < > 11.3 5.3 9.1  NEUTROABS 5.7 5.1 5.8  --   --   --   --   --   HGB 8.1* 7.6*  8.4* 8.4* 9.4*   < > 9.2* 8.0* 8.7*  HCT 24.8* 23.8*  26.1* 26.9* 29.7*   < > 29* 24* 27*  MCV 90.8 91.3 93.4 92.2  --   --   --   --   PLT 240 265 284 262   < > 638* 438* 443*   < > = values in this interval not displayed.   Lab Results  Component Value Date   TSH 3.249 11/26/2020   Lab Results  Component Value Date   HGBA1C 5.6 11/26/2020   No results found for: CHOL, HDL, LDLCALC,  LDLDIRECT, TRIG, CHOLHDL  Significant Diagnostic Results in last 30 days:  No results found.  Assessment/Plan 1. Do not resuscitate  2. Iron deficiency anemia due to chronic blood loss -iron 20, TIBC 254 01/14/2021 - cont niferex 4 times weekly  3. Cameron ulcer, acute with bleeding & anemia - GI signed off - cont Protonix 40 mg daily  4. Protein-calorie malnutrition, moderate (HCC) - 5 lbs weight loss from last month - cont Boost bid  5. Urinary retention - followed by Dr. Claudia Desanctis - cont indwelling foley monthly care - cont Flomax 0.4 mg daily  6. S/P gastric surgery - followed by surgery  7. Stage 3b chronic kidney disease ([place]) -BUN/Creat 21/ 1.4 12/26/2020 - continue to avoid nephrotoxic drugs like NSAIDS and dose adjust medications  to be renally excreted - hydrate  8. Vascular dementia without behavioral disturbance (HCC) - CT head noted small vessel disease  - MMSE 26/30 2020 - no behavioral outbursts - cont Seroquel 12.5 mg bid     Family/ staff Communication: plan discussed with patient and nurse  Labs/tests ordered:  none

## 2021-02-14 DIAGNOSIS — N130 Hydronephrosis with ureteropelvic junction obstruction: Secondary | ICD-10-CM | POA: Diagnosis not present

## 2021-02-15 DIAGNOSIS — N39498 Other specified urinary incontinence: Secondary | ICD-10-CM | POA: Diagnosis not present

## 2021-02-15 LAB — HEPATIC FUNCTION PANEL
ALT: 5 — AB (ref 10–40)
AST: 13 — AB (ref 14–40)
Alkaline Phosphatase: 77 (ref 25–125)
Bilirubin, Total: 0.3

## 2021-02-15 LAB — BASIC METABOLIC PANEL
BUN: 18 (ref 4–21)
CO2: 21 (ref 13–22)
Chloride: 104 (ref 99–108)
Creatinine: 1.3 (ref 0.6–1.3)
Glucose: 78
Potassium: 4.1 (ref 3.4–5.3)
Sodium: 136 — AB (ref 137–147)

## 2021-02-15 LAB — COMPREHENSIVE METABOLIC PANEL
Albumin: 3.2 — AB (ref 3.5–5.0)
Calcium: 8 — AB (ref 8.7–10.7)
Globulin: 2.7

## 2021-03-04 ENCOUNTER — Encounter: Payer: Self-pay | Admitting: Internal Medicine

## 2021-03-04 ENCOUNTER — Non-Acute Institutional Stay (SKILLED_NURSING_FACILITY): Payer: Medicare Other | Admitting: Internal Medicine

## 2021-03-04 DIAGNOSIS — R339 Retention of urine, unspecified: Secondary | ICD-10-CM | POA: Diagnosis not present

## 2021-03-04 DIAGNOSIS — Z9889 Other specified postprocedural states: Secondary | ICD-10-CM

## 2021-03-04 DIAGNOSIS — E039 Hypothyroidism, unspecified: Secondary | ICD-10-CM | POA: Diagnosis not present

## 2021-03-04 DIAGNOSIS — N1832 Chronic kidney disease, stage 3b: Secondary | ICD-10-CM

## 2021-03-04 DIAGNOSIS — D500 Iron deficiency anemia secondary to blood loss (chronic): Secondary | ICD-10-CM

## 2021-03-04 DIAGNOSIS — R4189 Other symptoms and signs involving cognitive functions and awareness: Secondary | ICD-10-CM | POA: Diagnosis not present

## 2021-03-04 DIAGNOSIS — R630 Anorexia: Secondary | ICD-10-CM

## 2021-03-04 NOTE — Progress Notes (Signed)
Location:   [facility] Room Number: 132 Place of Service:  SNF [phone]) Provider:  Veleta Miners MD  [name], MD  Patient Care Team: [name], MD as PCP - General (Internal Medicine)  Extended Emergency Contact Information Primary Emergency Contact: Rudean Hitt Address: [place], [place] [address] [name] of [place] Phone: [phone] Mobile Phone: [phone] Relation: Daughter Secondary Emergency Contact: Simms,Carol Address: [address]          [place], [place] [address] [place] of [place] Phone: [phone] Work Phone: [phone] Relation: Other  Code Status:  DNR Goals of care: Advanced Directive information Advanced Directives 03/04/2021  Does Patient Have a Medical Advance Directive? Yes  Type of Paramedic of [place];Living will;Out of facility DNR (pink MOST or yellow form)  Does patient want to make changes to medical advance directive? No - Patient declined  Copy of [place] in Chart? Yes - validated most recent copy scanned in chart (See row information)  Would patient like information on creating a medical advance directive? -  Pre-existing out of facility DNR order (yellow form or pink MOST form) Yellow form placed in chart (order not valid for inpatient use);Pink MOST form placed in chart (order not valid for inpatient use)     Chief Complaint  Patient presents with   Medical Management of Chronic Issues   Health Maintenance    Shingrix, PCV13, Dexa scan, #3 covid booster    HPI:  Pt is a 87 y.o. male seen today for medical management of chronic diseases.    Patient was admitted to Hospital From 4/8 -4/15 For GI bleed with Anemia with Large Hiatal Hernia s/p Gastric Reduction and Gastropexy Also had Urinary Retention with Bilateral Hydronephrosis  now has Foley Cathter  Doing well. IN SNF for long term  care No New issues Appetite is still poor No Using G tube Has lost 6 lbs since my last visit He denies any Nausea or Abdominal Pain  Past Medical History:  Diagnosis Date   Acute kidney injury ([place]) 03/06/14   Anemia, unspecified 03/06/14   Arthritis    BPH (benign prostatic hyperplasia)    Cerebral atrophy ([place]) 03/06/14   Cerebrovascular disease 03/06/14   Degenerative disc disease, lumbar 03/06/14   Diverticula, bladder acquired    Edema 03/06/14   GERD (gastroesophageal reflux disease)    H/O: GI bleed 1986   Hiatal hernia    History of fall 03/06/14   Hypercalcemia 03/06/14   Hyperlipidemia    Hypertension    Impotence    LFT elevation 03/06/14   Lumbago 03/06/14   Mallory-Weiss tear 1986   Osteoporosis    Rhabdomyolysis 03/06/14   Scoliosis of cervical spine 03/06/14   Scoliosis of lumbar spine 03/06/14   Shingles    Spermatocele    bilateral, left greater than right   Troponin level elevated 03/06/14   Weak 03/06/14   Past Surgical History:  Procedure Laterality Date   BACK SURGERY  10/2009   ESI for surgery for lumbar spinal stenosis   BIOPSY  11/24/2020   Procedure: BIOPSY;  Surgeon: Milus Banister, MD;  Location: [facility];  Service: Endoscopy;;   clubbed toe repair  2004   COLONOSCOPY  08/15/2011   ESOPHAGOGASTRODUODENOSCOPY (EGD) WITH PROPOFOL N/A 11/24/2020   Procedure: ESOPHAGOGASTRODUODENOSCOPY (EGD) WITH PROPOFOL;  Surgeon: Milus Banister, MD;  Location:  [facility];  Service: Endoscopy;  Laterality: N/A;   HIATAL HERNIA REPAIR N/A 11/28/2020   Procedure: Gastrostomy tube placement;  Surgeon: Ralene Ok, MD;  Location: [facility];  Service: General;  Laterality: N/A;  [address] [place] Right 11/26/2004   Dr Rebekah [place]field - open w mesh   REPLACEMENT TOTAL KNEE BILATERAL  2007, 2008   TONSILLECTOMY AND ADENOIDECTOMY  1939   X-STOP IMPLANTATION      Allergies  Allergen Reactions   Amrix [Cyclobenzaprine]    Shellfish Allergy     Allergies  as of 03/04/2021       Reactions   Amrix [cyclobenzaprine]    Shellfish Allergy         Medication List        Accurate as of March 04, 2021 12:11 PM. If you have any questions, ask your nurse or doctor.          acetaminophen 500 MG tablet Commonly known as: TYLENOL Take 1,000 mg by mouth every 8 (eight) hours.   acetaminophen 325 MG tablet Commonly known as: TYLENOL Take 650 mg by mouth 2 (two) times daily as needed.   antiseptic oral rinse Liqd 15 mLs by Mouth Rinse route 4 (four) times daily as needed for dry mouth.   bisacodyl 10 MG suppository Commonly known as: DULCOLAX Place 10 mg rectally as needed for moderate constipation.   cholestyramine 4 g packet Commonly known as: Questran Take 1 packet (4 g total) by mouth 2 (two) times daily.   iron polysaccharides 150 MG capsule Commonly known as: NIFEREX Take 150 mg by mouth as directed. On Monday, Tuesday, Thursday, and Saturday   lactose free nutrition Liqd Take 237 mLs by mouth 2 (two) times daily between meals.   levothyroxine 25 MCG tablet Commonly known as: SYNTHROID Take 1 tablet (25 mcg total) by mouth daily before breakfast.   loperamide 2 MG capsule Commonly known as: IMODIUM Take 2 mg by mouth every 8 (eight) hours as needed for diarrhea or loose stools.   Melatonin 5 MG Caps Take 1 capsule (5 mg total) by mouth at bedtime.   ondansetron 4 MG tablet Commonly known as: ZOFRAN Take 4 mg by mouth every 6 (six) hours as needed for nausea or vomiting.   pantoprazole 40 MG tablet Commonly known as: PROTONIX Take 40 mg by mouth daily.   QUEtiapine 25 MG tablet Commonly known as: SEROQUEL Take 12.5 mg by mouth 2 (two) times daily.   saccharomyces boulardii 250 MG capsule Commonly known as: FLORASTOR Take 250 mg by mouth 2 (two) times daily.   Systane 0.4-0.3 % Soln Generic drug: Polyethyl Glycol-Propyl Glycol Two drops both eyes three times daily as needed for dry itchy eyes.    tamsulosin 0.4 MG Caps capsule Commonly known as: FLOMAX Take 1 capsule (0.4 mg total) by mouth daily.   triamcinolone cream 0.1 % Commonly known as: KENALOG Apply 1 application topically as needed.        Review of Systems Review of Systems  Constitutional: Negative for activity change, appetite change, chills, diaphoresis, fatigue and fever.  HENT: Negative for mouth sores, postnasal drip, rhinorrhea, sinus pain and sore throat.   Respiratory: Negative for apnea, cough, chest tightness, shortness of breath and wheezing.   Cardiovascular: Negative for chest pain, palpitations and leg swelling.  Gastrointestinal: Negative for abdominal distention, abdominal pain, constipation, diarrhea, nausea and vomiting.  Genitourinary: Negative for dysuria and frequency.  Musculoskeletal: Negative for arthralgias, joint swelling and myalgias.  Skin: Negative for rash.  Neurological: Negative for dizziness, syncope, weakness, light-headedness and numbness.  Psychiatric/Behavioral: Negative for behavioral problems, confusion and sleep disturbance.    Immunization History  Administered Date(s) Administered   Influenza, High Dose Seasonal PF 06/08/2020   Moderna Sars-Covid-2 Vaccination 09/09/2019, 09/27/2019   Td 09/02/2007   Tdap 11/11/2017   Pertinent  Health Maintenance Due  Topic Date Due   PNA vac Low Risk Adult (1 of 2 - PCV13) Never done   DEXA SCAN  12/30/2014   INFLUENZA VACCINE  Discontinued   Fall Risk  02/29/2020 10/19/2019 06/13/2019 03/16/2019 05/12/2018  Falls in the past year? 0 0 0 0 No  Number falls in past yr: 0 0 0 0 -  Injury with Fall? 0 0 0 0 -  Risk for fall due to : - - Impaired balance/gait - -  Follow up - - Falls evaluation completed - -   Functional Status Survey:    Vitals:   03/04/21 1200  BP: (!) 156/83  Pulse: 67  Resp: 16  Temp: 98.1 F (36.7 C)  SpO2: 95%  Weight: 129 lb (58.5 kg)  Height: 5\' 4"  (1.626 m)   Body mass index is 22.14  kg/m. Physical Exam Vitals reviewed.  Constitutional:      Appearance: Normal appearance.  HENT:     Head: Normocephalic.     Nose: Nose normal.     Mouth/Throat:     Mouth: Mucous membranes are moist.     Pharynx: Oropharynx is clear.  Eyes:     Pupils: Pupils are equal, round, and reactive to light.  Cardiovascular:     Pulses: Normal pulses.     Heart sounds: Normal heart sounds.  Pulmonary:     Effort: Pulmonary effort is normal.     Breath sounds: Normal breath sounds.  Abdominal:     General: Abdomen is flat.     Palpations: Abdomen is soft.  Musculoskeletal:        General: No swelling.     Cervical back: Neck supple.  Skin:    General: Skin is warm.  Neurological:     General: No focal deficit present.     Mental Status: He is alert.  Psychiatric:        Mood and Affect: Mood normal.        Thought Content: Thought content normal.    Labs reviewed: Recent Labs    11/26/20 0332 11/27/20 0327 11/28/20 0340 11/29/20 0338 12/06/20 0000 12/10/20 0000 12/26/20 0000 02/15/21 0000  NA 134* 134* 132* 136   < > 133* 136* 136*  K 3.9 4.0 3.9 4.3   < > 3.8 4.3 4.1  CL 109 106 102 105   < > 97* 100 104  CO2 19* 21* 20* 22   < > 23* 22 21  GLUCOSE 103* 97 98 132*  --   --   --   --   BUN 24* 21 17 20    < > 19 21 18   CREATININE 1.38* 1.29* 1.49* 1.49*   < > 1.3 1.4* 1.3  CALCIUM 7.7* 7.9* 7.7* 8.1*  --  8.2* 8.8 8.0*  MG 1.7 2.0 1.8 2.0  --   --   --   --   PHOS 2.6 3.3 3.0  --   --   --   --   --    < > = values in this interval not displayed.   Recent Labs    11/27/20 0327 11/28/20  0340 11/29/20 0338 12/06/20 0000 12/10/20 0000 02/15/21 0000  AST 16 15 16 15 17  13*  ALT 14 12 12 11 15  5*  ALKPHOS 60 64 68 86 97 77  BILITOT 0.9 0.8 0.7  --   --   --   PROT 5.6* 5.7* 6.4*  --   --   --   ALBUMIN 2.5* 2.6* 2.7*  --  3.0* 3.2*   Recent Labs    11/26/20 0332 11/27/20 0327 11/28/20 0340 11/29/20 0338 12/06/20 0000 12/26/20 0000 01/03/21 0000  01/14/21 0000  WBC 9.2 8.4 9.2 12.1*   < > 11.3 5.3 9.1  NEUTROABS 5.7 5.1 5.8  --   --   --   --   --   HGB 8.1* 7.6*  8.4* 8.4* 9.4*   < > 9.2* 8.0* 8.7*  HCT 24.8* 23.8*  26.1* 26.9* 29.7*   < > 29* 24* 27*  MCV 90.8 91.3 93.4 92.2  --   --   --   --   PLT 240 265 284 262   < > 638* 438* 443*   < > = values in this interval not displayed.   Lab Results  Component Value Date   TSH 3.249 11/26/2020   Lab Results  Component Value Date   HGBA1C 5.6 11/26/2020   No results found for: CHOL, HDL, LDLCALC, LDLDIRECT, TRIG, CHOLHDL  Significant Diagnostic Results in last 30 days:  No results found.  Assessment/Plan S/P gastric surgery Doing well Appetite still not good Consider Follow up with Surgery for G tube Removal Continue  Protonix  Iron deficiency anemia due to chronic blood loss Repeat CBC On Iron  Poor appetite Weight Q 2 weeks Working Software engineer Urinary retention Has Chronic Foley now On Flomax Cognitive impairment Doing good Change Seroquel to 12.5 mg QHS Consider Tapering off  Stage 3b chronic kidney disease (HCC) Creat stable  Acquired hypothyroidism TSH normal in 4/22 Hypertension BP slightly Elevated Off Norvasc  Will monitor  Family/ staff Communication:   Labs/tests ordered:  CBC

## 2021-03-05 DIAGNOSIS — I10 Essential (primary) hypertension: Secondary | ICD-10-CM | POA: Diagnosis not present

## 2021-03-05 LAB — CBC: RBC: 3.44 — AB (ref 3.87–5.11)

## 2021-03-05 LAB — CBC AND DIFFERENTIAL
HCT: 27 — AB (ref 41–53)
Hemoglobin: 8.9 — AB (ref 13.5–17.5)
Platelets: 398 (ref 150–399)
WBC: 6.2

## 2021-03-25 ENCOUNTER — Non-Acute Institutional Stay (SKILLED_NURSING_FACILITY): Payer: Medicare Other | Admitting: Adult Health

## 2021-03-25 DIAGNOSIS — K219 Gastro-esophageal reflux disease without esophagitis: Secondary | ICD-10-CM | POA: Diagnosis not present

## 2021-03-25 DIAGNOSIS — D500 Iron deficiency anemia secondary to blood loss (chronic): Secondary | ICD-10-CM

## 2021-03-25 DIAGNOSIS — K580 Irritable bowel syndrome with diarrhea: Secondary | ICD-10-CM | POA: Diagnosis not present

## 2021-03-25 DIAGNOSIS — K253 Acute gastric ulcer without hemorrhage or perforation: Secondary | ICD-10-CM | POA: Diagnosis not present

## 2021-03-25 DIAGNOSIS — N1832 Chronic kidney disease, stage 3b: Secondary | ICD-10-CM

## 2021-03-25 DIAGNOSIS — F0150 Vascular dementia without behavioral disturbance: Secondary | ICD-10-CM | POA: Diagnosis not present

## 2021-03-25 DIAGNOSIS — E440 Moderate protein-calorie malnutrition: Secondary | ICD-10-CM | POA: Diagnosis not present

## 2021-03-25 NOTE — Progress Notes (Signed)
Location:  Occupational psychologist of Service:  SNF (31) Provider:   Cindi Carbon, [place] [phone]   [name], MD  Patient Care Team: [name], MD as PCP - General (Internal Medicine)  Extended Emergency Contact Information Primary Emergency Contact: Rudean Hitt Address: [place], [place] [address] [name] of [place] Phone: [phone] Mobile Phone: [phone] Relation: Daughter Secondary Emergency Contact: Simms,Carol Address: [address]          [place], [place] [address] [place] of [place] Phone: [phone] Work Phone: [phone] Relation: Other  Code Status:  DNR Goals of care: Advanced Directive information Advanced Directives 03/04/2021  Does Patient Have a Medical Advance Directive? Yes  Type of Paramedic of [place];Living will;Out of facility DNR (pink MOST or yellow form)  Does patient want to make changes to medical advance directive? No - Patient declined  Copy of [place] in Chart? Yes - validated most recent copy scanned in chart (See row information)  Would patient like information on creating a medical advance directive? -  Pre-existing out of facility DNR order (yellow form or pink MOST form) Yellow form placed in chart (order not valid for inpatient use);Pink MOST form placed in chart (order not valid for inpatient use)     Chief Complaint  Patient presents with   Medical Management of Chronic Issues    HPI:  Pt is a 87 y.o. male seen today for medical management of chronic diseases.   s/p hospitalization for acute GIB with cameron ulcer and large hiatal hernia.  He is s/p  Gastric reduction and gastropexy by Dr. Rosendo Gros on 11/28/20. He also has a foley cath in place due to urinary retention and bilateral hydronephrosis.  He is eating and drinking without any issues with diarrhea,  constipation, or abd pain. No fever . Had a hx of diarrhea due to possible IBS and is on a probiotic and questran Continues with a feeding tube that is not being used. Awaiting an apt with surgery for eval fo removal. It has never been used.   Per his request boost was changed to prn. Weight is trending up 1% in the past month.   Has vascular dementia with MMSE 24/30 12/04/20. Tolerated dose reduction of seroquel with no behavioral issues.   Past Medical History:  Diagnosis Date   Acute kidney injury ([place]) 03/06/14   Anemia, unspecified 03/06/14   Arthritis    BPH (benign prostatic hyperplasia)    Cerebral atrophy ([place]) 03/06/14   Cerebrovascular disease 03/06/14   Degenerative disc disease, lumbar 03/06/14   Diverticula, bladder acquired    Edema 03/06/14   GERD (gastroesophageal reflux disease)    H/O: GI bleed 1986   Hiatal hernia    History of fall 03/06/14   Hypercalcemia 03/06/14   Hyperlipidemia    Hypertension    Impotence    LFT elevation 03/06/14   Lumbago 03/06/14   Mallory-Weiss tear 1986   Osteoporosis    Rhabdomyolysis 03/06/14   Scoliosis of cervical spine 03/06/14   Scoliosis of lumbar spine 03/06/14   Shingles    Spermatocele    bilateral, left greater than right   Troponin level elevated 03/06/14   Weak 03/06/14   Past Surgical History:  Procedure Laterality Date   BACK SURGERY  10/2009   ESI for surgery for lumbar spinal stenosis  BIOPSY  11/24/2020   Procedure: BIOPSY;  Surgeon: Milus Banister, MD;  Location: [facility];  Service: Endoscopy;;   clubbed toe repair  2004   COLONOSCOPY  08/15/2011   ESOPHAGOGASTRODUODENOSCOPY (EGD) WITH PROPOFOL N/A 11/24/2020   Procedure: ESOPHAGOGASTRODUODENOSCOPY (EGD) WITH PROPOFOL;  Surgeon: Milus Banister, MD;  Location: [facility];  Service: Endoscopy;  Laterality: N/A;   HIATAL HERNIA REPAIR N/A 11/28/2020   Procedure: Gastrostomy tube placement;  Surgeon: Ralene Ok, MD;  Location: [facility] ORS;  Service: General;   Laterality: N/A;  90 MINUTES   INGUINAL HERNIA REPAIR Right 11/26/2004   Dr Rebekah Chesterfield - open w mesh   REPLACEMENT TOTAL KNEE BILATERAL  2007, 2008   TONSILLECTOMY AND ADENOIDECTOMY  1939   X-STOP IMPLANTATION      Allergies  Allergen Reactions   Amrix [Cyclobenzaprine]    Shellfish Allergy     Outpatient Encounter Medications as of 03/25/2021  Medication Sig   acetaminophen (TYLENOL) 500 MG tablet Take 1,000 mg by mouth every 8 (eight) hours.   antiseptic oral rinse (BIOTENE) LIQD 15 mLs by Mouth Rinse route 4 (four) times daily as needed for dry mouth.   bisacodyl (DULCOLAX) 10 MG suppository Place 10 mg rectally as needed for moderate constipation.   cholestyramine (QUESTRAN) 4 g packet Take 1 packet (4 g total) by mouth 2 (two) times daily.   iron polysaccharides (NIFEREX) 150 MG capsule Take 150 mg by mouth as directed. On Monday, Tuesday, Thursday, and Saturday   lactose free nutrition (BOOST) LIQD Take 237 mLs by mouth 2 (two) times daily as needed.   levothyroxine (SYNTHROID) 25 MCG tablet Take 1 tablet (25 mcg total) by mouth daily before breakfast.   loperamide (IMODIUM) 2 MG capsule Take 2 mg by mouth every 8 (eight) hours as needed for diarrhea or loose stools.   Melatonin 5 MG CAPS Take 1 capsule (5 mg total) by mouth at bedtime.   ondansetron (ZOFRAN) 4 MG tablet Take 4 mg by mouth every 6 (six) hours as needed for nausea or vomiting.   pantoprazole (PROTONIX) 40 MG tablet Take 40 mg by mouth daily.   Polyethyl Glycol-Propyl Glycol (SYSTANE) 0.4-0.3 % SOLN Two drops both eyes three times daily as needed for dry itchy eyes.   QUEtiapine (SEROQUEL) 25 MG tablet Take 12.5 mg by mouth at bedtime.   saccharomyces boulardii (FLORASTOR) 250 MG capsule Take 250 mg by mouth 2 (two) times daily.   tamsulosin (FLOMAX) 0.4 MG CAPS capsule Take 1 capsule (0.4 mg total) by mouth daily.   triamcinolone cream (KENALOG) 0.1 % Apply 1 application topically as needed.   [DISCONTINUED]  acetaminophen (TYLENOL) 325 MG tablet Take 650 mg by mouth 2 (two) times daily as needed.   No facility-administered encounter medications on file as of 03/25/2021.    Review of Systems  Constitutional:  Negative for activity change, appetite change, chills, diaphoresis, fatigue, fever and unexpected weight change.  Respiratory:  Negative for cough, shortness of breath, wheezing and stridor.   Cardiovascular:  Negative for chest pain, palpitations and leg swelling.  Gastrointestinal:  Negative for abdominal distention, abdominal pain, constipation and diarrhea.  Genitourinary:  Negative for difficulty urinating and dysuria.  Musculoskeletal:  Positive for gait problem. Negative for arthralgias, back pain, joint swelling and myalgias.  Neurological:  Negative for dizziness, seizures, syncope, facial asymmetry, speech difficulty, weakness and headaches.  Hematological:  Negative for adenopathy. Does not bruise/bleed easily.  Psychiatric/Behavioral:  Positive for confusion. Negative for agitation and behavioral problems.  Immunization History  Administered Date(s) Administered   Influenza, High Dose Seasonal PF 06/08/2020   Moderna Sars-Covid-2 Vaccination 09/09/2019, 09/27/2019   Td 09/02/2007   Tdap 11/11/2017   Pertinent  Health Maintenance Due  Topic Date Due   PNA vac Low Risk Adult (1 of 2 - PCV13) Never done   DEXA SCAN  12/30/2014   INFLUENZA VACCINE  Discontinued   Fall Risk  02/29/2020 10/19/2019 06/13/2019 03/16/2019 05/12/2018  Falls in the past year? 0 0 0 0 No  Number falls in past yr: 0 0 0 0 -  Injury with Fall? 0 0 0 0 -  Risk for fall due to : - - Impaired balance/gait - -  Follow up - - Falls evaluation completed - -   Functional Status Survey:    Vitals:   03/27/21 0746  Weight: 135 lb 12.8 oz (61.6 kg)   Body mass index is 23.31 kg/m. Physical Exam Vitals and nursing note reviewed.  Constitutional:      General: He is not in acute distress.    Appearance:  He is not diaphoretic.  HENT:     Head: Normocephalic and atraumatic.     Mouth/Throat:     Mouth: Mucous membranes are moist.     Pharynx: Oropharynx is clear.  Neck:     Thyroid: No thyromegaly.     Vascular: No JVD.     Trachea: No tracheal deviation.  Cardiovascular:     Rate and Rhythm: Normal rate and regular rhythm.     Heart sounds: No murmur heard. Pulmonary:     Effort: Pulmonary effort is normal. No respiratory distress.     Breath sounds: Normal breath sounds. No wheezing.  Abdominal:     General: Bowel sounds are normal. There is no distension.     Palpations: Abdomen is soft.     Tenderness: There is no abdominal tenderness.  Musculoskeletal:        General: Deformity (kyphoscoliosis) present.     Right lower leg: No edema.     Left lower leg: No edema.  Lymphadenopathy:     Cervical: No cervical adenopathy.  Skin:    General: Skin is warm and dry.  Neurological:     Mental Status: He is alert and oriented to person, place, and time.     Cranial Nerves: No cranial nerve deficit.  Psychiatric:        Mood and Affect: Mood normal.    Labs reviewed: Recent Labs    11/26/20 0332 11/27/20 0327 11/28/20 0340 11/29/20 0338 12/06/20 0000 12/10/20 0000 12/26/20 0000 02/15/21 0000  NA 134* 134* 132* 136   < > 133* 136* 136*  K 3.9 4.0 3.9 4.3   < > 3.8 4.3 4.1  CL 109 106 102 105   < > 97* 100 104  CO2 19* 21* 20* 22   < > 23* 22 21  GLUCOSE 103* 97 98 132*  --   --   --   --   BUN 24* '21 17 20   '$ < > '19 21 18  '$ CREATININE 1.38* 1.29* 1.49* 1.49*   < > 1.3 1.4* 1.3  CALCIUM 7.7* 7.9* 7.7* 8.1*  --  8.2* 8.8 8.0*  MG 1.7 2.0 1.8 2.0  --   --   --   --   PHOS 2.6 3.3 3.0  --   --   --   --   --    < > = values in this interval  not displayed.   Recent Labs    11/27/20 0327 11/28/20 0340 11/29/20 0338 12/06/20 0000 12/10/20 0000 02/15/21 0000  AST '16 15 16 15 17 '$ 13*  ALT '14 12 12 11 15 '$ 5*  ALKPHOS 60 64 68 86 97 77  BILITOT 0.9 0.8 0.7  --   --   --    PROT 5.6* 5.7* 6.4*  --   --   --   ALBUMIN 2.5* 2.6* 2.7*  --  3.0* 3.2*   Recent Labs    11/26/20 0332 11/27/20 0327 11/28/20 0340 11/29/20 0338 12/06/20 0000 01/03/21 0000 01/14/21 0000 03/05/21 0000  WBC 9.2 8.4 9.2 12.1*   < > 5.3 9.1 6.2  NEUTROABS 5.7 5.1 5.8  --   --   --   --   --   HGB 8.1* 7.6*  8.4* 8.4* 9.4*   < > 8.0* 8.7* 8.9*  HCT 24.8* 23.8*  26.1* 26.9* 29.7*   < > 24* 27* 27*  MCV 90.8 91.3 93.4 92.2  --   --   --   --   PLT 240 265 284 262   < > 438* 443* 398   < > = values in this interval not displayed.   Lab Results  Component Value Date   TSH 3.249 11/26/2020   Lab Results  Component Value Date   HGBA1C 5.6 11/26/2020   No results found for: CHOL, HDL, LDLCALC, LDLDIRECT, TRIG, CHOLHDL  Significant Diagnostic Results in last 30 days:  No results found.  Assessment/Plan  1. Cameron ulcer, acute with bleeding & anemia S/p surgery with gastric reduction and gastropexy Continue PPI  2. Gastroesophageal reflux disease, unspecified whether esophagitis present No current symptoms Continue ppi  3. Stage 3b chronic kidney disease ([place]) . Lab Results  Component Value Date   BUN 18 02/15/2021    Lab Results  Component Value Date   CREATININE 1.3 02/15/2021     4. Protein-calorie malnutrition, moderate (HCC) Boost prn Monitor weight He is a small eater and has had a low BMI even prior to surgery  5. Iron deficiency anemia due to chronic blood loss Lab Results  Component Value Date   HGB 8.9 (A) 03/05/2021  Continue Niferex Check CBC   6. Vascular dementia without behavioral disturbance (HCC) Doing well on low dose seroquel Consider further taper next month  7. IBS Controlled with questran Continue FLorastor   Labs/tests ordered:  CBC CMP  2 weeks

## 2021-03-27 ENCOUNTER — Encounter: Payer: Self-pay | Admitting: Adult Health

## 2021-03-27 DIAGNOSIS — K580 Irritable bowel syndrome with diarrhea: Secondary | ICD-10-CM | POA: Insufficient documentation

## 2021-04-03 DIAGNOSIS — D039 Melanoma in situ, unspecified: Secondary | ICD-10-CM | POA: Diagnosis not present

## 2021-04-03 DIAGNOSIS — L821 Other seborrheic keratosis: Secondary | ICD-10-CM | POA: Diagnosis not present

## 2021-04-03 DIAGNOSIS — L720 Epidermal cyst: Secondary | ICD-10-CM | POA: Diagnosis not present

## 2021-04-03 DIAGNOSIS — L814 Other melanin hyperpigmentation: Secondary | ICD-10-CM | POA: Diagnosis not present

## 2021-04-03 DIAGNOSIS — D692 Other nonthrombocytopenic purpura: Secondary | ICD-10-CM | POA: Diagnosis not present

## 2021-04-03 DIAGNOSIS — D229 Melanocytic nevi, unspecified: Secondary | ICD-10-CM | POA: Diagnosis not present

## 2021-04-03 DIAGNOSIS — L570 Actinic keratosis: Secondary | ICD-10-CM | POA: Diagnosis not present

## 2021-04-04 ENCOUNTER — Emergency Department (HOSPITAL_BASED_OUTPATIENT_CLINIC_OR_DEPARTMENT_OTHER): Payer: Medicare Other

## 2021-04-04 ENCOUNTER — Emergency Department (HOSPITAL_BASED_OUTPATIENT_CLINIC_OR_DEPARTMENT_OTHER)
Admission: EM | Admit: 2021-04-04 | Discharge: 2021-04-04 | Disposition: A | Discharge status: Home / Self Care | Payer: Medicare Other | Attending: Emergency Medicine | Admitting: Emergency Medicine

## 2021-04-04 ENCOUNTER — Other Ambulatory Visit: Payer: Self-pay

## 2021-04-04 ENCOUNTER — Encounter (HOSPITAL_BASED_OUTPATIENT_CLINIC_OR_DEPARTMENT_OTHER): Payer: Self-pay

## 2021-04-04 DIAGNOSIS — S0101XA Laceration without foreign body of scalp, initial encounter: Secondary | ICD-10-CM | POA: Insufficient documentation

## 2021-04-04 DIAGNOSIS — Y92009 Unspecified place in unspecified non-institutional (private) residence as the place of occurrence of the external cause: Secondary | ICD-10-CM | POA: Insufficient documentation

## 2021-04-04 DIAGNOSIS — Z79899 Other long term (current) drug therapy: Secondary | ICD-10-CM | POA: Insufficient documentation

## 2021-04-04 DIAGNOSIS — Z87891 Personal history of nicotine dependence: Secondary | ICD-10-CM | POA: Diagnosis not present

## 2021-04-04 DIAGNOSIS — S0990XA Unspecified injury of head, initial encounter: Secondary | ICD-10-CM | POA: Diagnosis not present

## 2021-04-04 DIAGNOSIS — W07XXXA Fall from chair, initial encounter: Secondary | ICD-10-CM | POA: Diagnosis not present

## 2021-04-04 DIAGNOSIS — Z96653 Presence of artificial knee joint, bilateral: Secondary | ICD-10-CM | POA: Insufficient documentation

## 2021-04-04 DIAGNOSIS — E039 Hypothyroidism, unspecified: Secondary | ICD-10-CM | POA: Diagnosis not present

## 2021-04-04 DIAGNOSIS — W19XXXA Unspecified fall, initial encounter: Secondary | ICD-10-CM | POA: Diagnosis not present

## 2021-04-04 DIAGNOSIS — N183 Chronic kidney disease, stage 3 unspecified: Secondary | ICD-10-CM | POA: Diagnosis not present

## 2021-04-04 DIAGNOSIS — I129 Hypertensive chronic kidney disease with stage 1 through stage 4 chronic kidney disease, or unspecified chronic kidney disease: Secondary | ICD-10-CM | POA: Insufficient documentation

## 2021-04-04 DIAGNOSIS — R5381 Other malaise: Secondary | ICD-10-CM | POA: Diagnosis not present

## 2021-04-04 DIAGNOSIS — Z23 Encounter for immunization: Secondary | ICD-10-CM | POA: Insufficient documentation

## 2021-04-04 DIAGNOSIS — S078XXA Crushing injury of other parts of head, initial encounter: Secondary | ICD-10-CM | POA: Diagnosis not present

## 2021-04-04 MED ORDER — TETANUS-DIPHTH-ACELL PERTUSSIS 5-2.5-18.5 LF-MCG/0.5 IM SUSY
0.5000 mL | PREFILLED_SYRINGE | Freq: Once | INTRAMUSCULAR | Status: AC
  Administered 2021-04-04: 0.5 mL via INTRAMUSCULAR
  Filled 2021-04-04: qty 0.5

## 2021-04-04 MED ORDER — LIDOCAINE-EPINEPHRINE (PF) 2 %-1:200000 IJ SOLN
20.0000 mL | Freq: Once | INTRAMUSCULAR | Status: AC
  Administered 2021-04-04: 20 mL
  Filled 2021-04-04: qty 20

## 2021-04-04 NOTE — ED Notes (Signed)
Patient transported to CT 

## 2021-04-04 NOTE — ED Provider Notes (Signed)
[facility] DEPT Provider Note   CSN: [id number] Arrival date & time: 04/04/21  1312     History Chief Complaint  Patient presents with   Head Laceration    [name] is a 87 y.o. male.  He is here with a complaint of fall and head injury.  He said he was trying to go from his chair to his bed when he lost his footing and fell striking the back of his head.  He denies any loss of consciousness.  He has a few areas of laceration.  Denies any chest pain or shortness of breath.  He is mostly concerned that his leg bag might of come undone.  The history is provided by the patient.  Head Laceration This is a new problem. The current episode started 1 to 2 hours ago. The problem has not changed since onset.Associated symptoms include headaches. Pertinent negatives include no chest pain, no abdominal pain and no shortness of breath. Nothing aggravates the symptoms. Nothing relieves the symptoms. He has tried nothing for the symptoms. The treatment provided no relief.      Past Medical History:  Diagnosis Date   Acute kidney injury ([place]) 03/06/14   Anemia, unspecified 03/06/14   Arthritis    BPH (benign prostatic hyperplasia)    Cerebral atrophy ([place]) 03/06/14   Cerebrovascular disease 03/06/14   Degenerative disc disease, lumbar 03/06/14   Diverticula, bladder acquired    Edema 03/06/14   GERD (gastroesophageal reflux disease)    H/O: GI bleed 1986   Hiatal hernia    History of fall 03/06/14   Hypercalcemia 03/06/14   Hyperlipidemia    Hypertension    Impotence    LFT elevation 03/06/14   Lumbago 03/06/14   Mallory-Weiss tear 1986   Osteoporosis    Rhabdomyolysis 03/06/14   Scoliosis of cervical spine 03/06/14   Scoliosis of lumbar spine 03/06/14   Shingles    Spermatocele    bilateral, left greater than right   Troponin level elevated 03/06/14   Weak 03/06/14    Patient Active Problem List   Diagnosis Date Noted   Irritable bowel syndrome with diarrhea  03/27/2021   HOH (hard of hearing) 11/25/2020   Scoliosis of thoracolumbar region due to degenerative disease of spine in adult 11/25/2020   Right groin mass c/w sebaceous cyst 11/25/2020   Cameron ulcer, acute with bleeding & anemia 11/25/2020   IDA (iron deficiency anemia) 11/25/2020   Uses roller walker 11/25/2020   HTN (hypertension) 11/24/2020   Hypothyroidism 11/24/2020   Cognitive impairment 11/24/2020   CKD (chronic kidney disease), stage III ([place]) 02/03/2019   H/O sinus bradycardia 11/20/2017   Senile osteoporosis 11/20/2017   Other idiopathic scoliosis, thoracolumbar region 11/20/2017   Continuous leakage of urine 11/20/2017   Protein-calorie malnutrition, moderate ([place]) 02/28/2014   Chronic low back pain 02/26/2014   Elevated LFTs 02/26/2014   Alcohol use 02/26/2014   Generalized weakness 02/26/2014   GERD (gastroesophageal reflux disease) 08/13/2011   Lumbar disc disease 08/13/2011    Past Surgical History:  Procedure Laterality Date   BACK SURGERY  10/2009   ESI for surgery for lumbar spinal stenosis   BIOPSY  11/24/2020   Procedure: BIOPSY;  Surgeon: Milus Banister, MD;  Location: [facility];  Service: Endoscopy;;   clubbed toe repair  2004   COLONOSCOPY  08/15/2011   ESOPHAGOGASTRODUODENOSCOPY (EGD) WITH PROPOFOL N/A 11/24/2020   Procedure: ESOPHAGOGASTRODUODENOSCOPY (EGD) WITH PROPOFOL;  Surgeon: Milus Banister, MD;  Location: [facility];  Service: Endoscopy;  Laterality: N/A;   HIATAL HERNIA REPAIR N/A 11/28/2020   Procedure: Gastrostomy tube placement;  Surgeon: Ralene Ok, MD;  Location: [facility];  Service: General;  Laterality: N/A;  57 MINUTES   INGUINAL HERNIA REPAIR Right 11/26/2004   Dr Rebekah Chesterfield - open w mesh   REPLACEMENT TOTAL KNEE BILATERAL  2007, 2008   TONSILLECTOMY AND ADENOIDECTOMY  1939   X-STOP IMPLANTATION         Family History  Problem Relation Age of Onset   Alcoholism Father    Liver disease Father    Colon cancer Neg Hx      Social History   Tobacco Use   Smoking status: Former    Types: Cigarettes   Smokeless tobacco: Never   Tobacco comments:    quit in 1960s  Vaping Use   Vaping Use: Never used  Substance Use Topics   Alcohol use: Yes    Alcohol/week: 14.0 standard drinks    Types: 14 Standard drinks or equivalent per week    Comment: couple of drinks of scotch every other day   Drug use: No    Home Medications Prior to Admission medications   Medication Sig Start Date End Date Taking? Authorizing Provider  acetaminophen (TYLENOL) 500 MG tablet Take 1,000 mg by mouth every 8 (eight) hours.    [provider]  antiseptic oral rinse (BIOTENE) LIQD 15 mLs by Mouth Rinse route 4 (four) times daily as needed for dry mouth.    [provider]  bisacodyl (DULCOLAX) 10 MG suppository Place 10 mg rectally as needed for moderate constipation.    [provider]  cholestyramine (QUESTRAN) 4 g packet Take 1 packet (4 g total) by mouth 2 (two) times daily. 12/27/20   Royal Hawthorn, NP  iron polysaccharides (NIFEREX) 150 MG capsule Take 150 mg by mouth as directed. On Monday, Tuesday, Thursday, and Saturday    [provider]  lactose free nutrition (BOOST) LIQD Take 237 mLs by mouth 2 (two) times daily as needed.    [provider]  levothyroxine (SYNTHROID) 25 MCG tablet Take 1 tablet (25 mcg total) by mouth daily before breakfast. 04/17/20   Reed, Tiffany L, DO  loperamide (IMODIUM) 2 MG capsule Take 2 mg by mouth every 8 (eight) hours as needed for diarrhea or loose stools.    [provider]  Melatonin 5 MG CAPS Take 1 capsule (5 mg total) by mouth at bedtime. 07/04/20   Reed, Tiffany L, DO  ondansetron (ZOFRAN) 4 MG tablet Take 4 mg by mouth every 6 (six) hours as needed for nausea or vomiting.    [provider]  pantoprazole (PROTONIX) 40 MG tablet Take 40 mg by mouth daily.    [provider]  Polyethyl Glycol-Propyl Glycol  (SYSTANE) 0.4-0.3 % SOLN Two drops both eyes three times daily as needed for dry itchy eyes.    [provider]  QUEtiapine (SEROQUEL) 25 MG tablet Take 12.5 mg by mouth at bedtime.    [provider]  saccharomyces boulardii (FLORASTOR) 250 MG capsule Take 250 mg by mouth 2 (two) times daily.    [provider]  tamsulosin (FLOMAX) 0.4 MG CAPS capsule Take 1 capsule (0.4 mg total) by mouth daily. 11/30/20   [place] Donnamarie Poag, DO  triamcinolone cream (KENALOG) 0.1 % Apply 1 application topically as needed. 02/25/19   [provider]    Allergies    Amrix [cyclobenzaprine] and Shellfish allergy  Review  of Systems   Review of Systems  Constitutional:  Negative for fever.  HENT:  Negative for trouble swallowing.   Eyes:  Negative for visual disturbance.  Respiratory:  Negative for shortness of breath.   Cardiovascular:  Negative for chest pain.  Gastrointestinal:  Negative for abdominal pain.  Genitourinary:  Negative for dysuria.  Musculoskeletal:  Negative for neck pain.  Skin:  Positive for wound.  Neurological:  Positive for headaches.   Physical Exam Updated Vital Signs BP (!) 180/78 (BP Location: Right Arm)   Pulse (!) 57   Temp 98 F (36.7 C) (Oral)   Resp 14   SpO2 100%   Physical Exam Vitals and nursing note reviewed.  Constitutional:      Appearance: Normal appearance. He is well-developed.  HENT:     Head: Normocephalic.     Comments: He has some swelling to the crown in the occiput of his head.  There are 2 approximately 2 cm lacerations.  Minimal active bleeding. Eyes:     Conjunctiva/sclera: Conjunctivae normal.  Cardiovascular:     Rate and Rhythm: Normal rate and regular rhythm.     Heart sounds: No murmur heard. Pulmonary:     Effort: Pulmonary effort is normal. No respiratory distress.     Breath sounds: Normal breath sounds.  Abdominal:     Palpations: Abdomen is soft.     Tenderness: There is no abdominal tenderness.   Musculoskeletal:        General: No deformity or signs of injury. Normal range of motion.     Cervical back: Neck supple.  Skin:    General: Skin is warm and dry.     Capillary Refill: Capillary refill takes less than 2 seconds.  Neurological:     General: No focal deficit present.     Mental Status: He is alert.    ED Results / Procedures / Treatments   Labs (all labs ordered are listed, but only abnormal results are displayed) Labs Reviewed - No data to display  EKG None  Radiology CT Head Wo Contrast  Result Date: 04/04/2021 CLINICAL DATA:  Trauma EXAM: CT HEAD WITHOUT CONTRAST TECHNIQUE: Contiguous axial images were obtained from the base of the skull through the vertex without intravenous contrast. COMPARISON:  Head CT 02/26/2014 FINDINGS: Brain: There is no evidence of acute intracranial hemorrhage, extra-axial fluid collection, or infarct. There is moderate parenchymal volume loss with enlargement of the ventricular system and extra-axial CSF spaces. No mass lesion is identified.  There is no midline shift. Vascular: No hyperdense vessel or unexpected calcification. Skull: Normal. Negative for fracture or focal lesion. Sinuses/Orbits: There is mild mucosal thickening in the maxillary sinuses, incompletely imaged. Bilateral lens implants are noted. The globes and orbits are otherwise unremarkable. Other: None. IMPRESSION: 1. No acute intracranial hemorrhage or calvarial fracture. 2. Moderate parenchymal volume loss with enlargement of the ventricles and CSF spaces, similar to 2015. Electronically Signed   By: Valetta Mole M.D.   On: 04/04/2021 14:32    Procedures .Marland KitchenLaceration Repair  Date/Time: 04/04/2021 2:11 PM Performed by: Hayden Rasmussen, MD Authorized by: Hayden Rasmussen, MD   Consent:    Consent obtained:  Verbal   Consent given by:  Patient   Risks discussed:  Infection, pain, poor cosmetic result, poor wound healing and retained foreign body   Alternatives  discussed:  No treatment and delayed treatment Universal protocol:    Procedure explained and questions answered to patient or proxy's satisfaction: yes  Patient identity confirmed:  Verbally with patient Laceration details:    Location:  Scalp   Scalp location:  Crown   Length (cm):  3 Treatment:    Area cleansed with:  Saline   Irrigation solution:  Sterile saline Skin repair:    Repair method:  Staples   Number of staples:  3 Approximation:    Approximation:  Close Repair type:    Repair type:  Simple Post-procedure details:    Dressing:  Open (no dressing)   Procedure completion:  Tolerated well, no immediate complications .Marland KitchenLaceration Repair  Date/Time: 04/04/2021 2:13 PM Performed by: Hayden Rasmussen, MD Authorized by: Hayden Rasmussen, MD   Consent:    Consent obtained:  Verbal   Consent given by:  Patient   Risks discussed:  Infection, pain, poor cosmetic result, poor wound healing and retained foreign body   Alternatives discussed:  No treatment and delayed treatment Universal protocol:    Procedure explained and questions answered to patient or proxy's satisfaction: yes     Patient identity confirmed:  Verbally with patient Anesthesia:    Anesthesia method:  Local infiltration   Local anesthetic:  Lidocaine 2% WITH epi Laceration details:    Location:  Scalp   Scalp location:  L parietal   Length (cm):  2 Treatment:    Area cleansed with:  Saline Skin repair:    Repair method:  Staples   Number of staples:  1 Approximation:    Approximation:  Close Repair type:    Repair type:  Simple Post-procedure details:    Dressing:  Open (no dressing)   Procedure completion:  Tolerated well, no immediate complications   Medications Ordered in ED Medications  lidocaine-EPINEPHrine (XYLOCAINE W/EPI) 2 %-1:200000 (PF) injection 20 mL (has no administration in time range)  Tdap (BOOSTRIX) injection 0.5 mL (has no administration in time range)    ED Course   I have reviewed the triage vital signs and the nursing notes.  Pertinent labs & imaging results that were available during my care of the patient were reviewed by me and considered in my medical decision making (see chart for details).    MDM Rules/Calculators/A&P                          87 year old male not on anticoagulation here after mechanical fall at home.  He has 2 small lacerations to his scalp.  Head CT done to exclude skull fracture or intracranial bleed.  CT interpreted by me as atrophy but no acute bleed.  Patient's wounds were repaired and he is awaiting ambulance transfer back to his facility.  Return instructions discussed   Final Clinical Impression(s) / ED Diagnoses Final diagnoses:  Fall, initial encounter  Traumatic injury of head, initial encounter  Laceration of scalp, initial encounter    Rx / DC Orders ED Discharge Orders     None        Hayden Rasmussen, MD 04/04/21 1712

## 2021-04-04 NOTE — ED Notes (Signed)
Wound to back of head cleansed after MD placed staples.

## 2021-04-04 NOTE — Discharge Instructions (Signed)
You are seen in the emergency department for evaluation of injuries from a fall.  You had 2 lacerations on your scalp that were stapled and these will need to be removed in about 7 days.  You had a CAT scan of your head that did not show any significant findings.  You may shower.

## 2021-04-04 NOTE — ED Triage Notes (Signed)
Patient was trying to get to a chair when he lost his footing and fell hitting head.  Lacs noted to occiptal region.  Denies LOC

## 2021-04-08 ENCOUNTER — Encounter: Payer: Self-pay | Admitting: Adult Health

## 2021-04-08 ENCOUNTER — Non-Acute Institutional Stay (SKILLED_NURSING_FACILITY): Payer: Medicare Other | Admitting: Adult Health

## 2021-04-08 DIAGNOSIS — W19XXXA Unspecified fall, initial encounter: Secondary | ICD-10-CM

## 2021-04-08 DIAGNOSIS — R001 Bradycardia, unspecified: Secondary | ICD-10-CM

## 2021-04-08 DIAGNOSIS — U071 COVID-19: Secondary | ICD-10-CM | POA: Diagnosis not present

## 2021-04-08 DIAGNOSIS — S0101XA Laceration without foreign body of scalp, initial encounter: Secondary | ICD-10-CM

## 2021-04-08 DIAGNOSIS — D649 Anemia, unspecified: Secondary | ICD-10-CM | POA: Diagnosis not present

## 2021-04-08 DIAGNOSIS — R059 Cough, unspecified: Secondary | ICD-10-CM

## 2021-04-08 LAB — COMPREHENSIVE METABOLIC PANEL
Albumin: 3 — AB (ref 3.5–5.0)
Calcium: 7.8 — AB (ref 8.7–10.7)
Globulin: 2.7

## 2021-04-08 LAB — BASIC METABOLIC PANEL
BUN: 17 (ref 4–21)
CO2: 24 — AB (ref 13–22)
Chloride: 101 (ref 99–108)
Creatinine: 1.4 — AB (ref 0.6–1.3)
Glucose: 83
Potassium: 3.8 (ref 3.4–5.3)
Sodium: 134 — AB (ref 137–147)

## 2021-04-08 LAB — CBC AND DIFFERENTIAL
HCT: 27 — AB (ref 41–53)
Hemoglobin: 8.3 — AB (ref 13.5–17.5)
Platelets: 221 (ref 150–399)
WBC: 5.5

## 2021-04-08 LAB — CBC: RBC: 3.42 — AB (ref 3.87–5.11)

## 2021-04-08 LAB — TSH: TSH: 5.07 (ref 0.41–5.90)

## 2021-04-08 LAB — HEPATIC FUNCTION PANEL
ALT: 7 — AB (ref 10–40)
AST: 16 (ref 14–40)
Alkaline Phosphatase: 75 (ref 25–125)
Bilirubin, Total: 0.2

## 2021-04-08 NOTE — Progress Notes (Addendum)
Location:  [facility] of Service:  SNF (31) Provider:   Cindi Carbon, [place] [phone]   [name], MD  Patient Care Team: [name], MD as PCP - General (Internal Medicine)  Extended Emergency Contact Information Primary Emergency Contact: Rudean Hitt Address: [place], [place] [address] [name] Phone: [phone] Mobile Phone: [phone] Relation: Daughter Secondary Emergency Contact: Simms,Carol Address: [address]          [place], [place] [address] [place] Phone: [phone] Work Phone: [phone] Relation: Other  Code Status:  DNR Goals of care: Advanced Directive information Advanced Directives 04/04/2021  Does Patient Have a Medical Advance Directive? No  Type of Advance Directive -  Does patient want to make changes to medical advance directive? -  Copy of [place] in Chart? -  Would patient like information on creating a medical advance directive? No - Patient declined  Pre-existing out of facility DNR order (yellow form or pink MOST form) -     Chief Complaint  Patient presents with   Acute Visit    Cough, low HR    HPI:  Pt is a 87 y.o. male seen today for an acute visit for cough. He resides in skilled care with a PMH significant for vascular dementia, GIB, GERD IBS, HTN, hypothyroid, scoliosis wit hback pain,  CKD, sinus brady, IDA.  He fell on 8/18 due to walking without his walker and hit the back of his scalp. He was seen in the ED and three staples were placed in the posterior scalp.  Head CT was done which showed no acute bleed or fracture.   SBAR given to be 8/22 stating that on 8/21 he had a HR in the 30's in the middle of the night while in bed and was asymptomatic. The day prior he had a dry cough and "did not feel like himself".  He has not had a fever or sputum production. No  sob, headache, or chest pain.  Since his fall he is weaker a needing to use the wheelchair more. The nurses have been encouraging it to prevent falls as he is already very frail and weak at baseline. He does have a hx of sinus brady. He has been using robitussin. NO sick contacts. No difficulty swallowing. No other covid symptoms.    Past Medical History:  Diagnosis Date   Acute kidney injury ([place]) 03/06/14   Anemia, unspecified 03/06/14   Arthritis    BPH (benign prostatic hyperplasia)    Cerebral atrophy ([place]) 03/06/14   Cerebrovascular disease 03/06/14   Degenerative disc disease, lumbar 03/06/14   Diverticula, bladder acquired    Edema 03/06/14   GERD (gastroesophageal reflux disease)    H/O: GI bleed 1986   Hiatal hernia    History of fall 03/06/14   Hypercalcemia 03/06/14   Hyperlipidemia    Hypertension    Impotence    LFT elevation 03/06/14   Lumbago 03/06/14   Mallory-Weiss tear 1986   Osteoporosis    Rhabdomyolysis 03/06/14   Scoliosis of cervical spine 03/06/14   Scoliosis of lumbar spine 03/06/14   Shingles    Spermatocele    bilateral, left greater than right   Troponin level elevated 03/06/14   Weak 03/06/14   Past Surgical History:  Procedure Laterality Date   BACK SURGERY  10/2009  ESI for surgery for lumbar spinal stenosis   BIOPSY  11/24/2020   Procedure: BIOPSY;  Surgeon: Milus Banister, MD;  Location: [facility];  Service: Endoscopy;;   clubbed toe repair  2004   COLONOSCOPY  08/15/2011   ESOPHAGOGASTRODUODENOSCOPY (EGD) WITH PROPOFOL N/A 11/24/2020   Procedure: ESOPHAGOGASTRODUODENOSCOPY (EGD) WITH PROPOFOL;  Surgeon: Milus Banister, MD;  Location: [facility];  Service: Endoscopy;  Laterality: N/A;   HIATAL HERNIA REPAIR N/A 11/28/2020   Procedure: Gastrostomy tube placement;  Surgeon: Ralene Ok, MD;  Location: [facility];  Service: General;  Laterality: N/A;  90 MINUTES   INGUINAL HERNIA REPAIR Right 11/26/2004   Dr Rebekah [place] - open w mesh   REPLACEMENT  TOTAL KNEE BILATERAL  2007, 2008   TONSILLECTOMY AND ADENOIDECTOMY  1939   X-STOP IMPLANTATION      Allergies  Allergen Reactions   Amrix [Cyclobenzaprine]    Shellfish Allergy     Outpatient Encounter Medications as of 04/08/2021  Medication Sig   acetaminophen (TYLENOL) 500 MG tablet Take 1,000 mg by mouth every 8 (eight) hours.   antiseptic oral rinse (BIOTENE) LIQD 15 mLs by Mouth Rinse route 4 (four) times daily as needed for dry mouth.   bisacodyl (DULCOLAX) 10 MG suppository Place 10 mg rectally as needed for moderate constipation.   cholestyramine (QUESTRAN) 4 g packet Take 1 packet (4 g total) by mouth 2 (two) times daily.   iron polysaccharides (NIFEREX) 150 MG capsule Take 150 mg by mouth as directed. On Monday, Tuesday, Thursday, and Saturday   lactose free nutrition (BOOST) LIQD Take 237 mLs by mouth 2 (two) times daily as needed.   levothyroxine (SYNTHROID) 25 MCG tablet Take 1 tablet (25 mcg total) by mouth daily before breakfast.   loperamide (IMODIUM) 2 MG capsule Take 2 mg by mouth every 8 (eight) hours as needed for diarrhea or loose stools.   Melatonin 5 MG CAPS Take 1 capsule (5 mg total) by mouth at bedtime.   ondansetron (ZOFRAN) 4 MG tablet Take 4 mg by mouth every 6 (six) hours as needed for nausea or vomiting.   pantoprazole (PROTONIX) 40 MG tablet Take 40 mg by mouth daily.   Polyethyl Glycol-Propyl Glycol (SYSTANE) 0.4-0.3 % SOLN Two drops both eyes three times daily as needed for dry itchy eyes.   QUEtiapine (SEROQUEL) 25 MG tablet Take 12.5 mg by mouth at bedtime.   saccharomyces boulardii (FLORASTOR) 250 MG capsule Take 250 mg by mouth 2 (two) times daily.   tamsulosin (FLOMAX) 0.4 MG CAPS capsule Take 1 capsule (0.4 mg total) by mouth daily.   triamcinolone cream (KENALOG) 0.1 % Apply 1 application topically as needed.   No facility-administered encounter medications on file as of 04/08/2021.    Review of Systems  Constitutional:  Negative for activity  change, appetite change, chills, diaphoresis, fatigue, fever and unexpected weight change.  HENT:  Positive for hearing loss and voice change (a little hoarse). Negative for congestion, facial swelling, mouth sores, postnasal drip, rhinorrhea, sinus pressure, sinus pain, sore throat, tinnitus and trouble swallowing.   Respiratory:  Positive for cough. Negative for choking, shortness of breath, wheezing and stridor.   Cardiovascular:  Negative for chest pain, palpitations and leg swelling.  Gastrointestinal:  Negative for abdominal distention, abdominal pain, constipation and diarrhea.  Genitourinary:  Negative for difficulty urinating and dysuria.  Musculoskeletal:  Positive for gait problem. Negative for arthralgias, back pain, joint swelling and myalgias.  Skin:  Positive for wound.  Neurological:  Positive  for weakness. Negative for dizziness, seizures, syncope, facial asymmetry, speech difficulty and headaches.  Hematological:  Negative for adenopathy. Does not bruise/bleed easily.  Psychiatric/Behavioral:  Positive for confusion. Negative for agitation and behavioral problems.    Immunization History  Administered Date(s) Administered   Influenza, High Dose Seasonal PF 06/08/2020   Moderna Sars-Covid-2 Vaccination 09/09/2019, 09/27/2019   Td 09/02/2007   Tdap 11/11/2017, 04/04/2021   Pertinent  Health Maintenance Due  Topic Date Due   PNA vac Low Risk Adult (1 of 2 - PCV13) Never done   DEXA SCAN  12/30/2014   INFLUENZA VACCINE  Discontinued   Fall Risk  02/29/2020 10/19/2019 06/13/2019 03/16/2019 05/12/2018  Falls in the past year? 0 0 0 0 No  Number falls in past yr: 0 0 0 0 -  Injury with Fall? 0 0 0 0 -  Risk for fall due to : - - Impaired balance/gait - -  Follow up - - Falls evaluation completed - -   Functional Status Survey:    Vitals:   04/08/21 1131  BP: 98/62  Pulse: (!) 58  Resp: 20  Temp: 98.1 F (36.7 C)  SpO2: 95%   There is no height or weight on file to  calculate BMI. Physical Exam Vitals and nursing note reviewed.  Constitutional:      General: He is not in acute distress.    Appearance: He is not diaphoretic.  HENT:     Head: Normocephalic and atraumatic.     Nose: Nose normal. No congestion.     Mouth/Throat:     Mouth: Mucous membranes are moist.     Pharynx: Oropharynx is clear. Posterior oropharyngeal erythema present.  Eyes:     General:        Right eye: No discharge.        Left eye: No discharge.     Conjunctiva/sclera: Conjunctivae normal.     Pupils: Pupils are equal, round, and reactive to light.  Neck:     Thyroid: No thyromegaly.     Vascular: No JVD.     Trachea: No tracheal deviation.  Cardiovascular:     Rate and Rhythm: Normal rate and regular rhythm.     Heart sounds: No murmur heard. Pulmonary:     Effort: Pulmonary effort is normal. No respiratory distress.     Breath sounds: No wheezing.     Comments: Decrease right base due to spine curvature Abdominal:     General: Bowel sounds are normal. There is no distension.     Palpations: Abdomen is soft.     Tenderness: There is no abdominal tenderness.  Musculoskeletal:     Right lower leg: No edema.     Left lower leg: No edema.  Lymphadenopathy:     Cervical: No cervical adenopathy.  Skin:    General: Skin is warm and dry.     Comments: Posterior scalp with three staples. No redness or drainage.   Neurological:     General: No focal deficit present.     Mental Status: He is alert and oriented to person, place, and time. Mental status is at baseline.     Cranial Nerves: No cranial nerve deficit.  Psychiatric:        Mood and Affect: Mood normal.    Labs reviewed: Recent Labs    11/26/20 0332 11/27/20 0327 11/28/20 0340 11/29/20 0338 12/06/20 0000 12/10/20 0000 12/26/20 0000 02/15/21 0000  NA 134* 134* 132* 136   < > 133* 136*  136*  K 3.9 4.0 3.9 4.3   < > 3.8 4.3 4.1  CL 109 106 102 105   < > 97* 100 104  CO2 19* 21* 20* 22   < > 23*  22 21  GLUCOSE 103* 97 98 132*  --   --   --   --   BUN 24* '21 17 20   '$ < > '19 21 18  '$ CREATININE 1.38* 1.29* 1.49* 1.49*   < > 1.3 1.4* 1.3  CALCIUM 7.7* 7.9* 7.7* 8.1*  --  8.2* 8.8 8.0*  MG 1.7 2.0 1.8 2.0  --   --   --   --   PHOS 2.6 3.3 3.0  --   --   --   --   --    < > = values in this interval not displayed.   Recent Labs    11/27/20 0327 11/28/20 0340 11/29/20 0338 12/06/20 0000 12/10/20 0000 02/15/21 0000  AST '16 15 16 15 17 '$ 13*  ALT '14 12 12 11 15 '$ 5*  ALKPHOS 60 64 68 86 97 77  BILITOT 0.9 0.8 0.7  --   --   --   PROT 5.6* 5.7* 6.4*  --   --   --   ALBUMIN 2.5* 2.6* 2.7*  --  3.0* 3.2*   Recent Labs    11/26/20 0332 11/27/20 0327 11/28/20 0340 11/29/20 0338 12/06/20 0000 01/03/21 0000 01/14/21 0000 03/05/21 0000  WBC 9.2 8.4 9.2 12.1*   < > 5.3 9.1 6.2  NEUTROABS 5.7 5.1 5.8  --   --   --   --   --   HGB 8.1* 7.6*  8.4* 8.4* 9.4*   < > 8.0* 8.7* 8.9*  HCT 24.8* 23.8*  26.1* 26.9* 29.7*   < > 24* 27* 27*  MCV 90.8 91.3 93.4 92.2  --   --   --   --   PLT 240 265 284 262   < > 438* 443* 398   < > = values in this interval not displayed.   Lab Results  Component Value Date   TSH 3.249 11/26/2020   Lab Results  Component Value Date   HGBA1C 5.6 11/26/2020   No results found for: CHOL, HDL, LDLCALC, LDLDIRECT, TRIG, CHOLHDL  Significant Diagnostic Results in last 30 days:  CT Head Wo Contrast  Result Date: 04/04/2021 CLINICAL DATA:  Trauma EXAM: CT HEAD WITHOUT CONTRAST TECHNIQUE: Contiguous axial images were obtained from the base of the skull through the vertex without intravenous contrast. COMPARISON:  Head CT 02/26/2014 FINDINGS: Brain: There is no evidence of acute intracranial hemorrhage, extra-axial fluid collection, or infarct. There is moderate parenchymal volume loss with enlargement of the ventricular system and extra-axial CSF spaces. No mass lesion is identified.  There is no midline shift. Vascular: No hyperdense vessel or unexpected  calcification. Skull: Normal. Negative for fracture or focal lesion. Sinuses/Orbits: There is mild mucosal thickening in the maxillary sinuses, incompletely imaged. Bilateral lens implants are noted. The globes and orbits are otherwise unremarkable. Other: None. IMPRESSION: 1. No acute intracranial hemorrhage or calvarial fracture. 2. Moderate parenchymal volume loss with enlargement of the ventricles and CSF spaces, similar to 2015. Electronically Signed   By: Valetta Mole M.D.   On: 04/04/2021 14:32    Assessment/Plan 1. Bradycardia HR 58-80 during my visit with no feelings of dizziness Recommend rising slowly Labs pending Discussed with his daughter and HCPOA and due to his goals of care no  further workup would be warranted at this time  Will d/c seroquel due to lack of benefit and risk of QT interval prolongation   2. Cough Mild with no fever, sputum production, or abnormal lung sounds CBC pending Check rapid covid If persisting or not improving will obtain CXR (dry cough only present for 2 days per staff)  3. Fall, initial encounter Mechanical due to attempting to ambulate without a walker  Needs to use a walker at all times and assistance with transfer provided by staff   4. Laceration of scalp without foreign body, initial encounter Healing, staples in place Need at least 7 days before removal  Tdap given in ER  Addendum nurse called to report pt is positive for covid, paxlovid ordered per pharmacy and 10 days of isolation   Family/ staff Communication: discussed with his daughter Ms. Ferguson and left a message with his HCPOA Ms Lina Sar  Labs/tests ordered:  CBC BMP TSH

## 2021-04-24 ENCOUNTER — Encounter: Payer: Self-pay | Admitting: Internal Medicine

## 2021-04-25 DIAGNOSIS — M6389 Disorders of muscle in diseases classified elsewhere, multiple sites: Secondary | ICD-10-CM | POA: Diagnosis not present

## 2021-04-25 DIAGNOSIS — Z8616 Personal history of COVID-19: Secondary | ICD-10-CM | POA: Diagnosis not present

## 2021-04-25 DIAGNOSIS — F0150 Vascular dementia without behavioral disturbance: Secondary | ICD-10-CM | POA: Diagnosis not present

## 2021-04-25 DIAGNOSIS — R278 Other lack of coordination: Secondary | ICD-10-CM | POA: Diagnosis not present

## 2021-04-25 DIAGNOSIS — R293 Abnormal posture: Secondary | ICD-10-CM | POA: Diagnosis not present

## 2021-04-30 ENCOUNTER — Non-Acute Institutional Stay (SKILLED_NURSING_FACILITY): Payer: Medicare Other | Admitting: Orthopedic Surgery

## 2021-04-30 ENCOUNTER — Encounter: Payer: Self-pay | Admitting: Orthopedic Surgery

## 2021-04-30 DIAGNOSIS — D500 Iron deficiency anemia secondary to blood loss (chronic): Secondary | ICD-10-CM

## 2021-04-30 DIAGNOSIS — Z66 Do not resuscitate: Secondary | ICD-10-CM | POA: Diagnosis not present

## 2021-04-30 DIAGNOSIS — K257 Chronic gastric ulcer without hemorrhage or perforation: Secondary | ICD-10-CM

## 2021-04-30 DIAGNOSIS — H6123 Impacted cerumen, bilateral: Secondary | ICD-10-CM | POA: Diagnosis not present

## 2021-04-30 DIAGNOSIS — K219 Gastro-esophageal reflux disease without esophagitis: Secondary | ICD-10-CM

## 2021-04-30 DIAGNOSIS — N1832 Chronic kidney disease, stage 3b: Secondary | ICD-10-CM | POA: Diagnosis not present

## 2021-04-30 DIAGNOSIS — F0150 Vascular dementia without behavioral disturbance: Secondary | ICD-10-CM

## 2021-04-30 DIAGNOSIS — R2681 Unsteadiness on feet: Secondary | ICD-10-CM | POA: Diagnosis not present

## 2021-04-30 DIAGNOSIS — R001 Bradycardia, unspecified: Secondary | ICD-10-CM | POA: Diagnosis not present

## 2021-04-30 DIAGNOSIS — E440 Moderate protein-calorie malnutrition: Secondary | ICD-10-CM | POA: Diagnosis not present

## 2021-04-30 DIAGNOSIS — R339 Retention of urine, unspecified: Secondary | ICD-10-CM

## 2021-04-30 DIAGNOSIS — R278 Other lack of coordination: Secondary | ICD-10-CM | POA: Diagnosis not present

## 2021-04-30 DIAGNOSIS — M6281 Muscle weakness (generalized): Secondary | ICD-10-CM | POA: Diagnosis not present

## 2021-04-30 DIAGNOSIS — M6389 Disorders of muscle in diseases classified elsewhere, multiple sites: Secondary | ICD-10-CM | POA: Diagnosis not present

## 2021-04-30 DIAGNOSIS — Z8616 Personal history of COVID-19: Secondary | ICD-10-CM | POA: Diagnosis not present

## 2021-04-30 DIAGNOSIS — R2689 Other abnormalities of gait and mobility: Secondary | ICD-10-CM | POA: Diagnosis not present

## 2021-04-30 NOTE — Progress Notes (Signed)
Location:  [facility] Room Number: [id number] Place of Service:  SNF (31) Provider:  Yvonna Alanis, NP   Patient Care Team: [name], MD as PCP - General (Internal Medicine)  Extended Emergency Contact Information Primary Emergency Contact: Anselmo Pickler F Address: [place], [place] [address] [name] of [place] Phone: [phone] Mobile Phone: [phone] Relation: Daughter Secondary Emergency Contact: Simms,Carol Address: [address]          [place],  [address] [place] Phone: [phone] Work Phone: [phone] Relation: Other  Code Status:  DNR Goals of care: Advanced Directive information Advanced Directives 04/30/2021  Does Patient Have a Medical Advance Directive? Yes  Type of Paramedic of [place];Living will;Out of facility DNR (pink MOST or yellow form)  Does patient want to make changes to medical advance directive? No - Patient declined  Copy of [place] in Chart? Yes - validated most recent copy scanned in chart (See row information)  Would patient like information on creating a medical advance directive? -  Pre-existing out of facility DNR order (yellow form or pink MOST form) Yellow form placed in chart (order not valid for inpatient use);Pink MOST form placed in chart (order not valid for inpatient use)     Chief Complaint  Patient presents with   Medical Management of Chronic Issues    Routine visit. Discuss need for Dexa, shingrix, covid # 3, and PNA vaccine or exclude.     HPI:  Pt is a 87 y.o. male seen today for medical management of chronic diseases.    He currently resides on the skilled nursing unit at [facility]. Past medical history includes: hypertension, cameron ulcer, GERD, hypothyroidism, CKD stage III, chronic back pain, cognitive impairment, and generalized weakness.   [name]-  hospitalized 11/2020 with GI bleed and hiatal hernia, he had gastric reduction and Gastropexy. Questran 4g packet bid. Florastor daily, protonix 40 mg daily.  Hgb 8.3 04/08/2021. Eating and drinking well. Plans to follow up with [facility] 09/14 about need for Gtube.  Bradycardia-HR 30's about a month ago. He was taken to the ED for fall and laceration to scalp. Family does not want further workup. HR 50-70's in past week. He has been advised to rise slowly.  Covid 19- tested positive 04/08/2021. Symptoms included dry cough, fatigue and weakness. He was also given Paxlovid. Today he denies any long term symptoms.  Urinary retention/hydronephrosis- indwelling foley in place. Flomax daily.  Iron deficiency anemia- Hgb 8.3 04/08/2021. Remains on Niferex 4 days a week.  GERD- denies reflux. Protonix daily.  Vascular dementia- no recent behavioral outbursts, Seroquel discontinued 08/23. Very pleasant today during encounter, MMSE 24/30 12/04/2020.   Fall 08/18 with laceration to scalp. Ct head with no acute intracranial hemorrhage or calvarial fracture.   Ambulation continues to decline. He is able to stand and transfer with one person assist. Ambulates with wheelchair.   Recent weights:   09/02- 128.4 lbs  08/01- 135.8 lbs  07/01- 134 lbs  Nurse does not report any concerns, vitals stable.          Past Medical History:  Diagnosis Date   Acute kidney injury ([place]) 03/06/14   Anemia, unspecified 03/06/14   Arthritis    BPH (benign prostatic hyperplasia)    Cerebral atrophy ([place]) 03/06/14   Cerebrovascular disease 03/06/14   Degenerative disc disease, lumbar 03/06/14  Diverticula, bladder acquired    Edema 03/06/14   GERD (gastroesophageal reflux disease)    H/O: GI bleed 1986   Hiatal hernia    History of fall 03/06/14   Hypercalcemia 03/06/14   Hyperlipidemia    Hypertension    Impotence    LFT elevation 03/06/14   Lumbago 03/06/14   Mallory-Weiss tear 1986    Osteoporosis    Rhabdomyolysis 03/06/14   Scoliosis of cervical spine 03/06/14   Scoliosis of lumbar spine 03/06/14   Shingles    Spermatocele    bilateral, left greater than right   Troponin level elevated 03/06/14   Weak 03/06/14   Past Surgical History:  Procedure Laterality Date   BACK SURGERY  10/2009   ESI for surgery for lumbar spinal stenosis   BIOPSY  11/24/2020   Procedure: BIOPSY;  Surgeon: Milus Banister, MD;  Location: [facility];  Service: Endoscopy;;   clubbed toe repair  2004   COLONOSCOPY  08/15/2011   ESOPHAGOGASTRODUODENOSCOPY (EGD) WITH PROPOFOL N/A 11/24/2020   Procedure: ESOPHAGOGASTRODUODENOSCOPY (EGD) WITH PROPOFOL;  Surgeon: Milus Banister, MD;  Location: [facility] ENDOSCOPY;  Service: Endoscopy;  Laterality: N/A;   HIATAL HERNIA REPAIR N/A 11/28/2020   Procedure: Gastrostomy tube placement;  Surgeon: Ralene Ok, MD;  Location: [facility] ORS;  Service: General;  Laterality: N/A;  90 MINUTES   INGUINAL HERNIA REPAIR Right 11/26/2004   Dr Rebekah [place] - open w mesh   REPLACEMENT TOTAL KNEE BILATERAL  2007, 2008   TONSILLECTOMY AND ADENOIDECTOMY  1939   X-STOP IMPLANTATION      Allergies  Allergen Reactions   Amrix [Cyclobenzaprine]    Shellfish Allergy     Outpatient Encounter Medications as of 04/30/2021  Medication Sig   acetaminophen (TYLENOL) 500 MG tablet Take 1,000 mg by mouth every 8 (eight) hours.   antiseptic oral rinse (BIOTENE) LIQD 15 mLs by Mouth Rinse route 4 (four) times daily as needed for dry mouth.   bisacodyl (DULCOLAX) 10 MG suppository Place 10 mg rectally as needed for moderate constipation.   cholestyramine (QUESTRAN) 4 g packet Take 1 packet (4 g total) by mouth 2 (two) times daily.   iron polysaccharides (NIFEREX) 150 MG capsule Take 150 mg by mouth as directed. On Monday, Tuesday, Thursday, and Saturday   lactose free nutrition (BOOST) LIQD Take 237 mLs by mouth daily as needed.   levothyroxine (SYNTHROID) 50 MCG tablet Take 50 mcg by mouth  daily before breakfast.   loperamide (IMODIUM) 2 MG capsule Take 2 mg by mouth every 8 (eight) hours as needed for diarrhea or loose stools.   Melatonin 5 MG CAPS Take 1 capsule (5 mg total) by mouth at bedtime.   ondansetron (ZOFRAN) 4 MG tablet Take 4 mg by mouth every 6 (six) hours as needed for nausea or vomiting.   pantoprazole (PROTONIX) 40 MG tablet Take 40 mg by mouth daily.   Polyethyl Glycol-Propyl Glycol (SYSTANE) 0.4-0.3 % SOLN Two drops both eyes three times daily as needed for dry itchy eyes.   saccharomyces boulardii (FLORASTOR) 250 MG capsule Take 250 mg by mouth 2 (two) times daily.   tamsulosin (FLOMAX) 0.4 MG CAPS capsule Take 1 capsule (0.4 mg total) by mouth daily.   triamcinolone cream (KENALOG) 0.1 % Apply 1 application topically as needed.   [DISCONTINUED] levothyroxine (SYNTHROID) 25 MCG tablet Take 1 tablet (25 mcg total) by mouth daily before breakfast.   [DISCONTINUED] QUEtiapine (SEROQUEL) 25 MG tablet Take 12.5 mg by mouth at bedtime.  No facility-administered encounter medications on file as of 04/30/2021.    Review of Systems  Constitutional:  Negative for activity change, appetite change, fatigue and fever.  HENT:  Positive for hearing loss. Negative for congestion and trouble swallowing.   Eyes:  Negative for visual disturbance.  Respiratory:  Negative for cough, shortness of breath and wheezing.   Cardiovascular:  Positive for leg swelling. Negative for chest pain.  Gastrointestinal:  Negative for abdominal distention, abdominal pain, blood in stool, constipation, diarrhea and nausea.  Genitourinary:  Negative for dysuria, frequency and hematuria.  Musculoskeletal:  Positive for gait problem. Negative for arthralgias and myalgias.  Skin: Negative.   Neurological:  Positive for weakness. Negative for dizziness, light-headedness and headaches.  Psychiatric/Behavioral:  Positive for confusion. Negative for dysphoric mood and self-injury. The patient is not  nervous/anxious.    Immunization History  Administered Date(s) Administered   Influenza, High Dose Seasonal PF 06/08/2020   Moderna Sars-Covid-2 Vaccination 09/09/2019, 09/27/2019   Td 09/02/2007   Tdap 11/11/2017, 04/04/2021   Pertinent  Health Maintenance Due  Topic Date Due   PNA vac Low Risk Adult (1 of 2 - PCV13) Never done   DEXA SCAN  12/30/2014   INFLUENZA VACCINE  Discontinued   Fall Risk  02/29/2020 10/19/2019 06/13/2019 03/16/2019 05/12/2018  Falls in the past year? 0 0 0 0 No  Number falls in past yr: 0 0 0 0 -  Injury with Fall? 0 0 0 0 -  Risk for fall due to : - - Impaired balance/gait - -  Follow up - - Falls evaluation completed - -   Functional Status Survey:    Vitals:   04/30/21 0952  BP: 126/72  Pulse: (!) 58  Resp: 18  Temp: 98.1 F (36.7 C)  SpO2: 94%  Weight: 128 lb 6.4 oz (58.2 kg)  Height: '5\' 4"'$  (1.626 m)   Body mass index is 22.04 kg/m. Physical Exam Vitals reviewed.  Constitutional:      General: He is not in acute distress. HENT:     Head: Normocephalic.     Right Ear: There is impacted cerumen.     Left Ear: There is impacted cerumen.     Nose: Nose normal.     Mouth/Throat:     Mouth: Mucous membranes are moist.  Eyes:     General:        Right eye: No discharge.        Left eye: No discharge.  Neck:     Vascular: No carotid bruit.  Cardiovascular:     Rate and Rhythm: Normal rate and regular rhythm.     Pulses: Normal pulses.     Heart sounds: Normal heart sounds. No murmur heard. Pulmonary:     Effort: Pulmonary effort is normal. No respiratory distress.     Breath sounds: Normal breath sounds. No wheezing.  Abdominal:     General: Abdomen is flat. Bowel sounds are normal. There is no distension.     Palpations: Abdomen is soft.     Tenderness: There is no abdominal tenderness.  Musculoskeletal:     Cervical back: Normal range of motion.     Right lower leg: Edema present.     Left lower leg: Edema present.      Comments: Non-pitting, TED hose present  Lymphadenopathy:     Cervical: No cervical adenopathy.  Skin:    General: Skin is warm and dry.     Capillary Refill: Capillary refill takes less than  2 seconds.  Neurological:     General: No focal deficit present.     Mental Status: He is alert. Mental status is at baseline.     Motor: Weakness present.     Gait: Gait abnormal.     Comments: wheelchair  Psychiatric:        Mood and Affect: Mood normal.        Behavior: Behavior normal.        Cognition and Memory: Memory is impaired.    Labs reviewed: Recent Labs    11/26/20 0332 11/27/20 0327 11/28/20 0340 11/29/20 0338 12/06/20 0000 12/26/20 0000 02/15/21 0000 04/08/21 0000  NA 134* 134* 132* 136   < > 136* 136* 134*  K 3.9 4.0 3.9 4.3   < > 4.3 4.1 3.8  CL 109 106 102 105   < > 100 104 101  CO2 19* 21* 20* 22   < > 22 21 24*  GLUCOSE 103* 97 98 132*  --   --   --   --   BUN 24* '21 17 20   '$ < > '21 18 17  '$ CREATININE 1.38* 1.29* 1.49* 1.49*   < > 1.4* 1.3 1.4*  CALCIUM 7.7* 7.9* 7.7* 8.1*   < > 8.8 8.0* 7.8*  MG 1.7 2.0 1.8 2.0  --   --   --   --   PHOS 2.6 3.3 3.0  --   --   --   --   --    < > = values in this interval not displayed.   Recent Labs    11/27/20 0327 11/28/20 0340 11/29/20 0338 12/06/20 0000 12/10/20 0000 02/15/21 0000 04/08/21 0000  AST '16 15 16   '$ < > 17 13* 16  ALT '14 12 12   '$ < > 15 5* 7*  ALKPHOS 60 64 68   < > 97 77 75  BILITOT 0.9 0.8 0.7  --   --   --   --   PROT 5.6* 5.7* 6.4*  --   --   --   --   ALBUMIN 2.5* 2.6* 2.7*  --  3.0* 3.2* 3.0*   < > = values in this interval not displayed.   Recent Labs    11/26/20 0332 11/27/20 0327 11/28/20 0340 11/29/20 0338 12/06/20 0000 01/14/21 0000 03/05/21 0000 04/08/21 0000  WBC 9.2 8.4 9.2 12.1*   < > 9.1 6.2 5.5  NEUTROABS 5.7 5.1 5.8  --   --   --   --   --   HGB 8.1* 7.6*  8.4* 8.4* 9.4*   < > 8.7* 8.9* 8.3*  HCT 24.8* 23.8*  26.1* 26.9* 29.7*   < > 27* 27* 27*  MCV 90.8 91.3 93.4 92.2   --   --   --   --   PLT 240 265 284 262   < > 443* 398 221   < > = values in this interval not displayed.   Lab Results  Component Value Date   TSH 5.07 04/08/2021   Lab Results  Component Value Date   HGBA1C 5.6 11/26/2020   No results found for: CHOL, HDL, LDLCALC, LDLDIRECT, TRIG, CHOLHDL  Significant Diagnostic Results in last 30 days:  CT Head Wo Contrast  Result Date: 04/04/2021 CLINICAL DATA:  Trauma EXAM: CT HEAD WITHOUT CONTRAST TECHNIQUE: Contiguous axial images were obtained from the base of the skull through the vertex without intravenous contrast. COMPARISON:  Head CT 02/26/2014 FINDINGS: Brain: There  is no evidence of acute intracranial hemorrhage, extra-axial fluid collection, or infarct. There is moderate parenchymal volume loss with enlargement of the ventricular system and extra-axial CSF spaces. No mass lesion is identified.  There is no midline shift. Vascular: No hyperdense vessel or unexpected calcification. Skull: Normal. Negative for fracture or focal lesion. Sinuses/Orbits: There is mild mucosal thickening in the maxillary sinuses, incompletely imaged. Bilateral lens implants are noted. The globes and orbits are otherwise unremarkable. Other: None. IMPRESSION: 1. No acute intracranial hemorrhage or calvarial fracture. 2. Moderate parenchymal volume loss with enlargement of the ventricles and CSF spaces, similar to 2015. Electronically Signed   By: Valetta Mole M.D.   On: 04/04/2021 14:32    Assessment/Plan 1. Do not resuscitate  2. Chronic Cameron ulcer - hgb 8.3 04/08/2021 - plans to f/u with [facility] 09/14 regarding need for Gtube  3. Bradycardia - HR 50-70's  - advised to rise slowly   4. Urinary retention - cont indwelling foley - cont daily and monthly care  5. Iron deficiency anemia due to chronic blood loss - related to Cameron's ulcer - cont Niferex 4x/weekly - cbc/diff   6. Gastroesophageal reflux disease, unspecified whether esophagitis present -  stable with protonix daily   7. Vascular dementia without behavioral disturbance ([place]) - no behavioral outbursts - Seroquel d/c 04/09/2021  8. Stage 3b chronic kidney disease ([place]) - continue to avoid nephrotoxic drugs like NSAIDS and dose adjust medications to be renally excreted - encourage hydration  9. Protein-calorie malnutrition, moderate ([place]) - he has lost 6-7 lbs in last month, suspect due to covid infection - eating well now - cont Boost prn  10. Bilateral impacted cerumen - cannot visualize TM  - initiate debrox protocol and flush ears after     Family/ staff Communication: plan discussed with patient and nurse  Labs/tests ordered: cbc/diff

## 2021-05-01 DIAGNOSIS — R2689 Other abnormalities of gait and mobility: Secondary | ICD-10-CM | POA: Diagnosis not present

## 2021-05-01 DIAGNOSIS — R6889 Other general symptoms and signs: Secondary | ICD-10-CM | POA: Diagnosis not present

## 2021-05-01 DIAGNOSIS — R293 Abnormal posture: Secondary | ICD-10-CM | POA: Diagnosis not present

## 2021-05-01 DIAGNOSIS — F0150 Vascular dementia without behavioral disturbance: Secondary | ICD-10-CM | POA: Diagnosis not present

## 2021-05-01 DIAGNOSIS — R278 Other lack of coordination: Secondary | ICD-10-CM | POA: Diagnosis not present

## 2021-05-01 DIAGNOSIS — M6281 Muscle weakness (generalized): Secondary | ICD-10-CM | POA: Diagnosis not present

## 2021-05-01 DIAGNOSIS — M6389 Disorders of muscle in diseases classified elsewhere, multiple sites: Secondary | ICD-10-CM | POA: Diagnosis not present

## 2021-05-01 DIAGNOSIS — Z8616 Personal history of COVID-19: Secondary | ICD-10-CM | POA: Diagnosis not present

## 2021-05-01 DIAGNOSIS — Z931 Gastrostomy status: Secondary | ICD-10-CM | POA: Diagnosis not present

## 2021-05-01 DIAGNOSIS — D649 Anemia, unspecified: Secondary | ICD-10-CM | POA: Diagnosis not present

## 2021-05-01 DIAGNOSIS — R2681 Unsteadiness on feet: Secondary | ICD-10-CM | POA: Diagnosis not present

## 2021-05-01 LAB — CBC: RBC: 2.93 — AB (ref 3.87–5.11)

## 2021-05-01 LAB — CBC AND DIFFERENTIAL
HCT: 23 — AB (ref 41–53)
Hemoglobin: 7.4 — AB (ref 13.5–17.5)
Platelets: 395 (ref 150–399)
WBC: 6.6

## 2021-05-02 DIAGNOSIS — F0150 Vascular dementia without behavioral disturbance: Secondary | ICD-10-CM | POA: Diagnosis not present

## 2021-05-02 DIAGNOSIS — M6389 Disorders of muscle in diseases classified elsewhere, multiple sites: Secondary | ICD-10-CM | POA: Diagnosis not present

## 2021-05-02 DIAGNOSIS — R278 Other lack of coordination: Secondary | ICD-10-CM | POA: Diagnosis not present

## 2021-05-02 DIAGNOSIS — R293 Abnormal posture: Secondary | ICD-10-CM | POA: Diagnosis not present

## 2021-05-02 DIAGNOSIS — Z8616 Personal history of COVID-19: Secondary | ICD-10-CM | POA: Diagnosis not present

## 2021-05-03 DIAGNOSIS — R293 Abnormal posture: Secondary | ICD-10-CM | POA: Diagnosis not present

## 2021-05-03 DIAGNOSIS — M6389 Disorders of muscle in diseases classified elsewhere, multiple sites: Secondary | ICD-10-CM | POA: Diagnosis not present

## 2021-05-03 DIAGNOSIS — R278 Other lack of coordination: Secondary | ICD-10-CM | POA: Diagnosis not present

## 2021-05-03 DIAGNOSIS — F0150 Vascular dementia without behavioral disturbance: Secondary | ICD-10-CM | POA: Diagnosis not present

## 2021-05-03 DIAGNOSIS — Z8616 Personal history of COVID-19: Secondary | ICD-10-CM | POA: Diagnosis not present

## 2021-05-07 DIAGNOSIS — Z8616 Personal history of COVID-19: Secondary | ICD-10-CM | POA: Diagnosis not present

## 2021-05-07 DIAGNOSIS — M6389 Disorders of muscle in diseases classified elsewhere, multiple sites: Secondary | ICD-10-CM | POA: Diagnosis not present

## 2021-05-07 DIAGNOSIS — F0150 Vascular dementia without behavioral disturbance: Secondary | ICD-10-CM | POA: Diagnosis not present

## 2021-05-07 DIAGNOSIS — R293 Abnormal posture: Secondary | ICD-10-CM | POA: Diagnosis not present

## 2021-05-07 DIAGNOSIS — R2689 Other abnormalities of gait and mobility: Secondary | ICD-10-CM | POA: Diagnosis not present

## 2021-05-07 DIAGNOSIS — R2681 Unsteadiness on feet: Secondary | ICD-10-CM | POA: Diagnosis not present

## 2021-05-07 DIAGNOSIS — M6281 Muscle weakness (generalized): Secondary | ICD-10-CM | POA: Diagnosis not present

## 2021-05-07 DIAGNOSIS — R278 Other lack of coordination: Secondary | ICD-10-CM | POA: Diagnosis not present

## 2021-05-08 DIAGNOSIS — M6281 Muscle weakness (generalized): Secondary | ICD-10-CM | POA: Diagnosis not present

## 2021-05-08 DIAGNOSIS — F0150 Vascular dementia without behavioral disturbance: Secondary | ICD-10-CM | POA: Diagnosis not present

## 2021-05-08 DIAGNOSIS — R278 Other lack of coordination: Secondary | ICD-10-CM | POA: Diagnosis not present

## 2021-05-08 DIAGNOSIS — R293 Abnormal posture: Secondary | ICD-10-CM | POA: Diagnosis not present

## 2021-05-08 DIAGNOSIS — Z8616 Personal history of COVID-19: Secondary | ICD-10-CM | POA: Diagnosis not present

## 2021-05-08 DIAGNOSIS — R2681 Unsteadiness on feet: Secondary | ICD-10-CM | POA: Diagnosis not present

## 2021-05-08 DIAGNOSIS — M6389 Disorders of muscle in diseases classified elsewhere, multiple sites: Secondary | ICD-10-CM | POA: Diagnosis not present

## 2021-05-08 DIAGNOSIS — R2689 Other abnormalities of gait and mobility: Secondary | ICD-10-CM | POA: Diagnosis not present

## 2021-05-09 ENCOUNTER — Encounter: Payer: Self-pay | Admitting: Internal Medicine

## 2021-05-09 DIAGNOSIS — Z8616 Personal history of COVID-19: Secondary | ICD-10-CM | POA: Diagnosis not present

## 2021-05-09 DIAGNOSIS — F0150 Vascular dementia without behavioral disturbance: Secondary | ICD-10-CM | POA: Diagnosis not present

## 2021-05-09 DIAGNOSIS — M6281 Muscle weakness (generalized): Secondary | ICD-10-CM | POA: Diagnosis not present

## 2021-05-09 DIAGNOSIS — D649 Anemia, unspecified: Secondary | ICD-10-CM | POA: Diagnosis not present

## 2021-05-09 DIAGNOSIS — R278 Other lack of coordination: Secondary | ICD-10-CM | POA: Diagnosis not present

## 2021-05-09 DIAGNOSIS — R2681 Unsteadiness on feet: Secondary | ICD-10-CM | POA: Diagnosis not present

## 2021-05-09 DIAGNOSIS — M6389 Disorders of muscle in diseases classified elsewhere, multiple sites: Secondary | ICD-10-CM | POA: Diagnosis not present

## 2021-05-09 DIAGNOSIS — R2689 Other abnormalities of gait and mobility: Secondary | ICD-10-CM | POA: Diagnosis not present

## 2021-05-09 LAB — CBC AND DIFFERENTIAL
HCT: 26 — AB (ref 41–53)
Hemoglobin: 8.3 — AB (ref 13.5–17.5)
Platelets: 322 (ref 150–399)
WBC: 5.9

## 2021-05-09 LAB — IRON,TIBC AND FERRITIN PANEL
Ferritin: 54.72
Iron: 22
TIBC: 247
UIBC: 225

## 2021-05-09 LAB — CBC: RBC: 3.25 — AB (ref 3.87–5.11)

## 2021-05-10 DIAGNOSIS — Z8616 Personal history of COVID-19: Secondary | ICD-10-CM | POA: Diagnosis not present

## 2021-05-10 DIAGNOSIS — F0150 Vascular dementia without behavioral disturbance: Secondary | ICD-10-CM | POA: Diagnosis not present

## 2021-05-10 DIAGNOSIS — R278 Other lack of coordination: Secondary | ICD-10-CM | POA: Diagnosis not present

## 2021-05-10 DIAGNOSIS — R293 Abnormal posture: Secondary | ICD-10-CM | POA: Diagnosis not present

## 2021-05-10 DIAGNOSIS — M6389 Disorders of muscle in diseases classified elsewhere, multiple sites: Secondary | ICD-10-CM | POA: Diagnosis not present

## 2021-05-13 DIAGNOSIS — M6281 Muscle weakness (generalized): Secondary | ICD-10-CM | POA: Diagnosis not present

## 2021-05-13 DIAGNOSIS — R278 Other lack of coordination: Secondary | ICD-10-CM | POA: Diagnosis not present

## 2021-05-13 DIAGNOSIS — R2689 Other abnormalities of gait and mobility: Secondary | ICD-10-CM | POA: Diagnosis not present

## 2021-05-13 DIAGNOSIS — M6389 Disorders of muscle in diseases classified elsewhere, multiple sites: Secondary | ICD-10-CM | POA: Diagnosis not present

## 2021-05-13 DIAGNOSIS — Z8616 Personal history of COVID-19: Secondary | ICD-10-CM | POA: Diagnosis not present

## 2021-05-13 DIAGNOSIS — F0150 Vascular dementia without behavioral disturbance: Secondary | ICD-10-CM | POA: Diagnosis not present

## 2021-05-13 DIAGNOSIS — R2681 Unsteadiness on feet: Secondary | ICD-10-CM | POA: Diagnosis not present

## 2021-05-14 DIAGNOSIS — R293 Abnormal posture: Secondary | ICD-10-CM | POA: Diagnosis not present

## 2021-05-14 DIAGNOSIS — M6389 Disorders of muscle in diseases classified elsewhere, multiple sites: Secondary | ICD-10-CM | POA: Diagnosis not present

## 2021-05-14 DIAGNOSIS — F0150 Vascular dementia without behavioral disturbance: Secondary | ICD-10-CM | POA: Diagnosis not present

## 2021-05-14 DIAGNOSIS — Z8616 Personal history of COVID-19: Secondary | ICD-10-CM | POA: Diagnosis not present

## 2021-05-14 DIAGNOSIS — R278 Other lack of coordination: Secondary | ICD-10-CM | POA: Diagnosis not present

## 2021-05-15 DIAGNOSIS — R278 Other lack of coordination: Secondary | ICD-10-CM | POA: Diagnosis not present

## 2021-05-15 DIAGNOSIS — R2689 Other abnormalities of gait and mobility: Secondary | ICD-10-CM | POA: Diagnosis not present

## 2021-05-15 DIAGNOSIS — M6389 Disorders of muscle in diseases classified elsewhere, multiple sites: Secondary | ICD-10-CM | POA: Diagnosis not present

## 2021-05-15 DIAGNOSIS — R2681 Unsteadiness on feet: Secondary | ICD-10-CM | POA: Diagnosis not present

## 2021-05-15 DIAGNOSIS — M6281 Muscle weakness (generalized): Secondary | ICD-10-CM | POA: Diagnosis not present

## 2021-05-15 DIAGNOSIS — Z8616 Personal history of COVID-19: Secondary | ICD-10-CM | POA: Diagnosis not present

## 2021-05-15 DIAGNOSIS — F0150 Vascular dementia without behavioral disturbance: Secondary | ICD-10-CM | POA: Diagnosis not present

## 2021-05-15 DIAGNOSIS — R293 Abnormal posture: Secondary | ICD-10-CM | POA: Diagnosis not present

## 2021-05-16 ENCOUNTER — Non-Acute Institutional Stay (SKILLED_NURSING_FACILITY): Payer: Medicare Other | Admitting: Adult Health

## 2021-05-16 ENCOUNTER — Encounter: Payer: Self-pay | Admitting: Adult Health

## 2021-05-16 DIAGNOSIS — W19XXXD Unspecified fall, subsequent encounter: Secondary | ICD-10-CM | POA: Diagnosis not present

## 2021-05-16 DIAGNOSIS — S42032A Displaced fracture of lateral end of left clavicle, initial encounter for closed fracture: Secondary | ICD-10-CM | POA: Diagnosis not present

## 2021-05-16 DIAGNOSIS — M25552 Pain in left hip: Secondary | ICD-10-CM

## 2021-05-16 DIAGNOSIS — M25511 Pain in right shoulder: Secondary | ICD-10-CM | POA: Diagnosis not present

## 2021-05-16 DIAGNOSIS — M25551 Pain in right hip: Secondary | ICD-10-CM | POA: Diagnosis not present

## 2021-05-16 DIAGNOSIS — I10 Essential (primary) hypertension: Secondary | ICD-10-CM | POA: Diagnosis not present

## 2021-05-16 DIAGNOSIS — S42025A Nondisplaced fracture of shaft of left clavicle, initial encounter for closed fracture: Secondary | ICD-10-CM | POA: Diagnosis not present

## 2021-05-16 MED ORDER — TRAMADOL HCL 50 MG PO TABS: 25.0000 mg | ORAL_TABLET | Freq: Three times a day (TID) | ORAL | 0 refills | Status: DC | PRN

## 2021-05-16 NOTE — Progress Notes (Addendum)
Location:  [facility] Room Number: 163-W Place of Service:  SNF (31) Provider:   Cindi Carbon, [place] [phone]   [name], MD  Patient Care Team: [name], MD as PCP - General (Internal Medicine)  Extended Emergency Contact Information Primary Emergency Contact: Rudean Hitt Address: [place], [place] [address] [name] [place] Phone: [phone] Mobile Phone: [phone] Relation: Daughter Secondary Emergency Contact: Simms,Carol Address: [address]          [place], [place] [address] [place] Phone: [phone] Work Phone: [phone] Relation: Other  Code Status:  DNR Goals of care: Advanced Directive information Advanced Directives 04/30/2021  Does Patient Have a Medical Advance Directive? Yes  Type of Paramedic of [place];Living will;Out of facility DNR (pink MOST or yellow form)  Does patient want to make changes to medical advance directive? No - Patient declined  Copy of [place] in Chart? Yes - validated most recent copy scanned in chart (See row information)  Would patient like information on creating a medical advance directive? -  Pre-existing out of facility DNR order (yellow form or pink MOST form) Yellow form placed in chart (order not valid for inpatient use);Pink MOST form placed in chart (order not valid for inpatient use)     Chief Complaint  Patient presents with   Shoulder Pain    Left     HPI:  Pt is a 87 y.o. male seen today at [facility] for an acute visit for evaluation of left shoulder and elbow pain after a fall yesterday. Patient has a history of falling and last fall was 08/18 where he sustained a laceration to scalp. Patient sent to ED and CT of head was completed and showed no evidence of intracranial hemorrhage, extra-axial fluid collection or infarct.     PMH includes HTN, GERD, Cameron Ulcer, acute with bleeding and anemia and IBS with diarrhea, hypothyroidism, idiopathic scoliosis, chronic low back pain, CKD stage III and cognitive impairment.   He fell again on 9/28 and complained of left shoulder pain due to getting up without help  Left shoulder X-Ray 05/16/21 Showed Mild displaced acute fracture of distal clavical. Widening of AC joints 10 mm compatible with AC separation injury. Left elbow X ray 09/29 showed no evidence of acute fracture or dislocation and no joint effusion. Showed Bony mineralization is midly decreased. Mild narrowing of radiocapitellar and ulnotrochlear joint spaces.   Today patient describes left shoulder pain as an intermittent grabbing pain that comes and goes after 10 seconds. Patient already taking scheduled tylenol TID for chronic back pain. He also reports bilateral hip.   Patient was last seen by Dr. Rosendo Gros 09/14 and plan is for G tube to remain in place to prevent recurrence of hiatal hernia   Nurse reports that patient continues to guard left arm but continues to deny pain.    Staff to assist patient with ambulation. Patient feeds self and ted hose on bilaterally      Past Medical History:  Diagnosis Date   Acute kidney injury ([place]) 03/06/14   Anemia, unspecified 03/06/14   Arthritis    BPH (benign prostatic hyperplasia)    Cerebral atrophy ([place]) 03/06/14   Cerebrovascular disease 03/06/14   Degenerative disc disease, lumbar 03/06/14   Diverticula, bladder acquired    Edema 03/06/14   GERD (gastroesophageal reflux  disease)    H/O: GI bleed 1986   Hiatal hernia    History of fall 03/06/14   Hypercalcemia 03/06/14   Hyperlipidemia    Hypertension    Impotence    LFT elevation 03/06/14   Lumbago 03/06/14   Mallory-Weiss tear 1986   Osteoporosis    Rhabdomyolysis 03/06/14   Scoliosis of cervical spine 03/06/14   Scoliosis of lumbar spine 03/06/14   Shingles    Spermatocele    bilateral, left  greater than right   Troponin level elevated 03/06/14   Weak 03/06/14   Past Surgical History:  Procedure Laterality Date   BACK SURGERY  10/2009   ESI for surgery for lumbar spinal stenosis   BIOPSY  11/24/2020   Procedure: BIOPSY;  Surgeon: Milus Banister, MD;  Location: [facility];  Service: Endoscopy;;   clubbed toe repair  2004   COLONOSCOPY  08/15/2011   ESOPHAGOGASTRODUODENOSCOPY (EGD) WITH PROPOFOL N/A 11/24/2020   Procedure: ESOPHAGOGASTRODUODENOSCOPY (EGD) WITH PROPOFOL;  Surgeon: Milus Banister, MD;  Location: [facility] END[facility];  Service: Endoscopy;  Laterality: N/A;   HIATAL HERNIA REPAIR N/A 11/28/2020   Procedure: Gastrostomy tube placement;  Surgeon: Ralene Ok, MD;  Location: [facility] ORS;  Service: General;  Laterality: N/A;  90 MINUTES   INGUINAL HERNIA REPAIR Right 11/26/2004   Dr Rebekah [place] - open w mesh   REPLACEMENT TOTAL KNEE BILATERAL  2007, 2008   TONSILLECTOMY AND ADENOIDECTOMY  1939   X-STOP IMPLANTATION      Allergies  Allergen Reactions   Amrix [Cyclobenzaprine]    Shellfish Allergy     Outpatient Encounter Medications as of 05/16/2021  Medication Sig   acetaminophen (TYLENOL) 500 MG tablet Take 1,000 mg by mouth every 8 (eight) hours.   antiseptic oral rinse (BIOTENE) LIQD 15 mLs by Mouth Rinse route 4 (four) times daily as needed for dry mouth.   bisacodyl (DULCOLAX) 10 MG suppository Place 10 mg rectally as needed for moderate constipation.   cholestyramine (QUESTRAN) 4 g packet Take 1 packet (4 g total) by mouth 2 (two) times daily.   iron polysaccharides (NIFEREX) 150 MG capsule Take 150 mg by mouth as directed. On Monday, Tuesday, Thursday, and Saturday   lactose free nutrition (BOOST) LIQD Take 237 mLs by mouth daily as needed.   levothyroxine (SYNTHROID) 50 MCG tablet Take 50 mcg by mouth daily before breakfast.   loperamide (IMODIUM) 2 MG capsule Take 2 mg by mouth every 8 (eight) hours as needed for diarrhea or loose stools.   Melatonin 5 MG CAPS Take  1 capsule (5 mg total) by mouth at bedtime.   ondansetron (ZOFRAN) 4 MG tablet Take 4 mg by mouth every 6 (six) hours as needed for nausea or vomiting.   pantoprazole (PROTONIX) 40 MG tablet Take 40 mg by mouth daily.   Polyethyl Glycol-Propyl Glycol (SYSTANE) 0.4-0.3 % SOLN Two drops both eyes three times daily as needed for dry itchy eyes.   saccharomyces boulardii (FLORASTOR) 250 MG capsule Take 250 mg by mouth 2 (two) times daily.   tamsulosin (FLOMAX) 0.4 MG CAPS capsule Take 1 capsule (0.4 mg total) by mouth daily.   triamcinolone cream (KENALOG) 0.1 % Apply 1 application topically as needed.   No facility-administered encounter medications on file as of 05/16/2021.    Review of Systems  Constitutional:  Negative for appetite change, fatigue and unexpected weight change.  HENT:  Positive for hearing loss.   Eyes:  Negative for visual disturbance.  Respiratory:  Negative for  cough and shortness of breath.   Cardiovascular:  Positive for leg swelling. Negative for chest pain.  Gastrointestinal:  Positive for abdominal pain. Negative for blood in stool, constipation, diarrhea and nausea.  Genitourinary:  Negative for dysuria.  Musculoskeletal:  Positive for gait problem.  Neurological:  Negative for dizziness, tremors, syncope, speech difficulty, light-headedness, numbness and headaches.  Hematological:  Bruises/bleeds easily.  Psychiatric/Behavioral:  Negative for agitation.    Immunization History  Administered Date(s) Administered   Influenza, High Dose Seasonal PF 06/08/2020   Moderna Sars-Covid-2 Vaccination 09/09/2019, 09/27/2019   Td 09/02/2007   Tdap 11/11/2017, 04/04/2021   Pertinent  Health Maintenance Due  Topic Date Due   DEXA SCAN  04/30/2022 (Originally 12/30/2014)   INFLUENZA VACCINE  Discontinued   Fall Risk  02/29/2020 10/19/2019 06/13/2019 03/16/2019 05/12/2018  Falls in the past year? 0 0 0 0 No  Number falls in past yr: 0 0 0 0 -  Injury with Fall? 0 0 0 0 -   Risk for fall due to : - - Impaired balance/gait - -  Follow up - - Falls evaluation completed - -   Functional Status Survey:    Vitals:   05/16/21 1008  BP: (!) 167/76  Pulse: (!) 58  Resp: 20  Temp: 98.9 F (37.2 C)  SpO2: 97%   There is no height or weight on file to calculate BMI. Physical Exam Constitutional:      General: He is not in acute distress. HENT:     Head: Normocephalic and atraumatic.     Nose: Nose normal.     Mouth/Throat:     Mouth: Mucous membranes are moist.  Eyes:     Extraocular Movements: Extraocular movements intact.     Pupils: Pupils are equal, round, and reactive to light.  Cardiovascular:     Rate and Rhythm: Normal rate and regular rhythm.     Pulses: Normal pulses.     Heart sounds: Normal heart sounds.  Pulmonary:     Effort: Pulmonary effort is normal.     Breath sounds: Normal breath sounds.  Abdominal:     General: Abdomen is flat. Bowel sounds are normal. There is no distension.     Tenderness: There is no guarding.  Musculoskeletal:        General: Tenderness present.     Right shoulder: Normal.     Left shoulder: Swelling (clavicle), deformity, tenderness and bony tenderness present. Decreased range of motion. Normal strength. Normal pulse.     Cervical back: Normal range of motion and neck supple.     Right hip: Tenderness present. No deformity.     Left hip: Tenderness present. No deformity.     Right lower leg: Edema present.     Left lower leg: Edema present.     Comments: Tenderness in left arm and bilateral hip tenderness. Limited ROM in left UE and Bilateral LE  +CMS to LUE  Pain with ROM to BLE L>R  Skin:    General: Skin is dry.     Findings: Bruising present.  Neurological:     Mental Status: He is alert. Mental status is at baseline.     Cranial Nerves: No cranial nerve deficit.     Sensory: No sensory deficit.     Motor: Weakness present.  Psychiatric:        Mood and Affect: Mood normal.         Behavior: Behavior normal.    Labs reviewed: Recent Labs  11/26/20 0332 11/27/20 0327 11/28/20 0340 11/29/20 0338 12/06/20 0000 12/26/20 0000 02/15/21 0000 04/08/21 0000  NA 134* 134* 132* 136   < > 136* 136* 134*  K 3.9 4.0 3.9 4.3   < > 4.3 4.1 3.8  CL 109 106 102 105   < > 100 104 101  CO2 19* 21* 20* 22   < > 22 21 24*  GLUCOSE 103* 97 98 132*  --   --   --   --   BUN 24* 21 17 20    < > 21 18 17   CREATININE 1.38* 1.29* 1.49* 1.49*   < > 1.4* 1.3 1.4*  CALCIUM 7.7* 7.9* 7.7* 8.1*   < > 8.8 8.0* 7.8*  MG 1.7 2.0 1.8 2.0  --   --   --   --   PHOS 2.6 3.3 3.0  --   --   --   --   --    < > = values in this interval not displayed.   Recent Labs    11/27/20 0327 11/28/20 0340 11/29/20 0338 12/06/20 0000 12/10/20 0000 02/15/21 0000 04/08/21 0000  AST 16 15 16    < > 17 13* 16  ALT 14 12 12    < > 15 5* 7*  ALKPHOS 60 64 68   < > 97 77 75  BILITOT 0.9 0.8 0.7  --   --   --   --   PROT 5.6* 5.7* 6.4*  --   --   --   --   ALBUMIN 2.5* 2.6* 2.7*  --  3.0* 3.2* 3.0*   < > = values in this interval not displayed.   Recent Labs    11/26/20 0332 11/27/20 0327 11/28/20 0340 11/29/20 0338 12/06/20 0000 04/08/21 0000 05/01/21 0000 05/09/21 0000  WBC 9.2 8.4 9.2 12.1*   < > 5.5 6.6 5.9  NEUTROABS 5.7 5.1 5.8  --   --   --   --   --   HGB 8.1* 7.6*  8.4* 8.4* 9.4*   < > 8.3* 7.4* 8.3*  HCT 24.8* 23.8*  26.1* 26.9* 29.7*   < > 27* 23* 26*  MCV 90.8 91.3 93.4 92.2  --   --   --   --   PLT 240 265 284 262   < > 221 395 322   < > = values in this interval not displayed.   Lab Results  Component Value Date   TSH 5.07 04/08/2021   Lab Results  Component Value Date   HGBA1C 5.6 11/26/2020   No results found for: CHOL, HDL, LDLCALC, LDLDIRECT, TRIG, CHOLHDL  Significant Diagnostic Results in last 30 days:  No results found.  Assessment/Plan  1. Closed displaced fracture of acromial end of left clavicle, initial encounter Ortho referral  Apply sling to left arm  until ortho appointment  Ice 15 min 3 times a day for 48 hrs Continue tylenol. Start tramadol 25 mg q 8 for mild pain  or Tramadol 50 mg q8 for moderated pain   2. Hip pain, bilateral 2 view of hip and pelvis due to pain and fall  3. Fall, subsequent encounter Safety precautions discussed with patient  OT to eval for a WC to prevent falls.   4. . Primary hypertension History of hypertension. Not on HTN meds. Possibly related to increase in pain.       Family/ staff Communication: With patient and nurse  Labs/tests ordered: bilateral hip xrays

## 2021-05-17 ENCOUNTER — Other Ambulatory Visit: Payer: Self-pay | Admitting: Orthopedic Surgery

## 2021-05-17 DIAGNOSIS — R293 Abnormal posture: Secondary | ICD-10-CM | POA: Diagnosis not present

## 2021-05-17 DIAGNOSIS — R278 Other lack of coordination: Secondary | ICD-10-CM | POA: Diagnosis not present

## 2021-05-17 DIAGNOSIS — Z8616 Personal history of COVID-19: Secondary | ICD-10-CM | POA: Diagnosis not present

## 2021-05-17 DIAGNOSIS — R102 Pelvic and perineal pain: Secondary | ICD-10-CM | POA: Diagnosis not present

## 2021-05-17 DIAGNOSIS — F0150 Vascular dementia without behavioral disturbance: Secondary | ICD-10-CM | POA: Diagnosis not present

## 2021-05-17 DIAGNOSIS — M6389 Disorders of muscle in diseases classified elsewhere, multiple sites: Secondary | ICD-10-CM | POA: Diagnosis not present

## 2021-05-17 DIAGNOSIS — M25552 Pain in left hip: Secondary | ICD-10-CM

## 2021-05-17 DIAGNOSIS — M25551 Pain in right hip: Secondary | ICD-10-CM | POA: Diagnosis not present

## 2021-05-20 ENCOUNTER — Encounter: Payer: Self-pay | Admitting: Internal Medicine

## 2021-05-20 ENCOUNTER — Other Ambulatory Visit: Payer: Self-pay | Admitting: Orthopedic Surgery

## 2021-05-20 ENCOUNTER — Non-Acute Institutional Stay (SKILLED_NURSING_FACILITY): Payer: Medicare Other | Admitting: Internal Medicine

## 2021-05-20 DIAGNOSIS — N1832 Chronic kidney disease, stage 3b: Secondary | ICD-10-CM | POA: Diagnosis not present

## 2021-05-20 DIAGNOSIS — M25551 Pain in right hip: Secondary | ICD-10-CM | POA: Diagnosis not present

## 2021-05-20 DIAGNOSIS — Z9889 Other specified postprocedural states: Secondary | ICD-10-CM | POA: Diagnosis not present

## 2021-05-20 DIAGNOSIS — M25552 Pain in left hip: Secondary | ICD-10-CM

## 2021-05-20 DIAGNOSIS — Z8616 Personal history of COVID-19: Secondary | ICD-10-CM | POA: Diagnosis not present

## 2021-05-20 DIAGNOSIS — I10 Essential (primary) hypertension: Secondary | ICD-10-CM

## 2021-05-20 DIAGNOSIS — S42032S Displaced fracture of lateral end of left clavicle, sequela: Secondary | ICD-10-CM

## 2021-05-20 DIAGNOSIS — M6389 Disorders of muscle in diseases classified elsewhere, multiple sites: Secondary | ICD-10-CM | POA: Diagnosis not present

## 2021-05-20 DIAGNOSIS — R293 Abnormal posture: Secondary | ICD-10-CM | POA: Diagnosis not present

## 2021-05-20 DIAGNOSIS — F0150 Vascular dementia without behavioral disturbance: Secondary | ICD-10-CM | POA: Diagnosis not present

## 2021-05-20 DIAGNOSIS — D500 Iron deficiency anemia secondary to blood loss (chronic): Secondary | ICD-10-CM | POA: Diagnosis not present

## 2021-05-20 DIAGNOSIS — K219 Gastro-esophageal reflux disease without esophagitis: Secondary | ICD-10-CM

## 2021-05-20 DIAGNOSIS — R278 Other lack of coordination: Secondary | ICD-10-CM | POA: Diagnosis not present

## 2021-05-20 NOTE — Progress Notes (Signed)
Location:   [facility] Room Number: 132 Place of Service:  SNF [phone]) Provider:  Veleta Miners MD  [name], MD  Patient Care Team: [name], MD as PCP - General (Internal Medicine)  Extended Emergency Contact Information Primary Emergency Contact: Rudean Hitt Address: [place], [place] [address] [name] [place] Phone: [phone] Mobile Phone: [phone] Relation: Daughter Secondary Emergency Contact: Simms,Carol Address: [address]          [place], [place] [address] [place] Phone: [phone] Work Phone: [phone] Relation: Other  Code Status:  DNR Goals of care: Advanced Directive information Advanced Directives 05/20/2021  Does Patient Have a Medical Advance Directive? Yes  Type of Paramedic of [employer];Living will;Out of facility DNR (pink MOST or yellow form)  Does patient want to make changes to medical advance directive? No - Patient declined  Copy of [place] in Chart? Yes - validated most recent copy scanned in chart (See row information)  Would patient like information on creating a medical advance directive? -  Pre-existing out of facility DNR order (yellow form or pink MOST form) Yellow form placed in chart (order not valid for inpatient use);Pink MOST form placed in chart (order not valid for inpatient use)     Chief Complaint  Patient presents with   Medical Management of Chronic Issues   Quality Metric Gaps    Shingrix, #3 Covid    HPI:  Pt is a 87 y.o. male seen today for medical management of chronic diseases.    Patient has h/o Anemia due to GI bleed on Iron S/p Gastropexy and Gastric reduction for Large Hiatal Hernia and now has G Tube since 4/22 H/o Urinary Retention Bilateral Hydronephrosis  now has Foley Catheter  Patient fell on 9/28 from his Wheelchair. Seen by Alyse Low for  Left shoulder pain Was found to have Left Clavicle fracture Ortho gave him the Sling to wear for now He also c/o Left hip pain specially when they move him through [place]. Was seen by ortho last week and has CT scan of Hip pending.  He is suppose to be NWB till then  He did not have any acute complain today Pain in his shoulder seems bearable No pain on rest in his hip either Has lost some weight but  doing better now per Dietician with his intake   Past Medical History:  Diagnosis Date   Acute kidney injury ([place]) 03/06/14   Anemia, unspecified 03/06/14   Arthritis    BPH (benign prostatic hyperplasia)    Cerebral atrophy ([place]) 03/06/14   Cerebrovascular disease 03/06/14   Degenerative disc disease, lumbar 03/06/14   Diverticula, bladder acquired    Edema 03/06/14   GERD (gastroesophageal reflux disease)    H/O: GI bleed 1986   Hiatal hernia    History of fall 03/06/14   Hypercalcemia 03/06/14   Hyperlipidemia    Hypertension    Impotence    LFT elevation 03/06/14   Lumbago 03/06/14   Mallory-Weiss tear 1986   Osteoporosis    Rhabdomyolysis 03/06/14   Scoliosis of cervical spine 03/06/14   Scoliosis of lumbar spine 03/06/14   Shingles    Spermatocele    bilateral, left greater than right   Troponin level elevated 03/06/14   Weak 03/06/14   Past Surgical History:  Procedure Laterality Date   BACK  SURGERY  10/2009   ESI for surgery for lumbar spinal stenosis   BIOPSY  11/24/2020   Procedure: BIOPSY;  Surgeon: Milus Banister, MD;  Location: [facility];  Service: Endoscopy;;   clubbed toe repair  2004   COLONOSCOPY  08/15/2011   ESOPHAGOGASTRODUODENOSCOPY (EGD) WITH PROPOFOL N/A 11/24/2020   Procedure: ESOPHAGOGASTRODUODENOSCOPY (EGD) WITH PROPOFOL;  Surgeon: Milus Banister, MD;  [facility]: [facility];  Service: Endoscopy;  Laterality: N/A;   HIATAL HERNIA REPAIR N/A 11/28/2020   Procedure: Gastrostomy tube placement;  Surgeon: Ralene Ok, MD;  Location: [facility];   Service: General;  Laterality: N/A;  90 MINUTES   INGUINAL HERNIA REPAIR Right 11/26/2004   Dr Rebekah Chesterfield - open w mesh   REPLACEMENT TOTAL KNEE BILATERAL  2007, 2008   TONSILLECTOMY AND ADENOIDECTOMY  1939   X-STOP IMPLANTATION      Allergies  Allergen Reactions   Amrix [Cyclobenzaprine]    Shellfish Allergy     Allergies as of 05/20/2021       Reactions   Amrix [cyclobenzaprine]    Shellfish Allergy         Medication List        Accurate as of May 20, 2021 10:44 AM. If you have any questions, ask your nurse or doctor.          acetaminophen 500 MG tablet Commonly known as: TYLENOL Take 1,000 mg by mouth every 8 (eight) hours.   antiseptic oral rinse Liqd 15 mLs by Mouth Rinse route 4 (four) times daily as needed for dry mouth.   bisacodyl 10 MG suppository Commonly known as: DULCOLAX Place 10 mg rectally as needed for moderate constipation.   cholestyramine 4 g packet Commonly known as: Questran Take 1 packet (4 g total) by mouth 2 (two) times daily.   iron polysaccharides 150 MG capsule Commonly known as: NIFEREX Take 150 mg by mouth as directed. On Monday, Tuesday, Thursday, and Saturday   lactose free nutrition Liqd Take 237 mLs by mouth daily as needed.   levothyroxine 50 MCG tablet Commonly known as: SYNTHROID Take 50 mcg by mouth daily before breakfast.   loperamide 2 MG capsule Commonly known as: IMODIUM Take 2 mg by mouth every 8 (eight) hours as needed for diarrhea or loose stools.   Melatonin 5 MG Caps Take 1 capsule (5 mg total) by mouth at bedtime.   ondansetron 4 MG tablet Commonly known as: ZOFRAN Take 4 mg by mouth every 6 (six) hours as needed for nausea or vomiting.   pantoprazole 40 MG tablet Commonly known as: PROTONIX Take 40 mg by mouth daily.   saccharomyces boulardii 250 MG capsule Commonly known as: FLORASTOR Take 250 mg by mouth 2 (two) times daily.   Systane 0.4-0.3 % Soln Generic drug: Polyethyl Glycol-Propyl  Glycol Two drops both eyes three times daily as needed for dry itchy eyes.   tamsulosin 0.4 MG Caps capsule Commonly known as: FLOMAX Take 1 capsule (0.4 mg total) by mouth daily.   traMADol 50 MG tablet Commonly known as: ULTRAM Take 0.5-1 tablets (25-50 mg total) by mouth every 8 (eight) hours as needed for up to 5 days. 25 mg for mild pain, 50 mg for severe pain   triamcinolone cream 0.1 % Commonly known as: KENALOG Apply 1 application topically as needed.        Review of Systems  Constitutional:  Positive for activity change.  HENT: Negative.    Respiratory: Negative.    Cardiovascular: Negative.  Gastrointestinal: Negative.   Genitourinary: Negative.   Musculoskeletal:  Positive for arthralgias, back pain, gait problem and myalgias.  Skin:  Positive for color change.  Neurological:  Positive for weakness.  Psychiatric/Behavioral:  Positive for confusion.    Immunization History  Administered Date(s) Administered   Influenza, High Dose Seasonal PF 06/08/2020   Moderna Sars-Covid-2 Vaccination 09/09/2019, 09/27/2019   Td 09/02/2007   Tdap 11/11/2017, 04/04/2021   Pertinent  Health Maintenance Due  Topic Date Due   DEXA SCAN  04/30/2022 (Originally 12/30/2014)   INFLUENZA VACCINE  Discontinued   Fall Risk  02/29/2020 10/19/2019 06/13/2019 03/16/2019 05/12/2018  Falls in the past year? 0 0 0 0 No  Number falls in past yr: 0 0 0 0 -  Injury with Fall? 0 0 0 0 -  Risk for fall due to : - - Impaired balance/gait - -  Follow up - - Falls evaluation completed - -   Functional Status Survey:    Vitals:   05/20/21 1038  BP: 121/77  Pulse: (!) 55  Resp: 19  Temp: 97.9 F (36.6 C)  SpO2: 93%  Weight: 128 lb 6.4 oz (58.2 kg)  Height: 5\' 4"  (1.626 m)   Body mass index is 22.04 kg/m. Physical Exam Vitals reviewed.  Constitutional:      Appearance: Normal appearance.  HENT:     Head: Normocephalic.     Nose: Nose normal.     Mouth/Throat:     Mouth: Mucous  membranes are moist.     Pharynx: Oropharynx is clear.  Eyes:     Pupils: Pupils are equal, round, and reactive to light.  Cardiovascular:     Rate and Rhythm: Normal rate and regular rhythm.     Pulses: Normal pulses.  Pulmonary:     Effort: Pulmonary effort is normal.     Breath sounds: Normal breath sounds.  Abdominal:     General: Abdomen is flat. Bowel sounds are normal.     Palpations: Abdomen is soft.  Musculoskeletal:        General: No swelling.     Cervical back: Neck supple.     Comments: Left Shoulder Clavicle deformity No pain and Moving his Arm Was able to Move his left leg with no discomfort  Skin:    General: Skin is warm.  Neurological:     General: No focal deficit present.     Mental Status: He is alert.  Psychiatric:        Mood and Affect: Mood normal.        Thought Content: Thought content normal.    Labs reviewed: Recent Labs    11/26/20 0332 11/27/20 0327 11/28/20 0340 11/29/20 0338 12/06/20 0000 12/26/20 0000 02/15/21 0000 04/08/21 0000  NA 134* 134* 132* 136   < > 136* 136* 134*  K 3.9 4.0 3.9 4.3   < > 4.3 4.1 3.8  CL 109 106 102 105   < > 100 104 101  CO2 19* 21* 20* 22   < > 22 21 24*  GLUCOSE 103* 97 98 132*  --   --   --   --   BUN 24* 21 17 20    < > 21 18 17   CREATININE 1.38* 1.29* 1.49* 1.49*   < > 1.4* 1.3 1.4*  CALCIUM 7.7* 7.9* 7.7* 8.1*   < > 8.8 8.0* 7.8*  MG 1.7 2.0 1.8 2.0  --   --   --   --   PHOS 2.6  3.3 3.0  --   --   --   --   --    < > = values in this interval not displayed.   Recent Labs    11/27/20 0327 11/28/20 0340 11/29/20 0338 12/06/20 0000 12/10/20 0000 02/15/21 0000 04/08/21 0000  AST 16 15 16    < > 17 13* 16  ALT 14 12 12    < > 15 5* 7*  ALKPHOS 60 64 68   < > 97 77 75  BILITOT 0.9 0.8 0.7  --   --   --   --   PROT 5.6* 5.7* 6.4*  --   --   --   --   ALBUMIN 2.5* 2.6* 2.7*  --  3.0* 3.2* 3.0*   < > = values in this interval not displayed.   Recent Labs    11/26/20 0332 11/27/20 0327  11/28/20 0340 11/29/20 0338 12/06/20 0000 04/08/21 0000 05/01/21 0000 05/09/21 0000  WBC 9.2 8.4 9.2 12.1*   < > 5.5 6.6 5.9  NEUTROABS 5.7 5.1 5.8  --   --   --   --   --   HGB 8.1* 7.6*  8.4* 8.4* 9.4*   < > 8.3* 7.4* 8.3*  HCT 24.8* 23.8*  26.1* 26.9* 29.7*   < > 27* 23* 26*  MCV 90.8 91.3 93.4 92.2  --   --   --   --   PLT 240 265 284 262   < > 221 395 322   < > = values in this interval not displayed.   Lab Results  Component Value Date   TSH 5.07 04/08/2021   Lab Results  Component Value Date   HGBA1C 5.6 11/26/2020   No results found for: CHOL, HDL, LDLCALC, LDLDIRECT, TRIG, CHOLHDL  Significant Diagnostic Results in last 30 days:  No results found.  Assessment/Plan Closed displaced fracture of acromial end of left clavicle, sequela Seen by Ortho Continue Sling for now Pain seems Controlled Hip pain, bilateral Xrays were negative NWB per Ortho till he gets his CT scan done Primary hypertension On no meds Loose control due to his falls Iron deficiency anemia due to chronic blood loss and Chronic Renal disease Hgb continues to be low  On Iron No aggressive work up.   Stage 3b chronic kidney disease (HCC) Creat stable Gastroesophageal reflux disease,  On Protonix S/P gastric surgery Per Surgery G tube needs to stay for now Urinary Retention /p Foley catheter H/o Diarrhea Has been on Questran and stable   Family/ staff Communication:   Labs/tests ordered:

## 2021-05-21 ENCOUNTER — Emergency Department (HOSPITAL_BASED_OUTPATIENT_CLINIC_OR_DEPARTMENT_OTHER): Payer: Medicare Other | Admitting: Radiology

## 2021-05-21 ENCOUNTER — Other Ambulatory Visit: Payer: Self-pay

## 2021-05-21 ENCOUNTER — Inpatient Hospital Stay (HOSPITAL_BASED_OUTPATIENT_CLINIC_OR_DEPARTMENT_OTHER)
Admission: EM | Admit: 2021-05-21 | Discharge: 2021-05-28 | DRG: 480 | Disposition: A | Discharge status: Skilled Nursing Facility | Payer: Medicare Other | Source: Skilled Nursing Facility | Attending: Internal Medicine | Admitting: Internal Medicine

## 2021-05-21 ENCOUNTER — Other Ambulatory Visit (HOSPITAL_COMMUNITY): Payer: Self-pay | Admitting: Orthopedic Surgery

## 2021-05-21 ENCOUNTER — Emergency Department (HOSPITAL_BASED_OUTPATIENT_CLINIC_OR_DEPARTMENT_OTHER): Payer: Medicare Other

## 2021-05-21 ENCOUNTER — Encounter (HOSPITAL_BASED_OUTPATIENT_CLINIC_OR_DEPARTMENT_OTHER): Payer: Self-pay | Admitting: *Deleted

## 2021-05-21 DIAGNOSIS — U071 COVID-19: Secondary | ICD-10-CM | POA: Diagnosis not present

## 2021-05-21 DIAGNOSIS — D509 Iron deficiency anemia, unspecified: Secondary | ICD-10-CM | POA: Diagnosis not present

## 2021-05-21 DIAGNOSIS — M25552 Pain in left hip: Secondary | ICD-10-CM

## 2021-05-21 DIAGNOSIS — F0390 Unspecified dementia without behavioral disturbance: Secondary | ICD-10-CM | POA: Diagnosis present

## 2021-05-21 DIAGNOSIS — M6389 Disorders of muscle in diseases classified elsewhere, multiple sites: Secondary | ICD-10-CM | POA: Diagnosis not present

## 2021-05-21 DIAGNOSIS — E039 Hypothyroidism, unspecified: Secondary | ICD-10-CM | POA: Diagnosis present

## 2021-05-21 DIAGNOSIS — K449 Diaphragmatic hernia without obstruction or gangrene: Secondary | ICD-10-CM | POA: Diagnosis present

## 2021-05-21 DIAGNOSIS — Y92129 Unspecified place in nursing home as the place of occurrence of the external cause: Secondary | ICD-10-CM

## 2021-05-21 DIAGNOSIS — Z66 Do not resuscitate: Secondary | ICD-10-CM | POA: Diagnosis present

## 2021-05-21 DIAGNOSIS — E440 Moderate protein-calorie malnutrition: Secondary | ICD-10-CM | POA: Diagnosis present

## 2021-05-21 DIAGNOSIS — I4891 Unspecified atrial fibrillation: Secondary | ICD-10-CM | POA: Diagnosis not present

## 2021-05-21 DIAGNOSIS — S72012A Unspecified intracapsular fracture of left femur, initial encounter for closed fracture: Secondary | ICD-10-CM | POA: Diagnosis not present

## 2021-05-21 DIAGNOSIS — Z931 Gastrostomy status: Secondary | ICD-10-CM

## 2021-05-21 DIAGNOSIS — S72002A Fracture of unspecified part of neck of left femur, initial encounter for closed fracture: Secondary | ICD-10-CM | POA: Diagnosis not present

## 2021-05-21 DIAGNOSIS — K219 Gastro-esophageal reflux disease without esophagitis: Secondary | ICD-10-CM | POA: Diagnosis present

## 2021-05-21 DIAGNOSIS — N400 Enlarged prostate without lower urinary tract symptoms: Secondary | ICD-10-CM | POA: Diagnosis present

## 2021-05-21 DIAGNOSIS — S82122A Displaced fracture of lateral condyle of left tibia, initial encounter for closed fracture: Secondary | ICD-10-CM

## 2021-05-21 DIAGNOSIS — R4189 Other symptoms and signs involving cognitive functions and awareness: Secondary | ICD-10-CM | POA: Diagnosis present

## 2021-05-21 DIAGNOSIS — Z888 Allergy status to other drugs, medicaments and biological substances status: Secondary | ICD-10-CM

## 2021-05-21 DIAGNOSIS — R278 Other lack of coordination: Secondary | ICD-10-CM | POA: Diagnosis not present

## 2021-05-21 DIAGNOSIS — W19XXXA Unspecified fall, initial encounter: Secondary | ICD-10-CM | POA: Diagnosis present

## 2021-05-21 DIAGNOSIS — R293 Abnormal posture: Secondary | ICD-10-CM | POA: Diagnosis not present

## 2021-05-21 DIAGNOSIS — Z91013 Allergy to seafood: Secondary | ICD-10-CM

## 2021-05-21 DIAGNOSIS — Z87891 Personal history of nicotine dependence: Secondary | ICD-10-CM

## 2021-05-21 DIAGNOSIS — N183 Chronic kidney disease, stage 3 unspecified: Secondary | ICD-10-CM | POA: Diagnosis present

## 2021-05-21 DIAGNOSIS — Z681 Body mass index (BMI) 19 or less, adult: Secondary | ICD-10-CM

## 2021-05-21 DIAGNOSIS — F0150 Vascular dementia without behavioral disturbance: Secondary | ICD-10-CM | POA: Diagnosis not present

## 2021-05-21 DIAGNOSIS — E785 Hyperlipidemia, unspecified: Secondary | ICD-10-CM | POA: Diagnosis present

## 2021-05-21 DIAGNOSIS — Z79890 Hormone replacement therapy (postmenopausal): Secondary | ICD-10-CM

## 2021-05-21 DIAGNOSIS — Z01818 Encounter for other preprocedural examination: Secondary | ICD-10-CM | POA: Diagnosis not present

## 2021-05-21 DIAGNOSIS — I129 Hypertensive chronic kidney disease with stage 1 through stage 4 chronic kidney disease, or unspecified chronic kidney disease: Secondary | ICD-10-CM | POA: Diagnosis present

## 2021-05-21 DIAGNOSIS — Z8616 Personal history of COVID-19: Secondary | ICD-10-CM | POA: Diagnosis not present

## 2021-05-21 LAB — BASIC METABOLIC PANEL
Anion gap: 10 (ref 5–15)
BUN: 18 mg/dL (ref 8–23)
CO2: 25 mmol/L (ref 22–32)
Calcium: 8.5 mg/dL — ABNORMAL LOW (ref 8.9–10.3)
Chloride: 99 mmol/L (ref 98–111)
Creatinine, Ser: 1.12 mg/dL (ref 0.61–1.24)
GFR, Estimated: >60 mL/min (ref >60)
Glucose, Bld: 101 mg/dL — ABNORMAL HIGH (ref 70–99)
Potassium: 3.6 mmol/L (ref 3.5–5.1)
Sodium: 134 mmol/L — ABNORMAL LOW (ref 135–145)

## 2021-05-21 LAB — RESP PANEL BY RT-PCR (FLU A&B, COVID) ARPGX2
Influenza A by PCR: NEGATIVE
Influenza B by PCR: NEGATIVE
SARS Coronavirus 2 by RT PCR: NEGATIVE

## 2021-05-21 LAB — CBC WITH DIFFERENTIAL/PLATELET
Abs Immature Granulocytes: 0.02 10*3/uL (ref 0.00–0.07)
Basophils Absolute: 0 10*3/uL (ref 0.0–0.1)
Basophils Relative: 0 %
Eosinophils Absolute: 0.4 10*3/uL (ref 0.0–0.5)
Eosinophils Relative: 4 %
HCT: 31 % — ABNORMAL LOW (ref 39.0–52.0)
Hemoglobin: 9.5 g/dL — ABNORMAL LOW (ref 13.0–17.0)
Immature Granulocytes: 0 %
Lymphocytes Relative: 19 %
Lymphs Abs: 2.1 10*3/uL (ref 0.7–4.0)
MCH: 24.6 pg — ABNORMAL LOW (ref 26.0–34.0)
MCHC: 30.6 g/dL (ref 30.0–36.0)
MCV: 80.3 fL (ref 80.0–100.0)
Monocytes Absolute: 1.2 10*3/uL — ABNORMAL HIGH (ref 0.1–1.0)
Monocytes Relative: 11 %
Neutro Abs: 7.4 10*3/uL (ref 1.7–7.7)
Neutrophils Relative %: 66 %
Platelets: 533 10*3/uL — ABNORMAL HIGH (ref 150–400)
RBC: 3.86 MIL/uL — ABNORMAL LOW (ref 4.22–5.81)
RDW: 19.3 % — ABNORMAL HIGH (ref 11.5–15.5)
WBC: 11.2 10*3/uL — ABNORMAL HIGH (ref 4.0–10.5)
nRBC: 0 % (ref 0.0–0.2)

## 2021-05-21 NOTE — ED Triage Notes (Signed)
Pt dropped off by [facility] and left. Pt states he fell 3 weeks. Left hip pain. Pt received Seen on 9/29 due to the fall from chart notes.

## 2021-05-21 NOTE — ED Provider Notes (Signed)
[facility] EMERGENCY DEPT Provider Note   CSN: [id number] Arrival date & time: 05/21/21  1715     History Chief Complaint  Patient presents with   [name] is a 87 y.o. male.  87 yo M with a chief complaints of left hip pain.  He fell about 3 weeks ago and since then has had pain with the left hip whenever he tries to move it.  He was dropped off today by security is not sure exactly why he was brought in.  Denies any pain while still.  Has been bedbound since.  Walked before.    The history is provided by the patient.  Fall This is a new problem. The current episode started more than 1 week ago. The problem occurs constantly. The problem has not changed since onset.Pertinent negatives include no chest pain, no abdominal pain, no headaches and no shortness of breath. Nothing aggravates the symptoms. Nothing relieves the symptoms. He has tried nothing for the symptoms. The treatment provided no relief.      Past Medical History:  Diagnosis Date   Acute kidney injury ([place]) 03/06/14   Anemia, unspecified 03/06/14   Arthritis    BPH (benign prostatic hyperplasia)    Cerebral atrophy ([place]) 03/06/14   Cerebrovascular disease 03/06/14   Degenerative disc disease, lumbar 03/06/14   Diverticula, bladder acquired    Edema 03/06/14   GERD (gastroesophageal reflux disease)    H/O: GI bleed 1986   Hiatal hernia    History of fall 03/06/14   Hypercalcemia 03/06/14   Hyperlipidemia    Hypertension    Impotence    LFT elevation 03/06/14   Lumbago 03/06/14   Mallory-Weiss tear 1986   Osteoporosis    Rhabdomyolysis 03/06/14   Scoliosis of cervical spine 03/06/14   Scoliosis of lumbar spine 03/06/14   Shingles    Spermatocele    bilateral, left greater than right   Troponin level elevated 03/06/14   Weak 03/06/14    Patient Active Problem List   Diagnosis Date Noted   Closed left hip fracture ([place]) 05/21/2021   Irritable bowel syndrome with diarrhea 03/27/2021    HOH (hard of hearing) 11/25/2020   Scoliosis of thoracolumbar region due to degenerative disease of spine in adult 11/25/2020   Right groin mass c/w sebaceous cyst 11/25/2020   Cameron ulcer, acute with bleeding & anemia 11/25/2020   IDA (iron deficiency anemia) 11/25/2020   Uses roller walker 11/25/2020   HTN (hypertension) 11/24/2020   Hypothyroidism 11/24/2020   Cognitive impairment 11/24/2020   CKD (chronic kidney disease), stage III ([place]) 02/03/2019   H/O sinus bradycardia 11/20/2017   Senile osteoporosis 11/20/2017   Other idiopathic scoliosis, thoracolumbar region 11/20/2017   Continuous leakage of urine 11/20/2017   Protein-calorie malnutrition, moderate ([place]) 02/28/2014   Chronic low back pain 02/26/2014   Elevated LFTs 02/26/2014   Alcohol use 02/26/2014   Generalized weakness 02/26/2014   GERD (gastroesophageal reflux disease) 08/13/2011   Lumbar disc disease 08/13/2011    Past Surgical History:  Procedure Laterality Date   BACK SURGERY  10/2009   ESI for surgery for lumbar spinal stenosis   BIOPSY  11/24/2020   Procedure: BIOPSY;  Surgeon: Milus Banister, MD;  Location: [facility];  Service: Endoscopy;;   clubbed toe repair  2004   COLONOSCOPY  08/15/2011   ESOPHAGOGASTRODUODENOSCOPY (EGD) WITH PROPOFOL N/A 11/24/2020   Procedure: ESOPHAGOGASTRODUODENOSCOPY (EGD) WITH PROPOFOL;  Surgeon: Milus Banister, MD;  Location: [facility];  Service: Endoscopy;  Laterality: N/A;   HIATAL HERNIA REPAIR N/A 11/28/2020   Procedure: Gastrostomy tube placement;  Surgeon: Ralene Ok, MD;  Location: [facility];  Service: General;  Laterality: N/A;  24 MINUTES   INGUINAL HERNIA REPAIR Right 11/26/2004   Dr Rebekah [place] - open w mesh   REPLACEMENT TOTAL KNEE BILATERAL  2007, 2008   TONSILLECTOMY AND ADENOIDECTOMY  1939   X-STOP IMPLANTATION         Family History  Problem Relation Age of Onset   Alcoholism Father    Liver disease Father    Colon cancer Neg Hx     Social  History   Tobacco Use   Smoking status: Former    Types: Cigarettes   Smokeless tobacco: Never   Tobacco comments:    quit in 1960s  Vaping Use   Vaping Use: Never used  Substance Use Topics   Alcohol use: Yes    Alcohol/week: 14.0 standard drinks    Types: 14 Standard drinks or equivalent per week    Comment: couple of drinks of scotch every other day   Drug use: No    Home Medications Prior to Admission medications   Medication Sig Start Date End Date Taking? Authorizing Provider  acetaminophen (TYLENOL) 500 MG tablet Take 1,000 mg by mouth every 8 (eight) hours.    [provider]  antiseptic oral rinse (BIOTENE) LIQD 15 mLs by Mouth Rinse route 4 (four) times daily as needed for dry mouth.    [provider]  bisacodyl (DULCOLAX) 10 MG suppository Place 10 mg rectally as needed for moderate constipation.    [provider]  cholestyramine (QUESTRAN) 4 g packet Take 1 packet (4 g total) by mouth 2 (two) times daily. 12/27/20   Royal Hawthorn, NP  iron polysaccharides (NIFEREX) 150 MG capsule Take 150 mg by mouth as directed. On Monday, Tuesday, Thursday, and Saturday    [provider]  lactose free nutrition (BOOST) LIQD Take 237 mLs by mouth daily as needed.    [provider]  levothyroxine (SYNTHROID) 50 MCG tablet Take 50 mcg by mouth daily before breakfast.    [provider]  loperamide (IMODIUM) 2 MG capsule Take 2 mg by mouth every 8 (eight) hours as needed for diarrhea or loose stools.    [provider]  Melatonin 5 MG CAPS Take 1 capsule (5 mg total) by mouth at bedtime. 07/04/20   Reed, Tiffany L, DO  ondansetron (ZOFRAN) 4 MG tablet Take 4 mg by mouth every 6 (six) hours as needed for nausea or vomiting.    [provider]  pantoprazole (PROTONIX) 40 MG tablet Take 40 mg by mouth daily.    [provider]  Polyethyl Glycol-Propyl Glycol (SYSTANE) 0.4-0.3 % SOLN Two drops both eyes three  times daily as needed for dry itchy eyes.    [provider]  saccharomyces boulardii (FLORASTOR) 250 MG capsule Take 250 mg by mouth 2 (two) times daily.    [provider]  tamsulosin (FLOMAX) 0.4 MG CAPS capsule Take 1 capsule (0.4 mg total) by mouth daily. 11/30/20   [place] Donnamarie Poag, DO  traMADol (ULTRAM) 50 MG tablet Take 0.5-1 tablets (25-50 mg total) by mouth every 8 (eight) hours as needed for up to 5 days. 25 mg for mild pain, 50 mg for severe pain 05/16/21 05/21/21  Royal Hawthorn, NP  triamcinolone cream (KENALOG) 0.1 % Apply 1 application topically as needed. 02/25/19   [provider]    Allergies    Amrix [cyclobenzaprine] and Shellfish allergy  Review of Systems   Review of Systems  Constitutional:  Negative for chills and fever.  HENT:  Negative for congestion and facial swelling.   Eyes:  Negative for discharge and visual disturbance.  Respiratory:  Negative for shortness of breath.   Cardiovascular:  Negative for chest pain and palpitations.  Gastrointestinal:  Negative for abdominal pain, diarrhea and vomiting.  Musculoskeletal:  Positive for arthralgias. Negative for myalgias.  Skin:  Negative for color change and rash.  Neurological:  Negative for tremors, syncope and headaches.  Psychiatric/Behavioral:  Negative for confusion and dysphoric mood.    Physical Exam Updated Vital Signs BP (!) 161/69   Pulse 74   Temp 98.4 F (36.9 C)   Resp (!) 23   SpO2 100%   Physical Exam Vitals and nursing note reviewed.  Constitutional:      Appearance: He is well-developed.  HENT:     Head: Normocephalic and atraumatic.  Eyes:     Pupils: Pupils are equal, round, and reactive to light.  Neck:     Vascular: No JVD.  Cardiovascular:     Rate and Rhythm: Normal rate and regular rhythm.     Heart sounds: No murmur heard.   No friction rub. No gallop.  Pulmonary:     Effort: No respiratory distress.     Breath sounds: No wheezing.  Abdominal:      General: There is no distension.     Tenderness: There is no abdominal tenderness. There is no guarding or rebound.  Musculoskeletal:        General: Normal range of motion.     Cervical back: Normal range of motion and neck supple.     Comments: PMS intact distally.   Skin:    Coloration: Skin is not pale.     Findings: No rash.  Neurological:     Mental Status: He is alert and oriented to person, place, and time.  Psychiatric:        Behavior: Behavior normal.    ED Results / Procedures / Treatments   Labs (all labs ordered are listed, but only abnormal results are displayed) Labs Reviewed  CBC WITH DIFFERENTIAL/PLATELET - Abnormal; Notable for the following components:      Result Value   WBC 11.2 (*)    RBC 3.86 (*)    Hemoglobin 9.5 (*)    HCT 31.0 (*)    MCH 24.6 (*)    RDW 19.3 (*)    Platelets 533 (*)    Monocytes Absolute 1.2 (*)    All other components within normal limits  BASIC METABOLIC PANEL - Abnormal; Notable for the following components:   Sodium 134 (*)    Glucose, Bld 101 (*)    Calcium 8.5 (*)    All other components within normal limits  RESP PANEL BY RT-PCR (FLU A&B, COVID) [id number]    EKG EKG Interpretation  Date/Time:  Tuesday May 21 2021 17:32:57 EDT Ventricular Rate:  83 PR Interval:    QRS Duration: 94 QT Interval:  374 QTC Calculation: 439 R Axis:   54 Text Interpretation: Atrial fibrillation with a competing junctional pacemaker Abnormal ECG No significant change since last tracing Confirmed by Deno Etienne [phone]) on 05/21/2021 5:36:36 PM  Radiology DG Chest Port 1 View  Result Date: 05/21/2021 CLINICAL DATA:  Preop.  Left hip fracture. EXAM: PORTABLE CHEST 1 VIEW COMPARISON:  11/24/2020 FINDINGS: Normal heart size.  No pericardial effusion. Scratch set no pleural effusion or edema. No airspace opacities identified. Calcified granuloma is again noted within the lingula. The visualized osseous structures are unremarkable.  IMPRESSION: No acute cardiopulmonary abnormalities. Electronically Signed   By: Kerby Moors M.D.   On: 05/21/2021 19:33   DG Hip Unilat W or Wo Pelvis 2-3 Views Left  Result Date: 05/21/2021 CLINICAL DATA:  Hip pain, fall 3 weeks ago EXAM: DG HIP (WITH OR WITHOUT PELVIS) 2-3V LEFT COMPARISON:  CT 11/24/2020 FINDINGS: Findings are suspicious for subacute nondisplaced left subcapital femoral neck fracture. No femoral head dislocation. Mild degenerative changes of both hips. Pubic symphysis and rami are intact. IMPRESSION: Findings suspicious for subacute left subcapital femoral neck fracture Electronically Signed   By: Donavan Foil M.D.   On: 05/21/2021 18:48    Procedures Procedures   Medications Ordered in ED Medications - No data to display  ED Course  I have reviewed the triage vital signs and the nursing notes.  Pertinent labs & imaging results that were available during my care of the patient were reviewed by me and considered in my medical decision making (see chart for details).    MDM Rules/Calculators/A&P                           87 yo M with a cc of left hip pain.  Suffered a fall three weeks ago.  With severe pain has been bed bound, plan for CT.  Sent here today, plain film viewed by me with femoral neck fx.  Discuss with ortho.  I did discuss the case with Fenton Foy, PA-C on call for [facility].  Recommended admitting to [facility].  The patients results and plan were reviewed and discussed.   Any x-rays performed were independently reviewed by myself.   Differential diagnosis were considered with the presenting HPI.  Medications - No data to display  Vitals:   05/21/21 1726 05/21/21 2038 05/21/21 2048  BP: (!) 170/113 (!) 136/98 (!) 161/69  Pulse: 84  74  Resp: (!) 28  (!) 23  Temp: 98.4 F (36.9 C)    SpO2: 100%  100%    Final diagnoses:  Left displaced femoral neck fracture ([place])    Admission/ observation were discussed with the admitting  physician, patient and/or family and they are comfortable with the plan.   Final Clinical Impression(s) / ED Diagnoses Final diagnoses:  Left displaced femoral neck fracture [facility])    Rx / DC Orders ED Discharge Orders     None        Deno Etienne, DO 05/21/21 2305

## 2021-05-22 ENCOUNTER — Other Ambulatory Visit: Payer: Medicare Other

## 2021-05-22 ENCOUNTER — Inpatient Hospital Stay (HOSPITAL_COMMUNITY): Payer: Medicare Other

## 2021-05-22 ENCOUNTER — Encounter (HOSPITAL_COMMUNITY): Payer: Self-pay | Admitting: Internal Medicine

## 2021-05-22 DIAGNOSIS — Z888 Allergy status to other drugs, medicaments and biological substances status: Secondary | ICD-10-CM | POA: Diagnosis not present

## 2021-05-22 DIAGNOSIS — S72002D Fracture of unspecified part of neck of left femur, subsequent encounter for closed fracture with routine healing: Secondary | ICD-10-CM | POA: Diagnosis not present

## 2021-05-22 DIAGNOSIS — M1612 Unilateral primary osteoarthritis, left hip: Secondary | ICD-10-CM | POA: Diagnosis not present

## 2021-05-22 DIAGNOSIS — S72012A Unspecified intracapsular fracture of left femur, initial encounter for closed fracture: Secondary | ICD-10-CM | POA: Diagnosis present

## 2021-05-22 DIAGNOSIS — K219 Gastro-esophageal reflux disease without esophagitis: Secondary | ICD-10-CM | POA: Diagnosis present

## 2021-05-22 DIAGNOSIS — N400 Enlarged prostate without lower urinary tract symptoms: Secondary | ICD-10-CM | POA: Diagnosis present

## 2021-05-22 DIAGNOSIS — U071 COVID-19: Secondary | ICD-10-CM | POA: Diagnosis not present

## 2021-05-22 DIAGNOSIS — S72002A Fracture of unspecified part of neck of left femur, initial encounter for closed fracture: Secondary | ICD-10-CM | POA: Diagnosis not present

## 2021-05-22 DIAGNOSIS — E785 Hyperlipidemia, unspecified: Secondary | ICD-10-CM | POA: Diagnosis present

## 2021-05-22 DIAGNOSIS — Y92129 Unspecified place in nursing home as the place of occurrence of the external cause: Secondary | ICD-10-CM | POA: Diagnosis not present

## 2021-05-22 DIAGNOSIS — F0390 Unspecified dementia without behavioral disturbance: Secondary | ICD-10-CM | POA: Diagnosis present

## 2021-05-22 DIAGNOSIS — M25452 Effusion, left hip: Secondary | ICD-10-CM | POA: Diagnosis not present

## 2021-05-22 DIAGNOSIS — M47817 Spondylosis without myelopathy or radiculopathy, lumbosacral region: Secondary | ICD-10-CM | POA: Diagnosis not present

## 2021-05-22 DIAGNOSIS — Z91013 Allergy to seafood: Secondary | ICD-10-CM | POA: Diagnosis not present

## 2021-05-22 DIAGNOSIS — Z931 Gastrostomy status: Secondary | ICD-10-CM | POA: Diagnosis not present

## 2021-05-22 DIAGNOSIS — N183 Chronic kidney disease, stage 3 unspecified: Secondary | ICD-10-CM | POA: Diagnosis present

## 2021-05-22 DIAGNOSIS — S72142A Displaced intertrochanteric fracture of left femur, initial encounter for closed fracture: Secondary | ICD-10-CM | POA: Diagnosis not present

## 2021-05-22 DIAGNOSIS — Z743 Need for continuous supervision: Secondary | ICD-10-CM | POA: Diagnosis not present

## 2021-05-22 DIAGNOSIS — W19XXXA Unspecified fall, initial encounter: Secondary | ICD-10-CM | POA: Diagnosis not present

## 2021-05-22 DIAGNOSIS — S3993XA Unspecified injury of pelvis, initial encounter: Secondary | ICD-10-CM | POA: Diagnosis not present

## 2021-05-22 DIAGNOSIS — E039 Hypothyroidism, unspecified: Secondary | ICD-10-CM | POA: Diagnosis present

## 2021-05-22 DIAGNOSIS — R531 Weakness: Secondary | ICD-10-CM | POA: Diagnosis not present

## 2021-05-22 DIAGNOSIS — Z79890 Hormone replacement therapy (postmenopausal): Secondary | ICD-10-CM | POA: Diagnosis not present

## 2021-05-22 DIAGNOSIS — Z66 Do not resuscitate: Secondary | ICD-10-CM | POA: Diagnosis present

## 2021-05-22 DIAGNOSIS — R5381 Other malaise: Secondary | ICD-10-CM | POA: Diagnosis not present

## 2021-05-22 DIAGNOSIS — Z87891 Personal history of nicotine dependence: Secondary | ICD-10-CM | POA: Diagnosis not present

## 2021-05-22 DIAGNOSIS — D509 Iron deficiency anemia, unspecified: Secondary | ICD-10-CM | POA: Diagnosis present

## 2021-05-22 DIAGNOSIS — E440 Moderate protein-calorie malnutrition: Secondary | ICD-10-CM | POA: Diagnosis present

## 2021-05-22 DIAGNOSIS — K449 Diaphragmatic hernia without obstruction or gangrene: Secondary | ICD-10-CM | POA: Diagnosis present

## 2021-05-22 DIAGNOSIS — I129 Hypertensive chronic kidney disease with stage 1 through stage 4 chronic kidney disease, or unspecified chronic kidney disease: Secondary | ICD-10-CM | POA: Diagnosis present

## 2021-05-22 DIAGNOSIS — Z681 Body mass index (BMI) 19 or less, adult: Secondary | ICD-10-CM | POA: Diagnosis not present

## 2021-05-22 LAB — TYPE AND SCREEN
ABO/RH(D): O POS
Antibody Screen: NEGATIVE

## 2021-05-22 LAB — HEPATIC FUNCTION PANEL
ALT: 9 U/L (ref 0–44)
AST: 15 U/L (ref 15–41)
Albumin: 2.6 g/dL — ABNORMAL LOW (ref 3.5–5.0)
Alkaline Phosphatase: 70 U/L (ref 38–126)
Bilirubin, Direct: 0.1 mg/dL (ref 0.0–0.2)
Indirect Bilirubin: 0.4 mg/dL (ref 0.3–0.9)
Total Bilirubin: 0.5 mg/dL (ref 0.3–1.2)
Total Protein: 6.3 g/dL — ABNORMAL LOW (ref 6.5–8.1)

## 2021-05-22 LAB — CBC
HCT: 25.5 % — ABNORMAL LOW (ref 39.0–52.0)
Hemoglobin: 8.2 g/dL — ABNORMAL LOW (ref 13.0–17.0)
MCH: 26 pg (ref 26.0–34.0)
MCHC: 32.2 g/dL (ref 30.0–36.0)
MCV: 81 fL (ref 80.0–100.0)
Platelets: 429 10*3/uL — ABNORMAL HIGH (ref 150–400)
RBC: 3.15 MIL/uL — ABNORMAL LOW (ref 4.22–5.81)
RDW: 19.1 % — ABNORMAL HIGH (ref 11.5–15.5)
WBC: 8.9 10*3/uL (ref 4.0–10.5)
nRBC: 0 % (ref 0.0–0.2)

## 2021-05-22 LAB — BASIC METABOLIC PANEL
Anion gap: 9 (ref 5–15)
BUN: 16 mg/dL (ref 8–23)
CO2: 25 mmol/L (ref 22–32)
Calcium: 7.9 mg/dL — ABNORMAL LOW (ref 8.9–10.3)
Chloride: 99 mmol/L (ref 98–111)
Creatinine, Ser: 1.17 mg/dL (ref 0.61–1.24)
GFR, Estimated: 58 mL/min — ABNORMAL LOW (ref >60)
Glucose, Bld: 94 mg/dL (ref 70–99)
Potassium: 3.6 mmol/L (ref 3.5–5.1)
Sodium: 133 mmol/L — ABNORMAL LOW (ref 135–145)

## 2021-05-22 LAB — SURGICAL PCR SCREEN
MRSA, PCR: NEGATIVE
Staphylococcus aureus: NEGATIVE

## 2021-05-22 MED ORDER — POVIDONE-IODINE 7.5 % EX SOLN
Freq: Once | CUTANEOUS | Status: DC
  Filled 2021-05-22: qty 118

## 2021-05-22 MED ORDER — LACTATED RINGERS IV SOLN: INTRAVENOUS | Status: DC

## 2021-05-22 MED ORDER — BISACODYL 10 MG RE SUPP: 10.0000 mg | RECTAL | Status: DC | PRN

## 2021-05-22 MED ORDER — POVIDONE-IODINE 10 % EX SWAB: 2.0000 "application " | Freq: Once | CUTANEOUS | Status: DC

## 2021-05-22 MED ORDER — SENNOSIDES-DOCUSATE SODIUM 8.6-50 MG PO TABS
1.0000 | ORAL_TABLET | Freq: Two times a day (BID) | ORAL | Status: DC
  Administered 2021-05-22: 1 via ORAL
  Filled 2021-05-22: qty 1

## 2021-05-22 MED ORDER — CHLORHEXIDINE GLUCONATE CLOTH 2 % EX PADS
6.0000 | MEDICATED_PAD | Freq: Every day | CUTANEOUS | Status: DC
  Administered 2021-05-22 – 2021-05-23 (×2): 6 via TOPICAL

## 2021-05-22 MED ORDER — CEFAZOLIN SODIUM-DEXTROSE 2-4 GM/100ML-% IV SOLN
2.0000 g | INTRAVENOUS | Status: AC
  Administered 2021-05-23: 2 g via INTRAVENOUS
  Filled 2021-05-22: qty 100

## 2021-05-22 MED ORDER — CHOLESTYRAMINE LIGHT 4 G PO PACK
4.0000 g | PACK | Freq: Two times a day (BID) | ORAL | Status: DC
  Administered 2021-05-23 – 2021-05-28 (×10): 4 g via ORAL
  Filled 2021-05-22 (×13): qty 1

## 2021-05-22 MED ORDER — ENSURE ENLIVE PO LIQD
237.0000 mL | Freq: Three times a day (TID) | ORAL | Status: DC
  Administered 2021-05-22 – 2021-05-28 (×16): 237 mL via ORAL

## 2021-05-22 MED ORDER — TAMSULOSIN HCL 0.4 MG PO CAPS
0.4000 mg | ORAL_CAPSULE | Freq: Every day | ORAL | Status: DC
  Administered 2021-05-23 – 2021-05-28 (×6): 0.4 mg via ORAL
  Filled 2021-05-22 (×6): qty 1

## 2021-05-22 MED ORDER — PANTOPRAZOLE SODIUM 40 MG PO TBEC
40.0000 mg | DELAYED_RELEASE_TABLET | Freq: Every day | ORAL | Status: DC
  Administered 2021-05-23 – 2021-05-28 (×6): 40 mg via ORAL
  Filled 2021-05-22 (×6): qty 1

## 2021-05-22 MED ORDER — MORPHINE SULFATE (PF) 2 MG/ML IV SOLN: 0.5000 mg | INTRAVENOUS | Status: DC | PRN

## 2021-05-22 MED ORDER — MELATONIN 5 MG PO TABS
5.0000 mg | ORAL_TABLET | Freq: Every day | ORAL | Status: DC
  Administered 2021-05-22 – 2021-05-27 (×6): 5 mg via ORAL
  Filled 2021-05-22 (×6): qty 1

## 2021-05-22 MED ORDER — SACCHAROMYCES BOULARDII 250 MG PO CAPS
250.0000 mg | ORAL_CAPSULE | Freq: Two times a day (BID) | ORAL | Status: DC
  Administered 2021-05-22 – 2021-05-28 (×11): 250 mg via ORAL
  Filled 2021-05-22 (×11): qty 1

## 2021-05-22 MED ORDER — ADULT MULTIVITAMIN W/MINERALS CH
1.0000 | ORAL_TABLET | Freq: Every day | ORAL | Status: DC
  Administered 2021-05-24 – 2021-05-28 (×5): 1 via ORAL
  Filled 2021-05-22 (×5): qty 1

## 2021-05-22 MED ORDER — POLYSACCHARIDE IRON COMPLEX 150 MG PO CAPS
150.0000 mg | ORAL_CAPSULE | Freq: Every day | ORAL | Status: DC
  Administered 2021-05-24 – 2021-05-28 (×5): 150 mg via ORAL
  Filled 2021-05-22 (×5): qty 1

## 2021-05-22 MED ORDER — HYDROCODONE-ACETAMINOPHEN 5-325 MG PO TABS
1.0000 | ORAL_TABLET | Freq: Four times a day (QID) | ORAL | Status: DC | PRN
  Administered 2021-05-23 – 2021-05-25 (×4): 1 via ORAL
  Filled 2021-05-22 (×4): qty 1

## 2021-05-22 MED ORDER — LEVOTHYROXINE SODIUM 50 MCG PO TABS
50.0000 ug | ORAL_TABLET | Freq: Every day | ORAL | Status: DC
  Administered 2021-05-24 – 2021-05-28 (×5): 50 ug via ORAL
  Filled 2021-05-22 (×6): qty 1

## 2021-05-22 NOTE — Progress Notes (Signed)
Initial Nutrition Assessment  DOCUMENTATION CODES:   Underweight  INTERVENTION:   -Once diet is advanced, add:  -Ensure Enlive po TID, each supplement provides 350 kcal and 20 grams of protein  -MVI with minerals daily  NUTRITION DIAGNOSIS:   Increased nutrient needs related to post-op healing as evidenced by estimated needs.  GOAL:   Patient will meet greater than or equal to 90% of their needs  MONITOR:   PO intake, Supplement acceptance, Diet advancement, Labs, Weight trends, Skin, I & O's  REASON FOR ASSESSMENT:   Consult Assessment of nutrition requirement/status, Hip fracture protocol  ASSESSMENT:   [name], 87 y.o. male, presented to the ED last night, three weeks removed from a fall that led to a nondisplaced femoral neck fracture. He was previously seen in clinic on 05/16/2021 for evaluation, x-ray revealed possible nondisplaced subcapital femoral neck fracture, and a CT scan was ordered by Dr. Gladstone Lighter to determine the extent of the fracture.  Pt admitted with closed lt hip fracture.   Reviewed I/O's: -200 ml x 24 hours  UOP: 200 ml x 24 hours  Pt unavailable at time of visit. Attempted to speak with pt via call to hospital room phone, however, unable to reach. RD unable to obtain further nutrition-related history or complete nutrition-focused physical exam at this time.    Per chart review, pt with PEG tube. However, per review of Care Everywhere, pt eats orally per RD at facility. He also consumes Boost supplements PTA.   Per orthopedics notes, plan further work-up today to determine timing of surgery. Pt currently NPO pending surgery.   Reviewed wt hx; pt wt has been stable over the past 3 months.   Pt is at high risk for malnutrition due to advanced age and underweight status, however, unable to identify at this time. Pt also with increased nutritional needs for post-op healing and would greatly benefit from addition of oral nutrition supplements.    Medications reviewed and include melatonin.   Labs reviewed: Na: 133.    Diet Order:   Diet Order             Diet NPO time specified  Diet effective now                   EDUCATION NEEDS:   No education needs have been identified at this time  Skin:  Skin Assessment: Skin Integrity Issues: Skin Integrity Issues:: Other (Comment) Other: skin tear to lt elbow  Last BM:  05/22/21  Height:   Ht Readings from Last 1 Encounters:  05/22/21 6' (1.829 m)    Weight:   Wt Readings from Last 1 Encounters:  05/22/21 60.5 kg    Ideal Body Weight:  80.9 kg  BMI:  Body mass index is 18.09 kg/m.  Estimated Nutritional Needs:   Kcal:  1900-2100  Protein:  105-120 grams  Fluid:  > 1.9 L    Loistine Chance, RD, LDN, [place] Registered Dietitian II Certified Diabetes Care and Education Specialist Please refer to [facility] for RD and/or RD on-call/weekend/after hours pager

## 2021-05-22 NOTE — H&P (View-Only) (Signed)
CT scan confirmed left subcapital femoral neck fracture.  Plan for OR tomorrow for cannulated hip pinning with Dr. Lyla Glassing.  NPO after midnight.  Preop orders placed.    Fenton Foy, [place], PA-C Orthopedic Surgery [phone]

## 2021-05-22 NOTE — Progress Notes (Signed)
Spoke with daughter, [name], regarding plan for surgery tomorrow. She was unaware he was in the hospital. She is his financial POA. His healthcare POA is his attorney, Arbie Cookey. Debbie informed me she will let Arbie Cookey know and someone will come to the hospital to sign his consent tomorrow morning.

## 2021-05-22 NOTE — Progress Notes (Signed)
TRH H&P addendum:  The patient has already been seen for PEG tube concerns by Dr. Rosendo Gros last month.  His note, seen on the care everywhere tab, stated that it is preferable that he keeps this PEG in place due to his hiatal hernia. The tube was briefly seen by surgery today, just needed to be repositioned and seems to be working just fine.  They also provided the staff with some instructions.  Official general surgery consult has been deferred.  Tennis Must, MD.

## 2021-05-22 NOTE — Consult Note (Signed)
CONSULT NOTE   Subjective:  Chief Complaint: Left Hip Pain  HPI: [name], 87 y.o. male, presented to the ED last night, three weeks removed from a fall that led to a nondisplaced femoral neck fracture. He was previously seen in clinic on 05/16/2021 for evaluation, x-ray revealed possible nondisplaced subcapital femoral neck fracture, and a CT scan was ordered by Dr. Gladstone Lighter to determine the extent of the fracture.   Patient then reported to ED last night in pain, and presents now for evaluation of his left hip. No known recurrent injury or fall.   ROS: Constitutional: no fever, no chills, no night sweats, no significant weight loss Cardiovascular: no chest pain, no palpitations Respiratory: no cough, no shortness of breath, No COPD Gastrointestinal: no vomiting, no nausea Musculoskeletal: no swelling in Joints, Joint Pain Neurologic: no numbness, no tingling, no difficulty with balance   Objective:  Physical Exam:   General: alert and no distress HENT:Head: Normal, normocephalic, atraumatic. Neck:supple Chest:clear to auscultation, no wheezes, rales or rhonchi, symmetric air entry Heart:S1, S2 normal, no murmur, rub or gallop, regular rate and rhythm Abdomen:abdomen soft and non-tender Musculoskeletal: Left lower extremity: Neurologically intact Neurovascular intact Sensation intact distally Intact pulses distally Dorsiflexion/Plantar flexion intact Compartment soft  Tenderness to palpation about the lateral left hip. Mild pain with PROM.   Vital signs in last 24 hours: Temp:  [97.6 F (36.4 C)-98.7 F (37.1 C)] 98.7 F (37.1 C) (10/05 0549) Pulse Rate:  [61-84] 61 (10/05 0549) Resp:  [16-28] 16 (10/05 0549) BP: (136-170)/(69-113) 147/77 (10/05 0549) SpO2:  [97 %-100 %] 98 % (10/05 0549) Weight:  [60.5 kg] 60.5 kg (10/05 0032)  Imaging Review   Assessment/Plan: Nondisplaced subcapital femoral neck fracture  Plan:  Ordering CT scan to determine extent of  fracture. Patient not in a lot of pain this AM when seen. Will wait for results of CT scan to determine possible surgical intervention.    Fenton Foy, MBA, PA-C Orthopedic Surgery [facility] [place]         Physical Exam:   Vitals: Pulse: 61 Respirations:16 Blood Pressure: 147/77   General: alert and no distress HENT:Head: Normal, normocephalic, atraumatic. Neck:supple Chest:clear to auscultation, no wheezes, rales or rhonchi, symmetric air entry Heart:S1, S2 normal, no murmur, rub or gallop, regular rate and rhythm Abdomen:abdomen soft and non-tender Musculoskeletal: Left lower extremity: Neurologically intact Neurovascular intact Sensation intact distally Intact pulses distally Dorsiflexion/Plantar flexion intact Compartment soft  Tenderness to palpation about the lateral left hip.  Assessment/Plan:

## 2021-05-22 NOTE — H&P (Signed)
History and Physical    [name] YBO:[id number] DOB: [birth year] DOA: 05/21/2021  PCP: [name], MD   Patient coming from: SNF ([facility]).  I have personally briefly reviewed patient's old medical records in [place]  Chief Complaint: Fall.  HPI: [name] is a 87 y.o. male with medical history significant of BPH, spermatocele, bladder diverticuli, rhabdomyolysis, history of AKI, normocytic anemia, osteoarthritis, cerebral atrophy, history of cerebrovascular disease, scoliosis of C-spine and lumbar spine, lumbar DJD, GERD, history of Mallory-Weiss tear, hypercalcemia, osteoporosis, hyperlipidemia, hypertension, history of herpes zoster who was taken to the emergency [facility] due to having a fall about 3 weeks ago.  The patient does not remember all the details of how things have been, but stated it was an accident.  He has not been walking.  He has Foley catheter that according to him was placed long time ago before the fall.  He denied fever, chills, sore throat, rhinorrhea, cough, wheezing or hemoptysis.  No chest pain, palpitations, diaphoresis, dizziness, PND, orthopnea or pitting edema of the lower extremities.  He gets frequently constipated, but he denied abdominal pain, nausea, vomiting, diarrhea, melena or hematochezia.  He does not remember when he got the feeding tube and what was the reason for it.  The nursing staff stated that the tube may not be secure well enough.  No flank pain, dysuria or hematuria.  No polyuria, polydipsia, polyphagia or blurred vision.  ED Course: 98.4 F, pulse 84, respirations 28, BP 170/113 mmHg O2 sat 100% on room air.  Dr. Tyrone Nine discussed the case with orthopedic surgery on-call who requested hospitalist service admission for medical management.  Lab work: CBC showed a white count of 11.2, hemoglobin 9.5 g/dL platelets 533.  Sodium is 134 minimal/L.  Glucose 101 and calcium 8.5 mg/dL.  Imaging: A 1 view chest  radiograph did not show any acute cardiopulmonary pathology.  Left hip and pelvics x-rays showed findings suspicious for superacute left subcapital femoral neck fracture.  Please see images and full radiology report for further details.  Review of Systems: As per HPI otherwise all other systems reviewed and are negative.  Past Medical History:  Diagnosis Date   Acute kidney injury ([place]) 03/06/14   Anemia, unspecified 03/06/14   Arthritis    BPH (benign prostatic hyperplasia)    Cerebral atrophy ([place]) 03/06/14   Cerebrovascular disease 03/06/14   Degenerative disc disease, lumbar 03/06/14   Diverticula, bladder acquired    Edema 03/06/14   GERD (gastroesophageal reflux disease)    H/O: GI bleed 1986   Hiatal hernia    History of fall 03/06/14   Hypercalcemia 03/06/14   Hyperlipidemia    Hypertension    Impotence    LFT elevation 03/06/14   Lumbago 03/06/14   Mallory-Weiss tear 1986   Osteoporosis    Rhabdomyolysis 03/06/14   Scoliosis of cervical spine 03/06/14   Scoliosis of lumbar spine 03/06/14   Shingles    Spermatocele    bilateral, left greater than right   Troponin level elevated 03/06/14   Weak 03/06/14    Past Surgical History:  Procedure Laterality Date   BACK SURGERY  10/2009   ESI for surgery for lumbar spinal stenosis   BIOPSY  11/24/2020   Procedure: BIOPSY;  Surgeon: Milus Banister, MD;  Location: [facility];  Service: Endoscopy;;   clubbed toe repair  2004   COLONOSCOPY  08/15/2011   ESOPHAGOGASTRODUODENOSCOPY (EGD) WITH PROPOFOL N/A 11/24/2020   Procedure: ESOPHAGOGASTRODUODENOSCOPY (EGD)  WITH PROPOFOL;  Surgeon: Milus Banister, MD;  Location: [facility];  Service: Endoscopy;  Laterality: N/A;   HIATAL HERNIA REPAIR N/A 11/28/2020   Procedure: Gastrostomy tube placement;  Surgeon: Ralene Ok, MD;  Location: [facility];  Service: General;  Laterality: N/A;  [address] Right 11/26/2004   Dr Rebekah [place] - open w mesh   REPLACEMENT TOTAL KNEE  BILATERAL  2007, 2008   TONSILLECTOMY AND ADENOIDECTOMY  1939   X-STOP IMPLANTATION     Social History  reports that he has quit smoking. His smoking use included cigarettes. He has never used smokeless tobacco. He reports current alcohol use of about 14.0 standard drinks per week. He reports that he does not use drugs.  Allergies  Allergen Reactions   Amrix [Cyclobenzaprine]    Shellfish Allergy     Not documented on the [facility]   Family History  Problem Relation Age of Onset   Alcoholism Father    Liver disease Father    Colon cancer Neg Hx    Prior to Admission medications   Medication Sig Start Date End Date Taking? Authorizing Provider  acetaminophen (TYLENOL) 500 MG tablet Take 1,000 mg by mouth every 8 (eight) hours.   Yes [provider]  antiseptic oral rinse (BIOTENE) LIQD 15 mLs by Mouth Rinse route 4 (four) times daily as needed for dry mouth.   Yes [provider]  bisacodyl (DULCOLAX) 10 MG suppository Place 10 mg rectally as needed for moderate constipation.   Yes [provider]  cholestyramine (QUESTRAN) 4 g packet Take 1 packet (4 g total) by mouth 2 (two) times daily. 12/27/20  Yes Wert, Margreta Journey, NP  iron polysaccharides (NIFEREX) 150 MG capsule Take 150 mg by mouth See admin instructions. Give 150mg  capsule by mouth once a morning with meal and 2 hours separate from [place]   Yes [provider]  lactose free nutrition (BOOST) LIQD Take 237 mLs by mouth daily as needed (as residents request).   Yes [provider]  levothyroxine (SYNTHROID) 50 MCG tablet Take 50 mcg by mouth daily before breakfast.   Yes [provider]  loperamide (IMODIUM) 2 MG capsule Take 2 mg by mouth every 8 (eight) hours as needed for diarrhea or loose stools.   Yes [provider]  Melatonin 5 MG CAPS Take 1 capsule (5 mg total) by mouth at bedtime. 07/04/20  Yes Reed, Tiffany L, DO  ondansetron (ZOFRAN) 4 MG tablet Take 4 mg by  mouth every 6 (six) hours as needed for nausea or vomiting.   Yes [provider]  pantoprazole (PROTONIX) 40 MG tablet Take 40 mg by mouth daily.   Yes [provider]  Polyethyl Glycol-Propyl Glycol (SYSTANE) 0.4-0.3 % SOLN Two drops both eyes three times daily as needed for dry itchy eyes.   Yes [provider]  saccharomyces boulardii (FLORASTOR) 250 MG capsule Take 250 mg by mouth 2 (two) times daily.   Yes [provider]  tamsulosin (FLOMAX) 0.4 MG CAPS capsule Take 1 capsule (0.4 mg total) by mouth daily. 11/30/20  Yes [place] Eric J, DO  traMADol (ULTRAM) 50 MG tablet Take 0.5-1 tablets (25-50 mg total) by mouth every 8 (eight) hours as needed for up to 5 days. 25 mg for mild pain, 50 mg for severe pain Patient taking differently: Take 25-50 mg by mouth every 6 (six) hours as needed (for mild to moderate pain). 05/16/21 05/22/22 Yes Royal Hawthorn, NP  triamcinolone  cream (KENALOG) 0.1 % Apply 1 application topically to back daily as needed for itching or rash   Yes [provider]    Physical Exam: Vitals:   05/22/21 0030 05/22/21 0032 05/22/21 0308 05/22/21 0549  BP:   (!) 160/70 (!) 147/77  Pulse:   68 61  Resp:   16 16  Temp:   97.6 F (36.4 C) 98.7 F (37.1 C)  TempSrc:    Oral  SpO2:   97% 98%  Weight:  60.5 kg    Height: 6' (1.829 m)       Constitutional: Frail, elderly male.  In NAD. Eyes: PERRL, lids and conjunctivae are pale. ENMT: Mucous membranes are mildly dry.  Posterior pharynx clear of any exudate or lesions. Neck: normal, supple, no masses, no thyromegaly Respiratory: clear to auscultation bilaterally, no wheezing, no crackles. Normal respiratory effort. No accessory muscle use.  Cardiovascular: Regular rate and rhythm, no murmurs / rubs / gallops. No extremity edema. 2+ pedal pulses. No carotid bruits.  Abdomen: PEG tube in place.  No tenderness, no masses palpated. No hepatosplenomegaly. Bowel sounds positive.   Musculoskeletal: Mild generalized weakness.  No clubbing / cyanosis.  Left hip joint area tenderness with severe decrease in ROM, no contractures. Normal muscle tone.  Skin: no acute rashes, lesions, ulcers on very limited dermatological Neurologic: CN 2-12 grossly intact. Sensation intact, DTR normal. Strength 5/5 in all 4.  Psychiatric: Alert and oriented x 2, disoriented to time, date and partially to situation.  Labs on Admission: I have personally reviewed following labs and imaging studies  CBC: Recent Labs  Lab 05/21/21 2007 05/22/21 0538  WBC 11.2* 8.9  NEUTROABS 7.4  --   HGB 9.5* 8.2*  HCT 31.0* 25.5*  MCV 80.3 81.0  PLT 533* 429*    Basic Metabolic Panel: Recent Labs  Lab 05/21/21 2022 05/22/21 0538  NA 134* 133*  K 3.6 3.6  CL 99 99  CO2 25 25  GLUCOSE 101* 94  BUN 18 16  CREATININE 1.12 1.17  CALCIUM 8.5* 7.9*   GFR: Estimated Creatinine Clearance: 33 mL/min (by C-G formula based on SCr of 1.17 mg/dL).  Liver Function Tests: No results for input(s): AST, ALT, ALKPHOS, BILITOT, PROT, ALBUMIN in the last 168 hours.  Radiological Exams on Admission: DG Chest Port 1 View  Result Date: 05/21/2021 CLINICAL DATA:  Preop.  Left hip fracture. EXAM: PORTABLE CHEST 1 VIEW COMPARISON:  11/24/2020 FINDINGS: Normal heart size. No pericardial effusion. Scratch set no pleural effusion or edema. No airspace opacities identified. Calcified granuloma is again noted within the lingula. The visualized osseous structures are unremarkable. IMPRESSION: No acute cardiopulmonary abnormalities. Electronically Signed   By: Kerby Moors M.D.   On: 05/21/2021 19:33   DG Hip Unilat W or Wo Pelvis 2-3 Views Left  Result Date: 05/21/2021 CLINICAL DATA:  Hip pain, fall 3 weeks ago EXAM: DG HIP (WITH OR WITHOUT PELVIS) 2-3V LEFT COMPARISON:  CT 11/24/2020 FINDINGS: Findings are suspicious for subacute nondisplaced left subcapital femoral neck fracture. No femoral head dislocation. Mild  degenerative changes of both hips. Pubic symphysis and rami are intact. IMPRESSION: Findings suspicious for subacute left subcapital femoral neck fracture Electronically Signed   By: Donavan Foil M.D.   On: 05/21/2021 18:48    EKG: Independently reviewed.  Vent. rate 77 BPM PR interval 150 ms QRS duration 109 ms QT/QTcB 416/471 ms P-R-T axes 92 4 71 Age not entered, assumed to be 87 years old  for purpose  of ECG interpretation Sinus rhythm Multiple premature complexes, vent & supraven Borderline right axis deviation  Assessment/Plan Principal Problem:   Closed left hip fracture (HCC) Admit to MedSurg/inpatient. Keep NPO. Buck's traction per protocol. Analgesics as needed. Antiemetics as needed. Orthopedic surgery consult appreciated. Consult nutritional services and TOC. Orthopedic surgery will consult PT postoperatively.  Active Problems:   S/P PEG tube placement [facility]) Patient not sure why he has a feeding tube. Tolerating well oral intake.  Not currently using. Kept in place to avoid hiatal hernia complication. Nursing staff concerned about PEG being loose. Surgery consulted to make sure placement secured.   GERD (gastroesophageal reflux disease) Continue pantoprazole 40 mg p.o. daily.    Protein-calorie malnutrition, moderate (HCC) Protein supplementation. Consult nutritional services.    CKD (chronic kidney disease), stage III (HCC) History of BPH. Has permanent Foley. Monitor electrolytes and GFR.    Hypothyroidism Continue levothyroxine 50 mcg p.o. daily.    Cognitive impairment Supportive care.    IDA (iron deficiency anemia) Monitor H&H. Transfuse as needed.    DVT prophylaxis: SCDs. Code Status:   DNR. Family Communication:  None at bedside. Disposition Plan:   Patient is from:  SNF.  Anticipated DC to:  SNF.  Anticipated DC date:  05/27/2021.  Anticipated DC barriers: Clinical status/consultant sign off. Consults called:  Orthopedic surgery  ([facility]). Admission status:  MedSurg/inpatient.   Severity of Illness: High severity after presenting with inability to ambulate and right hip pain in the setting of acute/subacute right hip fracture.  The patient will need to remain for evaluation by orthopedic surgery and surgical intervention.  Reubin Milan MD [facility]  How to contact the [facility] Attending or Consulting provider [place] or covering provider during after hours [place], for this patient?   Check the care team in [facility] and look for a) attending/consulting [facility] provider listed and b) the [facility] team listed Log into [email or web address] and use [place]'s universal password to access. If you do not have the password, please contact the hospital operator. Locate the [facility] provider you are looking for under [facility] and page to a number that you can be directly reached. If you still have difficulty reaching the provider, please page the [facility] (Director on Call) for the Hospitalists listed on amion for assistance.  05/22/2021, 8:05 AM   This document was prepared using [employer] and may contain some unintended transcription errors.

## 2021-05-22 NOTE — Plan of Care (Signed)
  Problem: Clinical Measurements: Goal: Postoperative complications will be avoided or minimized Outcome: Not Applicable

## 2021-05-22 NOTE — Progress Notes (Signed)
Patient has a peg tube that is not sutured. Area has been cleaned, split gauze change and tube has been secured with tape. Patient unable to recall wether peg is being used or not.

## 2021-05-22 NOTE — Progress Notes (Signed)
CT scan confirmed left subcapital femoral neck fracture.  Plan for OR tomorrow for cannulated hip pinning with Dr. Lyla Glassing.  NPO after midnight.  Preop orders placed.    Fenton Foy, [place], PA-C Orthopedic Surgery [phone]

## 2021-05-22 NOTE — Progress Notes (Signed)
Clarene Essex paged via amion to assist with orders for patient. Nurse awaiting response.

## 2021-05-23 ENCOUNTER — Encounter (HOSPITAL_COMMUNITY)
Admission: EM | Disposition: A | Discharge status: Skilled Nursing Facility | Payer: Self-pay | Source: Skilled Nursing Facility | Attending: Internal Medicine

## 2021-05-23 ENCOUNTER — Inpatient Hospital Stay (HOSPITAL_COMMUNITY): Payer: Medicare Other | Admitting: Certified Registered Nurse Anesthetist

## 2021-05-23 ENCOUNTER — Encounter (HOSPITAL_COMMUNITY): Payer: Self-pay | Admitting: Internal Medicine

## 2021-05-23 ENCOUNTER — Inpatient Hospital Stay (HOSPITAL_COMMUNITY): Payer: Medicare Other

## 2021-05-23 DIAGNOSIS — S72002A Fracture of unspecified part of neck of left femur, initial encounter for closed fracture: Secondary | ICD-10-CM | POA: Diagnosis not present

## 2021-05-23 HISTORY — PX: HIP PINNING,CANNULATED: SHX1758

## 2021-05-23 LAB — CBC
HCT: 25.6 % — ABNORMAL LOW (ref 39.0–52.0)
Hemoglobin: 8 g/dL — ABNORMAL LOW (ref 13.0–17.0)
MCH: 25 pg — ABNORMAL LOW (ref 26.0–34.0)
MCHC: 31.3 g/dL (ref 30.0–36.0)
MCV: 80 fL (ref 80.0–100.0)
Platelets: 452 10*3/uL — ABNORMAL HIGH (ref 150–400)
RBC: 3.2 MIL/uL — ABNORMAL LOW (ref 4.22–5.81)
RDW: 18.9 % — ABNORMAL HIGH (ref 11.5–15.5)
WBC: 8.6 10*3/uL (ref 4.0–10.5)
nRBC: 0 % (ref 0.0–0.2)

## 2021-05-23 LAB — COMPREHENSIVE METABOLIC PANEL
ALT: 9 U/L (ref 0–44)
AST: 13 U/L — ABNORMAL LOW (ref 15–41)
Albumin: 2.6 g/dL — ABNORMAL LOW (ref 3.5–5.0)
Alkaline Phosphatase: 74 U/L (ref 38–126)
Anion gap: 6 (ref 5–15)
BUN: 18 mg/dL (ref 8–23)
CO2: 25 mmol/L (ref 22–32)
Calcium: 8.1 mg/dL — ABNORMAL LOW (ref 8.9–10.3)
Chloride: 102 mmol/L (ref 98–111)
Creatinine, Ser: 1.16 mg/dL (ref 0.61–1.24)
GFR, Estimated: 58 mL/min — ABNORMAL LOW (ref >60)
Glucose, Bld: 95 mg/dL (ref 70–99)
Potassium: 3.8 mmol/L (ref 3.5–5.1)
Sodium: 133 mmol/L — ABNORMAL LOW (ref 135–145)
Total Bilirubin: 0.6 mg/dL (ref 0.3–1.2)
Total Protein: 6.3 g/dL — ABNORMAL LOW (ref 6.5–8.1)

## 2021-05-23 SURGERY — FIXATION, FEMUR, NECK, PERCUTANEOUS, USING SCREW
Anesthesia: Spinal | Site: Hip | Laterality: Left

## 2021-05-23 MED ORDER — METOCLOPRAMIDE HCL 5 MG/ML IJ SOLN: 5.0000 mg | Freq: Three times a day (TID) | INTRAMUSCULAR | Status: DC | PRN

## 2021-05-23 MED ORDER — SENNA 8.6 MG PO TABS
1.0000 | ORAL_TABLET | Freq: Two times a day (BID) | ORAL | Status: DC
  Administered 2021-05-23 – 2021-05-26 (×7): 8.6 mg via ORAL
  Filled 2021-05-23 (×10): qty 1

## 2021-05-23 MED ORDER — MENTHOL 3 MG MT LOZG: 1.0000 | LOZENGE | OROMUCOSAL | Status: DC | PRN

## 2021-05-23 MED ORDER — ONDANSETRON HCL 4 MG/2ML IJ SOLN: 4.0000 mg | Freq: Four times a day (QID) | INTRAMUSCULAR | Status: DC | PRN

## 2021-05-23 MED ORDER — CEFAZOLIN SODIUM-DEXTROSE 2-4 GM/100ML-% IV SOLN
2.0000 g | Freq: Four times a day (QID) | INTRAVENOUS | Status: AC
  Administered 2021-05-23 – 2021-05-24 (×2): 2 g via INTRAVENOUS
  Filled 2021-05-23 (×2): qty 100

## 2021-05-23 MED ORDER — FENTANYL CITRATE (PF) 100 MCG/2ML IJ SOLN
INTRAMUSCULAR | Status: AC
  Filled 2021-05-23: qty 2

## 2021-05-23 MED ORDER — TRANEXAMIC ACID-NACL 1000-0.7 MG/100ML-% IV SOLN
INTRAVENOUS | Status: DC | PRN
  Administered 2021-05-23: 1000 mg via INTRAVENOUS

## 2021-05-23 MED ORDER — DOCUSATE SODIUM 100 MG PO CAPS
100.0000 mg | ORAL_CAPSULE | Freq: Two times a day (BID) | ORAL | Status: DC
  Administered 2021-05-23 – 2021-05-26 (×7): 100 mg via ORAL
  Filled 2021-05-23 (×9): qty 1

## 2021-05-23 MED ORDER — EPHEDRINE SULFATE-NACL 50-0.9 MG/10ML-% IV SOSY
PREFILLED_SYRINGE | INTRAVENOUS | Status: DC | PRN
  Administered 2021-05-23: 10 mg via INTRAVENOUS

## 2021-05-23 MED ORDER — LIDOCAINE 2% (20 MG/ML) 5 ML SYRINGE
INTRAMUSCULAR | Status: DC | PRN
  Administered 2021-05-23: 1.8 mg via INTRAVENOUS

## 2021-05-23 MED ORDER — ONDANSETRON HCL 4 MG/2ML IJ SOLN: 4.0000 mg | Freq: Once | INTRAMUSCULAR | Status: DC | PRN

## 2021-05-23 MED ORDER — 0.9 % SODIUM CHLORIDE (POUR BTL) OPTIME
TOPICAL | Status: DC | PRN
  Administered 2021-05-23: 1000 mL

## 2021-05-23 MED ORDER — ASPIRIN EC 325 MG PO TBEC
325.0000 mg | DELAYED_RELEASE_TABLET | Freq: Every day | ORAL | Status: DC
  Administered 2021-05-24 – 2021-05-28 (×5): 325 mg via ORAL
  Filled 2021-05-23 (×5): qty 1

## 2021-05-23 MED ORDER — PROPOFOL 500 MG/50ML IV EMUL
INTRAVENOUS | Status: DC | PRN
  Administered 2021-05-23: 40 ug/kg/min via INTRAVENOUS
  Administered 2021-05-23: 30 mg via INTRAVENOUS

## 2021-05-23 MED ORDER — FENTANYL CITRATE (PF) 100 MCG/2ML IJ SOLN
INTRAMUSCULAR | Status: DC | PRN
  Administered 2021-05-23: 25 ug via INTRAVENOUS

## 2021-05-23 MED ORDER — ONDANSETRON HCL 4 MG PO TABS: 4.0000 mg | ORAL_TABLET | Freq: Four times a day (QID) | ORAL | Status: DC | PRN

## 2021-05-23 MED ORDER — PHENOL 1.4 % MT LIQD: 1.0000 | OROMUCOSAL | Status: DC | PRN

## 2021-05-23 MED ORDER — TRANEXAMIC ACID-NACL 1000-0.7 MG/100ML-% IV SOLN
1000.0000 mg | Freq: Once | INTRAVENOUS | Status: AC
  Administered 2021-05-23: 1000 mg via INTRAVENOUS
  Filled 2021-05-23: qty 100

## 2021-05-23 MED ORDER — FENTANYL CITRATE PF 50 MCG/ML IJ SOSY: 25.0000 ug | PREFILLED_SYRINGE | INTRAMUSCULAR | Status: DC | PRN

## 2021-05-23 MED ORDER — METOCLOPRAMIDE HCL 5 MG PO TABS: 5.0000 mg | ORAL_TABLET | Freq: Three times a day (TID) | ORAL | Status: DC | PRN

## 2021-05-23 SURGICAL SUPPLY — 45 items
ADH SKN CLS APL DERMABOND .7 (GAUZE/BANDAGES/DRESSINGS) ×1
APL PRP STRL LF DISP 70% ISPRP (MISCELLANEOUS) ×1
BAG COUNTER SPONGE SURGICOUNT (BAG) IMPLANT
BAG SPEC THK2 15X12 ZIP CLS (MISCELLANEOUS)
BAG SPNG CNTER NS LX DISP (BAG)
BAG ZIPLOCK 12X15 (MISCELLANEOUS) IMPLANT
BIT DRILL 4.8X300 (BIT) ×1 IMPLANT
CHLORAPREP W/TINT 26 (MISCELLANEOUS) ×2 IMPLANT
COVER PERINEAL POST (MISCELLANEOUS) ×2 IMPLANT
COVER SURGICAL LIGHT HANDLE (MISCELLANEOUS) ×2 IMPLANT
DERMABOND ADVANCED (GAUZE/BANDAGES/DRESSINGS) ×1
DERMABOND ADVANCED .7 DNX12 (GAUZE/BANDAGES/DRESSINGS) ×1 IMPLANT
DRAPE C-ARM 42X120 X-RAY (DRAPES) ×2 IMPLANT
DRAPE C-ARMOR (DRAPES) ×2 IMPLANT
DRAPE IMP U-DRAPE 54X76 (DRAPES) ×4 IMPLANT
DRAPE SHEET LG 3/4 BI-LAMINATE (DRAPES) ×4 IMPLANT
DRAPE STERI IOBAN 125X83 (DRAPES) ×2 IMPLANT
DRAPE U-SHAPE 47X51 STRL (DRAPES) ×4 IMPLANT
DRSG AQUACEL AG ADV 3.5X 4 (GAUZE/BANDAGES/DRESSINGS) ×1 IMPLANT
DRSG AQUACEL AG ADV 3.5X 6 (GAUZE/BANDAGES/DRESSINGS) ×1 IMPLANT
ELECT REM PT RETURN 15FT ADLT (MISCELLANEOUS) ×2 IMPLANT
GLOVE SRG 8 PF TXTR STRL LF DI (GLOVE) ×1 IMPLANT
GLOVE SURG ENC MOIS LTX SZ8.5 (GLOVE) ×4 IMPLANT
GLOVE SURG ENC TEXT LTX SZ7.5 (GLOVE) ×4 IMPLANT
GLOVE SURG UNDER POLY LF SZ8 (GLOVE) ×2
GLOVE SURG UNDER POLY LF SZ8.5 (GLOVE) ×2 IMPLANT
GOWN STRL REUS W/TWL 2XL LVL3 (GOWN DISPOSABLE) ×2 IMPLANT
GUIDE PIN DRILL TIP 2.8X450HIP (PIN) ×6
JET LAVAGE IRRISEPT WOUND (IRRIGATION / IRRIGATOR)
KIT BASIN OR (CUSTOM PROCEDURE TRAY) ×2 IMPLANT
KIT TURNOVER KIT A (KITS) ×2 IMPLANT
LAVAGE JET IRRISEPT WOUND (IRRIGATION / IRRIGATOR) IMPLANT
MANIFOLD NEPTUNE II (INSTRUMENTS) ×2 IMPLANT
NS IRRIG 1000ML POUR BTL (IV SOLUTION) ×2 IMPLANT
PACK GENERAL/GYN (CUSTOM PROCEDURE TRAY) ×2 IMPLANT
PIN GUIDE DRILL TIP 2.8X450HIP (PIN) IMPLANT
PROTECTOR NERVE ULNAR (MISCELLANEOUS) ×2 IMPLANT
SCREW CANN 8.0X100 HIP (Screw) ×3 IMPLANT
STAPLER VISISTAT 35W (STAPLE) ×1 IMPLANT
SUT MNCRL AB 3-0 PS2 18 (SUTURE) ×1 IMPLANT
SUT MON AB 2-0 CT1 36 (SUTURE) ×2 IMPLANT
SUT VIC AB 2-0 CT1 27 (SUTURE)
SUT VIC AB 2-0 CT1 TAPERPNT 27 (SUTURE) ×2 IMPLANT
SUT VICRYL 0 UR6 27IN ABS (SUTURE) ×1 IMPLANT
TOWEL OR 17X26 10 PK STRL BLUE (TOWEL DISPOSABLE) ×2 IMPLANT

## 2021-05-23 NOTE — Anesthesia Procedure Notes (Signed)
Spinal  Patient location during procedure: OR Start time: 05/23/2021 3:23 PM End time: 05/23/2021 3:29 PM Reason for block: surgical anesthesia Staffing Performed: anesthesiologist  Anesthesiologist: Josephine Igo, MD Preanesthetic Checklist Completed: patient identified, IV checked, site marked, risks and benefits discussed, surgical consent, monitors and equipment checked, pre-op evaluation and timeout performed Spinal Block Patient position: left lateral decubitus Prep: DuraPrep and site prepped and draped Patient monitoring: heart rate, cardiac monitor, continuous pulse ox and blood pressure Approach: midline Location: L3-4 Injection technique: single-shot Needle Needle type: Pencan  Needle gauge: 24 G Needle length: 9 cm Assessment Sensory level: T4 Events: CSF return Additional Notes Patient tolerated procedure well. Technically difficult.

## 2021-05-23 NOTE — Plan of Care (Signed)
  Problem: Education: Goal: Verbalization of understanding the information provided (i.e., activity precautions, restrictions, etc) will improve Outcome: Progressing   Problem: Pain Management: Goal: Pain level will decrease Outcome: Progressing

## 2021-05-23 NOTE — Plan of Care (Signed)
  Problem: Pain Management: Goal: Pain level will decrease Outcome: Progressing   

## 2021-05-23 NOTE — Progress Notes (Signed)
PROGRESS NOTE    [name]  NKN:[id number] DOB: Dec 31, 1924 DOA: 05/21/2021 PCP: [name], MD   Brief Narrative:  [name] is a 87 y.o. male with medical history significant of BPH, spermatocele, bladder diverticuli, rhabdomyolysis, history of AKI, normocytic anemia, osteoarthritis, cerebral atrophy, history of cerebrovascular disease, scoliosis of C-spine and lumbar spine, lumbar DJD, GERD, history of Mallory-Weiss tear, hypercalcemia, osteoporosis, hyperlipidemia, hypertension, history of herpes zoster with chronic indwelling Foley and PEG tube who presents to the ED with worsening hip pain and ambulatory dysfunction noted to have left subcapital femoral fracture on imaging with fall most recently 3 weeks ago concerning for subacute injury.  Orthopedics consulted at intake for further evaluation and treatment TRH consulted for admission.   Assessment & Plan:   Closed left hip fracture ([place]) -Orthopedics following, plan for ORIF later today for definitive management of closed left hip fracture  -Resume diet postoperatively once patient awake alert and able to tolerate p.o. safely  -Pain currently well controlled on current regimen, continue as is  -PT OT to follow afterwards, likely discharge back to current nursing facility for ongoing PT   PEG tube placement, chronic Patient extremely poor historian given baseline dementia and advanced age  General surgery consulted at intake due to concern by staff that PEG tube is "loose"  -appears to be intact at bedside today  Continue to advance p.o. diet as tolerated   GERD (gastroesophageal reflux disease) Continue pantoprazole 40 mg p.o. daily.   Protein-calorie malnutrition, moderate (HCC) Protein supplementation. Consult nutritional services.   CKD (chronic kidney disease), stage III (HCC) History of BPH. Has permanent Foley. Monitor electrolytes and GFR.   Hypothyroidism Continue levothyroxine 50 mcg p.o. daily.    Cognitive impairment Supportive care.   IDA (iron deficiency anemia) Monitor H&H. Transfuse as needed.   DVT prophylaxis:      SCDs. Code Status:              DNR. Family Communication:       None at bedside.  Status is: Inpatient  Dispo: The patient is from: SNF              Anticipated d/c is to: SNF              Anticipated d/c date is: 36 to 72 hours pending clinical course              Patient currently not medically stable for discharge  Consultants:  Orthopedic surgery  Procedures:  ORIF, left hip 05/23/2021  Antimicrobials:  None  Subjective: No acute issues or events overnight, patient markedly poor historian but review of systems negative for chest pain shortness of breath nausea vomiting diarrhea or constipation  Objective: Vitals:   05/22/21 1441 05/22/21 2048 05/23/21 0555 05/23/21 0731  BP: (!) 156/79 (!) 162/84 (!) 151/76 (!) 160/53  Pulse: 72 78 65 (!) 57  Resp: 18 16 16 16   Temp: 98.1 F (36.7 C) 99.5 F (37.5 C) 98.3 F (36.8 C) (!) 97.5 F (36.4 C)  TempSrc: Oral Oral Oral Oral  SpO2: 94% 97% 96%   Weight:      Height:        Intake/Output Summary (Last 24 hours) at 05/23/2021 0739 Last data filed at 05/23/2021 0600 Gross per 24 hour  Intake 1271.25 ml  Output 1500 ml  Net -228.75 ml   Filed Weights   05/22/21 0032  Weight: 60.5 kg    Examination:  General:  Pleasantly resting in bed,  No acute distress. HEENT:  Normocephalic atraumatic.  Sclerae nonicteric, noninjected.  Extraocular movements intact bilaterally. Neck:  Without mass or deformity.  Trachea is midline. Lungs:  Clear to auscultate bilaterally without rhonchi, wheeze, or rales. Heart:  Regular rate and rhythm.  Without murmurs, rubs, or gallops. Abdomen:  Soft, nontender, nondistended.  Without guarding or rebound. PEG tube clean dry intact, foley catheter intact Extremities: Without cyanosis, clubbing, edema, or obvious deformity. Vascular:  Dorsalis pedis and  posterior tibial pulses palpable bilaterally. Skin:  Warm and dry, no erythema, no ulcerations.  Data Reviewed: I have personally reviewed following labs and imaging studies  CBC: Recent Labs  Lab 05/21/21 2007 05/22/21 0538 05/23/21 0328  WBC 11.2* 8.9 8.6  NEUTROABS 7.4  --   --   HGB 9.5* 8.2* 8.0*  HCT 31.0* 25.5* 25.6*  MCV 80.3 81.0 80.0  PLT 533* 429* 213*   Basic Metabolic Panel: Recent Labs  Lab 05/21/21 2022 05/22/21 0538 05/23/21 0328  NA 134* 133* 133*  K 3.6 3.6 3.8  CL 99 99 102  CO2 25 25 25   GLUCOSE 101* 94 95  BUN 18 16 18   CREATININE 1.12 1.17 1.16  CALCIUM 8.5* 7.9* 8.1*   GFR: Estimated Creatinine Clearance: 33.3 mL/min (by C-G formula based on SCr of 1.16 mg/dL). Liver Function Tests: Recent Labs  Lab 05/22/21 0538 05/23/21 0328  AST 15 13*  ALT 9 9  ALKPHOS 70 74  BILITOT 0.5 0.6  PROT 6.3* 6.3*  ALBUMIN 2.6* 2.6*   No results for input(s): LIPASE, AMYLASE in the last 168 hours. No results for input(s): AMMONIA in the last 168 hours. Coagulation Profile: No results for input(s): INR, PROTIME in the last 168 hours. Cardiac Enzymes: No results for input(s): CKTOTAL, CKMB, CKMBINDEX, TROPONINI in the last 168 hours. BNP (last 3 results) No results for input(s): PROBNP in the last 8760 hours. HbA1C: No results for input(s): HGBA1C in the last 72 hours. CBG: No results for input(s): GLUCAP in the last 168 hours. Lipid Profile: No results for input(s): CHOL, HDL, LDLCALC, TRIG, CHOLHDL, LDLDIRECT in the last 72 hours. Thyroid Function Tests: No results for input(s): TSH, T4TOTAL, FREET4, T3FREE, THYROIDAB in the last 72 hours. Anemia Panel: No results for input(s): VITAMINB12, FOLATE, FERRITIN, TIBC, IRON, RETICCTPCT in the last 72 hours. Sepsis Labs: No results for input(s): PROCALCITON, LATICACIDVEN in the last 168 hours.  Recent Results (from the past 240 hour(s))  Resp Panel by RT-PCR (Flu A&B, Covid) Nasopharyngeal Swab      Status: None   Collection Time: 05/21/21  8:07 PM   Specimen: Nasopharyngeal Swab; Nasopharyngeal(NP) swabs in vial transport medium  Result Value Ref Range Status   SARS Coronavirus 2 by RT PCR NEGATIVE NEGATIVE Final    Comment: (NOTE) SARS-CoV-2 target nucleic acids are NOT DETECTED.  The SARS-CoV-2 RNA is generally detectable in upper respiratory specimens during the acute phase of infection. The lowest concentration of SARS-CoV-2 viral copies this assay can detect is 138 copies/mL. A negative result does not preclude SARS-Cov-2 infection and should not be used as the sole basis for treatment or other patient management decisions. A negative result may occur with  improper specimen collection/handling, submission of specimen other than nasopharyngeal swab, presence of viral mutation(s) within the areas targeted by this assay, and inadequate number of viral copies(<138 copies/mL). A negative result must be combined with clinical observations, patient history, and epidemiological information. The expected result is Negative.  Fact Sheet for Patients:  EntrepreneurPulse.com.au  Fact Sheet for Healthcare Providers:  IncredibleEmployment.be  This test is no t yet approved or cleared by the [place] FDA and  has been authorized for detection and/or diagnosis of SARS-CoV-2 by FDA under an Emergency Use Authorization (EUA). This EUA will remain  in effect (meaning this test can be used) for the duration of the COVID-19 declaration under Section 564(b)(1) of the Act, 21 U.S.C.section 360bbb-3(b)(1), unless the authorization is terminated  or revoked sooner.       Influenza A by PCR NEGATIVE NEGATIVE Final   Influenza B by PCR NEGATIVE NEGATIVE Final    Comment: (NOTE) The Xpert Xpress SARS-CoV-2/FLU/RSV plus assay is intended as an aid in the diagnosis of influenza from Nasopharyngeal swab specimens and should not be used as a sole basis  for treatment. Nasal washings and aspirates are unacceptable for Xpert Xpress SARS-CoV-2/FLU/RSV testing.  Fact Sheet for Patients: EntrepreneurPulse.com.au  Fact Sheet for Healthcare Providers: IncredibleEmployment.be  This test is not yet approved or cleared by the [place] FDA and has been authorized for detection and/or diagnosis of SARS-CoV-2 by FDA under an Emergency Use Authorization (EUA). This EUA will remain in effect (meaning this test can be used) for the duration of the COVID-19 declaration under Section 564(b)(1) of the Act, 21 U.S.C. section 360bbb-3(b)(1), unless the authorization is terminated or revoked.  Performed at [facility], [address], [place], [place] [address]   Surgical pcr screen     Status: None   Collection Time: 05/22/21  3:44 PM   Specimen: Nasal Mucosa; Nasal Swab  Result Value Ref Range Status   MRSA, PCR NEGATIVE NEGATIVE Final   Staphylococcus aureus NEGATIVE NEGATIVE Final    Comment: (NOTE) The Xpert SA Assay (FDA approved for NASAL specimens in patients 77 years of age and older), is one component of a comprehensive surveillance program. It is not intended to diagnose infection nor to guide or monitor treatment. Performed at [facility], [place] [address] [place], [place] [address]          Radiology Studies: CT HIP LEFT WO CONTRAST  Result Date: 05/22/2021 CLINICAL DATA:  Hip trauma, fracture suspected. Patient fell 3 weeks ago and is unable to bear weight. EXAM: CT OF THE LEFT HIP WITHOUT CONTRAST TECHNIQUE: Multidetector CT imaging of the left hip was performed according to the standard protocol. Multiplanar CT image reconstructions were also generated. COMPARISON:  Radiographs 05/21/2021 and pelvic CT 11/24/2020. FINDINGS: Bones/Joint/Cartilage The bones appear adequately mineralized. The femoral head is located. There is a minimally displaced  subcapital fracture of the left femoral neck with minimal valgus angulation. This fracture demonstrates no intertrochanteric extension. There are underlying moderate left hip degenerative changes with a small joint effusion. The visualized left hemipelvis is intact. Ligaments Suboptimally assessed by CT. Muscles and Tendons Mild generalized muscular atrophy without focal intramuscular hematoma. Soft tissues Iliofemoral atherosclerosis. No significant periarticular hematoma. Prominent stool in the rectum noted. IMPRESSION: CT confirms the presence of a mildly displaced and angulated subcapital fracture of the left femoral neck. Underlying moderate left hip degenerative changes. Electronically Signed   By: Richardean Sale M.D.   On: 05/22/2021 14:50   DG Chest Port 1 View  Result Date: 05/21/2021 CLINICAL DATA:  Preop.  Left hip fracture. EXAM: PORTABLE CHEST 1 VIEW COMPARISON:  11/24/2020 FINDINGS: Normal heart size. No pericardial effusion. Scratch set no pleural effusion or edema. No airspace opacities identified. Calcified granuloma is again noted within the lingula. The visualized osseous structures  are unremarkable. IMPRESSION: No acute cardiopulmonary abnormalities. Electronically Signed   By: Kerby Moors M.D.   On: 05/21/2021 19:33   DG Hip Unilat W or Wo Pelvis 2-3 Views Left  Result Date: 05/21/2021 CLINICAL DATA:  Hip pain, fall 3 weeks ago EXAM: DG HIP (WITH OR WITHOUT PELVIS) 2-3V LEFT COMPARISON:  CT 11/24/2020 FINDINGS: Findings are suspicious for subacute nondisplaced left subcapital femoral neck fracture. No femoral head dislocation. Mild degenerative changes of both hips. Pubic symphysis and rami are intact. IMPRESSION: Findings suspicious for subacute left subcapital femoral neck fracture Electronically Signed   By: Donavan Foil M.D.   On: 05/21/2021 18:48        Scheduled Meds:  Chlorhexidine Gluconate Cloth  6 each Topical Daily   cholestyramine light  4 g Oral BID   feeding  supplement  237 mL Oral TID BM   iron polysaccharides  150 mg Oral Q breakfast   levothyroxine  50 mcg Oral QAC breakfast   melatonin  5 mg Oral QHS   multivitamin with minerals  1 tablet Oral Daily   pantoprazole  40 mg Oral Daily   povidone-iodine   Topical Once   povidone-iodine  2 application Topical Once   saccharomyces boulardii  250 mg Oral BID   senna-docusate  1 tablet Oral BID   tamsulosin  0.4 mg Oral Daily   Continuous Infusions:   ceFAZolin (ANCEF) IV     lactated ringers 75 mL/hr at 05/23/21 0420     LOS: 1 day   Time spent: 56min  Cortana Vanderford C Charlesetta Milliron, DO [facility]  If 7PM-7AM, please contact night-coverage [email or web address]  05/23/2021, 7:39 AM

## 2021-05-23 NOTE — Anesthesia Postprocedure Evaluation (Signed)
Anesthesia Post Note  Patient: [name]  Procedure(s) Performed: CANNULATED HIP PINNING (Left: Hip)     Patient location during evaluation: PACU Anesthesia Type: Spinal Level of consciousness: oriented and awake and alert Pain management: pain level controlled Vital Signs Assessment: post-procedure vital signs reviewed and stable Respiratory status: spontaneous breathing, respiratory function stable and nonlabored ventilation Cardiovascular status: blood pressure returned to baseline and stable Postop Assessment: no headache, no backache, no apparent nausea or vomiting, spinal receding and patient able to bend at knees Anesthetic complications: no   No notable events documented.  Last Vitals:  Vitals:   05/23/21 1800 05/23/21 1826  BP: 138/77 (!) 155/88  Pulse: 70 71  Resp: 20 18  Temp:    SpO2: 99% 99%                  Leandre Wien A.

## 2021-05-23 NOTE — Transfer of Care (Signed)
Immediate Anesthesia Transfer of Care Note  Patient: [name]  Procedure(s) Performed: Procedure(s) with comments: CANNULATED HIP PINNING (Left) - hanna large carm  Patient Location: PACU  Anesthesia Type:Spinal  Level of Consciousness: awake, alert  and oriented  Airway & Oxygen Therapy: Patient Spontanous Breathing  Post-op Assessment: Report given to RN and Post -op Vital signs reviewed and stable  Post vital signs: Reviewed and stable  Last Vitals:  Vitals:   05/23/21 0731 05/23/21 1249  BP: (!) 160/53 (!) 142/65  Pulse: (!) 57 (!) 50  Resp: 16 15  Temp: (!) 36.4 C   SpO2: 38% 33%    Complications: No apparent anesthesia complications

## 2021-05-23 NOTE — Anesthesia Preprocedure Evaluation (Addendum)
Anesthesia Evaluation  Patient identified by MRN, date of birth, ID band Patient awake    Reviewed: Allergy & Precautions, NPO status , Patient's Chart, lab work & pertinent test results, reviewed documented beta blocker date and time   Airway Mallampati: II  TM Distance: >3 FB Neck ROM: Full    Dental no notable dental hx. (+) Dental Advisory Given   Pulmonary former smoker,    Pulmonary exam normal breath sounds clear to auscultation       Cardiovascular hypertension, Pt. on medications Normal cardiovascular exam Rhythm:Regular Rate:Normal  EKG 05/23/21 NSR, RAD, multiple premature complexes  Echo 11/27/20 1. Left ventricular ejection fraction, by estimation, is 45 to 50%. The left ventricle has mildly decreased function. The left ventricle has no regional wall motion abnormalities. There is mild left ventricular hypertrophy. Left ventricular diastolic parameters are consistent with Grade I diastolic dysfunction (impaired relaxation).  2. Right ventricular systolic function is normal. The right ventricular size is normal. There is normal pulmonary artery systolic pressure.  3. The mitral valve is grossly normal. Mild mitral valve regurgitation.  4. The aortic valve is normal in structure. Aortic valve regurgitation is not visualized. No aortic stenosis is present.  5. Aortic dilatation noted. There is mild dilatation of the ascending aorta, measuring 41 mm.    Neuro/Psych  Neuromuscular disease negative psych ROS   GI/Hepatic Neg liver ROS, hiatal hernia, PUD, GERD  Medicated and Controlled,Hx/o Mallory-Weiss tear S/P PEG   Endo/Other  Hypothyroidism   Renal/GU Renal InsufficiencyRenal disease   BPH    Musculoskeletal  (+) Arthritis , Osteoarthritis,  Left Femoral neck Fx Cervical and lumbar scoliosis Osteoporosis   Abdominal   Peds  Hematology  (+) anemia ,   Anesthesia Other Findings    Reproductive/Obstetrics                            Anesthesia Physical Anesthesia Plan  ASA: 3  Anesthesia Plan: Spinal   Post-op Pain Management:    Induction: Intravenous  PONV Risk Score and Plan: 3 and Treatment may vary due to age or medical condition, Ondansetron and Propofol infusion  Airway Management Planned: Natural Airway and Simple Face Mask  Additional Equipment:   Intra-op Plan:   Post-operative Plan:   Informed Consent: I have reviewed the patients History and Physical, chart, labs and discussed the procedure including the risks, benefits and alternatives for the proposed anesthesia with the patient or authorized representative who has indicated his/her understanding and acceptance.   Patient has DNR.  Discussed DNR with patient and Suspend DNR.   Dental advisory given  Plan Discussed with: CRNA and Anesthesiologist  Anesthesia Plan Comments:         Anesthesia Quick Evaluation

## 2021-05-23 NOTE — Interval H&P Note (Signed)
History and Physical Interval Note:  05/23/2021 2:46 PM  [name]  has presented today for surgery, with the diagnosis of HIP FRACTURE.  The various methods of treatment have been discussed with the patient and family. After consideration of risks, benefits and other options for treatment, the patient has consented to  Procedure(s) with comments: CANNULATED HIP PINNING (Left) - hanna large carm as a surgical intervention.  The patient's history has been reviewed, patient examined, no change in status, stable for surgery.  I have reviewed the patient's chart and labs.  Questions were answered to the patient's satisfaction.     Hilton Cork Meena Barrantes

## 2021-05-23 NOTE — Op Note (Signed)
OPERATIVE REPORT  SURGEON: Rod Can, MD   ASSISTANT: Almira Coaster, RNFA  PREOPERATIVE DIAGNOSIS: Left femoral neck fracture.   POSTOPERATIVE DIAGNOSIS: Left femoral neck fracture.   PROCEDURE: Percutaneous screw fixation, Left femoral neck.   IMPLANTS: Biomet 8.0 mm cannulated screws 3.  ANESTHESIA:  MAC and Spinal  ESTIMATED BLOOD LOSS:-50 mL    ANTIBIOTICS:  2 g Ancef.  DRAINS: None.  COMPLICATIONS: None.   CONDITION: PACU - hemodynamically stable.Marland Kitchen   BRIEF CLINICAL NOTE: [name] is a 87 y.o. male who presented with a subacute femoral neck fracture. The patient was admitted to the hospitalist service and underwent perioperative risk stratification and medical optimization. The risks, benefits, and alternatives to the procedure were explained, and the patient elected to proceed.  PROCEDURE IN DETAIL: Surgical site was marked by myself. The patient was taken to the operating room and anesthesia was induced on the bed. The patient was then transferred to the [facility] table and the nonoperative lower extremity was scissored underneath the operative side. The hip was prepped and draped in the normal sterile surgical fashion. Timeout was called verifying side and site of surgery. Preop antibiotics were given with 60 minutes of beginning the procedure.  Fluoroscopy was used to define the patient's anatomy. A 2 cm incision was made over the lateral aspect of the proximal femur. Guidepin was placed inferiorly on the AP x-ray, and centrally on the lateral x-ray. 2 additional guide pins were placed proximal to the first pin, one anterior and one posterior. Pin position was confirmed with biplanar fluoroscopy. The pins were sequentially measured, the near cortex was drilled, and the screws were placed. Final AP and lateral fluoroscopy views were obtained to confirm fracture reduction and hardware placement. There was no chondral penetration.  The wounds were copiously irrigated with  saline. The wound was closed in layers with #1 Vicryl for the fascia, 2-0 Monocryl for the deep dermal layer, and 3-0 Monocryl subcuticular stitch. Glue was applied to the skin. Once the glue was fully hardened, sterile dressing was applied. The patient was then awakened from anesthesia and taken to the PACU in stable condition. Sponge needle and instrument counts were correct at the end of the case 2. There were no known complications.  We will readmit the patient to the hospitalist. Weightbearing status will be weightbearing as tolerated with a walker. We will begin ASA for DVT prophylaxis. The patient will work with physical therapy and undergo disposition planning.

## 2021-05-24 DIAGNOSIS — S72002A Fracture of unspecified part of neck of left femur, initial encounter for closed fracture: Secondary | ICD-10-CM | POA: Diagnosis not present

## 2021-05-24 LAB — BASIC METABOLIC PANEL
Anion gap: 6 (ref 5–15)
BUN: 19 mg/dL (ref 8–23)
CO2: 25 mmol/L (ref 22–32)
Calcium: 8.4 mg/dL — ABNORMAL LOW (ref 8.9–10.3)
Chloride: 103 mmol/L (ref 98–111)
Creatinine, Ser: 1.28 mg/dL — ABNORMAL HIGH (ref 0.61–1.24)
GFR, Estimated: 52 mL/min — ABNORMAL LOW (ref >60)
Glucose, Bld: 163 mg/dL — ABNORMAL HIGH (ref 70–99)
Potassium: 3.9 mmol/L (ref 3.5–5.1)
Sodium: 134 mmol/L — ABNORMAL LOW (ref 135–145)

## 2021-05-24 LAB — CBC
HCT: 26.1 % — ABNORMAL LOW (ref 39.0–52.0)
Hemoglobin: 8 g/dL — ABNORMAL LOW (ref 13.0–17.0)
MCH: 24.7 pg — ABNORMAL LOW (ref 26.0–34.0)
MCHC: 30.7 g/dL (ref 30.0–36.0)
MCV: 80.6 fL (ref 80.0–100.0)
Platelets: 497 10*3/uL — ABNORMAL HIGH (ref 150–400)
RBC: 3.24 MIL/uL — ABNORMAL LOW (ref 4.22–5.81)
RDW: 19.2 % — ABNORMAL HIGH (ref 11.5–15.5)
WBC: 7.9 10*3/uL (ref 4.0–10.5)
nRBC: 0 % (ref 0.0–0.2)

## 2021-05-24 MED ORDER — CHLORHEXIDINE GLUCONATE CLOTH 2 % EX PADS
6.0000 | MEDICATED_PAD | Freq: Every day | CUTANEOUS | Status: DC
  Administered 2021-05-25 – 2021-05-28 (×4): 6 via TOPICAL

## 2021-05-24 NOTE — Progress Notes (Signed)
Subjective: 1 Day Post-Op Procedure(s) (LRB): CANNULATED HIP PINNING (Left) Patient reports pain as mild.    Objective: Vital signs in last 24 hours: Temp:  [97.4 F (36.3 C)-98.3 F (36.8 C)] 97.9 F (36.6 C) (10/07 1020) Pulse Rate:  [50-104] 62 (10/07 1020) Resp:  [15-20] 17 (10/07 0517) BP: (124-160)/(64-112) 145/72 (10/07 1020) SpO2:  [86 %-100 %] 97 % (10/07 1020)  Intake/Output from previous day: 10/06 0701 - 10/07 0700 In: 1680 [P.O.:580; I.V.:700; IV Piggyback:400] Out: 1950 [Urine:1900; Blood:50] Intake/Output this shift: No intake/output data recorded.  Recent Labs    05/21/21 2007 05/22/21 0538 05/23/21 0328 05/24/21 0331  HGB 9.5* 8.2* 8.0* 8.0*   Recent Labs    05/23/21 0328 05/24/21 0331  WBC 8.6 7.9  RBC 3.20* 3.24*  HCT 25.6* 26.1*  PLT 452* 497*   Recent Labs    05/23/21 0328 05/24/21 0331  NA 133* 134*  K 3.8 3.9  CL 102 103  CO2 25 25  BUN 18 19  CREATININE 1.16 1.28*  GLUCOSE 95 163*  CALCIUM 8.1* 8.4*   No results for input(s): LABPT, INR in the last 72 hours.  Neurovascular intact Sensation intact distally Intact pulses distally Dorsiflexion/Plantar flexion intact Incision: dressing C/D/I   Assessment/Plan: 1 Day Post-Op Procedure(s) (LRB): CANNULATED HIP PINNING (Left) Discharge to SNF as per Medicine  WBAT Left lower extremity with walker Aspirin 81 mg BID for DVT prophylaxis Follow up 2 weeks post op with Dr Burnis Kingfisher Hatim Homann 05/24/2021, 11:31 AM [phone]

## 2021-05-24 NOTE — Progress Notes (Signed)
PROGRESS NOTE    [name]  YPP:[id number] DOB: [birth year] DOA: 05/21/2021 PCP: [name], MD   Brief Narrative:  [name] is a 87 y.o. male with medical history significant of BPH, spermatocele, bladder diverticuli, rhabdomyolysis, history of AKI, normocytic anemia, osteoarthritis, cerebral atrophy, history of cerebrovascular disease, scoliosis of C-spine and lumbar spine, lumbar DJD, GERD, history of Mallory-Weiss tear, hypercalcemia, osteoporosis, hyperlipidemia, hypertension, history of herpes zoster with chronic indwelling Foley and PEG tube who presents to the ED with worsening hip pain and ambulatory dysfunction noted to have left subcapital femoral fracture on imaging with fall most recently 3 weeks ago concerning for subacute injury.  Orthopedics consulted at intake for further evaluation and treatment TRH consulted for admission.   Assessment & Plan:   Closed left hip fracture ([place]) -Orthopedics following, status post ORIF 05/23/21 w/ Ortho ([place])  -Pain currently well controlled on current regimen, continue as is  -PT OT to follow afterwards, likely discharge back to current nursing facility for ongoing PT   PEG tube placement, chronic Patient extremely poor historian given baseline dementia and advanced age  General surgery consulted at intake due to concern by staff that PEG tube is "loose"  -appears to be intact at bedside today  Continue to advance p.o. diet as tolerated  GERD (gastroesophageal reflux disease) Continue pantoprazole 40 mg p.o. daily.   Protein-calorie malnutrition, moderate (HCC) Protein supplementation. Consult nutritional services.   CKD (chronic kidney disease), stage III (HCC) History of BPH. Has permanent Foley. Monitor electrolytes and GFR.   Hypothyroidism Continue levothyroxine 50 mcg p.o. daily.   Cognitive impairment Supportive care.   IDA (iron deficiency anemia) Monitor H&H. Transfuse as needed.   DVT prophylaxis:       SCDs. Code Status:              DNR. Family Communication:   Daughter at bedside  Status is: Inpatient  Dispo: The patient is from: SNF              Anticipated d/c is to: SNF              Anticipated d/c date is: 7 to 72 hours pending clinical course              Patient currently not medically stable for discharge  Consultants:  Orthopedic surgery  Procedures:  ORIF, left hip 05/23/2021  Antimicrobials:  None  Subjective: No acute issues or events overnight, patient markedly poor historian but review of systems negative for chest pain shortness of breath nausea vomiting diarrhea or constipation  Objective: Vitals:   05/23/21 1800 05/23/21 1826 05/23/21 2046 05/24/21 0517  BP: 138/77 (!) 155/88 129/71 139/82  Pulse: 70 71 91 64  Resp: 20 18 17 17   Temp:   97.8 F (36.6 C) 98.3 F (36.8 C)  TempSrc:      SpO2: 99% 99% 98% 96%  Weight:      Height:        Intake/Output Summary (Last 24 hours) at 05/24/2021 0739 Last data filed at 05/24/2021 0600 Gross per 24 hour  Intake 1680 ml  Output 1950 ml  Net -270 ml    Filed Weights   05/22/21 0032  Weight: 60.5 kg    Examination:  General:  Pleasantly resting in bed, No acute distress. HEENT:  Normocephalic atraumatic.  Sclerae nonicteric, noninjected.  Extraocular movements intact bilaterally. Neck:  Without mass or deformity.  Trachea is midline. Lungs:  Clear to auscultate bilaterally without rhonchi, wheeze,  or rales. Heart:  Regular rate and rhythm.  Without murmurs, rubs, or gallops. Abdomen:  Soft, nontender, nondistended.  Without guarding or rebound. PEG tube clean dry intact, foley catheter intact Extremities: Without cyanosis, clubbing, edema, or obvious deformity. Vascular:  Dorsalis pedis and posterior tibial pulses palpable bilaterally. Skin:  Warm and dry, no erythema, no ulcerations.  Data Reviewed: I have personally reviewed following labs and imaging studies  CBC: Recent Labs  Lab  05/21/21 2007 05/22/21 0538 05/23/21 0328 05/24/21 0331  WBC 11.2* 8.9 8.6 7.9  NEUTROABS 7.4  --   --   --   HGB 9.5* 8.2* 8.0* 8.0*  HCT 31.0* 25.5* 25.6* 26.1*  MCV 80.3 81.0 80.0 80.6  PLT 533* 429* 452* 497*    Basic Metabolic Panel: Recent Labs  Lab 05/21/21 2022 05/22/21 0538 05/23/21 0328 05/24/21 0331  NA 134* 133* 133* 134*  K 3.6 3.6 3.8 3.9  CL 99 99 102 103  CO2 25 25 25 25   GLUCOSE 101* 94 95 163*  BUN 18 16 18 19   CREATININE 1.12 1.17 1.16 1.28*  CALCIUM 8.5* 7.9* 8.1* 8.4*    GFR: Estimated Creatinine Clearance: 30.2 mL/min (A) (by C-G formula based on SCr of 1.28 mg/dL (H)). Liver Function Tests: Recent Labs  Lab 05/22/21 0538 05/23/21 0328  AST 15 13*  ALT 9 9  ALKPHOS 70 74  BILITOT 0.5 0.6  PROT 6.3* 6.3*  ALBUMIN 2.6* 2.6*    No results for input(s): LIPASE, AMYLASE in the last 168 hours. No results for input(s): AMMONIA in the last 168 hours. Coagulation Profile: No results for input(s): INR, PROTIME in the last 168 hours. Cardiac Enzymes: No results for input(s): CKTOTAL, CKMB, CKMBINDEX, TROPONINI in the last 168 hours. BNP (last 3 results) No results for input(s): PROBNP in the last 8760 hours. HbA1C: No results for input(s): HGBA1C in the last 72 hours. CBG: No results for input(s): GLUCAP in the last 168 hours. Lipid Profile: No results for input(s): CHOL, HDL, LDLCALC, TRIG, CHOLHDL, LDLDIRECT in the last 72 hours. Thyroid Function Tests: No results for input(s): TSH, T4TOTAL, FREET4, T3FREE, THYROIDAB in the last 72 hours. Anemia Panel: No results for input(s): VITAMINB12, FOLATE, FERRITIN, TIBC, IRON, RETICCTPCT in the last 72 hours. Sepsis Labs: No results for input(s): PROCALCITON, LATICACIDVEN in the last 168 hours.  Recent Results (from the past 240 hour(s))  Resp Panel by RT-PCR (Flu A&B, Covid) Nasopharyngeal Swab     Status: None   Collection Time: 05/21/21  8:07 PM   Specimen: Nasopharyngeal Swab;  Nasopharyngeal(NP) swabs in vial transport medium  Result Value Ref Range Status   SARS Coronavirus 2 by RT PCR NEGATIVE NEGATIVE Final    Comment: (NOTE) SARS-CoV-2 target nucleic acids are NOT DETECTED.  The SARS-CoV-2 RNA is generally detectable in upper respiratory specimens during the acute phase of infection. The lowest concentration of SARS-CoV-2 viral copies this assay can detect is 138 copies/mL. A negative result does not preclude SARS-Cov-2 infection and should not be used as the sole basis for treatment or other patient management decisions. A negative result may occur with  improper specimen collection/handling, submission of specimen other than nasopharyngeal swab, presence of viral mutation(s) within the areas targeted by this assay, and inadequate number of viral copies(<138 copies/mL). A negative result must be combined with clinical observations, patient history, and epidemiological information. The expected result is Negative.  Fact Sheet for Patients:  EntrepreneurPulse.com.au  Fact Sheet for Healthcare Providers:  IncredibleEmployment.be  This  test is no t yet approved or cleared by the [place] and  has been authorized for detection and/or diagnosis of SARS-CoV-2 by FDA under an Emergency Use Authorization (EUA). This EUA will remain  in effect (meaning this test can be used) for the duration of the COVID-19 declaration under Section 564(b)(1) of the Act, 21 U.S.C.section 360bbb-3(b)(1), unless the authorization is terminated  or revoked sooner.       Influenza A by PCR NEGATIVE NEGATIVE Final   Influenza B by PCR NEGATIVE NEGATIVE Final    Comment: (NOTE) The Xpert Xpress SARS-CoV-2/FLU/RSV plus assay is intended as an aid in the diagnosis of influenza from Nasopharyngeal swab specimens and should not be used as a sole basis for treatment. Nasal washings and aspirates are unacceptable for Xpert Xpress  SARS-CoV-2/FLU/RSV testing.  Fact Sheet for Patients: EntrepreneurPulse.com.au  Fact Sheet for Healthcare Providers: IncredibleEmployment.be  This test is not yet approved or cleared by the [place] FDA and has been authorized for detection and/or diagnosis of SARS-CoV-2 by FDA under an Emergency Use Authorization (EUA). This EUA will remain in effect (meaning this test can be used) for the duration of the COVID-19 declaration under Section 564(b)(1) of the Act, 21 U.S.C. section 360bbb-3(b)(1), unless the authorization is terminated or revoked.  Performed at [facility], [address] [place], [place] [address]   Surgical pcr screen     Status: None   Collection Time: 05/22/21  3:44 PM   Specimen: Nasal Mucosa; Nasal Swab  Result Value Ref Range Status   MRSA, PCR NEGATIVE NEGATIVE Final   Staphylococcus aureus NEGATIVE NEGATIVE Final    Comment: (NOTE) The Xpert SA Assay (FDA approved for NASAL specimens in patients 82 years of age and older), is one component of a comprehensive surveillance program. It is not intended to diagnose infection nor to guide or monitor treatment. Performed at [facility], [place] [address], [place], [place] [address]           Radiology Studies: Pelvis Portable  Result Date: 05/23/2021 CLINICAL DATA:  Status post ORIF of left hip fracture EXAM: PORTABLE PELVIS 1-2 VIEWS COMPARISON:  Intraoperative films from earlier in the same day. FINDINGS: Three compression screws are noted traversing the femoral neck on the left into the femoral head. The overall appearance is similar to that seen on the intraoperative films. No acute bony abnormality is noted. IMPRESSION: Status post ORIF of left femoral neck fracture. Electronically Signed   By: Inez Catalina M.D.   On: 05/23/2021 19:46   CT HIP LEFT WO CONTRAST  Result Date: 05/22/2021 CLINICAL DATA:  Hip trauma,  fracture suspected. Patient fell 3 weeks ago and is unable to bear weight. EXAM: CT OF THE LEFT HIP WITHOUT CONTRAST TECHNIQUE: Multidetector CT imaging of the left hip was performed according to the standard protocol. Multiplanar CT image reconstructions were also generated. COMPARISON:  Radiographs 05/21/2021 and pelvic CT 11/24/2020. FINDINGS: Bones/Joint/Cartilage The bones appear adequately mineralized. The femoral head is located. There is a minimally displaced subcapital fracture of the left femoral neck with minimal valgus angulation. This fracture demonstrates no intertrochanteric extension. There are underlying moderate left hip degenerative changes with a small joint effusion. The visualized left hemipelvis is intact. Ligaments Suboptimally assessed by CT. Muscles and Tendons Mild generalized muscular atrophy without focal intramuscular hematoma. Soft tissues Iliofemoral atherosclerosis. No significant periarticular hematoma. Prominent stool in the rectum noted. IMPRESSION: CT confirms the presence of a mildly displaced and angulated subcapital  fracture of the left femoral neck. Underlying moderate left hip degenerative changes. Electronically Signed   By: Richardean Sale M.D.   On: 05/22/2021 14:50   DG C-Arm 1-60 Min-No Report  Result Date: 05/23/2021 Fluoroscopy was utilized by the requesting physician.  No radiographic interpretation.   DG HIP OPERATIVE UNILAT W OR W/O PELVIS LEFT  Result Date: 05/23/2021 CLINICAL DATA:  Left hip surgery EXAM: OPERATIVE left HIP (WITH PELVIS IF PERFORMED) 2 VIEWS TECHNIQUE: Fluoroscopic spot image(s) were submitted for interpretation post-operatively. COMPARISON:  05/21/2021 FINDINGS: Two low resolution intraoperative spot views of the left hip. Total fluoroscopy time was 46 seconds. The images demonstrate 3 threaded screw fixation of the left femur for femoral neck fracture. IMPRESSION: Intraoperative fluoroscopic assistance provided during left hip surgery  Electronically Signed   By: Donavan Foil M.D.   On: 05/23/2021 17:00        Scheduled Meds:  aspirin EC  325 mg Oral Q breakfast   cholestyramine light  4 g Oral BID   docusate sodium  100 mg Oral BID   feeding supplement  237 mL Oral TID BM   iron polysaccharides  150 mg Oral Q breakfast   levothyroxine  50 mcg Oral QAC breakfast   melatonin  5 mg Oral QHS   multivitamin with minerals  1 tablet Oral Daily   pantoprazole  40 mg Oral Daily   saccharomyces boulardii  250 mg Oral BID   senna  1 tablet Oral BID   tamsulosin  0.4 mg Oral Daily   Continuous Infusions:     LOS: 2 days   Time spent: 88min  Dot Splinter C Winter Trefz, DO [facility]  If 7PM-7AM, please contact night-coverage [email or web address]  05/24/2021, 7:39 AM

## 2021-05-24 NOTE — Plan of Care (Signed)
  Problem: Pain Management: Goal: Pain level will decrease Outcome: Progressing   Problem: Nutrition: Goal: Adequate nutrition will be maintained Outcome: Progressing   Problem: Safety: Goal: Ability to remain free from injury will improve Outcome: Progressing

## 2021-05-24 NOTE — Plan of Care (Signed)
  Problem: Pain Management: Goal: Pain level will decrease Outcome: Progressing   Problem: Education: Goal: Knowledge of General Education information will improve Description: Including pain rating scale, medication(s)/side effects and non-pharmacologic comfort measures Outcome: Progressing   Problem: Clinical Measurements: Goal: Ability to maintain clinical measurements within normal limits will improve Outcome: Progressing

## 2021-05-24 NOTE — Evaluation (Signed)
Occupational Therapy Evaluation Patient Details Name: [name] MRN: [id number] DOB: 11-Jan-1925 Today's Date: 05/24/2021   History of Present Illness Patient is a 87 year old male that had a fall ~3 weeks ago and has been unable to ambulate at nursing facility. Pt + for L hip fx, s/p ORIF. PMH includes previous falls, BPH, spermatocele, bladder diverticuli, rhabdomyolysis, history of AKI, normocytic anemia, osteoarthritis, cerebral atrophy, history of cerebrovascular disease, scoliosis of C-spine and lumbar spine, lumbar DJD, GERD, history of Mallory-Weiss tear, hypercalcemia, osteoporosis, hyperlipidemia, hypertension, history of herpes zoster   Clinical Impression   Patient is from [facility] and per chart review prior to recent fall was ambulatory with walker and staff. Since fall ~3 weeks ago patient has primarily been bed bound. Unsure of assist needed for self care tasks, patient stating he could dress and toilet himself however has history of cognitive impairments and is not oriented to time or his own birthday. Patient did well with bed mobility needing min G assist to sit upright and min A to scoot hips to edge. Needing multiple attempts to stand upright with max A x2, bed height elevated and bilateral blocking of feet due to legs sliding out even with patient shoes on. Patient max A x2 for stand pivot to [facility] with rolling walker, needing third assist from CNA to perform perianal care in standing while PT/OT block patient's feet and provide max A x2 to maintain static standing. Patient needing light min A to lift legs back onto bed at end of session. Acute OT to follow to maximize patient balance and activity tolerance in order to reduce caregiver burden and facilitate D/C back to [facility].       Recommendations for follow up therapy are one component of a multi-disciplinary discharge planning process, led by the attending physician.  Recommendations may be updated based on patient status,  additional functional criteria and insurance authorization.   Follow Up Recommendations  SNF;Other (comment) (return to [facility])    Equipment Recommendations  None recommended by OT       Precautions / Restrictions Precautions Precautions: Fall Precaution Comments: bilateral feet sliding forward Restrictions LLE Weight Bearing: Weight bearing as tolerated      Mobility Bed Mobility Overal bed mobility: Needs Assistance Bed Mobility: Supine to Sit;Sit to Supine     Supine to sit: Min guard;HOB elevated Sit to supine: Min assist   General bed mobility comments: Patient mostly self able to sit upright to edge of bed needing assist to scoot hips out to edge of bed. Light min A to lift legs back onto bed at end of session.    Transfers Overall transfer level: Needs assistance Equipment used: Rolling walker (2 wheeled) Transfers: Sit to/from [facility] Sit to Stand: Max assist;+2 physical assistance;+2 safety/equipment;From elevated surface Stand pivot transfers: Max assist;+2 physical assistance;+2 safety/equipment       General transfer comment: Patient with strong posterior lean and difficulty extending at hips despite multiple cues to tuck buttock. Patient also having difficulty keeping feet planted with bilateral legs sliding forward needing to be blocked throughout transfer to/from bedside commode by PT/OT    Balance Overall balance assessment: History of Falls;Needs assistance Sitting-balance support: Feet supported Sitting balance-Leahy Scale: Fair   Postural control: Posterior lean Standing balance support: Bilateral upper extremity supported Standing balance-Leahy Scale: Zero Standing balance comment: max A x2 with rolling walker  ADL either performed or assessed with clinical judgement   ADL Overall ADL's : Needs assistance/impaired Eating/Feeding: Set up;Sitting Eating/Feeding Details (indicate cue  type and reason): to drink from cup Grooming: Set up;Sitting;Bed level   Upper Body Bathing: Minimal assistance;Sitting;Bed level   Lower Body Bathing: Maximal assistance;Sitting/lateral leans;Bed level   Upper Body Dressing : Minimal assistance;Sitting;Bed level   Lower Body Dressing: Total assistance;Sitting/lateral leans Lower Body Dressing Details (indicate cue type and reason): to don shoes Toilet Transfer: Maximal assistance;+2 for physical assistance;+2 for safety/equipment;Stand-pivot;Cueing for sequencing;Cueing for safety;BSC;RW Toilet Transfer Details (indicate cue type and reason): Strong posterior lean. Difficulty tucking buttock and extending at hips for more upright posture. Feet significantly sliding needing to be blocked by PT/OT. Toileting- Clothing Manipulation and Hygiene: Total assistance;Sit to/from stand Toileting - Clothing Manipulation Details (indicate cue type and reason): Needing +3 assist, max A x2 to stand and CNA present to perform perianal care after small bowel movement     Functional mobility during ADLs: Maximal assistance;+2 for physical assistance;+2 for safety/equipment;Cueing for safety;Cueing for sequencing;Rolling walker General ADL Comments: Unsure of patient's baseline with self care tasks, per chart review patient was ambulatory prior to fall ~3 weeks ago      Pertinent Vitals/Pain Pain Assessment: No/denies pain     Hand Dominance Right   Extremity/Trunk Assessment Upper Extremity Assessment Upper Extremity Assessment: Overall WFL for tasks assessed   Lower Extremity Assessment Lower Extremity Assessment: Defer to PT evaluation   Cervical / Trunk Assessment Cervical / Trunk Assessment: Kyphotic   Communication     Cognition Arousal/Alertness: Awake/alert Behavior During Therapy: WFL for tasks assessed/performed Overall Cognitive Status: History of cognitive impairments - at baseline                                  General Comments: patient with hx of dementia, is aware had hip surgery              Home Living Family/patient expects to be discharged to:: Skilled nursing facility                                        Prior Functioning/Environment          Comments: Unsure as patient is unreliable historian. Per chart review patient was ambulatory with staff using rolling walker prior to fall ~3 weeks ago. Since fall as primarily been bed bound.        OT Problem List: Decreased activity tolerance;Impaired balance (sitting and/or standing);Decreased safety awareness      OT Treatment/Interventions: Self-care/ADL training;Therapeutic activities;Patient/family education;Balance training    OT Goals(Current goals can be found in the care plan section) Acute Rehab OT Goals Patient Stated Goal: use toilet OT Goal Formulation: With patient Time For Goal Achievement: 06/07/21 Potential to Achieve Goals: Fair  OT Frequency: Min 2X/week    AM-PAC OT "6 Clicks" Daily Activity     Outcome Measure Help from another person eating meals?: A Little Help from another person taking care of personal grooming?: A Little Help from another person toileting, which includes using toliet, bedpan, or urinal?: Total Help from another person bathing (including washing, rinsing, drying)?: A Lot Help from another person to put on and taking off regular upper body clothing?: A Little Help from another person to put on and taking off regular lower body  clothing?: Total 6 Click Score: 13   End of Session Equipment Utilized During Treatment: Gait belt;Rolling walker Nurse Communication: Mobility status  Activity Tolerance: Patient tolerated treatment well Patient left: in bed;with call bell/phone within reach;with bed alarm set  OT Visit Diagnosis: Other abnormalities of gait and mobility (R26.89);Unsteadiness on feet (R26.81);History of falling (Z91.81)                Time: 8421-0312 OT  Time Calculation (min): 28 min Charges:  OT General Charges $OT Visit: 1 Visit OT Evaluation $OT Eval Moderate Complexity: [place] OT OT pager: [place] 05/24/2021, 1:45 PM

## 2021-05-24 NOTE — Evaluation (Signed)
Physical Therapy Evaluation Patient Details Name: [name] MRN: [id number] DOB: March 05, 1925 Today's Date: 05/24/2021  History of Present Illness  Patient is a 87 year old male that had a fall ~3 weeks ago and has been unable to ambulate at nursing facility. Pt + for L hip fx, s/p ORIF. PMH includes previous falls, BPH, spermatocele, bladder diverticuli, rhabdomyolysis, history of AKI, normocytic anemia, osteoarthritis, cerebral atrophy, history of cerebrovascular disease, scoliosis of C-spine and lumbar spine, lumbar DJD, GERD, history of Mallory-Weiss tear, hypercalcemia, osteoporosis, hyperlipidemia, hypertension, history of herpes zoster  Clinical Impression  Patient is from [facility] and per chart review prior to recent fall was ambulatory with walker and staff. Since fall ~3 weeks ago patient has primarily been bed bound. Patient did well with bed mobility needing min G assist to sit upright and min A to scoot hips to edge. Needing multiple attempts to stand upright with max A x2, bed height elevated and bilateral blocking of feet due to legs sliding out even with patient shoes on. Patient max A x2 for stand pivot to [facility] with rolling walker, needing third assist from CNA to perform perianal care in standing while PT/OT block patient's feet and provide max A x2 to maintain static standing. Patient needing light min A to lift legs back onto bed at end of session. Acute PT to follow to maximize patient IND and saftey in order to reduce caregiver burden and facilitate D/C back to [facility]     Recommendations for follow up therapy are one component of a multi-disciplinary discharge planning process, led by the attending physician.  Recommendations may be updated based on patient status, additional functional criteria and insurance authorization.  Follow Up Recommendations SNF    Equipment Recommendations  None recommended by PT    Recommendations for Other Services       Precautions /  Restrictions Precautions Precautions: Fall Precaution Comments: bilateral feet sliding forward Restrictions Weight Bearing Restrictions: No LLE Weight Bearing: Weight bearing as tolerated      Mobility  Bed Mobility Overal bed mobility: Needs Assistance Bed Mobility: Supine to Sit;Sit to Supine     Supine to sit: Min guard;HOB elevated Sit to supine: Min assist   General bed mobility comments: Patient mostly self able to sit upright to edge of bed needing assist to scoot hips out to edge of bed. Light min A to lift legs back onto bed at end of session.    Transfers Overall transfer level: Needs assistance Equipment used: Rolling walker (2 wheeled) Transfers: Sit to/from [facility] Sit to Stand: Max assist;+2 physical assistance;+2 safety/equipment;From elevated surface Stand pivot transfers: Max assist;+2 physical assistance;+2 safety/equipment       General transfer comment: Patient with strong posterior lean and difficulty extending at hips despite multiple cues to tuck buttock. Patient also having difficulty keeping feet planted with bilateral legs sliding forward needing to be blocked throughout transfer to/from bedside commode by PT/OT  Ambulation/Gait             General Gait Details: Unsafe to attempt gait - see difficulty with transfers  Stairs            Wheelchair Mobility    Modified Rankin (Stroke Patients Only)       Balance Overall balance assessment: History of Falls;Needs assistance Sitting-balance support: Feet supported Sitting balance-Leahy Scale: Fair   Postural control: Posterior lean Standing balance support: Bilateral upper extremity supported Standing balance-Leahy Scale: Zero Standing balance comment: max A x2 with  rolling walker                             Pertinent Vitals/Pain Pain Assessment: No/denies pain    Home Living Family/patient expects to be discharged to:: Skilled nursing  facility                      Prior Function Level of Independence: Needs assistance         Comments: Unsure as patient is unreliable historian. Per chart review patient was ambulatory with staff using rolling walker prior to fall ~3 weeks ago. Since fall has primarily been bed bound.     Hand Dominance   Dominant Hand: Right    Extremity/Trunk Assessment   Upper Extremity Assessment Upper Extremity Assessment: Overall WFL for tasks assessed    Lower Extremity Assessment Lower Extremity Assessment: LLE deficits/detail    Cervical / Trunk Assessment Cervical / Trunk Assessment: Kyphotic  Communication   Communication: No difficulties  Cognition Arousal/Alertness: Awake/alert Behavior During Therapy: WFL for tasks assessed/performed Overall Cognitive Status: History of cognitive impairments - at baseline                                 General Comments: patient with hx of dementia, is aware had hip surgery      General Comments      Exercises     Assessment/Plan    PT Assessment Patient needs continued PT services  PT Problem List Decreased strength;Decreased range of motion;Decreased activity tolerance;Decreased balance;Decreased mobility;Decreased knowledge of use of DME;Decreased cognition;Decreased safety awareness       PT Treatment Interventions DME instruction;Gait training;Functional mobility training;Therapeutic activities;Therapeutic exercise;Balance training;Patient/family education    PT Goals (Current goals can be found in the Care Plan section)  Acute Rehab PT Goals Patient Stated Goal: use toilet PT Goal Formulation: With patient Time For Goal Achievement: 06/07/21 Potential to Achieve Goals: Fair    Frequency Min 3X/week   Barriers to discharge        Co-evaluation PT/OT/SLP Co-Evaluation/Treatment: Yes Reason for Co-Treatment: For patient/therapist safety PT goals addressed during session: Mobility/safety with  mobility OT goals addressed during session: ADL's and self-care       AM-PAC PT "6 Clicks" Mobility  Outcome Measure Help needed turning from your back to your side while in a flat bed without using bedrails?: A Little Help needed moving from lying on your back to sitting on the side of a flat bed without using bedrails?: A Little Help needed moving to and from a bed to a chair (including a wheelchair)?: A Lot Help needed standing up from a chair using your arms (e.g., wheelchair or bedside chair)?: A Lot Help needed to walk in hospital room?: Total Help needed climbing 3-5 steps with a railing? : Total 6 Click Score: 12    End of Session Equipment Utilized During Treatment: Gait belt Activity Tolerance: Patient tolerated treatment well Patient left: in bed;with call bell/phone within reach;with bed alarm set Nurse Communication: Mobility status PT Visit Diagnosis: Unsteadiness on feet (R26.81);History of falling (Z91.81);Difficulty in walking, not elsewhere classified (R26.2)    Time: 1610-9604 PT Time Calculation (min) (ACUTE ONLY): 26 min   Charges:   PT Evaluation $PT Eval Moderate Complexity: [place] Pager [phone] Office [phone]  Shallon Yaklin 05/24/2021, 2:05 PM

## 2021-05-25 DIAGNOSIS — S72002A Fracture of unspecified part of neck of left femur, initial encounter for closed fracture: Secondary | ICD-10-CM | POA: Diagnosis not present

## 2021-05-25 LAB — CBC
HCT: 22.9 % — ABNORMAL LOW (ref 39.0–52.0)
Hemoglobin: 7.3 g/dL — ABNORMAL LOW (ref 13.0–17.0)
MCH: 25.5 pg — ABNORMAL LOW (ref 26.0–34.0)
MCHC: 31.9 g/dL (ref 30.0–36.0)
MCV: 80.1 fL (ref 80.0–100.0)
Platelets: 442 10*3/uL — ABNORMAL HIGH (ref 150–400)
RBC: 2.86 MIL/uL — ABNORMAL LOW (ref 4.22–5.81)
RDW: 19 % — ABNORMAL HIGH (ref 11.5–15.5)
WBC: 11 10*3/uL — ABNORMAL HIGH (ref 4.0–10.5)
nRBC: 0 % (ref 0.0–0.2)

## 2021-05-25 LAB — BASIC METABOLIC PANEL
Anion gap: 8 (ref 5–15)
BUN: 22 mg/dL (ref 8–23)
CO2: 26 mmol/L (ref 22–32)
Calcium: 8.4 mg/dL — ABNORMAL LOW (ref 8.9–10.3)
Chloride: 103 mmol/L (ref 98–111)
Creatinine, Ser: 1.14 mg/dL (ref 0.61–1.24)
GFR, Estimated: 60 mL/min — ABNORMAL LOW (ref >60)
Glucose, Bld: 101 mg/dL — ABNORMAL HIGH (ref 70–99)
Potassium: 4 mmol/L (ref 3.5–5.1)
Sodium: 137 mmol/L (ref 135–145)

## 2021-05-25 LAB — GLUCOSE, CAPILLARY: Glucose-Capillary: 79 mg/dL (ref 70–99)

## 2021-05-25 NOTE — Plan of Care (Signed)
  Problem: Pain Management: Goal: Pain level will decrease Outcome: Progressing   Problem: Clinical Measurements: Goal: Ability to maintain clinical measurements within normal limits will improve Outcome: Progressing   Problem: Clinical Measurements: Goal: Will remain free from infection Outcome: Progressing   Problem: Activity: Goal: Risk for activity intolerance will decrease Outcome: Progressing

## 2021-05-25 NOTE — Progress Notes (Signed)
PROGRESS NOTE    [name]  IPJ:[id number] DOB: October 17, 1924 DOA: 05/21/2021 PCP: [name], MD   Brief Narrative:  [name] is a 87 y.o. male with medical history significant of BPH, spermatocele, bladder diverticuli, rhabdomyolysis, history of AKI, normocytic anemia, osteoarthritis, cerebral atrophy, history of cerebrovascular disease, scoliosis of C-spine and lumbar spine, lumbar DJD, GERD, history of Mallory-Weiss tear, hypercalcemia, osteoporosis, hyperlipidemia, hypertension, history of herpes zoster with chronic indwelling Foley and PEG tube who presents to the ED with worsening hip pain and ambulatory dysfunction noted to have left subcapital femoral fracture on imaging with fall most recently 3 weeks ago concerning for subacute injury.  Orthopedics consulted at intake for further evaluation and treatment TRH consulted for admission.   Assessment & Plan:    Closed left hip fracture ([place]) -Orthopedics following, status post ORIF 05/23/21 w/ Ortho ([place]) -weightbearing as tolerated, aspirin twice daily for DVT prophylaxis, outpatient follow-up in 2 weeks -Pain currently well controlled on current regimen, continue as is  -PT OT to follow afterwards, likely discharge back to current nursing facility for ongoing PT   PEG tube placement, chronic Patient extremely poor historian given baseline dementia and advanced age  General surgery consulted at intake due to concern by staff that PEG tube is "loose"  -appears to be intact at bedside today  Continue to advance p.o. diet as tolerated  GERD (gastroesophageal reflux disease) Continue pantoprazole 40 mg p.o. daily.   Protein-calorie malnutrition, moderate (HCC) Protein supplementation. Consult nutritional services.   CKD (chronic kidney disease), stage III (HCC) History of BPH. Has permanent Foley. Monitor electrolytes and GFR.   Hypothyroidism Continue levothyroxine 50 mcg p.o. daily.   Cognitive  impairment Supportive care.   IDA (iron deficiency anemia) Monitor H&H. Transfuse as needed.   DVT prophylaxis:      SCDs. Code Status:              DNR. Family Communication:   Daughter at bedside  Status is: Inpatient  Dispo: The patient is from: SNF              Anticipated d/c is to: SNF              Anticipated d/c date is: 42 to 72 hours pending clinical course              Patient currently not medically stable for discharge  Consultants:  Orthopedic surgery  Procedures:  ORIF, left hip 05/23/2021  Antimicrobials:  None  Subjective: No acute issues or events overnight, resting comfortably in bed denies nausea vomiting diarrhea constipation headache fevers or chills.  Pain well controlled at this time.  Objective: Vitals:   05/24/21 1020 05/24/21 1445 05/24/21 2135 05/25/21 0515  BP: (!) 145/72 (!) 150/93 (!) 142/76 131/77  Pulse: 62 60 71 (!) 55  Resp:   15 17  Temp: 97.9 F (36.6 C) 97.9 F (36.6 C) 97.8 F (36.6 C) 97.8 F (36.6 C)  TempSrc: Oral Oral Oral   SpO2: 97% 96% 97% 97%  Weight:      Height:        Intake/Output Summary (Last 24 hours) at 05/25/2021 0800 Last data filed at 05/25/2021 0515 Gross per 24 hour  Intake 300 ml  Output 950 ml  Net -650 ml    Filed Weights   05/22/21 0032  Weight: 60.5 kg    Examination:  General:  Pleasantly resting in bed, No acute distress. HEENT:  Normocephalic atraumatic.  Sclerae nonicteric, noninjected.  Extraocular movements intact bilaterally. Neck:  Without mass or deformity.  Trachea is midline. Lungs:  Clear to auscultate bilaterally without rhonchi, wheeze, or rales. Heart:  Regular rate and rhythm.  Without murmurs, rubs, or gallops. Abdomen:  Soft, nontender, nondistended.  Without guarding or rebound. PEG tube clean dry intact, foley catheter intact Extremities: Without cyanosis, clubbing, edema, or obvious deformity.  Bandage clean dry intact Vascular:  Dorsalis pedis and posterior tibial  pulses palpable bilaterally. Skin:  Warm and dry, no erythema, no ulcerations.  Data Reviewed: I have personally reviewed following labs and imaging studies  CBC: Recent Labs  Lab 05/21/21 2007 05/22/21 0538 05/23/21 0328 05/24/21 0331 05/25/21 0324  WBC 11.2* 8.9 8.6 7.9 11.0*  NEUTROABS 7.4  --   --   --   --   HGB 9.5* 8.2* 8.0* 8.0* 7.3*  HCT 31.0* 25.5* 25.6* 26.1* 22.9*  MCV 80.3 81.0 80.0 80.6 80.1  PLT 533* 429* 452* 497* 442*    Basic Metabolic Panel: Recent Labs  Lab 05/21/21 2022 05/22/21 0538 05/23/21 0328 05/24/21 0331 05/25/21 0324  NA 134* 133* 133* 134* 137  K 3.6 3.6 3.8 3.9 4.0  CL 99 99 102 103 103  CO2 25 25 25 25 26   GLUCOSE 101* 94 95 163* 101*  BUN 18 16 18 19 22   CREATININE 1.12 1.17 1.16 1.28* 1.14  CALCIUM 8.5* 7.9* 8.1* 8.4* 8.4*    GFR: Estimated Creatinine Clearance: 33.9 mL/min (by C-G formula based on SCr of 1.14 mg/dL). Liver Function Tests: Recent Labs  Lab 05/22/21 0538 05/23/21 0328  AST 15 13*  ALT 9 9  ALKPHOS 70 74  BILITOT 0.5 0.6  PROT 6.3* 6.3*  ALBUMIN 2.6* 2.6*    No results for input(s): LIPASE, AMYLASE in the last 168 hours. No results for input(s): AMMONIA in the last 168 hours. Coagulation Profile: No results for input(s): INR, PROTIME in the last 168 hours. Cardiac Enzymes: No results for input(s): CKTOTAL, CKMB, CKMBINDEX, TROPONINI in the last 168 hours. BNP (last 3 results) No results for input(s): PROBNP in the last 8760 hours. HbA1C: No results for input(s): HGBA1C in the last 72 hours. CBG: No results for input(s): GLUCAP in the last 168 hours. Lipid Profile: No results for input(s): CHOL, HDL, LDLCALC, TRIG, CHOLHDL, LDLDIRECT in the last 72 hours. Thyroid Function Tests: No results for input(s): TSH, T4TOTAL, FREET4, T3FREE, THYROIDAB in the last 72 hours. Anemia Panel: No results for input(s): VITAMINB12, FOLATE, FERRITIN, TIBC, IRON, RETICCTPCT in the last 72 hours. Sepsis Labs: No  results for input(s): PROCALCITON, LATICACIDVEN in the last 168 hours.  Recent Results (from the past 240 hour(s))  Resp Panel by RT-PCR (Flu A&B, Covid) Nasopharyngeal Swab     Status: None   Collection Time: 05/21/21  8:07 PM   Specimen: Nasopharyngeal Swab; Nasopharyngeal(NP) swabs in vial transport medium  Result Value Ref Range Status   SARS Coronavirus 2 by RT PCR NEGATIVE NEGATIVE Final    Comment: (NOTE) SARS-CoV-2 target nucleic acids are NOT DETECTED.  The SARS-CoV-2 RNA is generally detectable in upper respiratory specimens during the acute phase of infection. The lowest concentration of SARS-CoV-2 viral copies this assay can detect is 138 copies/mL. A negative result does not preclude SARS-Cov-2 infection and should not be used as the sole basis for treatment or other patient management decisions. A negative result may occur with  improper specimen collection/handling, submission of specimen other than nasopharyngeal swab, presence of viral mutation(s) within the areas targeted  by this assay, and inadequate number of viral copies(<138 copies/mL). A negative result must be combined with clinical observations, patient history, and epidemiological information. The expected result is Negative.  Fact Sheet for Patients:  EntrepreneurPulse.com.au  Fact Sheet for Healthcare Providers:  IncredibleEmployment.be  This test is no t yet approved or cleared by the [place] FDA and  has been authorized for detection and/or diagnosis of SARS-CoV-2 by FDA under an Emergency Use Authorization (EUA). This EUA will remain  in effect (meaning this test can be used) for the duration of the COVID-19 declaration under Section 564(b)(1) of the Act, 21 U.S.C.section 360bbb-3(b)(1), unless the authorization is terminated  or revoked sooner.       Influenza A by PCR NEGATIVE NEGATIVE Final   Influenza B by PCR NEGATIVE NEGATIVE Final    Comment:  (NOTE) The Xpert Xpress SARS-CoV-2/FLU/RSV plus assay is intended as an aid in the diagnosis of influenza from Nasopharyngeal swab specimens and should not be used as a sole basis for treatment. Nasal washings and aspirates are unacceptable for Xpert Xpress SARS-CoV-2/FLU/RSV testing.  Fact Sheet for Patients: EntrepreneurPulse.com.au  Fact Sheet for Healthcare Providers: IncredibleEmployment.be  This test is not yet approved or cleared by the [place] FDA and has been authorized for detection and/or diagnosis of SARS-CoV-2 by FDA under an Emergency Use Authorization (EUA). This EUA will remain in effect (meaning this test can be used) for the duration of the COVID-19 declaration under Section 564(b)(1) of the Act, 21 U.S.C. section 360bbb-3(b)(1), unless the authorization is terminated or revoked.  Performed at [facility], [address] [place], [place] [address]   Surgical pcr screen     Status: None   Collection Time: 05/22/21  3:44 PM   Specimen: Nasal Mucosa; Nasal Swab  Result Value Ref Range Status   MRSA, PCR NEGATIVE NEGATIVE Final   Staphylococcus aureus NEGATIVE NEGATIVE Final    Comment: (NOTE) The Xpert SA Assay (FDA approved for NASAL specimens in patients 60 years of age and older), is one component of a comprehensive surveillance program. It is not intended to diagnose infection nor to guide or monitor treatment. Performed at [facility], [place] [address] [place], [place] [address]           Radiology Studies: Pelvis Portable  Result Date: 05/23/2021 CLINICAL DATA:  Status post ORIF of left hip fracture EXAM: PORTABLE PELVIS 1-2 VIEWS COMPARISON:  Intraoperative films from earlier in the same day. FINDINGS: Three compression screws are noted traversing the femoral neck on the left into the femoral head. The overall appearance is similar to that seen on the intraoperative  films. No acute bony abnormality is noted. IMPRESSION: Status post ORIF of left femoral neck fracture. Electronically Signed   By: Inez Catalina M.D.   On: 05/23/2021 19:46   DG C-Arm 1-60 Min-No Report  Result Date: 05/23/2021 Fluoroscopy was utilized by the requesting physician.  No radiographic interpretation.   DG HIP OPERATIVE UNILAT W OR W/O PELVIS LEFT  Result Date: 05/23/2021 CLINICAL DATA:  Left hip surgery EXAM: OPERATIVE left HIP (WITH PELVIS IF PERFORMED) 2 VIEWS TECHNIQUE: Fluoroscopic spot image(s) were submitted for interpretation post-operatively. COMPARISON:  05/21/2021 FINDINGS: Two low resolution intraoperative spot views of the left hip. Total fluoroscopy time was 46 seconds. The images demonstrate 3 threaded screw fixation of the left femur for femoral neck fracture. IMPRESSION: Intraoperative fluoroscopic assistance provided during left hip surgery Electronically Signed   By: Madie Reno.D.  On: 05/23/2021 17:00        Scheduled Meds:  aspirin EC  325 mg Oral Q breakfast   Chlorhexidine Gluconate Cloth  6 each Topical Daily   cholestyramine light  4 g Oral BID   docusate sodium  100 mg Oral BID   feeding supplement  237 mL Oral TID BM   iron polysaccharides  150 mg Oral Q breakfast   levothyroxine  50 mcg Oral QAC breakfast   melatonin  5 mg Oral QHS   multivitamin with minerals  1 tablet Oral Daily   pantoprazole  40 mg Oral Daily   saccharomyces boulardii  250 mg Oral BID   senna  1 tablet Oral BID   tamsulosin  0.4 mg Oral Daily   Continuous Infusions:     LOS: 3 days   Time spent: 54min  Dandrea Medders C Xavious Sharrar, DO [facility]  If 7PM-7AM, please contact night-coverage [email or web address]  05/25/2021, 8:00 AM

## 2021-05-26 DIAGNOSIS — S72002A Fracture of unspecified part of neck of left femur, initial encounter for closed fracture: Secondary | ICD-10-CM | POA: Diagnosis not present

## 2021-05-26 LAB — CBC
HCT: 25.1 % — ABNORMAL LOW (ref 39.0–52.0)
Hemoglobin: 7.6 g/dL — ABNORMAL LOW (ref 13.0–17.0)
MCH: 24.3 pg — ABNORMAL LOW (ref 26.0–34.0)
MCHC: 30.3 g/dL (ref 30.0–36.0)
MCV: 80.2 fL (ref 80.0–100.0)
Platelets: 511 10*3/uL — ABNORMAL HIGH (ref 150–400)
RBC: 3.13 MIL/uL — ABNORMAL LOW (ref 4.22–5.81)
RDW: 19.1 % — ABNORMAL HIGH (ref 11.5–15.5)
WBC: 12.1 10*3/uL — ABNORMAL HIGH (ref 4.0–10.5)
nRBC: 0 % (ref 0.0–0.2)

## 2021-05-26 NOTE — Progress Notes (Signed)
PROGRESS NOTE    [name]  TGY:[id number] DOB: February 16, 1925 DOA: 05/21/2021 PCP: [name], MD   Brief Narrative:  [name] is a 87 y.o. male with medical history significant of BPH, spermatocele, bladder diverticuli, rhabdomyolysis, history of AKI, normocytic anemia, osteoarthritis, cerebral atrophy, history of cerebrovascular disease, scoliosis of C-spine and lumbar spine, lumbar DJD, GERD, history of Mallory-Weiss tear, hypercalcemia, osteoporosis, hyperlipidemia, hypertension, history of herpes zoster with chronic indwelling Foley and PEG tube who presents to the ED with worsening hip pain and ambulatory dysfunction noted to have left subcapital femoral fracture on imaging with fall most recently 3 weeks ago concerning for subacute injury.  Orthopedics consulted at intake for further evaluation and treatment TRH consulted for admission.   Assessment & Plan:    Closed left hip fracture ([place]) -Orthopedics following, status post ORIF 05/23/21 w/ Ortho ([place]) -weightbearing as tolerated, aspirin twice daily for DVT prophylaxis, outpatient follow-up in 2 weeks -Pain currently well controlled on current regimen, continue as is  -PT OT to follow afterwards, likely discharge back to current nursing facility for ongoing PT   PEG tube placement, chronic Patient extremely poor historian given baseline dementia and advanced age  General surgery consulted at intake due to concern by staff that PEG tube is "loose"  -appears to be intact at bedside today  Continue to advance p.o. diet as tolerated  GERD (gastroesophageal reflux disease) Continue pantoprazole 40 mg p.o. daily.   Protein-calorie malnutrition, moderate (HCC) Protein supplementation. Consult nutritional services.   CKD (chronic kidney disease), stage III (HCC) History of BPH. Has permanent Foley. Monitor electrolytes and GFR.   Hypothyroidism Continue levothyroxine 50 mcg p.o. daily.   Cognitive  impairment Supportive care.   IDA (iron deficiency anemia) Monitor H&H. Transfuse as needed.   DVT prophylaxis:      SCDs. Code Status:              DNR. Family Communication:   Daughter at bedside  Status is: Inpatient  Dispo: The patient is from: SNF              Anticipated d/c is to: SNF              Anticipated d/c date is: 68 to 72 hours pending clinical course              Patient currently not medically stable for discharge  Consultants:  Orthopedic surgery  Procedures:  ORIF, left hip 05/23/2021  Antimicrobials:  None  Subjective: No acute issues or events overnight, resting comfortably in bed denies nausea vomiting diarrhea constipation headache fevers or chills.  Pain well controlled at this time.  Objective: Vitals:   05/25/21 1342 05/25/21 1745 05/25/21 2021 05/26/21 0524  BP: (!) 146/73 134/79 (!) 141/75 (!) 152/83  Pulse: 60 67 82 (!) 58  Resp: 20  17 20   Temp: 98.5 F (36.9 C) 97.7 F (36.5 C) 97.8 F (36.6 C) 97.9 F (36.6 C)  TempSrc:  Oral Oral Oral  SpO2: 96% 99% 96% 97%  Weight:      Height:        Intake/Output Summary (Last 24 hours) at 05/26/2021 0734 Last data filed at 05/26/2021 0534 Gross per 24 hour  Intake 480 ml  Output 1200 ml  Net -720 ml    Filed Weights   05/22/21 0032  Weight: 60.5 kg    Examination:  General:  Pleasantly resting in bed, No acute distress. HEENT:  Normocephalic atraumatic.  Sclerae nonicteric, noninjected.  Extraocular movements intact bilaterally. Neck:  Without mass or deformity.  Trachea is midline. Lungs:  Clear to auscultate bilaterally without rhonchi, wheeze, or rales. Heart:  Regular rate and rhythm.  Without murmurs, rubs, or gallops. Abdomen:  Soft, nontender, nondistended.  Without guarding or rebound. PEG tube clean dry intact, foley catheter intact Extremities: Without cyanosis, clubbing, edema, or obvious deformity.  Bandage clean dry intact Vascular:  Dorsalis pedis and posterior  tibial pulses palpable bilaterally. Skin:  Warm and dry, no erythema, no ulcerations.  Data Reviewed: I have personally reviewed following labs and imaging studies  CBC: Recent Labs  Lab 05/21/21 2007 05/22/21 0538 05/23/21 0328 05/24/21 0331 05/25/21 0324 05/26/21 0321  WBC 11.2* 8.9 8.6 7.9 11.0* 12.1*  NEUTROABS 7.4  --   --   --   --   --   HGB 9.5* 8.2* 8.0* 8.0* 7.3* 7.6*  HCT 31.0* 25.5* 25.6* 26.1* 22.9* 25.1*  MCV 80.3 81.0 80.0 80.6 80.1 80.2  PLT 533* 429* 452* 497* 442* 511*    Basic Metabolic Panel: Recent Labs  Lab 05/21/21 2022 05/22/21 0538 05/23/21 0328 05/24/21 0331 05/25/21 0324  NA 134* 133* 133* 134* 137  K 3.6 3.6 3.8 3.9 4.0  CL 99 99 102 103 103  CO2 25 25 25 25 26   GLUCOSE 101* 94 95 163* 101*  BUN 18 16 18 19 22   CREATININE 1.12 1.17 1.16 1.28* 1.14  CALCIUM 8.5* 7.9* 8.1* 8.4* 8.4*    GFR: Estimated Creatinine Clearance: 33.9 mL/min (by C-G formula based on SCr of 1.14 mg/dL). Liver Function Tests: Recent Labs  Lab 05/22/21 0538 05/23/21 0328  AST 15 13*  ALT 9 9  ALKPHOS 70 74  BILITOT 0.5 0.6  PROT 6.3* 6.3*  ALBUMIN 2.6* 2.6*    No results for input(s): LIPASE, AMYLASE in the last 168 hours. No results for input(s): AMMONIA in the last 168 hours. Coagulation Profile: No results for input(s): INR, PROTIME in the last 168 hours. Cardiac Enzymes: No results for input(s): CKTOTAL, CKMB, CKMBINDEX, TROPONINI in the last 168 hours. BNP (last 3 results) No results for input(s): PROBNP in the last 8760 hours. HbA1C: No results for input(s): HGBA1C in the last 72 hours. CBG: Recent Labs  Lab 05/25/21 0843  GLUCAP 79   Lipid Profile: No results for input(s): CHOL, HDL, LDLCALC, TRIG, CHOLHDL, LDLDIRECT in the last 72 hours. Thyroid Function Tests: No results for input(s): TSH, T4TOTAL, FREET4, T3FREE, THYROIDAB in the last 72 hours. Anemia Panel: No results for input(s): VITAMINB12, FOLATE, FERRITIN, TIBC, IRON,  RETICCTPCT in the last 72 hours. Sepsis Labs: No results for input(s): PROCALCITON, LATICACIDVEN in the last 168 hours.  Recent Results (from the past 240 hour(s))  Resp Panel by RT-PCR (Flu A&B, Covid) Nasopharyngeal Swab     Status: None   Collection Time: 05/21/21  8:07 PM   Specimen: Nasopharyngeal Swab; Nasopharyngeal(NP) swabs in vial transport medium  Result Value Ref Range Status   SARS Coronavirus 2 by RT PCR NEGATIVE NEGATIVE Final    Comment: (NOTE) SARS-CoV-2 target nucleic acids are NOT DETECTED.  The SARS-CoV-2 RNA is generally detectable in upper respiratory specimens during the acute phase of infection. The lowest concentration of SARS-CoV-2 viral copies this assay can detect is 138 copies/mL. A negative result does not preclude SARS-Cov-2 infection and should not be used as the sole basis for treatment or other patient management decisions. A negative result may occur with  improper specimen collection/handling, submission of specimen other  than nasopharyngeal swab, presence of viral mutation(s) within the areas targeted by this assay, and inadequate number of viral copies(<138 copies/mL). A negative result must be combined with clinical observations, patient history, and epidemiological information. The expected result is Negative.  Fact Sheet for Patients:  EntrepreneurPulse.com.au  Fact Sheet for Healthcare Providers:  IncredibleEmployment.be  This test is no t yet approved or cleared by the [place] FDA and  has been authorized for detection and/or diagnosis of SARS-CoV-2 by FDA under an Emergency Use Authorization (EUA). This EUA will remain  in effect (meaning this test can be used) for the duration of the COVID-19 declaration under Section 564(b)(1) of the Act, 21 U.S.C.section 360bbb-3(b)(1), unless the authorization is terminated  or revoked sooner.       Influenza A by PCR NEGATIVE NEGATIVE Final   Influenza  B by PCR NEGATIVE NEGATIVE Final    Comment: (NOTE) The Xpert Xpress SARS-CoV-2/FLU/RSV plus assay is intended as an aid in the diagnosis of influenza from Nasopharyngeal swab specimens and should not be used as a sole basis for treatment. Nasal washings and aspirates are unacceptable for Xpert Xpress SARS-CoV-2/FLU/RSV testing.  Fact Sheet for Patients: EntrepreneurPulse.com.au  Fact Sheet for Healthcare Providers: IncredibleEmployment.be  This test is not yet approved or cleared by the [place] FDA and has been authorized for detection and/or diagnosis of SARS-CoV-2 by FDA under an Emergency Use Authorization (EUA). This EUA will remain in effect (meaning this test can be used) for the duration of the COVID-19 declaration under Section 564(b)(1) of the Act, 21 U.S.C. section 360bbb-3(b)(1), unless the authorization is terminated or revoked.  Performed at [facility], [address], [place], [place] [address]   Surgical pcr screen     Status: None   Collection Time: 05/22/21  3:44 PM   Specimen: Nasal Mucosa; Nasal Swab  Result Value Ref Range Status   MRSA, PCR NEGATIVE NEGATIVE Final   Staphylococcus aureus NEGATIVE NEGATIVE Final    Comment: (NOTE) The Xpert SA Assay (FDA approved for NASAL specimens in patients 30 years of age and older), is one component of a comprehensive surveillance program. It is not intended to diagnose infection nor to guide or monitor treatment. Performed at [facility], [place] [address], [place], [place] [address]           Radiology Studies: No results found.      Scheduled Meds:  aspirin EC  325 mg Oral Q breakfast   Chlorhexidine Gluconate Cloth  6 each Topical Daily   cholestyramine light  4 g Oral BID   docusate sodium  100 mg Oral BID   feeding supplement  237 mL Oral TID BM   iron polysaccharides  150 mg Oral Q breakfast   levothyroxine  50  mcg Oral QAC breakfast   melatonin  5 mg Oral QHS   multivitamin with minerals  1 tablet Oral Daily   pantoprazole  40 mg Oral Daily   saccharomyces boulardii  250 mg Oral BID   senna  1 tablet Oral BID   tamsulosin  0.4 mg Oral Daily   Continuous Infusions:     LOS: 4 days   Time spent: 65min  [name] C [place] Yarbro, DO [facility]  If 7PM-7AM, please contact night-coverage [email or web address]  05/26/2021, 7:34 AM

## 2021-05-27 ENCOUNTER — Inpatient Hospital Stay (HOSPITAL_COMMUNITY): Payer: Medicare Other

## 2021-05-27 ENCOUNTER — Encounter (HOSPITAL_COMMUNITY): Payer: Self-pay | Admitting: Orthopedic Surgery

## 2021-05-27 DIAGNOSIS — S72002A Fracture of unspecified part of neck of left femur, initial encounter for closed fracture: Secondary | ICD-10-CM | POA: Diagnosis not present

## 2021-05-27 LAB — RESP PANEL BY RT-PCR (FLU A&B, COVID) ARPGX2
Influenza A by PCR: NEGATIVE
Influenza A by PCR: NEGATIVE
Influenza B by PCR: NEGATIVE
Influenza B by PCR: NEGATIVE
SARS Coronavirus 2 by RT PCR: POSITIVE — AB
SARS Coronavirus 2 by RT PCR: POSITIVE — AB

## 2021-05-27 NOTE — Progress Notes (Signed)
Xray of the pelvis following unwitnessed fall inconclusive. PTAR cancelled, patient will have to be kept in hospital until further evaluation. [facility] updated, Dr Avon Gully informed, and POA, Debbie, called.

## 2021-05-27 NOTE — Progress Notes (Signed)
Was the fall witnessed: no  Patient condition before and after the fall: patient intermittently confused, got out of chair unassisted and fell. RN found patient on floor. Assisted patient back to bed with help of staff. Patient states he did not hit his head and does not have pain.   Patient's reaction to the fall: states he was "trying to get up to straighten up the room"  Name of the doctor that was notified including date and time: Dr Avon Gully, notified 1010 at 1800  Any interventions and vital signs: pelvic xray ordered, results pending

## 2021-05-27 NOTE — Progress Notes (Signed)
Physical Therapy Treatment Patient Details Name: [name] MRN: [id number] DOB: 1925/02/16 Today's Date: 05/27/2021   History of Present Illness Patient is a 87 year old male that had a fall ~3 weeks ago and has been unable to ambulate at nursing facility. Pt + for L hip fx, s/p ORIF. PMH includes previous falls, BPH, spermatocele, bladder diverticuli, rhabdomyolysis, history of AKI, normocytic anemia, osteoarthritis, cerebral atrophy, history of cerebrovascular disease, scoliosis of C-spine and lumbar spine, lumbar DJD, GERD, history of Mallory-Weiss tear, hypercalcemia, osteoporosis, hyperlipidemia, hypertension, history of herpes zoster    PT Comments    Pt tested positive for covid this morning, retest was also positive.  Min A for bed mobility. Sit to stand +2 max assist, pt has posterior lean in standing. Used Stedy to transfer to [employer]. Performed LLE strengthening exercises. Pt stated, "there will be all kinds of lawsuits if you don't get me back to [facility] today". Pt notified that [facility] agreed to take him back today.    Recommendations for follow up therapy are one component of a multi-disciplinary discharge planning process, led by the attending physician.  Recommendations may be updated based on patient status, additional functional criteria and insurance authorization.  Follow Up Recommendations  SNF     Equipment Recommendations  None recommended by PT    Recommendations for Other Services       Precautions / Restrictions Precautions Precautions: Fall Precaution Comments: bilateral feet sliding forward Restrictions Weight Bearing Restrictions: No LLE Weight Bearing: Weight bearing as tolerated     Mobility  Bed Mobility Overal bed mobility: Needs Assistance Bed Mobility: Supine to Sit     Supine to sit: Min assist     General bed mobility comments: assist to scoot hips to EOB    Transfers Overall transfer level: Needs assistance   Transfers:  Sit to/from Stand;Stand Pivot Transfers Sit to Stand: Max assist;+2 physical assistance Stand pivot transfers: Total assist       General transfer comment: posterior lean, max A to come to upright position in [place], but still with posterior lean (unable to close flaps of stedy due to flexed posture at hips); used stedy to pivot to recliner  Ambulation/Gait             General Gait Details: Unsafe to attempt gait - see difficulty with transfers   Stairs             Wheelchair Mobility    Modified Rankin (Stroke Patients Only)       Balance Overall balance assessment: History of Falls;Needs assistance Sitting-balance support: Feet supported Sitting balance-Leahy Scale: Fair   Postural control: Posterior lean Standing balance support: Bilateral upper extremity supported Standing balance-Leahy Scale: Zero Standing balance comment: max A x2 with rolling walker                            Cognition Arousal/Alertness: Awake/alert Behavior During Therapy: WFL for tasks assessed/performed Overall Cognitive Status: History of cognitive impairments - at baseline                                 General Comments: patient with hx of dementia, is aware had hip surgery      Exercises General Exercises - Lower Extremity Ankle Circles/Pumps: AROM;Both;10 reps;Supine Heel Slides: AAROM;Left;15 reps;Supine Hip ABduction/ADduction: AAROM;Left;15 reps;Supine    General Comments        Pertinent  Vitals/Pain Pain Assessment: Faces Faces Pain Scale: No hurt    Home Living Family/patient expects to be discharged to:: Skilled nursing facility                    Prior Function Level of Independence: Needs assistance      Comments: Unsure as patient is unreliable historian. Per chart review patient was ambulatory with staff using rolling walker prior to fall ~3 weeks ago. Since fall has primarily been bed bound.   PT Goals (current goals  can now be found in the care plan section) Acute Rehab PT Goals Patient Stated Goal: use toilet PT Goal Formulation: With patient Time For Goal Achievement: 06/07/21 Potential to Achieve Goals: Fair Progress towards PT goals: Progressing toward goals    Frequency    Min 3X/week      PT Plan Current plan remains appropriate    Co-evaluation PT/OT/SLP Co-Evaluation/Treatment: Yes            AM-PAC PT "6 Clicks" Mobility   Outcome Measure  Help needed turning from your back to your side while in a flat bed without using bedrails?: A Little Help needed moving from lying on your back to sitting on the side of a flat bed without using bedrails?: A Little Help needed moving to and from a bed to a chair (including a wheelchair)?: Total Help needed standing up from a chair using your arms (e.g., wheelchair or bedside chair)?: Total Help needed to walk in hospital room?: Total Help needed climbing 3-5 steps with a railing? : Total 6 Click Score: 10    End of Session Equipment Utilized During Treatment: Gait belt Activity Tolerance: Patient tolerated treatment well Patient left: in chair;with call bell/phone within reach;with chair alarm set Nurse Communication: Mobility status PT Visit Diagnosis: Unsteadiness on feet (R26.81);History of falling (Z91.81);Difficulty in walking, not elsewhere classified (R26.2)     Time: 1610-9604 PT Time Calculation (min) (ACUTE ONLY): 16 min  Charges:  $Therapeutic Activity: 8-22 mins                     Blondell Reveal Kistler PT 05/27/2021  Acute Rehabilitation Services Pager [phone] Office [phone]

## 2021-05-27 NOTE — Progress Notes (Signed)
RN informed ortho PA, Laurine Blazer of xray results. Ortho will follow up with patient tomorrow after a CT scan is done to verify results of xray.

## 2021-05-27 NOTE — Discharge Summary (Addendum)
Physician Discharge Summary  [name] YKD:[id number] DOB: 21-Jun-1925 DOA: 05/21/2021  PCP: [name], MD  Admit date: 05/21/2021 Discharge date: 05/27/2021  Admitted From: SNF Disposition: SNF  Recommendations for Outpatient Follow-up:  Follow up with PCP in 1-2 weeks Please obtain BMP/CBC in one week  Discharge Condition: Stable CODE STATUS: DNR Diet recommendation: Regular diet as tolerated  Brief/Interim Summary: [name] is a 87 y.o. male with medical history significant of BPH, spermatocele, bladder diverticuli, rhabdomyolysis, history of AKI, normocytic anemia, osteoarthritis, cerebral atrophy, history of cerebrovascular disease, scoliosis of C-spine and lumbar spine, lumbar DJD, GERD, history of Mallory-Weiss tear, hypercalcemia, osteoporosis, hyperlipidemia, hypertension, history of herpes zoster with chronic indwelling Foley and PEG tube who presents to the ED with worsening hip pain and ambulatory dysfunction noted to have left subcapital femoral fracture on imaging with fall most recently 3 weeks ago concerning for subacute injury.  Orthopedics consulted at intake for further evaluation and treatment TRH consulted for admission.   Patient tolerated ORIF on 05/23/2021 quite well, PT following recommending discharge back to rehab for ongoing physical therapy and care.  Otherwise stable for discharge.  Patient slipped from chair and landed on his sacrum without deficits or radiating pain - plain film cannot rule out fracture near surgical site - CT pending. Transfer delayed until confirmation with CT.   Assessment & Plan:  Closed left hip fracture ([place]) -Orthopedics following, status post ORIF 05/23/21 w/ Ortho (Swinteck) -weightbearing as tolerated, aspirin twice daily for DVT prophylaxis, outpatient follow-up in 2 weeks -Pain currently well controlled on current regimen, continue as is  -PT OT to follow afterwards, likely discharge back to current nursing facility for  ongoing PT    PEG tube placement, chronic Patient extremely poor historian given baseline dementia and advanced age  Continue to advance p.o. diet as tolerated   GERD (gastroesophageal reflux disease) Continue pantoprazole 40 mg p.o. daily.   Protein-calorie malnutrition, moderate (HCC) Protein supplementation. Consult nutritional services.   CKD (chronic kidney disease), stage III (HCC) History of BPH. Has permanent Foley. Monitor electrolytes and GFR.   Hypothyroidism Continue levothyroxine 50 mcg p.o. daily.   Cognitive impairment Supportive care.   Iron deficiency anemia Monitor H&H. Transfuse as needed.   Discharge Instructions  Discharge Instructions     Call MD for:  redness, tenderness, or signs of infection (pain, swelling, redness, odor or green/yellow discharge around incision site)   Complete by: As directed    Call MD for:  temperature >100.4   Complete by: As directed    Diet - low sodium heart healthy   Complete by: As directed    Increase activity slowly   Complete by: As directed    Leave dressing on - Keep it clean, dry, and intact until clinic visit   Complete by: As directed       Allergies as of 05/27/2021       Reactions   Amrix [cyclobenzaprine]    Shellfish Allergy    Not documented on the [facility]        Medication List     TAKE these medications    acetaminophen 500 MG tablet Commonly known as: TYLENOL Take 1,000 mg by mouth every 8 (eight) hours.   antiseptic oral rinse Liqd 15 mLs by Mouth Rinse route 4 (four) times daily as needed for dry mouth.   bisacodyl 10 MG suppository Commonly known as: DULCOLAX Place 10 mg rectally as needed for moderate constipation.   cholestyramine 4 g packet Commonly  known as: Questran Take 1 packet (4 g total) by mouth 2 (two) times daily.   iron polysaccharides 150 MG capsule Commonly known as: NIFEREX Take 150 mg by mouth See admin instructions. Give 150mg  capsule by mouth once a  morning with meal and 2 hours separate from Questran   lactose free nutrition Liqd Take 237 mLs by mouth daily as needed (as residents request).   levothyroxine 50 MCG tablet Commonly known as: SYNTHROID Take 50 mcg by mouth daily before breakfast.   loperamide 2 MG capsule Commonly known as: IMODIUM Take 2 mg by mouth every 8 (eight) hours as needed for diarrhea or loose stools.   Melatonin 5 MG Caps Take 1 capsule (5 mg total) by mouth at bedtime.   ondansetron 4 MG tablet Commonly known as: ZOFRAN Take 4 mg by mouth every 6 (six) hours as needed for nausea or vomiting.   pantoprazole 40 MG tablet Commonly known as: PROTONIX Take 40 mg by mouth daily.   saccharomyces boulardii 250 MG capsule Commonly known as: FLORASTOR Take 250 mg by mouth 2 (two) times daily.   Systane 0.4-0.3 % Soln Generic drug: Polyethyl Glycol-Propyl Glycol Two drops both eyes three times daily as needed for dry itchy eyes.   tamsulosin 0.4 MG Caps capsule Commonly known as: FLOMAX Take 1 capsule (0.4 mg total) by mouth daily.   traMADol 50 MG tablet Commonly known as: ULTRAM Take 0.5-1 tablets (25-50 mg total) by mouth every 8 (eight) hours as needed for up to 5 days. 25 mg for mild pain, 50 mg for severe pain What changed:  when to take this reasons to take this additional instructions   triamcinolone cream 0.1 % Commonly known as: KENALOG Apply 1 application topically to back daily as needed for itching or rash               Discharge Care Instructions  (From admission, onward)           Start     Ordered   05/27/21 0000  Leave dressing on - Keep it clean, dry, and intact until clinic visit        05/27/21 5732            Follow-up Information     Swinteck, Aaron Edelman, MD. Schedule an appointment as soon as possible for a visit in 2 week(s).   Specialty: Orthopedic Surgery Why: For wound re-check Contact information: [address] [place]  [address] [phone]                Allergies  Allergen Reactions   Amrix [Cyclobenzaprine]    Shellfish Allergy     Not documented on the [facility]    Consultations: Ortho - ORIF, left hip 05/23/2021   Procedures/Studies: Pelvis Portable  Result Date: 05/23/2021 CLINICAL DATA:  Status post ORIF of left hip fracture EXAM: PORTABLE PELVIS 1-2 VIEWS COMPARISON:  Intraoperative films from earlier in the same day. FINDINGS: Three compression screws are noted traversing the femoral neck on the left into the femoral head. The overall appearance is similar to that seen on the intraoperative films. No acute bony abnormality is noted. IMPRESSION: Status post ORIF of left femoral neck fracture. Electronically Signed   By: Inez Catalina M.D.   On: 05/23/2021 19:46   CT HIP LEFT WO CONTRAST  Result Date: 05/22/2021 CLINICAL DATA:  Hip trauma, fracture suspected. Patient fell 3 weeks ago and is unable to bear weight. EXAM: CT OF THE LEFT HIP  WITHOUT CONTRAST TECHNIQUE: Multidetector CT imaging of the left hip was performed according to the standard protocol. Multiplanar CT image reconstructions were also generated. COMPARISON:  Radiographs 05/21/2021 and pelvic CT 11/24/2020. FINDINGS: Bones/Joint/Cartilage The bones appear adequately mineralized. The femoral head is located. There is a minimally displaced subcapital fracture of the left femoral neck with minimal valgus angulation. This fracture demonstrates no intertrochanteric extension. There are underlying moderate left hip degenerative changes with a small joint effusion. The visualized left hemipelvis is intact. Ligaments Suboptimally assessed by CT. Muscles and Tendons Mild generalized muscular atrophy without focal intramuscular hematoma. Soft tissues Iliofemoral atherosclerosis. No significant periarticular hematoma. Prominent stool in the rectum noted. IMPRESSION: CT confirms the presence of a mildly displaced and angulated subcapital fracture of  the left femoral neck. Underlying moderate left hip degenerative changes. Electronically Signed   By: Richardean Sale M.D.   On: 05/22/2021 14:50   DG Chest Port 1 View  Result Date: 05/21/2021 CLINICAL DATA:  Preop.  Left hip fracture. EXAM: PORTABLE CHEST 1 VIEW COMPARISON:  11/24/2020 FINDINGS: Normal heart size. No pericardial effusion. Scratch set no pleural effusion or edema. No airspace opacities identified. Calcified granuloma is again noted within the lingula. The visualized osseous structures are unremarkable. IMPRESSION: No acute cardiopulmonary abnormalities. Electronically Signed   By: Kerby Moors M.D.   On: 05/21/2021 19:33   DG C-Arm 1-60 Min-No Report  Result Date: 05/23/2021 Fluoroscopy was utilized by the requesting physician.  No radiographic interpretation.   DG HIP OPERATIVE UNILAT W OR W/O PELVIS LEFT  Result Date: 05/23/2021 CLINICAL DATA:  Left hip surgery EXAM: OPERATIVE left HIP (WITH PELVIS IF PERFORMED) 2 VIEWS TECHNIQUE: Fluoroscopic spot image(s) were submitted for interpretation post-operatively. COMPARISON:  05/21/2021 FINDINGS: Two low resolution intraoperative spot views of the left hip. Total fluoroscopy time was 46 seconds. The images demonstrate 3 threaded screw fixation of the left femur for femoral neck fracture. IMPRESSION: Intraoperative fluoroscopic assistance provided during left hip surgery Electronically Signed   By: Donavan Foil M.D.   On: 05/23/2021 17:00   DG Hip Unilat W or Wo Pelvis 2-3 Views Left  Result Date: 05/21/2021 CLINICAL DATA:  Hip pain, fall 3 weeks ago EXAM: DG HIP (WITH OR WITHOUT PELVIS) 2-3V LEFT COMPARISON:  CT 11/24/2020 FINDINGS: Findings are suspicious for subacute nondisplaced left subcapital femoral neck fracture. No femoral head dislocation. Mild degenerative changes of both hips. Pubic symphysis and rami are intact. IMPRESSION: Findings suspicious for subacute left subcapital femoral neck fracture Electronically Signed   By:  Donavan Foil M.D.   On: 05/21/2021 18:48    Subjective: No acute issues or events overnight  Discharge Exam: Vitals:   05/26/21 2102 05/27/21 0412  BP: 132/80 139/78  Pulse: 78 (!) 49  Resp: 17 17  Temp: 98.6 F (37 C) 98.2 F (36.8 C)  SpO2: 97% 96%   Vitals:   05/26/21 0524 05/26/21 1406 05/26/21 2102 05/27/21 0412  BP: (!) 152/83 98/61 132/80 139/78  Pulse: (!) 58 60 78 (!) 49  Resp: 20 14 17 17   Temp: 97.9 F (36.6 C) 98.3 F (36.8 C) 98.6 F (37 C) 98.2 F (36.8 C)  TempSrc: Oral Oral Oral Oral  SpO2: 97% 98% 97% 96%  Weight:      Height:        General: Pt is alert, awake, not in acute distress Cardiovascular: RRR, S1/S2 +, no rubs, no gallops Respiratory: CTA bilaterally, no wheezing, no rhonchi Abdominal: Soft, NT, ND, bowel sounds +  Extremities: no edema, no cyanosis    The results of significant diagnostics from this hospitalization (including imaging, microbiology, ancillary and laboratory) are listed below for reference.     Microbiology: Recent Results (from the past 240 hour(s))  Resp Panel by RT-PCR (Flu A&B, Covid) Nasopharyngeal Swab     Status: None   Collection Time: 05/21/21  8:07 PM   Specimen: Nasopharyngeal Swab; Nasopharyngeal(NP) swabs in vial transport medium  Result Value Ref Range Status   SARS Coronavirus 2 by RT PCR NEGATIVE NEGATIVE Final    Comment: (NOTE) SARS-CoV-2 target nucleic acids are NOT DETECTED.  The SARS-CoV-2 RNA is generally detectable in upper respiratory specimens during the acute phase of infection. The lowest concentration of SARS-CoV-2 viral copies this assay can detect is 138 copies/mL. A negative result does not preclude SARS-Cov-2 infection and should not be used as the sole basis for treatment or other patient management decisions. A negative result may occur with  improper specimen collection/handling, submission of specimen other than nasopharyngeal swab, presence of viral mutation(s) within the areas  targeted by this assay, and inadequate number of viral copies(<138 copies/mL). A negative result must be combined with clinical observations, patient history, and epidemiological information. The expected result is Negative.  Fact Sheet for Patients:  EntrepreneurPulse.com.au  Fact Sheet for Healthcare Providers:  IncredibleEmployment.be  This test is no t yet approved or cleared by the [place] FDA and  has been authorized for detection and/or diagnosis of SARS-CoV-2 by FDA under an Emergency Use Authorization (EUA). This EUA will remain  in effect (meaning this test can be used) for the duration of the COVID-19 declaration under Section 564(b)(1) of the Act, 21 U.S.C.section 360bbb-3(b)(1), unless the authorization is terminated  or revoked sooner.       Influenza A by PCR NEGATIVE NEGATIVE Final   Influenza B by PCR NEGATIVE NEGATIVE Final    Comment: (NOTE) The Xpert Xpress SARS-CoV-2/FLU/RSV plus assay is intended as an aid in the diagnosis of influenza from Nasopharyngeal swab specimens and should not be used as a sole basis for treatment. Nasal washings and aspirates are unacceptable for Xpert Xpress SARS-CoV-2/FLU/RSV testing.  Fact Sheet for Patients: EntrepreneurPulse.com.au  Fact Sheet for Healthcare Providers: IncredibleEmployment.be  This test is not yet approved or cleared by the [place] FDA and has been authorized for detection and/or diagnosis of SARS-CoV-2 by FDA under an Emergency Use Authorization (EUA). This EUA will remain in effect (meaning this test can be used) for the duration of the COVID-19 declaration under Section 564(b)(1) of the Act, 21 U.S.C. section 360bbb-3(b)(1), unless the authorization is terminated or revoked.  Performed at [facility], [address], [place], [place] [address]   Surgical pcr screen     Status: None   Collection  Time: 05/22/21  3:44 PM   Specimen: Nasal Mucosa; Nasal Swab  Result Value Ref Range Status   MRSA, PCR NEGATIVE NEGATIVE Final   Staphylococcus aureus NEGATIVE NEGATIVE Final    Comment: (NOTE) The Xpert SA Assay (FDA approved for NASAL specimens in patients 85 years of age and older), is one component of a comprehensive surveillance program. It is not intended to diagnose infection nor to guide or monitor treatment. Performed at [facility], [place] [address], [place],  [address]      Labs: BNP (last 3 results) No results for input(s): BNP in the last 8760 hours. Basic Metabolic Panel: Recent Labs  Lab 05/21/21 2022 05/22/21 0538 05/23/21 0328 05/24/21  0331 05/25/21 0324  NA 134* 133* 133* 134* 137  K 3.6 3.6 3.8 3.9 4.0  CL 99 99 102 103 103  CO2 25 25 25 25 26   GLUCOSE 101* 94 95 163* 101*  BUN 18 16 18 19 22   CREATININE 1.12 1.17 1.16 1.28* 1.14  CALCIUM 8.5* 7.9* 8.1* 8.4* 8.4*   Liver Function Tests: Recent Labs  Lab 05/22/21 0538 05/23/21 0328  AST 15 13*  ALT 9 9  ALKPHOS 70 74  BILITOT 0.5 0.6  PROT 6.3* 6.3*  ALBUMIN 2.6* 2.6*   No results for input(s): LIPASE, AMYLASE in the last 168 hours. No results for input(s): AMMONIA in the last 168 hours. CBC: Recent Labs  Lab 05/21/21 2007 05/22/21 5284 05/23/21 0328 05/24/21 0331 05/25/21 0324 05/26/21 0321  WBC 11.2* 8.9 8.6 7.9 11.0* 12.1*  NEUTROABS 7.4  --   --   --   --   --   HGB 9.5* 8.2* 8.0* 8.0* 7.3* 7.6*  HCT 31.0* 25.5* 25.6* 26.1* 22.9* 25.1*  MCV 80.3 81.0 80.0 80.6 80.1 80.2  PLT 533* 429* 452* 497* 442* 511*   Cardiac Enzymes: No results for input(s): CKTOTAL, CKMB, CKMBINDEX, TROPONINI in the last 168 hours. BNP: Invalid input(s): POCBNP CBG: Recent Labs  Lab 05/25/21 0843  GLUCAP 79   D-Dimer No results for input(s): DDIMER in the last 72 hours. Hgb A1c No results for input(s): HGBA1C in the last 72 hours. Lipid Profile No results for  input(s): CHOL, HDL, LDLCALC, TRIG, CHOLHDL, LDLDIRECT in the last 72 hours. Thyroid function studies No results for input(s): TSH, T4TOTAL, T3FREE, THYROIDAB in the last 72 hours.  Invalid input(s): FREET3 Anemia work up No results for input(s): VITAMINB12, FOLATE, FERRITIN, TIBC, IRON, RETICCTPCT in the last 72 hours. Urinalysis    Component Value Date/Time   COLORURINE YELLOW 11/24/2020 0358   APPEARANCEUR CLEAR 11/24/2020 0358   LABSPEC 1.016 11/24/2020 0358   PHURINE 6.0 11/24/2020 0358   GLUCOSEU NEGATIVE 11/24/2020 0358   HGBUR NEGATIVE 11/24/2020 0358   BILIRUBINUR NEGATIVE 11/24/2020 0358   KETONESUR NEGATIVE 11/24/2020 0358   PROTEINUR NEGATIVE 11/24/2020 0358   UROBILINOGEN 0.2 04/21/2007 1451   NITRITE NEGATIVE 11/24/2020 0358   LEUKOCYTESUR NEGATIVE 11/24/2020 0358   Sepsis Labs Invalid input(s): PROCALCITONIN,  WBC,  LACTICIDVEN Microbiology Recent Results (from the past 240 hour(s))  Resp Panel by RT-PCR (Flu A&B, Covid) Nasopharyngeal Swab     Status: None   Collection Time: 05/21/21  8:07 PM   Specimen: Nasopharyngeal Swab; Nasopharyngeal(NP) swabs in vial transport medium  Result Value Ref Range Status   SARS Coronavirus 2 by RT PCR NEGATIVE NEGATIVE Final    Comment: (NOTE) SARS-CoV-2 target nucleic acids are NOT DETECTED.  The SARS-CoV-2 RNA is generally detectable in upper respiratory specimens during the acute phase of infection. The lowest concentration of SARS-CoV-2 viral copies this assay can detect is 138 copies/mL. A negative result does not preclude SARS-Cov-2 infection and should not be used as the sole basis for treatment or other patient management decisions. A negative result may occur with  improper specimen collection/handling, submission of specimen other than nasopharyngeal swab, presence of viral mutation(s) within the areas targeted by this assay, and inadequate number of viral copies(<138 copies/mL). A negative result must be combined  with clinical observations, patient history, and epidemiological information. The expected result is Negative.  Fact Sheet for Patients:  EntrepreneurPulse.com.au  Fact Sheet for Healthcare Providers:  IncredibleEmployment.be  This test is no t yet  approved or cleared by the [place] and  has been authorized for detection and/or diagnosis of SARS-CoV-2 by FDA under an Emergency Use Authorization (EUA). This EUA will remain  in effect (meaning this test can be used) for the duration of the COVID-19 declaration under Section 564(b)(1) of the Act, 21 U.S.C.section 360bbb-3(b)(1), unless the authorization is terminated  or revoked sooner.       Influenza A by PCR NEGATIVE NEGATIVE Final   Influenza B by PCR NEGATIVE NEGATIVE Final    Comment: (NOTE) The Xpert Xpress SARS-CoV-2/FLU/RSV plus assay is intended as an aid in the diagnosis of influenza from Nasopharyngeal swab specimens and should not be used as a sole basis for treatment. Nasal washings and aspirates are unacceptable for Xpert Xpress SARS-CoV-2/FLU/RSV testing.  Fact Sheet for Patients: EntrepreneurPulse.com.au  Fact Sheet for Healthcare Providers: IncredibleEmployment.be  This test is not yet approved or cleared by the [place] FDA and has been authorized for detection and/or diagnosis of SARS-CoV-2 by FDA under an Emergency Use Authorization (EUA). This EUA will remain in effect (meaning this test can be used) for the duration of the COVID-19 declaration under Section 564(b)(1) of the Act, 21 U.S.C. section 360bbb-3(b)(1), unless the authorization is terminated or revoked.  Performed at [facility], [address] [place], [place] [address]   Surgical pcr screen     Status: None   Collection Time: 05/22/21  3:44 PM   Specimen: Nasal Mucosa; Nasal Swab  Result Value Ref Range Status   MRSA, PCR NEGATIVE  NEGATIVE Final   Staphylococcus aureus NEGATIVE NEGATIVE Final    Comment: (NOTE) The Xpert SA Assay (FDA approved for NASAL specimens in patients 26 years of age and older), is one component of a comprehensive surveillance program. It is not intended to diagnose infection nor to guide or monitor treatment. Performed at [facility], [place] [address] [place], [place] [address]      Time coordinating discharge: Over 30 minutes  SIGNED:   Little Ishikawa, DO [facility] 05/27/2021, 7:52 AM Pager   If 7PM-7AM, please contact night-coverage [email or web address]

## 2021-05-27 NOTE — TOC Transition Note (Addendum)
Transition of Care [facility]) - CM/SW Discharge Note  Patient Details  Name: [name] MRN: [id number] Date of Birth: 01-26-1925  [facility] CM/SW Contact:  Sherie Don, LCSW Phone Number: 05/27/2021, 2:38 PM  Clinical Narrative: Patient to discharge back to [facility]. Patient is COVID+, but per Butch Penny in admissions the patient can return today. Patient will go to room 132 and the number for report is [phone]. Discharge summary, discharge orders, and SNF transfer report sent to facility in hub. Medical necessity form done; PTAR scheduled. Discharge packet completed. RN updated. TOC signing off.  Addendum: 05/28/21-Patient will be discharged to [facility] today as patient fell on his sacrum yesterday and needed an x-ray. New medical necessity form done; discharge summary printed. PTAR scheduled. RN notified.  Final next level of care: Skilled Nursing Facility Barriers to Discharge: Barriers Resolved  Patient Goals and CMS Choice Patient states their goals for this hospitalization and ongoing recovery are:: Return to [facility] CMS Medicare.gov Compare Post Acute Care list provided to:: Patient Choice offered to / list presented to : Patient  Discharge Placement        Patient chooses bed at: [facility] Patient to be transferred to facility by: [facility]         DME Arranged: N/A DME Agency: NA  Readmission Risk Interventions No flowsheet data found.

## 2021-05-27 NOTE — Progress Notes (Signed)
Report given to [facility] at [facility]. Waiting on PTAR. Will continue to monitor.

## 2021-05-28 DIAGNOSIS — S72002A Fracture of unspecified part of neck of left femur, initial encounter for closed fracture: Secondary | ICD-10-CM | POA: Diagnosis not present

## 2021-05-28 NOTE — Discharge Summary (Signed)
Physician Discharge Summary  [name] WNI:[id number] DOB: [birth year] DOA: 05/21/2021  PCP: [name], MD  Admit date: 05/21/2021 Discharge date: 05/27/2021  Admitted From: SNF Disposition: SNF  Recommendations for Outpatient Follow-up:  Follow up with PCP in 1-2 weeks Please obtain BMP/CBC in one week  Discharge Condition: Stable CODE STATUS: DNR Diet recommendation: Regular diet as tolerated  Brief/Interim Summary: [name] is a 87 y.o. male with medical history significant of BPH, spermatocele, bladder diverticuli, rhabdomyolysis, history of AKI, normocytic anemia, osteoarthritis, cerebral atrophy, history of cerebrovascular disease, scoliosis of C-spine and lumbar spine, lumbar DJD, GERD, history of Mallory-Weiss tear, hypercalcemia, osteoporosis, hyperlipidemia, hypertension, history of herpes zoster with chronic indwelling Foley and PEG tube who presents to the ED with worsening hip pain and ambulatory dysfunction noted to have left subcapital femoral fracture on imaging with fall most recently 3 weeks ago concerning for subacute injury.  Orthopedics consulted at intake for further evaluation and treatment TRH consulted for admission.   Patient tolerated ORIF on 05/23/2021 quite well, PT following recommending discharge back to rehab for ongoing physical therapy and care.  Otherwise stable for discharge. Unfortunately covid positive yesterday - facility able to take him back - likely poor swab/false negative at intake - would not recommend quarantine unless symptomatic at this point given his lack of symptoms.  Patient slipped from chair and landed on his sacrum without deficits or radiating pain - plain film cannot rule out fracture near surgical site - CT reassuring for no fracture - otherwise stable for discharge.   Assessment & Plan:  Closed left hip fracture ([place]) -Orthopedics following, status post ORIF 05/23/21 w/ Ortho (Swinteck) -weightbearing as tolerated, aspirin  twice daily for DVT prophylaxis, outpatient follow-up in 2 weeks -Pain currently well controlled on current regimen, continue as is  -PT OT to follow afterwards, likely discharge back to current nursing facility for ongoing PT    PEG tube placement, chronic Patient extremely poor historian given baseline dementia and advanced age  Continue to advance p.o. diet as tolerated   GERD (gastroesophageal reflux disease) Continue pantoprazole 40 mg p.o. daily.   Protein-calorie malnutrition, moderate (HCC) Protein supplementation. Consult nutritional services.   CKD (chronic kidney disease), stage III (HCC) History of BPH. Has permanent Foley. Monitor electrolytes and GFR.   Hypothyroidism Continue levothyroxine 50 mcg p.o. daily.   Cognitive impairment Supportive care.   Iron deficiency anemia Monitor H&H. Transfuse as needed.   Discharge Instructions  Discharge Instructions     Call MD for:  redness, tenderness, or signs of infection (pain, swelling, redness, odor or green/yellow discharge around incision site)   Complete by: As directed    Call MD for:  temperature >100.4   Complete by: As directed    Diet - low sodium heart healthy   Complete by: As directed    Increase activity slowly   Complete by: As directed    Leave dressing on - Keep it clean, dry, and intact until clinic visit   Complete by: As directed       Allergies as of 05/27/2021       Reactions   Amrix [cyclobenzaprine]    Shellfish Allergy    Not documented on the [facility]        Medication List     TAKE these medications    acetaminophen 500 MG tablet Commonly known as: TYLENOL Take 1,000 mg by mouth every 8 (eight) hours.   antiseptic oral rinse Liqd 15 mLs by Mouth Rinse route  4 (four) times daily as needed for dry mouth.   bisacodyl 10 MG suppository Commonly known as: DULCOLAX Place 10 mg rectally as needed for moderate constipation.   cholestyramine 4 g packet Commonly known as:  Questran Take 1 packet (4 g total) by mouth 2 (two) times daily.   iron polysaccharides 150 MG capsule Commonly known as: NIFEREX Take 150 mg by mouth See admin instructions. Give 150mg  capsule by mouth once a morning with meal and 2 hours separate from Questran   lactose free nutrition Liqd Take 237 mLs by mouth daily as needed (as residents request).   levothyroxine 50 MCG tablet Commonly known as: SYNTHROID Take 50 mcg by mouth daily before breakfast.   loperamide 2 MG capsule Commonly known as: IMODIUM Take 2 mg by mouth every 8 (eight) hours as needed for diarrhea or loose stools.   Melatonin 5 MG Caps Take 1 capsule (5 mg total) by mouth at bedtime.   ondansetron 4 MG tablet Commonly known as: ZOFRAN Take 4 mg by mouth every 6 (six) hours as needed for nausea or vomiting.   pantoprazole 40 MG tablet Commonly known as: PROTONIX Take 40 mg by mouth daily.   saccharomyces boulardii 250 MG capsule Commonly known as: FLORASTOR Take 250 mg by mouth 2 (two) times daily.   Systane 0.4-0.3 % Soln Generic drug: Polyethyl Glycol-Propyl Glycol Two drops both eyes three times daily as needed for dry itchy eyes.   tamsulosin 0.4 MG Caps capsule Commonly known as: FLOMAX Take 1 capsule (0.4 mg total) by mouth daily.   traMADol 50 MG tablet Commonly known as: ULTRAM Take 0.5-1 tablets (25-50 mg total) by mouth every 8 (eight) hours as needed for up to 5 days. 25 mg for mild pain, 50 mg for severe pain What changed:  when to take this reasons to take this additional instructions   triamcinolone cream 0.1 % Commonly known as: KENALOG Apply 1 application topically to back daily as needed for itching or rash               Discharge Care Instructions  (From admission, onward)           Start     Ordered   05/27/21 0000  Leave dressing on - Keep it clean, dry, and intact until clinic visit        05/27/21 7253            Follow-up Information      Swinteck, Aaron Edelman, MD. Schedule an appointment as soon as possible for a visit in 2 week(s).   Specialty: Orthopedic Surgery Why: For wound re-check Contact information: [address] [place] [address] [phone]                Allergies  Allergen Reactions   Amrix [Cyclobenzaprine]    Shellfish Allergy     Not documented on the [facility]    Consultations: Ortho - ORIF, left hip 05/23/2021   Procedures/Studies: Pelvis Portable  Result Date: 05/23/2021 CLINICAL DATA:  Status post ORIF of left hip fracture EXAM: PORTABLE PELVIS 1-2 VIEWS COMPARISON:  Intraoperative films from earlier in the same day. FINDINGS: Three compression screws are noted traversing the femoral neck on the left into the femoral head. The overall appearance is similar to that seen on the intraoperative films. No acute bony abnormality is noted. IMPRESSION: Status post ORIF of left femoral neck fracture. Electronically Signed   By: Inez Catalina M.D.   On: 05/23/2021 19:46  CT HIP LEFT WO CONTRAST  Result Date: 05/22/2021 CLINICAL DATA:  Hip trauma, fracture suspected. Patient fell 3 weeks ago and is unable to bear weight. EXAM: CT OF THE LEFT HIP WITHOUT CONTRAST TECHNIQUE: Multidetector CT imaging of the left hip was performed according to the standard protocol. Multiplanar CT image reconstructions were also generated. COMPARISON:  Radiographs 05/21/2021 and pelvic CT 11/24/2020. FINDINGS: Bones/Joint/Cartilage The bones appear adequately mineralized. The femoral head is located. There is a minimally displaced subcapital fracture of the left femoral neck with minimal valgus angulation. This fracture demonstrates no intertrochanteric extension. There are underlying moderate left hip degenerative changes with a small joint effusion. The visualized left hemipelvis is intact. Ligaments Suboptimally assessed by CT. Muscles and Tendons Mild generalized muscular atrophy without focal intramuscular  hematoma. Soft tissues Iliofemoral atherosclerosis. No significant periarticular hematoma. Prominent stool in the rectum noted. IMPRESSION: CT confirms the presence of a mildly displaced and angulated subcapital fracture of the left femoral neck. Underlying moderate left hip degenerative changes. Electronically Signed   By: Richardean Sale M.D.   On: 05/22/2021 14:50   DG Chest Port 1 View  Result Date: 05/21/2021 CLINICAL DATA:  Preop.  Left hip fracture. EXAM: PORTABLE CHEST 1 VIEW COMPARISON:  11/24/2020 FINDINGS: Normal heart size. No pericardial effusion. Scratch set no pleural effusion or edema. No airspace opacities identified. Calcified granuloma is again noted within the lingula. The visualized osseous structures are unremarkable. IMPRESSION: No acute cardiopulmonary abnormalities. Electronically Signed   By: Kerby Moors M.D.   On: 05/21/2021 19:33   DG C-Arm 1-60 Min-No Report  Result Date: 05/23/2021 Fluoroscopy was utilized by the requesting physician.  No radiographic interpretation.   DG HIP OPERATIVE UNILAT W OR W/O PELVIS LEFT  Result Date: 05/23/2021 CLINICAL DATA:  Left hip surgery EXAM: OPERATIVE left HIP (WITH PELVIS IF PERFORMED) 2 VIEWS TECHNIQUE: Fluoroscopic spot image(s) were submitted for interpretation post-operatively. COMPARISON:  05/21/2021 FINDINGS: Two low resolution intraoperative spot views of the left hip. Total fluoroscopy time was 46 seconds. The images demonstrate 3 threaded screw fixation of the left femur for femoral neck fracture. IMPRESSION: Intraoperative fluoroscopic assistance provided during left hip surgery Electronically Signed   By: Donavan Foil M.D.   On: 05/23/2021 17:00   DG Hip Unilat W or Wo Pelvis 2-3 Views Left  Result Date: 05/21/2021 CLINICAL DATA:  Hip pain, fall 3 weeks ago EXAM: DG HIP (WITH OR WITHOUT PELVIS) 2-3V LEFT COMPARISON:  CT 11/24/2020 FINDINGS: Findings are suspicious for subacute nondisplaced left subcapital femoral neck  fracture. No femoral head dislocation. Mild degenerative changes of both hips. Pubic symphysis and rami are intact. IMPRESSION: Findings suspicious for subacute left subcapital femoral neck fracture Electronically Signed   By: Donavan Foil M.D.   On: 05/21/2021 18:48    Subjective: No acute issues or events overnight  Discharge Exam: Vitals:   05/26/21 2102 05/27/21 0412  BP: 132/80 139/78  Pulse: 78 (!) 49  Resp: 17 17  Temp: 98.6 F (37 C) 98.2 F (36.8 C)  SpO2: 97% 96%   Vitals:   05/26/21 0524 05/26/21 1406 05/26/21 2102 05/27/21 0412  BP: (!) 152/83 98/61 132/80 139/78  Pulse: (!) 58 60 78 (!) 49  Resp: 20 14 17 17   Temp: 97.9 F (36.6 C) 98.3 F (36.8 C) 98.6 F (37 C) 98.2 F (36.8 C)  TempSrc: Oral Oral Oral Oral  SpO2: 97% 98% 97% 96%  Weight:      Height:  General: Pt is alert, awake, not in acute distress Cardiovascular: RRR, S1/S2 +, no rubs, no gallops Respiratory: CTA bilaterally, no wheezing, no rhonchi Abdominal: Soft, NT, ND, bowel sounds + Extremities: no edema, no cyanosis    The results of significant diagnostics from this hospitalization (including imaging, microbiology, ancillary and laboratory) are listed below for reference.     Microbiology: Recent Results (from the past 240 hour(s))  Resp Panel by RT-PCR (Flu A&B, Covid) Nasopharyngeal Swab     Status: None   Collection Time: 05/21/21  8:07 PM   Specimen: Nasopharyngeal Swab; Nasopharyngeal(NP) swabs in vial transport medium  Result Value Ref Range Status   SARS Coronavirus 2 by RT PCR NEGATIVE NEGATIVE Final    Comment: (NOTE) SARS-CoV-2 target nucleic acids are NOT DETECTED.  The SARS-CoV-2 RNA is generally detectable in upper respiratory specimens during the acute phase of infection. The lowest concentration of SARS-CoV-2 viral copies this assay can detect is 138 copies/mL. A negative result does not preclude SARS-Cov-2 infection and should not be used as the sole basis for  treatment or other patient management decisions. A negative result may occur with  improper specimen collection/handling, submission of specimen other than nasopharyngeal swab, presence of viral mutation(s) within the areas targeted by this assay, and inadequate number of viral copies(<138 copies/mL). A negative result must be combined with clinical observations, patient history, and epidemiological information. The expected result is Negative.  Fact Sheet for Patients:  EntrepreneurPulse.com.au  Fact Sheet for Healthcare Providers:  IncredibleEmployment.be  This test is no t yet approved or cleared by the [place] FDA and  has been authorized for detection and/or diagnosis of SARS-CoV-2 by FDA under an Emergency Use Authorization (EUA). This EUA will remain  in effect (meaning this test can be used) for the duration of the COVID-19 declaration under Section 564(b)(1) of the Act, 21 U.S.C.section 360bbb-3(b)(1), unless the authorization is terminated  or revoked sooner.       Influenza A by PCR NEGATIVE NEGATIVE Final   Influenza B by PCR NEGATIVE NEGATIVE Final    Comment: (NOTE) The Xpert Xpress SARS-CoV-2/FLU/RSV plus assay is intended as an aid in the diagnosis of influenza from Nasopharyngeal swab specimens and should not be used as a sole basis for treatment. Nasal washings and aspirates are unacceptable for Xpert Xpress SARS-CoV-2/FLU/RSV testing.  Fact Sheet for Patients: EntrepreneurPulse.com.au  Fact Sheet for Healthcare Providers: IncredibleEmployment.be  This test is not yet approved or cleared by the [place] FDA and has been authorized for detection and/or diagnosis of SARS-CoV-2 by FDA under an Emergency Use Authorization (EUA). This EUA will remain in effect (meaning this test can be used) for the duration of the COVID-19 declaration under Section 564(b)(1) of the Act, 21  U.S.C. section 360bbb-3(b)(1), unless the authorization is terminated or revoked.  Performed at [facility], [address] [place], [place] [address]   Surgical pcr screen     Status: None   Collection Time: 05/22/21  3:44 PM   Specimen: Nasal Mucosa; Nasal Swab  Result Value Ref Range Status   MRSA, PCR NEGATIVE NEGATIVE Final   Staphylococcus aureus NEGATIVE NEGATIVE Final    Comment: (NOTE) The Xpert SA Assay (FDA approved for NASAL specimens in patients 27 years of age and older), is one component of a comprehensive surveillance program. It is not intended to diagnose infection nor to guide or monitor treatment. Performed at [facility], [place] [address], [place], [place] [address]  Labs: BNP (last 3 results) No results for input(s): BNP in the last 8760 hours. Basic Metabolic Panel: Recent Labs  Lab 05/21/21 2022 05/22/21 0538 05/23/21 0328 05/24/21 0331 05/25/21 0324  NA 134* 133* 133* 134* 137  K 3.6 3.6 3.8 3.9 4.0  CL 99 99 102 103 103  CO2 25 25 25 25 26   GLUCOSE 101* 94 95 163* 101*  BUN 18 16 18 19 22   CREATININE 1.12 1.17 1.16 1.28* 1.14  CALCIUM 8.5* 7.9* 8.1* 8.4* 8.4*   Liver Function Tests: Recent Labs  Lab 05/22/21 0538 05/23/21 0328  AST 15 13*  ALT 9 9  ALKPHOS 70 74  BILITOT 0.5 0.6  PROT 6.3* 6.3*  ALBUMIN 2.6* 2.6*   No results for input(s): LIPASE, AMYLASE in the last 168 hours. No results for input(s): AMMONIA in the last 168 hours. CBC: Recent Labs  Lab 05/21/21 2007 05/22/21 4970 05/23/21 0328 05/24/21 0331 05/25/21 0324 05/26/21 0321  WBC 11.2* 8.9 8.6 7.9 11.0* 12.1*  NEUTROABS 7.4  --   --   --   --   --   HGB 9.5* 8.2* 8.0* 8.0* 7.3* 7.6*  HCT 31.0* 25.5* 25.6* 26.1* 22.9* 25.1*  MCV 80.3 81.0 80.0 80.6 80.1 80.2  PLT 533* 429* 452* 497* 442* 511*   Cardiac Enzymes: No results for input(s): CKTOTAL, CKMB, CKMBINDEX, TROPONINI in the last 168 hours. BNP: Invalid  input(s): POCBNP CBG: Recent Labs  Lab 05/25/21 0843  GLUCAP 79   D-Dimer No results for input(s): DDIMER in the last 72 hours. Hgb A1c No results for input(s): HGBA1C in the last 72 hours. Lipid Profile No results for input(s): CHOL, HDL, LDLCALC, TRIG, CHOLHDL, LDLDIRECT in the last 72 hours. Thyroid function studies No results for input(s): TSH, T4TOTAL, T3FREE, THYROIDAB in the last 72 hours.  Invalid input(s): FREET3 Anemia work up No results for input(s): VITAMINB12, FOLATE, FERRITIN, TIBC, IRON, RETICCTPCT in the last 72 hours. Urinalysis    Component Value Date/Time   COLORURINE YELLOW 11/24/2020 0358   APPEARANCEUR CLEAR 11/24/2020 0358   LABSPEC 1.016 11/24/2020 0358   PHURINE 6.0 11/24/2020 0358   GLUCOSEU NEGATIVE 11/24/2020 0358   HGBUR NEGATIVE 11/24/2020 0358   BILIRUBINUR NEGATIVE 11/24/2020 0358   KETONESUR NEGATIVE 11/24/2020 0358   PROTEINUR NEGATIVE 11/24/2020 0358   UROBILINOGEN 0.2 04/21/2007 1451   NITRITE NEGATIVE 11/24/2020 0358   LEUKOCYTESUR NEGATIVE 11/24/2020 0358   Sepsis Labs Invalid input(s): PROCALCITONIN,  WBC,  LACTICIDVEN Microbiology Recent Results (from the past 240 hour(s))  Resp Panel by RT-PCR (Flu A&B, Covid) Nasopharyngeal Swab     Status: None   Collection Time: 05/21/21  8:07 PM   Specimen: Nasopharyngeal Swab; Nasopharyngeal(NP) swabs in vial transport medium  Result Value Ref Range Status   SARS Coronavirus 2 by RT PCR NEGATIVE NEGATIVE Final    Comment: (NOTE) SARS-CoV-2 target nucleic acids are NOT DETECTED.  The SARS-CoV-2 RNA is generally detectable in upper respiratory specimens during the acute phase of infection. The lowest concentration of SARS-CoV-2 viral copies this assay can detect is 138 copies/mL. A negative result does not preclude SARS-Cov-2 infection and should not be used as the sole basis for treatment or other patient management decisions. A negative result may occur with  improper specimen  collection/handling, submission of specimen other than nasopharyngeal swab, presence of viral mutation(s) within the areas targeted by this assay, and inadequate number of viral copies(<138 copies/mL). A negative result must be combined with clinical observations, patient history, and  epidemiological information. The expected result is Negative.  Fact Sheet for Patients:  EntrepreneurPulse.com.au  Fact Sheet for Healthcare Providers:  IncredibleEmployment.be  This test is no t yet approved or cleared by the [place] FDA and  has been authorized for detection and/or diagnosis of SARS-CoV-2 by FDA under an Emergency Use Authorization (EUA). This EUA will remain  in effect (meaning this test can be used) for the duration of the COVID-19 declaration under Section 564(b)(1) of the Act, 21 U.S.C.section 360bbb-3(b)(1), unless the authorization is terminated  or revoked sooner.       Influenza A by PCR NEGATIVE NEGATIVE Final   Influenza B by PCR NEGATIVE NEGATIVE Final    Comment: (NOTE) The Xpert Xpress SARS-CoV-2/FLU/RSV plus assay is intended as an aid in the diagnosis of influenza from Nasopharyngeal swab specimens and should not be used as a sole basis for treatment. Nasal washings and aspirates are unacceptable for Xpert Xpress SARS-CoV-2/FLU/RSV testing.  Fact Sheet for Patients: EntrepreneurPulse.com.au  Fact Sheet for Healthcare Providers: IncredibleEmployment.be  This test is not yet approved or cleared by the [place] FDA and has been authorized for detection and/or diagnosis of SARS-CoV-2 by FDA under an Emergency Use Authorization (EUA). This EUA will remain in effect (meaning this test can be used) for the duration of the COVID-19 declaration under Section 564(b)(1) of the Act, 21 U.S.C. section 360bbb-3(b)(1), unless the authorization is terminated or revoked.  Performed at [facility], [address] [place], [place] [address]   Surgical pcr screen     Status: None   Collection Time: 05/22/21  3:44 PM   Specimen: Nasal Mucosa; Nasal Swab  Result Value Ref Range Status   MRSA, PCR NEGATIVE NEGATIVE Final   Staphylococcus aureus NEGATIVE NEGATIVE Final    Comment: (NOTE) The Xpert SA Assay (FDA approved for NASAL specimens in patients 36 years of age and older), is one component of a comprehensive surveillance program. It is not intended to diagnose infection nor to guide or monitor treatment. Performed at [facility], [place] [address] [place],  [address]      Time coordinating discharge: Over 30 minutes  SIGNED:   Little Ishikawa, DO [facility] 05/27/2021, 7:52 AM Pager   If 7PM-7AM, please contact night-coverage [email or web address]

## 2021-05-29 ENCOUNTER — Ambulatory Visit (HOSPITAL_COMMUNITY): Admission: RE | Admit: 2021-05-29 | Payer: Medicare Other | Source: Ambulatory Visit

## 2021-05-29 ENCOUNTER — Encounter (HOSPITAL_COMMUNITY): Payer: Self-pay

## 2021-05-30 ENCOUNTER — Encounter: Payer: Self-pay | Admitting: Adult Health

## 2021-05-30 ENCOUNTER — Non-Acute Institutional Stay (SKILLED_NURSING_FACILITY): Payer: Medicare Other | Admitting: Adult Health

## 2021-05-30 DIAGNOSIS — N1832 Chronic kidney disease, stage 3b: Secondary | ICD-10-CM | POA: Diagnosis not present

## 2021-05-30 DIAGNOSIS — I10 Essential (primary) hypertension: Secondary | ICD-10-CM

## 2021-05-30 DIAGNOSIS — S42032S Displaced fracture of lateral end of left clavicle, sequela: Secondary | ICD-10-CM

## 2021-05-30 DIAGNOSIS — R339 Retention of urine, unspecified: Secondary | ICD-10-CM

## 2021-05-30 DIAGNOSIS — D500 Iron deficiency anemia secondary to blood loss (chronic): Secondary | ICD-10-CM | POA: Diagnosis not present

## 2021-05-30 DIAGNOSIS — U071 COVID-19: Secondary | ICD-10-CM | POA: Diagnosis not present

## 2021-05-30 DIAGNOSIS — K253 Acute gastric ulcer without hemorrhage or perforation: Secondary | ICD-10-CM

## 2021-05-30 DIAGNOSIS — L03114 Cellulitis of left upper limb: Secondary | ICD-10-CM | POA: Diagnosis not present

## 2021-05-30 DIAGNOSIS — S72002S Fracture of unspecified part of neck of left femur, sequela: Secondary | ICD-10-CM | POA: Diagnosis not present

## 2021-05-30 MED ORDER — CEPHALEXIN 500 MG PO CAPS: 500.0000 mg | ORAL_CAPSULE | Freq: Three times a day (TID) | ORAL | 0 refills | Status: AC

## 2021-05-30 NOTE — Progress Notes (Signed)
Location:  [facility] Room Number: 132 Place of Service:  SNF [phone]) Provider:  Royal Hawthorn NP   [name], MD  Patient Care Team: [name], MD as PCP - General (Internal Medicine)  Extended Emergency Contact Information Primary Emergency Contact: Rudean Hitt Address: [place], [place] [address] [name] of [place] Phone: [phone] Mobile Phone: [phone] Relation: Daughter Secondary Emergency Contact: Simms,Carol Address: [address]          [place], [place] [address] [place] Phone: [phone] Work Phone: [phone] Relation: Other  Code Status:  DNR Goals of care: Advanced Directive information Advanced Directives 05/22/2021  Does Patient Have a Medical Advance Directive? -  Type of Advance Directive -  Does patient want to make changes to medical advance directive? No - Patient declined  Copy of [place] in Chart? -  Would patient like information on creating a medical advance directive? -  Pre-existing out of facility DNR order (yellow form or pink MOST form) -     Chief Complaint  Patient presents with   Hospitalization Follow-up    HPI:  Pt is a 87 y.o. male seen today for an acute visit for hospital follow up.  Patient sustained a fall from his wheel chair 9/28. Left shoulder X-ray 09/29 showed mild displaced acute fracture of distal clavicle. Orders for sling initiated and referral sent to ortho During that visit on patient also complained of left hip pain with movement. CT of hip completed by ortho 10/4 and findings were suspicious of left subcapital femoral neck fracture. Patient admitted to hospital 10/4 and status post  ORIF of left hip 05/23/2021.   Patient tested negative for COVID 10/4. Repeat PCR covid test on 10/10 positive x2. No covid symptoms.  Today patient denied any clavicle pain or pain at left hip  surgical site. He did report discomfort in his left hand that was warm and tender to touch.   Anemia- history of anemia due to GI bleed. Remains on Iron   Has history of cameron ulcer is followed by Dr. Rosendo Gros and G tube remains in place to prevent hiatal hernia.    Past Medical History:  Diagnosis Date   Acute kidney injury ([place]) 03/06/14   Anemia, unspecified 03/06/14   Arthritis    BPH (benign prostatic hyperplasia)    Cerebral atrophy ([place]) 03/06/14   Cerebrovascular disease 03/06/14   Degenerative disc disease, lumbar 03/06/14   Diverticula, bladder acquired    Edema 03/06/14   GERD (gastroesophageal reflux disease)    H/O: GI bleed 1986   Hiatal hernia    History of fall 03/06/14   Hypercalcemia 03/06/14   Hyperlipidemia    Hypertension    Impotence    LFT elevation 03/06/14   Lumbago 03/06/14   Mallory-Weiss tear 1986   Osteoporosis    Rhabdomyolysis 03/06/14   Scoliosis of cervical spine 03/06/14   Scoliosis of lumbar spine 03/06/14   Shingles    Spermatocele    bilateral, left greater than right   Troponin level elevated 03/06/14   Weak 03/06/14   Past Surgical History:  Procedure Laterality Date   BACK SURGERY  10/2009   ESI for surgery for lumbar spinal stenosis   BIOPSY  11/24/2020   Procedure: BIOPSY;  Surgeon: Milus Banister, MD;  Location: [facility];  Service: Endoscopy;;   clubbed toe repair  2004   COLONOSCOPY  08/15/2011   ESOPHAGOGASTRODUODENOSCOPY (EGD) WITH PROPOFOL N/A 11/24/2020   Procedure: ESOPHAGOGASTRODUODENOSCOPY (EGD) WITH PROPOFOL;  Surgeon: Milus Banister, MD;  Location: [facility];  Service: Endoscopy;  Laterality: N/A;   HIATAL HERNIA REPAIR N/A 11/28/2020   Procedure: Gastrostomy tube placement;  Surgeon: Ralene Ok, MD;  Location: [facility];  Service: General;  Laterality: N/A;  [address] [place] Left 05/23/2021   Procedure: CANNULATED HIP PINNING;  Surgeon: Rod Can, MD;  Location: [facility];  Service: Orthopedics;   Laterality: Left;  hanna large carm   INGUINAL HERNIA REPAIR Right 11/26/2004   Dr Rebekah [place] - open w mesh   REPLACEMENT TOTAL KNEE BILATERAL  2007, 2008   TONSILLECTOMY AND ADENOIDECTOMY  1939   X-STOP IMPLANTATION      Allergies  Allergen Reactions   Amrix [Cyclobenzaprine]    Shellfish Allergy     Not documented on the [facility]    Outpatient Encounter Medications as of 05/30/2021  Medication Sig   acetaminophen (TYLENOL) 500 MG tablet Take 1,000 mg by mouth every 8 (eight) hours.   antiseptic oral rinse (BIOTENE) LIQD 15 mLs by Mouth Rinse route 4 (four) times daily as needed for dry mouth.   bisacodyl (DULCOLAX) 10 MG suppository Place 10 mg rectally as needed for moderate constipation.   cephALEXin (KEFLEX) 500 MG capsule Take 1 capsule (500 mg total) by mouth 3 (three) times daily for 7 days.   cholestyramine (QUESTRAN) 4 g packet Take 1 packet (4 g total) by mouth 2 (two) times daily.   iron polysaccharides (NIFEREX) 150 MG capsule Take 150 mg by mouth See admin instructions. Give 150mg  capsule by mouth once a morning with meal and 2 hours separate from Questran   levothyroxine (SYNTHROID) 50 MCG tablet Take 50 mcg by mouth daily before breakfast.   loperamide (IMODIUM) 2 MG capsule Take 2 mg by mouth every 8 (eight) hours as needed for diarrhea or loose stools.   Melatonin 5 MG CAPS Take 1 capsule (5 mg total) by mouth at bedtime.   ondansetron (ZOFRAN) 4 MG tablet Take 4 mg by mouth every 6 (six) hours as needed for nausea or vomiting.   pantoprazole (PROTONIX) 40 MG tablet Take 40 mg by mouth daily.   Polyethyl Glycol-Propyl Glycol (SYSTANE) 0.4-0.3 % SOLN Two drops both eyes three times daily as needed for dry itchy eyes.   saccharomyces boulardii (FLORASTOR) 250 MG capsule Take 250 mg by mouth 2 (two) times daily.   tamsulosin (FLOMAX) 0.4 MG CAPS capsule Take 1 capsule (0.4 mg total) by mouth daily.   traMADol (ULTRAM) 50 MG tablet Take 0.5-1 tablets (25-50 mg total) by mouth every  8 (eight) hours as needed for up to 5 days. 25 mg for mild pain, 50 mg for severe pain (Patient taking differently: Take 25-50 mg by mouth every 6 (six) hours as needed (for mild to moderate pain).)   triamcinolone cream (KENALOG) 0.1 % Apply 1 application topically to back daily as needed for itching or rash   lactose free nutrition (BOOST) LIQD Take 237 mLs by mouth daily as needed (as residents request). (Patient not taking: Reported on 05/30/2021)   No facility-administered encounter medications on file as of 05/30/2021.    Review of Systems  Constitutional:  Negative for activity change, appetite change, chills, diaphoresis, fatigue, fever and unexpected weight change.  HENT:  Negative for congestion, rhinorrhea, sore throat and trouble swallowing.   Respiratory:  Negative for cough, shortness  of breath, wheezing and stridor.   Cardiovascular:  Negative for chest pain, palpitations and leg swelling.  Gastrointestinal:  Positive for constipation (x 2 days). Negative for abdominal distention, abdominal pain, diarrhea, nausea and vomiting.  Genitourinary:  Negative for difficulty urinating and dysuria.       Has foley  Musculoskeletal:  Positive for arthralgias and gait problem. Negative for back pain, joint swelling and myalgias.       Left hand swelling with pain and redness.   Skin:  Positive for color change.  Neurological:  Negative for dizziness, seizures, syncope, facial asymmetry, speech difficulty, weakness and headaches.  Hematological:  Negative for adenopathy. Does not bruise/bleed easily.  Psychiatric/Behavioral:  Positive for confusion. Negative for agitation and behavioral problems.    Immunization History  Administered Date(s) Administered   Influenza, High Dose Seasonal PF 06/08/2020   Moderna Sars-Covid-2 Vaccination 09/09/2019, 09/27/2019   Td 09/02/2007   Tdap 11/11/2017, 04/04/2021   Pertinent  Health Maintenance Due  Topic Date Due   DEXA SCAN  04/30/2022  (Originally 12/30/2014)   INFLUENZA VACCINE  Discontinued   Fall Risk  02/29/2020 10/19/2019 06/13/2019 03/16/2019 05/12/2018  Falls in the past year? 0 0 0 0 No  Number falls in past yr: 0 0 0 0 -  Injury with Fall? 0 0 0 0 -  Risk for fall due to : - - Impaired balance/gait - -  Follow up - - Falls evaluation completed - -   Functional Status Survey:    Vitals:   05/30/21 1222  BP: 118/65  Pulse: 60  Temp: 97.9 F (36.6 C)  SpO2: 92%   There is no height or weight on file to calculate BMI. Physical Exam Vitals and nursing note reviewed.  Constitutional:      General: He is not in acute distress.    Appearance: He is not ill-appearing or diaphoretic.  HENT:     Head: Normocephalic and atraumatic.  Neck:     Thyroid: No thyromegaly.     Vascular: No JVD.     Trachea: No tracheal deviation.  Cardiovascular:     Rate and Rhythm: Normal rate and regular rhythm.     Heart sounds: No murmur heard. Pulmonary:     Effort: Pulmonary effort is normal. No respiratory distress.     Breath sounds: Normal breath sounds. No wheezing.  Abdominal:     General: Abdomen is flat. Bowel sounds are normal. There is no distension.     Palpations: Abdomen is soft.     Tenderness: There is no abdominal tenderness.  Musculoskeletal:     Right shoulder: Normal.     Left shoulder: No swelling or tenderness. Normal range of motion. Normal pulse.     Left hand: Swelling and tenderness present. Decreased strength. Normal sensation. Normal capillary refill. Normal pulse.     Cervical back: Normal range of motion and neck supple.     Left hip: No tenderness. Normal range of motion.  Lymphadenopathy:     Cervical: No cervical adenopathy.  Skin:    General: Skin is warm and dry.     Findings: Erythema present.     Comments: Left hand   Neurological:     Mental Status: He is alert and oriented to person, place, and time.     Cranial Nerves: No cranial nerve deficit.     Gait: Gait abnormal.     Labs reviewed: Recent Labs    11/26/20 0332 11/27/20 0327 11/28/20 0340 11/29/20 0338 12/06/20  0000 05/23/21 0328 05/24/21 0331 05/25/21 0324  NA 134* 134* 132* 136   < > 133* 134* 137  K 3.9 4.0 3.9 4.3   < > 3.8 3.9 4.0  CL 109 106 102 105   < > 102 103 103  CO2 19* 21* 20* 22   < > 25 25 26   GLUCOSE 103* 97 98 132*   < > 95 163* 101*  BUN 24* 21 17 20    < > 18 19 22   CREATININE 1.38* 1.29* 1.49* 1.49*   < > 1.16 1.28* 1.14  CALCIUM 7.7* 7.9* 7.7* 8.1*   < > 8.1* 8.4* 8.4*  MG 1.7 2.0 1.8 2.0  --   --   --   --   PHOS 2.6 3.3 3.0  --   --   --   --   --    < > = values in this interval not displayed.   Recent Labs    11/29/20 0338 12/06/20 0000 04/08/21 0000 05/22/21 0538 05/23/21 0328  AST 16   < > 16 15 13*  ALT 12   < > 7* 9 9  ALKPHOS 68   < > 75 70 74  BILITOT 0.7  --   --  0.5 0.6  PROT 6.4*  --   --  6.3* 6.3*  ALBUMIN 2.7*   < > 3.0* 2.6* 2.6*   < > = values in this interval not displayed.   Recent Labs    11/27/20 0327 11/28/20 0340 11/29/20 0338 05/21/21 2007 05/22/21 0538 05/24/21 0331 05/25/21 0324 05/26/21 0321  WBC 8.4 9.2   < > 11.2*   < > 7.9 11.0* 12.1*  NEUTROABS 5.1 5.8  --  7.4  --   --   --   --   HGB 7.6*  8.4* 8.4*   < > 9.5*   < > 8.0* 7.3* 7.6*  HCT 23.8*  26.1* 26.9*   < > 31.0*   < > 26.1* 22.9* 25.1*  MCV 91.3 93.4   < > 80.3   < > 80.6 80.1 80.2  PLT 265 284   < > 533*   < > 497* 442* 511*   < > = values in this interval not displayed.   Lab Results  Component Value Date   TSH 5.07 04/08/2021   Lab Results  Component Value Date   HGBA1C 5.6 11/26/2020   No results found for: CHOL, HDL, LDLCALC, LDLDIRECT, TRIG, CHOLHDL  Significant Diagnostic Results in last 30 days:  CT PELVIS WO CONTRAST  Result Date: 05/27/2021 CLINICAL DATA:  Pelvic trauma, left hip pinning EXAM: CT PELVIS WITHOUT CONTRAST TECHNIQUE: Multidetector CT imaging of the pelvis was performed following the standard protocol without intravenous  contrast. COMPARISON:  05/23/2021, 05/27/2021, 05/22/2021 FINDINGS: Urinary Tract: Visualized portions of the kidneys and ureters are unremarkable. Bladder is decompressed with a Foley catheter in place. Diffuse bladder wall thickening could be sequela of chronic bladder outlet obstruction. Bowel: There is a large amount of retained stool within the colon, with likely impacted stool within the rectal vault. No bowel obstruction. Vascular/Lymphatic: Extensive atherosclerosis. No pathologic adenopathy. Reproductive:  The prostate is not enlarged. Other: No free fluid or free gas. Indeterminate hyperdense 2.3 x 1.9 cm cutaneous mass in the right inguinal region reference image 129/10. Recommend direct visual inspection. Musculoskeletal: Postsurgical changes are seen from pinning of a subcapital left femoral neck fracture, without significant change since prior studies. The oblique lucency seen within the  intertrochanteric region on the recent x-ray is not identified on the CT images, and may have reflected an overlying skin fold. There are no other acute displaced fractures. Severe spondylosis and facet hypertrophy noted at the lumbosacral junction. Reconstructed images confirm the above findings. IMPRESSION: 1. Subcapital left femoral neck fracture status post pinning. 2. The oblique lucency seen within the intertrochanteric region on recent x-ray likely represented a skin fold. No corresponding abnormality by CT. 3. Indeterminate cutaneous 2.3 cm mass in the right inguinal region. Recommend direct visual inspection. 4. Likely fecal impaction. Electronically Signed   By: Randa Ngo M.D.   On: 05/27/2021 20:41   DG Pelvis Portable  Result Date: 05/27/2021 CLINICAL DATA:  Unwitnessed fall.  Recent left hip pinning. EXAM: PORTABLE PELVIS 1-2 VIEWS COMPARISON:  05/21/2021 FINDINGS: Pins are seen within the left femoral neck region across the subcapital femoral neck fracture seen on prior imaging. New lucency is  seen in the inter trochanteric/subtrochanteric region from the pins extending medially inferior to the lesser trochanter. Cannot exclude nondisplaced fracture. IMPRESSION: Previous pinning of the previously seen subcapital femoral neck fracture. New lucency in the intertrochanteric/subtrochanteric region described as above concerning for nondisplaced fracture. Electronically Signed   By: Rolm Baptise M.D.   On: 05/27/2021 18:33   Pelvis Portable  Result Date: 05/23/2021 CLINICAL DATA:  Status post ORIF of left hip fracture EXAM: PORTABLE PELVIS 1-2 VIEWS COMPARISON:  Intraoperative films from earlier in the same day. FINDINGS: Three compression screws are noted traversing the femoral neck on the left into the femoral head. The overall appearance is similar to that seen on the intraoperative films. No acute bony abnormality is noted. IMPRESSION: Status post ORIF of left femoral neck fracture. Electronically Signed   By: Inez Catalina M.D.   On: 05/23/2021 19:46   CT HIP LEFT WO CONTRAST  Result Date: 05/22/2021 CLINICAL DATA:  Hip trauma, fracture suspected. Patient fell 3 weeks ago and is unable to bear weight. EXAM: CT OF THE LEFT HIP WITHOUT CONTRAST TECHNIQUE: Multidetector CT imaging of the left hip was performed according to the standard protocol. Multiplanar CT image reconstructions were also generated. COMPARISON:  Radiographs 05/21/2021 and pelvic CT 11/24/2020. FINDINGS: Bones/Joint/Cartilage The bones appear adequately mineralized. The femoral head is located. There is a minimally displaced subcapital fracture of the left femoral neck with minimal valgus angulation. This fracture demonstrates no intertrochanteric extension. There are underlying moderate left hip degenerative changes with a small joint effusion. The visualized left hemipelvis is intact. Ligaments Suboptimally assessed by CT. Muscles and Tendons Mild generalized muscular atrophy without focal intramuscular hematoma. Soft tissues  Iliofemoral atherosclerosis. No significant periarticular hematoma. Prominent stool in the rectum noted. IMPRESSION: CT confirms the presence of a mildly displaced and angulated subcapital fracture of the left femoral neck. Underlying moderate left hip degenerative changes. Electronically Signed   By: Richardean Sale M.D.   On: 05/22/2021 14:50   DG Chest Port 1 View  Result Date: 05/21/2021 CLINICAL DATA:  Preop.  Left hip fracture. EXAM: PORTABLE CHEST 1 VIEW COMPARISON:  11/24/2020 FINDINGS: Normal heart size. No pericardial effusion. Scratch set no pleural effusion or edema. No airspace opacities identified. Calcified granuloma is again noted within the lingula. The visualized osseous structures are unremarkable. IMPRESSION: No acute cardiopulmonary abnormalities. Electronically Signed   By: Kerby Moors M.D.   On: 05/21/2021 19:33   DG C-Arm 1-60 Min-No Report  Result Date: 05/23/2021 Fluoroscopy was utilized by the requesting physician.  No radiographic interpretation.  DG HIP OPERATIVE UNILAT W OR W/O PELVIS LEFT  Result Date: 05/23/2021 CLINICAL DATA:  Left hip surgery EXAM: OPERATIVE left HIP (WITH PELVIS IF PERFORMED) 2 VIEWS TECHNIQUE: Fluoroscopic spot image(s) were submitted for interpretation post-operatively. COMPARISON:  05/21/2021 FINDINGS: Two low resolution intraoperative spot views of the left hip. Total fluoroscopy time was 46 seconds. The images demonstrate 3 threaded screw fixation of the left femur for femoral neck fracture. IMPRESSION: Intraoperative fluoroscopic assistance provided during left hip surgery Electronically Signed   By: Donavan Foil M.D.   On: 05/23/2021 17:00   DG Hip Unilat W or Wo Pelvis 2-3 Views Left  Result Date: 05/21/2021 CLINICAL DATA:  Hip pain, fall 3 weeks ago EXAM: DG HIP (WITH OR WITHOUT PELVIS) 2-3V LEFT COMPARISON:  CT 11/24/2020 FINDINGS: Findings are suspicious for subacute nondisplaced left subcapital femoral neck fracture. No femoral head  dislocation. Mild degenerative changes of both hips. Pubic symphysis and rami are intact. IMPRESSION: Findings suspicious for subacute left subcapital femoral neck fracture Electronically Signed   By: Donavan Foil M.D.   On: 05/21/2021 18:48    Assessment/Plan  1. Cellulitis of left hand Start Keflex 500 mg TID for 7 days Order for X-ray of left hand to r/o fracture  Apply ice/elevation  2. Iron deficiency anemia due to chronic blood loss Lab Results  Component Value Date   HGB 7.6 (L) 05/26/2021  Has chronic anemia but is worse after surgery   Continue Niferex Repeat CBC ordered in one week  3. Closed displaced fracture of acromial end of left clavicle, sequela Denies current pain. Continue Tylenol and ultram for pain  Continue sling   4. Closed fracture of left hip, sequela S/p ORIF 10/06. Denies any pain.   Continue tylenol  PT and OT when able.   5. COVID-19 virus infection Asymptomatic Maintain [facility] covid precaution x 10 days   6. Cameron ulcer, acute with bleeding & anemia S/p surgery with gastric reduction and gastropexy. Continues protonix    7. Primary hypertension Controlled. Not on meds   8. Stage 3b chronic kidney disease ([place]) Creatinine stable at 1.14 (05/25/21) Continue to periodically monitor BMP and avoid nephrotoxic agents  9. Urinary retention Continue flomax  Foley remains in place     Family/ staff Communication: communication with patient and nurse   Labs/tests ordered:  repeat CBC in one week, xray left hand

## 2021-05-31 DIAGNOSIS — M79642 Pain in left hand: Secondary | ICD-10-CM | POA: Diagnosis not present

## 2021-05-31 DIAGNOSIS — M25532 Pain in left wrist: Secondary | ICD-10-CM | POA: Diagnosis not present

## 2021-06-03 ENCOUNTER — Encounter: Payer: Self-pay | Admitting: Internal Medicine

## 2021-06-03 ENCOUNTER — Non-Acute Institutional Stay (SKILLED_NURSING_FACILITY): Payer: Medicare Other | Admitting: Internal Medicine

## 2021-06-03 DIAGNOSIS — N1832 Chronic kidney disease, stage 3b: Secondary | ICD-10-CM | POA: Diagnosis not present

## 2021-06-03 DIAGNOSIS — R339 Retention of urine, unspecified: Secondary | ICD-10-CM

## 2021-06-03 DIAGNOSIS — Z8781 Personal history of (healed) traumatic fracture: Secondary | ICD-10-CM

## 2021-06-03 DIAGNOSIS — U071 COVID-19: Secondary | ICD-10-CM

## 2021-06-03 DIAGNOSIS — D500 Iron deficiency anemia secondary to blood loss (chronic): Secondary | ICD-10-CM | POA: Diagnosis not present

## 2021-06-03 DIAGNOSIS — L03114 Cellulitis of left upper limb: Secondary | ICD-10-CM | POA: Diagnosis not present

## 2021-06-03 DIAGNOSIS — Z9889 Other specified postprocedural states: Secondary | ICD-10-CM

## 2021-06-03 DIAGNOSIS — I10 Essential (primary) hypertension: Secondary | ICD-10-CM | POA: Diagnosis not present

## 2021-06-03 NOTE — Progress Notes (Signed)
Location:   [facility] Room Number: 132 Place of Service:  SNF [phone]) Provider:  Veleta Miners MD  [name], MD  Patient Care Team: [name], MD as PCP - General (Internal Medicine)  Extended Emergency Contact Information Primary Emergency Contact: Rudean Hitt Address: [place], [place] [address] [name] Phone: [phone] Mobile Phone: [phone] Relation: Daughter Secondary Emergency Contact: Simms,Carol Address: [address]          [place], [place] [address] [place] Phone: [phone] Work Phone: [phone] Relation: Other  Code Status:  DNR Goals of care: Advanced Directive information Advanced Directives 06/03/2021  Does Patient Have a Medical Advance Directive? Yes  Type of Paramedic of [place];Living will;Out of facility DNR (pink MOST or yellow form)  Does patient want to make changes to medical advance directive? No - Patient declined  Copy of [place] in Chart? Yes - validated most recent copy scanned in chart (See row information)  Would patient like information on creating a medical advance directive? -  Pre-existing out of facility DNR order (yellow form or pink MOST form) Yellow form placed in chart (order not valid for inpatient use);Pink MOST form placed in chart (order not valid for inpatient use)     Chief Complaint  Patient presents with   [facility] follow up    HPI:  Pt is a 87 y.o. male seen today for an acute visit for Readmit in the hospital  Admitted to the hospital from 10/04-10/10 for Left Hip Fracture Underwent ORIF on 10/06  Patient has h/o Anemia due to GI bleed on Iron S/p Gastropexy and Gastric reduction for Large Hiatal Hernia and now has G Tube since 4/22 Per Surgery it needs to stay in  H/o Urinary Retention Bilateral Hydronephrosis  now has  Foley Catheter  He fell on 9/28 Was send to the hospital for left hip Pain Was found to have Subcapital left femoral neck fracture Underwent above procedure Post op complicated by Worsening Anemia .  Also he was covid Positive Asymptomatic. Now in isolation in SNF His only complain was that he wants to start walking with his walker Unfor unfortunately not getting enough therapy due to isolation. Patient denies any pain.  Mental status at baseline.  No other issues   Past Medical History:  Diagnosis Date   Acute kidney injury ([place]) 03/06/14   Anemia, unspecified 03/06/14   Arthritis    BPH (benign prostatic hyperplasia)    Cerebral atrophy ([place]) 03/06/14   Cerebrovascular disease 03/06/14   Degenerative disc disease, lumbar 03/06/14   Diverticula, bladder acquired    Edema 03/06/14   GERD (gastroesophageal reflux disease)    H/O: GI bleed 1986   Hiatal hernia    History of fall 03/06/14   Hypercalcemia 03/06/14   Hyperlipidemia    Hypertension    Impotence    LFT elevation 03/06/14   Lumbago 03/06/14   Mallory-Weiss tear 1986   Osteoporosis    Rhabdomyolysis 03/06/14   Scoliosis of cervical spine 03/06/14   Scoliosis of lumbar spine 03/06/14   Shingles    Spermatocele    bilateral, left greater than right   Troponin level elevated 03/06/14   Weak 03/06/14   Past Surgical History:  Procedure Laterality Date   BACK SURGERY  10/2009   ESI for surgery  for lumbar spinal stenosis   BIOPSY  11/24/2020   Procedure: BIOPSY;  Surgeon: Milus Banister, MD;  Location: [facility];  Service: Endoscopy;;   clubbed toe repair  2004   COLONOSCOPY  08/15/2011   ESOPHAGOGASTRODUODENOSCOPY (EGD) WITH PROPOFOL N/A 11/24/2020   Procedure: ESOPHAGOGASTRODUODENOSCOPY (EGD) WITH PROPOFOL;  Surgeon: Milus Banister, MD;  Location: [facility];  Service: Endoscopy;  Laterality: N/A;   HIATAL HERNIA REPAIR N/A 11/28/2020   Procedure: Gastrostomy tube placement;  Surgeon: Ralene Ok, MD;  Location:  [facility] ORS;  Service: General;  Laterality: N/A;  [address] [place] Left 05/23/2021   Procedure: CANNULATED HIP PINNING;  Surgeon: Rod Can, MD;  Location: [facility] ORS;  Service: Orthopedics;  Laterality: Left;  hanna large carm   INGUINAL HERNIA REPAIR Right 11/26/2004   Dr Rebekah [place] - open w mesh   REPLACEMENT TOTAL KNEE BILATERAL  2007, 2008   TONSILLECTOMY AND ADENOIDECTOMY  1939   X-STOP IMPLANTATION      Allergies  Allergen Reactions   Amrix [Cyclobenzaprine]    Shellfish Allergy     Not documented on the [facility]    Allergies as of 06/03/2021       Reactions   Amrix [cyclobenzaprine]    Shellfish Allergy    Not documented on the [facility]        Medication List        Accurate as of June 03, 2021 10:15 AM. If you have any questions, ask your nurse or doctor.          STOP taking these medications    traMADol 50 MG tablet Commonly known as: ULTRAM Stopped by: [name], MD       TAKE these medications    acetaminophen 500 MG tablet Commonly known as: TYLENOL Take 1,000 mg by mouth every 8 (eight) hours.   albuterol 108 (90 Base) MCG/ACT inhaler Commonly known as: VENTOLIN HFA Inhale into the lungs every 6 (six) hours as needed for wheezing or shortness of breath.   antiseptic oral rinse Liqd 15 mLs by Mouth Rinse route 4 (four) times daily as needed for dry mouth.   aspirin EC 81 MG tablet Take 81 mg by mouth 2 (two) times daily. Swallow whole.   bisacodyl 10 MG suppository Commonly known as: DULCOLAX Place 10 mg rectally as needed for moderate constipation.   cephALEXin 500 MG capsule Commonly known as: KEFLEX Take 1 capsule (500 mg total) by mouth 3 (three) times daily for 7 days.   cholestyramine 4 g packet Commonly known as: Questran Take 1 packet (4 g total) by mouth 2 (two) times daily.   guaiFENesin 100 MG/5ML liquid Commonly known as: ROBITUSSIN Take 100 mg by mouth every 6 (six) hours as needed for cough.   iron  polysaccharides 150 MG capsule Commonly known as: NIFEREX Take 150 mg by mouth See admin instructions. Give 150mg  capsule by mouth once a morning with meal and 2 hours separate from Questran   lactose free nutrition Liqd Take 237 mLs by mouth daily as needed (as residents request).   levothyroxine 50 MCG tablet Commonly known as: SYNTHROID Take 50 mcg by mouth daily before breakfast.   loperamide 2 MG capsule Commonly known as: IMODIUM Take 2 mg by mouth every 8 (eight) hours as needed for diarrhea or loose stools.   Melatonin 5 MG Caps Take 1 capsule (5 mg total) by mouth at bedtime.   ondansetron 4 MG tablet Commonly known as: ZOFRAN Take 4  mg by mouth every 6 (six) hours as needed for nausea or vomiting.   pantoprazole 40 MG tablet Commonly known as: PROTONIX Take 40 mg by mouth daily.   saccharomyces boulardii 250 MG capsule Commonly known as: FLORASTOR Take 250 mg by mouth 2 (two) times daily.   Systane 0.4-0.3 % Soln Generic drug: Polyethyl Glycol-Propyl Glycol Two drops both eyes three times daily as needed for dry itchy eyes.   tamsulosin 0.4 MG Caps capsule Commonly known as: FLOMAX Take 1 capsule (0.4 mg total) by mouth daily.   triamcinolone cream 0.1 % Commonly known as: KENALOG Apply 1 application topically to back daily as needed for itching or rash        Review of Systems  Constitutional: Negative.   HENT: Negative.    Respiratory: Negative.    Cardiovascular: Negative.   Gastrointestinal: Negative.   Genitourinary: Negative.   Musculoskeletal:  Positive for gait problem.  Skin: Negative.   Neurological:  Positive for weakness.  Psychiatric/Behavioral:  Positive for confusion.    Immunization History  Administered Date(s) Administered   Influenza, High Dose Seasonal PF 06/08/2020   Moderna Sars-Covid-2 Vaccination 09/09/2019, 09/27/2019   Td 09/02/2007   Tdap 11/11/2017, 04/04/2021   Pertinent  Health Maintenance Due  Topic Date Due    DEXA SCAN  04/30/2022 (Originally 12/30/2014)   INFLUENZA VACCINE  Discontinued   Fall Risk  02/29/2020 10/19/2019 06/13/2019 03/16/2019 05/12/2018  Falls in the past year? 0 0 0 0 No  Number falls in past yr: 0 0 0 0 -  Injury with Fall? 0 0 0 0 -  Risk for fall due to : - - Impaired balance/gait - -  Follow up - - Falls evaluation completed - -   Functional Status Survey:    Vitals:   06/03/21 1002  BP: 125/83  Pulse: 65  Resp: 18  Temp: 98.3 F (36.8 C)  SpO2: 97%  Weight: 128 lb 6.4 oz (58.2 kg)  Height: 5\' 4"  (1.626 m)   Body mass index is 22.04 kg/m. Physical Exam Vitals reviewed.  Constitutional:      Appearance: Normal appearance.  HENT:     Head: Normocephalic.     Nose: Nose normal.     Mouth/Throat:     Mouth: Mucous membranes are moist.     Pharynx: Oropharynx is clear.  Eyes:     Pupils: Pupils are equal, round, and reactive to light.  Cardiovascular:     Rate and Rhythm: Normal rate and regular rhythm.     Pulses: Normal pulses.  Pulmonary:     Effort: Pulmonary effort is normal. No respiratory distress.     Breath sounds: Normal breath sounds.  Abdominal:     General: Abdomen is flat. Bowel sounds are normal.     Palpations: Abdomen is soft.  Musculoskeletal:        General: No swelling.     Cervical back: Neck supple.  Skin:    General: Skin is warm and dry.  Neurological:     General: No focal deficit present.     Mental Status: He is alert.  Psychiatric:        Mood and Affect: Mood normal.        Thought Content: Thought content normal.    Labs reviewed: Recent Labs    11/26/20 0332 11/27/20 0327 11/28/20 0340 11/29/20 0338 12/06/20 0000 05/23/21 0328 05/24/21 0331 05/25/21 0324  NA 134* 134* 132* 136   < > 133* 134* 137  K 3.9 4.0 3.9 4.3   < > 3.8 3.9 4.0  CL 109 106 102 105   < > 102 103 103  CO2 19* 21* 20* 22   < > 25 25 26   GLUCOSE 103* 97 98 132*   < > 95 163* 101*  BUN 24* 21 17 20    < > 18 19 22   CREATININE 1.38* 1.29*  1.49* 1.49*   < > 1.16 1.28* 1.14  CALCIUM 7.7* 7.9* 7.7* 8.1*   < > 8.1* 8.4* 8.4*  MG 1.7 2.0 1.8 2.0  --   --   --   --   PHOS 2.6 3.3 3.0  --   --   --   --   --    < > = values in this interval not displayed.   Recent Labs    11/29/20 0338 12/06/20 0000 04/08/21 0000 05/22/21 0538 05/23/21 0328  AST 16   < > 16 15 13*  ALT 12   < > 7* 9 9  ALKPHOS 68   < > 75 70 74  BILITOT 0.7  --   --  0.5 0.6  PROT 6.4*  --   --  6.3* 6.3*  ALBUMIN 2.7*   < > 3.0* 2.6* 2.6*   < > = values in this interval not displayed.   Recent Labs    11/27/20 0327 11/28/20 0340 11/29/20 0338 05/21/21 2007 05/22/21 0538 05/24/21 0331 05/25/21 0324 05/26/21 0321  WBC 8.4 9.2   < > 11.2*   < > 7.9 11.0* 12.1*  NEUTROABS 5.1 5.8  --  7.4  --   --   --   --   HGB 7.6*  8.4* 8.4*   < > 9.5*   < > 8.0* 7.3* 7.6*  HCT 23.8*  26.1* 26.9*   < > 31.0*   < > 26.1* 22.9* 25.1*  MCV 91.3 93.4   < > 80.3   < > 80.6 80.1 80.2  PLT 265 284   < > 533*   < > 497* 442* 511*   < > = values in this interval not displayed.   Lab Results  Component Value Date   TSH 5.07 04/08/2021   Lab Results  Component Value Date   HGBA1C 5.6 11/26/2020   No results found for: CHOL, HDL, LDLCALC, LDLDIRECT, TRIG, CHOLHDL  Significant Diagnostic Results in last 30 days:  CT PELVIS WO CONTRAST  Result Date: 05/27/2021 CLINICAL DATA:  Pelvic trauma, left hip pinning EXAM: CT PELVIS WITHOUT CONTRAST TECHNIQUE: Multidetector CT imaging of the pelvis was performed following the standard protocol without intravenous contrast. COMPARISON:  05/23/2021, 05/27/2021, 05/22/2021 FINDINGS: Urinary Tract: Visualized portions of the kidneys and ureters are unremarkable. Bladder is decompressed with a Foley catheter in place. Diffuse bladder wall thickening could be sequela of chronic bladder outlet obstruction. Bowel: There is a large amount of retained stool within the colon, with likely impacted stool within the rectal vault. No bowel  obstruction. Vascular/Lymphatic: Extensive atherosclerosis. No pathologic adenopathy. Reproductive:  The prostate is not enlarged. Other: No free fluid or free gas. Indeterminate hyperdense 2.3 x 1.9 cm cutaneous mass in the right inguinal region reference image 129/10. Recommend direct visual inspection. Musculoskeletal: Postsurgical changes are seen from pinning of a subcapital left femoral neck fracture, without significant change since prior studies. The oblique lucency seen within the intertrochanteric region on the recent x-ray is not identified on the CT images, and may have reflected an overlying skin  fold. There are no other acute displaced fractures. Severe spondylosis and facet hypertrophy noted at the lumbosacral junction. Reconstructed images confirm the above findings. IMPRESSION: 1. Subcapital left femoral neck fracture status post pinning. 2. The oblique lucency seen within the intertrochanteric region on recent x-ray likely represented a skin fold. No corresponding abnormality by CT. 3. Indeterminate cutaneous 2.3 cm mass in the right inguinal region. Recommend direct visual inspection. 4. Likely fecal impaction. Electronically Signed   By: Randa Ngo M.D.   On: 05/27/2021 20:41   DG Pelvis Portable  Result Date: 05/27/2021 CLINICAL DATA:  Unwitnessed fall.  Recent left hip pinning. EXAM: PORTABLE PELVIS 1-2 VIEWS COMPARISON:  05/21/2021 FINDINGS: Pins are seen within the left femoral neck region across the subcapital femoral neck fracture seen on prior imaging. New lucency is seen in the inter trochanteric/subtrochanteric region from the pins extending medially inferior to the lesser trochanter. Cannot exclude nondisplaced fracture. IMPRESSION: Previous pinning of the previously seen subcapital femoral neck fracture. New lucency in the intertrochanteric/subtrochanteric region described as above concerning for nondisplaced fracture. Electronically Signed   By: Rolm Baptise M.D.   On:  05/27/2021 18:33   Pelvis Portable  Result Date: 05/23/2021 CLINICAL DATA:  Status post ORIF of left hip fracture EXAM: PORTABLE PELVIS 1-2 VIEWS COMPARISON:  Intraoperative films from earlier in the same day. FINDINGS: Three compression screws are noted traversing the femoral neck on the left into the femoral head. The overall appearance is similar to that seen on the intraoperative films. No acute bony abnormality is noted. IMPRESSION: Status post ORIF of left femoral neck fracture. Electronically Signed   By: Inez Catalina M.D.   On: 05/23/2021 19:46   CT HIP LEFT WO CONTRAST  Result Date: 05/22/2021 CLINICAL DATA:  Hip trauma, fracture suspected. Patient fell 3 weeks ago and is unable to bear weight. EXAM: CT OF THE LEFT HIP WITHOUT CONTRAST TECHNIQUE: Multidetector CT imaging of the left hip was performed according to the standard protocol. Multiplanar CT image reconstructions were also generated. COMPARISON:  Radiographs 05/21/2021 and pelvic CT 11/24/2020. FINDINGS: Bones/Joint/Cartilage The bones appear adequately mineralized. The femoral head is located. There is a minimally displaced subcapital fracture of the left femoral neck with minimal valgus angulation. This fracture demonstrates no intertrochanteric extension. There are underlying moderate left hip degenerative changes with a small joint effusion. The visualized left hemipelvis is intact. Ligaments Suboptimally assessed by CT. Muscles and Tendons Mild generalized muscular atrophy without focal intramuscular hematoma. Soft tissues Iliofemoral atherosclerosis. No significant periarticular hematoma. Prominent stool in the rectum noted. IMPRESSION: CT confirms the presence of a mildly displaced and angulated subcapital fracture of the left femoral neck. Underlying moderate left hip degenerative changes. Electronically Signed   By: Richardean Sale M.D.   On: 05/22/2021 14:50   DG Chest Port 1 View  Result Date: 05/21/2021 CLINICAL DATA:   Preop.  Left hip fracture. EXAM: PORTABLE CHEST 1 VIEW COMPARISON:  11/24/2020 FINDINGS: Normal heart size. No pericardial effusion. Scratch set no pleural effusion or edema. No airspace opacities identified. Calcified granuloma is again noted within the lingula. The visualized osseous structures are unremarkable. IMPRESSION: No acute cardiopulmonary abnormalities. Electronically Signed   By: Kerby Moors M.D.   On: 05/21/2021 19:33   DG C-Arm 1-60 Min-No Report  Result Date: 05/23/2021 Fluoroscopy was utilized by the requesting physician.  No radiographic interpretation.   DG HIP OPERATIVE UNILAT W OR W/O PELVIS LEFT  Result Date: 05/23/2021 CLINICAL DATA:  Left hip surgery EXAM:  OPERATIVE left HIP (WITH PELVIS IF PERFORMED) 2 VIEWS TECHNIQUE: Fluoroscopic spot image(s) were submitted for interpretation post-operatively. COMPARISON:  05/21/2021 FINDINGS: Two low resolution intraoperative spot views of the left hip. Total fluoroscopy time was 46 seconds. The images demonstrate 3 threaded screw fixation of the left femur for femoral neck fracture. IMPRESSION: Intraoperative fluoroscopic assistance provided during left hip surgery Electronically Signed   By: Donavan Foil M.D.   On: 05/23/2021 17:00   DG Hip Unilat W or Wo Pelvis 2-3 Views Left  Result Date: 05/21/2021 CLINICAL DATA:  Hip pain, fall 3 weeks ago EXAM: DG HIP (WITH OR WITHOUT PELVIS) 2-3V LEFT COMPARISON:  CT 11/24/2020 FINDINGS: Findings are suspicious for subacute nondisplaced left subcapital femoral neck fracture. No femoral head dislocation. Mild degenerative changes of both hips. Pubic symphysis and rami are intact. IMPRESSION: Findings suspicious for subacute left subcapital femoral neck fracture Electronically Signed   By: Donavan Foil M.D.   On: 05/21/2021 18:48    Assessment/Plan S/P ORIF (open reduction internal fixation) fracture Left hip Pain seems controlled but he has not started therapy yet On Tylenol WBAT Aspirin  BID for 4 weeks Follow with Ortho Cellulitis of left hand Improving on Keflex Closed displaced fracture of acromial end of left clavicle, sequela Seen by Ortho Continue Sling for now Pain seems Controlled  Iron deficiency anemia due to chronic blood loss and Renal disease Continue Iron Repeat CBC pending COVID-19 virus infection Asymptomatic In Isolation Primary hypertension On no meds Loose control due to his falls Stage 3b chronic kidney disease (HCC) Creat stable Urinary retention Chronic Foley Also on Flomax S/P gastric surgery for [place] Ulcer with Bleeding Per surgery G tube stays Eating well Weight stable On Protnix H/o Diarrhea Has been on Questran and stable    Family/ staff Communication:   Labs/tests ordered:

## 2021-06-04 DIAGNOSIS — D649 Anemia, unspecified: Secondary | ICD-10-CM | POA: Diagnosis not present

## 2021-06-04 DIAGNOSIS — I10 Essential (primary) hypertension: Secondary | ICD-10-CM | POA: Diagnosis not present

## 2021-06-04 LAB — BASIC METABOLIC PANEL
BUN: 15 (ref 4–21)
CO2: 20 (ref 13–22)
Chloride: 103 (ref 99–108)
Creatinine: 1.2 (ref 0.6–1.3)
Glucose: 72
Potassium: 4.4 (ref 3.4–5.3)
Sodium: 133 — AB (ref 137–147)

## 2021-06-04 LAB — CBC AND DIFFERENTIAL
HCT: 27 — AB (ref 41–53)
Hemoglobin: 8.5 — AB (ref 13.5–17.5)
Platelets: 555 — AB (ref 150–399)
WBC: 7.4

## 2021-06-04 LAB — CBC: RBC: 3.38 — AB (ref 3.87–5.11)

## 2021-06-04 LAB — COMPREHENSIVE METABOLIC PANEL: Calcium: 8.3 — AB (ref 8.7–10.7)

## 2021-06-05 ENCOUNTER — Other Ambulatory Visit: Payer: Medicare Other

## 2021-06-07 DIAGNOSIS — S72032D Displaced midcervical fracture of left femur, subsequent encounter for closed fracture with routine healing: Secondary | ICD-10-CM | POA: Diagnosis not present

## 2021-06-11 DIAGNOSIS — Z8616 Personal history of COVID-19: Secondary | ICD-10-CM | POA: Diagnosis not present

## 2021-06-11 DIAGNOSIS — R278 Other lack of coordination: Secondary | ICD-10-CM | POA: Diagnosis not present

## 2021-06-11 DIAGNOSIS — R2681 Unsteadiness on feet: Secondary | ICD-10-CM | POA: Diagnosis not present

## 2021-06-11 DIAGNOSIS — M7989 Other specified soft tissue disorders: Secondary | ICD-10-CM | POA: Diagnosis not present

## 2021-06-11 DIAGNOSIS — R2689 Other abnormalities of gait and mobility: Secondary | ICD-10-CM | POA: Diagnosis not present

## 2021-06-11 DIAGNOSIS — R54 Age-related physical debility: Secondary | ICD-10-CM | POA: Diagnosis not present

## 2021-06-11 DIAGNOSIS — S72062S Displaced articular fracture of head of left femur, sequela: Secondary | ICD-10-CM | POA: Diagnosis not present

## 2021-06-11 DIAGNOSIS — R296 Repeated falls: Secondary | ICD-10-CM | POA: Diagnosis not present

## 2021-06-11 DIAGNOSIS — M25552 Pain in left hip: Secondary | ICD-10-CM | POA: Diagnosis not present

## 2021-06-14 ENCOUNTER — Non-Acute Institutional Stay (INDEPENDENT_AMBULATORY_CARE_PROVIDER_SITE_OTHER): Payer: Medicare Other | Admitting: Adult Health

## 2021-06-14 ENCOUNTER — Encounter: Payer: Self-pay | Admitting: Adult Health

## 2021-06-14 DIAGNOSIS — R54 Age-related physical debility: Secondary | ICD-10-CM | POA: Diagnosis not present

## 2021-06-14 DIAGNOSIS — M6389 Disorders of muscle in diseases classified elsewhere, multiple sites: Secondary | ICD-10-CM | POA: Diagnosis not present

## 2021-06-14 DIAGNOSIS — Z8616 Personal history of COVID-19: Secondary | ICD-10-CM | POA: Diagnosis not present

## 2021-06-14 DIAGNOSIS — R293 Abnormal posture: Secondary | ICD-10-CM | POA: Diagnosis not present

## 2021-06-14 DIAGNOSIS — Z0000 Encounter for general adult medical examination without abnormal findings: Secondary | ICD-10-CM

## 2021-06-14 DIAGNOSIS — R2689 Other abnormalities of gait and mobility: Secondary | ICD-10-CM | POA: Diagnosis not present

## 2021-06-14 DIAGNOSIS — M25552 Pain in left hip: Secondary | ICD-10-CM | POA: Diagnosis not present

## 2021-06-14 DIAGNOSIS — M7989 Other specified soft tissue disorders: Secondary | ICD-10-CM | POA: Diagnosis not present

## 2021-06-14 DIAGNOSIS — S72062S Displaced articular fracture of head of left femur, sequela: Secondary | ICD-10-CM | POA: Diagnosis not present

## 2021-06-14 DIAGNOSIS — R2681 Unsteadiness on feet: Secondary | ICD-10-CM | POA: Diagnosis not present

## 2021-06-14 DIAGNOSIS — R278 Other lack of coordination: Secondary | ICD-10-CM | POA: Diagnosis not present

## 2021-06-14 DIAGNOSIS — R296 Repeated falls: Secondary | ICD-10-CM | POA: Diagnosis not present

## 2021-06-14 NOTE — Progress Notes (Signed)
Subjective:   [name] is a 87 y.o. male who presents for Medicare Annual/Subsequent preventive examination at [facility].   Review of Systems           Objective:    Today's Vitals   06/14/21 0939  Weight: 128 lb 6.4 oz (58.2 kg)   Body mass index is 22.04 kg/m.  Advanced Directives 06/03/2021 05/22/2021 05/21/2021 05/20/2021 04/30/2021 04/04/2021 03/04/2021  Does Patient Have a Medical Advance Directive? Yes - Yes Yes Yes No Yes  Type of Paramedic of [place];Living will;Out of facility DNR (pink MOST or yellow form) - Out of facility DNR (pink MOST or yellow form) [place];Living will;Out of facility DNR (pink MOST or yellow form) [place];Living will;Out of facility DNR (pink MOST or yellow form) - [place];Living will;Out of facility DNR (pink MOST or yellow form)  Does patient want to make changes to medical advance directive? No - Patient declined No - Patient declined - No - Patient declined No - Patient declined - No - Patient declined  Copy of [place] in Chart? Yes - validated most recent copy scanned in chart (See row information) - - Yes - validated most recent copy scanned in chart (See row information) Yes - validated most recent copy scanned in chart (See row information) - Yes - validated most recent copy scanned in chart (See row information)  Would patient like information on creating a medical advance directive? - - - - - No - Patient declined -  Pre-existing out of facility DNR order (yellow form or pink MOST form) Yellow form placed in chart (order not valid for inpatient use);Pink MOST form placed in chart (order not valid for inpatient use) - - Yellow form placed in chart (order not valid for inpatient use);Pink MOST form placed in chart (order not valid for inpatient use) Yellow form placed in chart (order not valid for inpatient use);Pink MOST  form placed in chart (order not valid for inpatient use) - Yellow form placed in chart (order not valid for inpatient use);Pink MOST form placed in chart (order not valid for inpatient use)    Current Medications (verified) Outpatient Encounter Medications as of 06/14/2021  Medication Sig   acetaminophen (TYLENOL) 500 MG tablet Take 1,000 mg by mouth every 8 (eight) hours.   albuterol (VENTOLIN HFA) 108 (90 Base) MCG/ACT inhaler Inhale into the lungs every 6 (six) hours as needed for wheezing or shortness of breath.   antiseptic oral rinse (BIOTENE) LIQD 15 mLs by Mouth Rinse route 4 (four) times daily as needed for dry mouth.   aspirin EC 81 MG tablet Take 81 mg by mouth 2 (two) times daily. Swallow whole.   bisacodyl (DULCOLAX) 10 MG suppository Place 10 mg rectally as needed for moderate constipation.   cholestyramine (QUESTRAN) 4 g packet Take 1 packet (4 g total) by mouth 2 (two) times daily.   iron polysaccharides (NIFEREX) 150 MG capsule Take 150 mg by mouth See admin instructions. Give 150mg  capsule by mouth once a morning with meal and 2 hours separate from Questran   lactose free nutrition (BOOST) LIQD Take 237 mLs by mouth daily as needed (as residents request).   levothyroxine (SYNTHROID) 50 MCG tablet Take 50 mcg by mouth daily before breakfast.   loperamide (IMODIUM) 2 MG capsule Take 2 mg by mouth every 8 (eight) hours as needed for diarrhea or loose stools.   Melatonin 5 MG  CAPS Take 1 capsule (5 mg total) by mouth at bedtime.   ondansetron (ZOFRAN) 4 MG tablet Take 4 mg by mouth every 6 (six) hours as needed for nausea or vomiting.   pantoprazole (PROTONIX) 40 MG tablet Take 40 mg by mouth daily.   Polyethyl Glycol-Propyl Glycol (SYSTANE) 0.4-0.3 % SOLN Two drops both eyes three times daily as needed for dry itchy eyes.   saccharomyces boulardii (FLORASTOR) 250 MG capsule Take 250 mg by mouth 2 (two) times daily.   tamsulosin (FLOMAX) 0.4 MG CAPS capsule Take 1 capsule (0.4 mg  total) by mouth daily.   triamcinolone cream (KENALOG) 0.1 % Apply 1 application topically to back daily as needed for itching or rash   No facility-administered encounter medications on file as of 06/14/2021.    Allergies (verified) Amrix [cyclobenzaprine] and Shellfish allergy   History: Past Medical History:  Diagnosis Date   Acute kidney injury ([place]) 03/06/14   Anemia, unspecified 03/06/14   Arthritis    BPH (benign prostatic hyperplasia)    Cerebral atrophy ([place]) 03/06/14   Cerebrovascular disease 03/06/14   Degenerative disc disease, lumbar 03/06/14   Diverticula, bladder acquired    Edema 03/06/14   GERD (gastroesophageal reflux disease)    H/O: GI bleed 1986   Hiatal hernia    History of fall 03/06/14   Hypercalcemia 03/06/14   Hyperlipidemia    Hypertension    Impotence    LFT elevation 03/06/14   Lumbago 03/06/14   Mallory-Weiss tear 1986   Osteoporosis    Rhabdomyolysis 03/06/14   Scoliosis of cervical spine 03/06/14   Scoliosis of lumbar spine 03/06/14   Shingles    Spermatocele    bilateral, left greater than right   Troponin level elevated 03/06/14   Weak 03/06/14   Past Surgical History:  Procedure Laterality Date   BACK SURGERY  10/2009   ESI for surgery for lumbar spinal stenosis   BIOPSY  11/24/2020   Procedure: BIOPSY;  Surgeon: Milus Banister, MD;  Location: [facility];  Service: Endoscopy;;   clubbed toe repair  2004   COLONOSCOPY  08/15/2011   ESOPHAGOGASTRODUODENOSCOPY (EGD) WITH PROPOFOL N/A 11/24/2020   Procedure: ESOPHAGOGASTRODUODENOSCOPY (EGD) WITH PROPOFOL;  Surgeon: Milus Banister, MD;  Location: [facility];  Service: Endoscopy;  Laterality: N/A;   HIATAL HERNIA REPAIR N/A 11/28/2020   Procedure: Gastrostomy tube placement;  Surgeon: Ralene Ok, MD;  Location: [facility]S;  Service: General;  Laterality: N/A;  [address] Left 05/23/2021   Procedure: CANNULATED HIP PINNING;  Surgeon: Rod Can, MD;  Location: [facility]  ORS;  Service: Orthopedics;  Laterality: Left;  hanna large carm   INGUINAL HERNIA REPAIR Right 11/26/2004   Dr Rebekah [place]field - open w mesh   REPLACEMENT TOTAL KNEE BILATERAL  2007, 2008   TONSILLECTOMY AND ADENOIDECTOMY  1939   X-STOP IMPLANTATION     Family History  Problem Relation Age of Onset   Alcoholism Father    Liver disease Father    Colon cancer Neg Hx    Social History   Socioeconomic History   Marital status: Divorced    Spouse name: Not on file   Number of children: 2   Years of education: Not on file   Highest education level: Not on file  Occupational History   Occupation: retired  Tobacco Use   Smoking status: Former    Types: Cigarettes   Smokeless tobacco: Never   Tobacco comments:    quit in 1960s  Vaping Use   Vaping Use: Never used  Substance and Sexual Activity   Alcohol use: Yes    Alcohol/week: 14.0 standard drinks    Types: 14 Standard drinks or equivalent per week    Comment: couple of drinks of scotch every other day   Drug use: No   Sexual activity: Not on file  Other Topics Concern   Not on file  Social History Narrative   Tobacco use, amount per day now:  NONE   Past tobacco use, amount per day: 1 PACK PER DAY   How many years did you use tobacco: QUIT AGE 5/ 17 YEARS   Alcohol use (drinks per week): 3OZ DAILY/ 2 4OZ GLASSES OF WINE   Diet: REGULAR   Do you drink/eat things with caffeine:   Marital status: DIVORCED              What year were you married?   Do you live in a house, apartment, assisted living, condo, trailer, etc.? [place]    Is it one or more stories?  ONE STORY   How many persons live in your home? 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  WALK                                Type and how often?   Do you have a living will? YES   Do you have a DNR form?     NO                               If not, do you want to discuss one?   Do you have signed POA/HPOA forms?   YES                     If so, please bring to you appointment   Social Determinants of Health   Financial Resource Strain: Not on file  Food Insecurity: Not on file  Transportation Needs: Not on file  Physical Activity: Not on file  Stress: Not on file  Social Connections: Not on file    Tobacco Counseling Counseling given: Not Answered Tobacco comments: quit in 1960s   Clinical Intake:                 Diabetic?no         Activities of Daily Living In your present state of health, do you have any difficulty performing the following activities: 05/22/2021 11/24/2020  Hearing? N N  Vision? Y N  Difficulty concentrating or making decisions? Y N  Walking or climbing stairs? Y N  Dressing or bathing? Y N  Doing errands, shopping? N Y  Some recent data might be hidden    Patient Care Team: [name], MD as PCP - General (Internal Medicine)  Indicate any recent Medical Services you may have received from other than [facility] providers in the past year (date may be approximate).     Assessment:   This is a routine wellness examination for [facility].  Hearing/Vision screen No results found.  Dietary issues and exercise activities discussed:     Goals Addressed   None    Depression Screen [facility] 2/9 Scores 02/29/2020 10/19/2019 06/13/2019 03/16/2019 05/12/2018 12/29/2017 11/11/2017  PHQ - 2 Score 0 0 0 0 0 0 0    Fall Risk Fall Risk  02/29/2020 10/19/2019 06/13/2019 03/16/2019 05/12/2018  Falls in the past year? 0 0 0 0 No  Number falls in past yr: 0 0 0 0 -  Injury with Fall? 0 0 0 0 -  Risk for fall due to : - - Impaired balance/gait - -  Follow up - - Falls evaluation completed - -    FALL RISK PREVENTION PERTAINING TO THE HOME:  Any stairs in or around the home? No  If so, are there any without handrails? Yes  Home free of loose throw rugs in walkways, pet beds, electrical  cords, etc? Yes  Adequate lighting in your home to reduce risk of falls? Yes   ASSISTIVE DEVICES UTILIZED TO PREVENT FALLS:  Life alert? No  Use of a cane, walker or w/c? Yes  Grab bars in the bathroom? Yes  Shower chair or bench in shower? Yes  Elevated toilet seat or a handicapped toilet? Yes   TIMED UP AND GO: pt has a hip fx and is working with therapy  Was the test performed? No .  Length of time to ambulate 10 feet: NA sec.   Gait unsteady with use of assistive device, provider informed and education provided.   Cognitive Function: MMSE - Mini Mental State Exam 06/13/2019 08/20/2017  Orientation to time 4 4  Orientation to Place 4 5  Registration 3 3  Attention/ Calculation 3 5  Recall 3 1  Language- name 2 objects 2 2  Language- repeat 1 1  Language- follow 3 step command 3 3  Language- read & follow direction 1 1  Write a sentence 1 1  Copy design 1 1  Total score 26 27        Immunizations Immunization History  Administered Date(s) Administered   Influenza, High Dose Seasonal PF 06/08/2020   Moderna Sars-Covid-2 Vaccination 09/09/2019, 09/27/2019   Td 09/02/2007   Tdap 11/11/2017, 04/04/2021    TDAP status: Up to date  Flu Vaccine status: Declined, Education has been provided regarding the importance of this vaccine but patient still declined. Advised may receive this vaccine at local pharmacy or Health Dept. Aware to provide a copy of the vaccination record if obtained from local pharmacy or Health Dept. Verbalized acceptance and understanding.  Pneumococcal vaccine status: Declined,  Education has been provided regarding the importance of this vaccine but patient still declined. Advised may receive this vaccine at local pharmacy or Health Dept. Aware to provide a copy of the vaccination record if obtained from local pharmacy or Health Dept. Verbalized acceptance and understanding.   Covid-19 vaccine status: Declined, Education has been provided regarding  the importance of this vaccine but patient still declined. Advised may receive this vaccine at local pharmacy or Health Dept.or vaccine clinic. Aware to provide a copy of the vaccination record if obtained from local pharmacy or Health Dept. Verbalized acceptance and understanding.  Qualifies for Shingles Vaccine? No   Zostavax completed No   Shingrix Completed?: No.    Education has been provided regarding the importance of this vaccine. Patient has been advised to call insurance company to determine out of pocket expense if they have not yet received this vaccine. Advised may also receive vaccine at local pharmacy or Health Dept. Verbalized acceptance and understanding.  Screening Tests Health Maintenance  Topic Date Due   Zoster Vaccines- Shingrix (1 of 2) Never done   Pneumonia Vaccine  103+ Years old (1 - PCV) Never done   COVID-19 Vaccine (3 - Booster) 11/22/2019   DEXA SCAN  04/30/2022 (Originally 12/30/2014)   TETANUS/TDAP  04/05/2031   HPV VACCINES  Aged Out   INFLUENZA VACCINE  Discontinued    Health Maintenance  Health Maintenance Due  Topic Date Due   Zoster Vaccines- Shingrix (1 of 2) Never done   Pneumonia Vaccine 76+ Years old (1 - PCV) Never done   COVID-19 Vaccine (3 - Booster) 11/22/2019    Colorectal cancer screening: No longer required.   Lung Cancer Screening: (Low Dose CT Chest recommended if Age 79-80 years, 30 pack-year currently smoking OR have quit w/in 15years.) does not qualify.   Lung Cancer Screening Referral: NA  Additional Screening:  Hepatitis C Screening: does not qualify; Completed na  Vision Screening: Recommended annual ophthalmology exams for early detection of glaucoma and other disorders of the eye. Is the patient up to date with their annual eye exam?  No  Who is the provider or what is the name of the office in which the patient attends annual eye exams?he says yes, no documentation If pt is not established with a provider, would they  like to be referred to a provider to establish care?  NA .   Dental Screening: Recommended annual dental exams for proper oral hygiene  Community Resource Referral / Chronic Care Management: CRR required this visit?  No   CCM required this visit?  No      Plan:     I have personally reviewed and noted the following in the patient's chart:   Medical and social history Use of alcohol, tobacco or illicit drugs  Current medications and supplements including opioid prescriptions. Patient is not currently taking opioid prescriptions. Functional ability and status Nutritional status Physical activity Advanced directives List of other physicians Hospitalizations, surgeries, and ER visits in previous 12 months Vitals Screenings to include cognitive, depression, and falls Referrals and appointments  In addition, I have reviewed and discussed with patient certain preventive protocols, quality metrics, and best practice recommendations. A written personalized care plan for preventive services as well as general preventive health recommendations were provided to patient.     Royal Hawthorn, NP   06/14/2021   Nurse Notes:Declined all vaccines

## 2021-06-14 NOTE — Patient Instructions (Signed)
[name] , Thank you for taking time to come for your Medicare Wellness Visit. I appreciate your ongoing commitment to your health goals. Please review the following plan we discussed and let me know if I can assist you in the future.   Screening recommendations/referrals: Colonoscopy aged out Recommended yearly ophthalmology/optometry visit for glaucoma screening and checkup Recommended yearly dental visit for hygiene and checkup  Vaccinations: Influenza vaccine refused Pneumococcal vaccine refused Tdap vaccine up t odate Shingles vaccine refused    Advanced directives: reviewed  Conditions/risks identified: fall risk  Next appointment: 1 year  Preventive Care 37 Years and Older, Male Preventive care refers to lifestyle choices and visits with your health care provider that can promote health and wellness. What does preventive care include? A yearly physical exam. This is also called an annual well check. Dental exams once or twice a year. Routine eye exams. Ask your health care provider how often you should have your eyes checked. Personal lifestyle choices, including: Daily care of your teeth and gums. Regular physical activity. Eating a healthy diet. Avoiding tobacco and drug use. Limiting alcohol use. Practicing safe sex. Taking low doses of aspirin every day. Taking vitamin and mineral supplements as recommended by your health care provider. What happens during an annual well check? The services and screenings done by your health care provider during your annual well check will depend on your age, overall health, lifestyle risk factors, and family history of disease. Counseling  Your health care provider may ask you questions about your: Alcohol use. Tobacco use. Drug use. Emotional well-being. Home and relationship well-being. Sexual activity. Eating habits. History of falls. Memory and ability to understand (cognition). Work and work Statistician. Screening   You may have the following tests or measurements: Height, weight, and BMI. Blood pressure. Lipid and cholesterol levels. These may be checked every 5 years, or more frequently if you are over 67 years old. Skin check. Lung cancer screening. You may have this screening every year starting at age 39 if you have a 30-pack-year history of smoking and currently smoke or have quit within the past 15 years. Fecal occult blood test (FOBT) of the stool. You may have this test every year starting at age 46. Flexible sigmoidoscopy or colonoscopy. You may have a sigmoidoscopy every 5 years or a colonoscopy every 10 years starting at age 61. Prostate cancer screening. Recommendations will vary depending on your family history and other risks. Hepatitis C blood test. Hepatitis B blood test. Sexually transmitted disease (STD) testing. Diabetes screening. This is done by checking your blood sugar (glucose) after you have not eaten for a while (fasting). You may have this done every 1-3 years. Abdominal aortic aneurysm (AAA) screening. You may need this if you are a current or former smoker. Osteoporosis. You may be screened starting at age 76 if you are at high risk. Talk with your health care provider about your test results, treatment options, and if necessary, the need for more tests. Vaccines  Your health care provider may recommend certain vaccines, such as: Influenza vaccine. This is recommended every year. Tetanus, diphtheria, and acellular pertussis (Tdap, Td) vaccine. You may need a Td booster every 10 years. Zoster vaccine. You may need this after age 16. Pneumococcal 13-valent conjugate (PCV13) vaccine. One dose is recommended after age 21. Pneumococcal polysaccharide (PPSV23) vaccine. One dose is recommended after age 69. Talk to your health care provider about which screenings and vaccines you need and how often you need them.  This information is not intended to replace advice given to you by  your health care provider. Make sure you discuss any questions you have with your health care provider. Document Released: 08/31/2015 Document Revised: 04/23/2016 Document Reviewed: 06/05/2015 Elsevier Interactive Patient Education  2017 [place] Prevention in the Home Falls can cause injuries. They can happen to people of all ages. There are many things you can do to make your home safe and to help prevent falls. What can I do on the outside of my home? Regularly fix the edges of walkways and driveways and fix any cracks. Remove anything that might make you trip as you walk through a door, such as a raised step or threshold. Trim any bushes or trees on the path to your home. Use bright outdoor lighting. Clear any walking paths of anything that might make someone trip, such as rocks or tools. Regularly check to see if handrails are loose or broken. Make sure that both sides of any steps have handrails. Any raised decks and porches should have guardrails on the edges. Have any leaves, snow, or ice cleared regularly. Use sand or salt on walking paths during winter. Clean up any spills in your garage right away. This includes oil or grease spills. What can I do in the bathroom? Use night lights. Install grab bars by the toilet and in the tub and shower. Do not use towel bars as grab bars. Use non-skid mats or decals in the tub or shower. If you need to sit down in the shower, use a plastic, non-slip stool. Keep the floor dry. Clean up any water that spills on the floor as soon as it happens. Remove soap buildup in the tub or shower regularly. Attach bath mats securely with double-sided non-slip rug tape. Do not have throw rugs and other things on the floor that can make you trip. What can I do in the bedroom? Use night lights. Make sure that you have a light by your bed that is easy to reach. Do not use any sheets or blankets that are too big for your bed. They should not hang  down onto the floor. Have a firm chair that has side arms. You can use this for support while you get dressed. Do not have throw rugs and other things on the floor that can make you trip. What can I do in the kitchen? Clean up any spills right away. Avoid walking on wet floors. Keep items that you use a lot in easy-to-reach places. If you need to reach something above you, use a strong step stool that has a grab bar. Keep electrical cords out of the way. Do not use floor polish or wax that makes floors slippery. If you must use wax, use non-skid floor wax. Do not have throw rugs and other things on the floor that can make you trip. What can I do with my stairs? Do not leave any items on the stairs. Make sure that there are handrails on both sides of the stairs and use them. Fix handrails that are broken or loose. Make sure that handrails are as long as the stairways. Check any carpeting to make sure that it is firmly attached to the stairs. Fix any carpet that is loose or worn. Avoid having throw rugs at the top or bottom of the stairs. If you do have throw rugs, attach them to the floor with carpet tape. Make sure that you have a light switch at the  top of the stairs and the bottom of the stairs. If you do not have them, ask someone to add them for you. What else can I do to help prevent falls? Wear shoes that: Do not have high heels. Have rubber bottoms. Are comfortable and fit you well. Are closed at the toe. Do not wear sandals. If you use a stepladder: Make sure that it is fully opened. Do not climb a closed stepladder. Make sure that both sides of the stepladder are locked into place. Ask someone to hold it for you, if possible. Clearly mark and make sure that you can see: Any grab bars or handrails. First and last steps. Where the edge of each step is. Use tools that help you move around (mobility aids) if they are needed. These  include: Canes. Walkers. Scooters. Crutches. Turn on the lights when you go into a dark area. Replace any light bulbs as soon as they burn out. Set up your furniture so you have a clear path. Avoid moving your furniture around. If any of your floors are uneven, fix them. If there are any pets around you, be aware of where they are. Review your medicines with your doctor. Some medicines can make you feel dizzy. This can increase your chance of falling. Ask your doctor what other things that you can do to help prevent falls. This information is not intended to replace advice given to you by your health care provider. Make sure you discuss any questions you have with your health care provider. Document Released: 05/31/2009 Document Revised: 01/10/2016 Document Reviewed: 09/08/2014 Elsevier Interactive Patient Education  2017 [employer].

## 2021-06-17 DIAGNOSIS — R2689 Other abnormalities of gait and mobility: Secondary | ICD-10-CM | POA: Diagnosis not present

## 2021-06-17 DIAGNOSIS — R293 Abnormal posture: Secondary | ICD-10-CM | POA: Diagnosis not present

## 2021-06-17 DIAGNOSIS — M7989 Other specified soft tissue disorders: Secondary | ICD-10-CM | POA: Diagnosis not present

## 2021-06-17 DIAGNOSIS — S72062S Displaced articular fracture of head of left femur, sequela: Secondary | ICD-10-CM | POA: Diagnosis not present

## 2021-06-17 DIAGNOSIS — R54 Age-related physical debility: Secondary | ICD-10-CM | POA: Diagnosis not present

## 2021-06-17 DIAGNOSIS — R2681 Unsteadiness on feet: Secondary | ICD-10-CM | POA: Diagnosis not present

## 2021-06-17 DIAGNOSIS — M6389 Disorders of muscle in diseases classified elsewhere, multiple sites: Secondary | ICD-10-CM | POA: Diagnosis not present

## 2021-06-17 DIAGNOSIS — R278 Other lack of coordination: Secondary | ICD-10-CM | POA: Diagnosis not present

## 2021-06-17 DIAGNOSIS — Z8616 Personal history of COVID-19: Secondary | ICD-10-CM | POA: Diagnosis not present

## 2021-06-17 DIAGNOSIS — M25552 Pain in left hip: Secondary | ICD-10-CM | POA: Diagnosis not present

## 2021-06-17 DIAGNOSIS — R296 Repeated falls: Secondary | ICD-10-CM | POA: Diagnosis not present

## 2021-06-18 DIAGNOSIS — D649 Anemia, unspecified: Secondary | ICD-10-CM | POA: Diagnosis not present

## 2021-06-18 LAB — COMPREHENSIVE METABOLIC PANEL
Calcium: 8.1 — AB (ref 8.7–10.7)
GFR calc Af Amer: 72.47
GFR calc non Af Amer: 62.53

## 2021-06-18 LAB — BASIC METABOLIC PANEL
BUN: 13 (ref 4–21)
BUN: 13 (ref 4–21)
CO2: 22 (ref 13–22)
Chloride: 100 (ref 99–108)
Creatinine: 1 (ref 0.6–1.3)
Creatinine: 1 (ref 0.6–1.3)
Glucose: 84
Potassium: 4.1 (ref 3.4–5.3)
Sodium: 132 — AB (ref 137–147)

## 2021-06-18 LAB — CBC: RBC: 3.17 — AB (ref 3.87–5.11)

## 2021-06-18 LAB — CBC AND DIFFERENTIAL
HCT: 25 — AB (ref 41–53)
Hemoglobin: 8.3 — AB (ref 13.5–17.5)
Platelets: 421 — AB (ref 150–399)
WBC: 6.3

## 2021-06-24 ENCOUNTER — Encounter: Payer: Self-pay | Admitting: Adult Health

## 2021-06-24 ENCOUNTER — Non-Acute Institutional Stay (SKILLED_NURSING_FACILITY): Payer: Medicare Other | Admitting: Adult Health

## 2021-06-24 DIAGNOSIS — Z8781 Personal history of (healed) traumatic fracture: Secondary | ICD-10-CM | POA: Diagnosis not present

## 2021-06-24 DIAGNOSIS — D500 Iron deficiency anemia secondary to blood loss (chronic): Secondary | ICD-10-CM

## 2021-06-24 DIAGNOSIS — M4155 Other secondary scoliosis, thoracolumbar region: Secondary | ICD-10-CM

## 2021-06-24 DIAGNOSIS — Z9889 Other specified postprocedural states: Secondary | ICD-10-CM

## 2021-06-24 DIAGNOSIS — K257 Chronic gastric ulcer without hemorrhage or perforation: Secondary | ICD-10-CM

## 2021-06-24 DIAGNOSIS — K580 Irritable bowel syndrome with diarrhea: Secondary | ICD-10-CM

## 2021-06-24 DIAGNOSIS — S72002S Fracture of unspecified part of neck of left femur, sequela: Secondary | ICD-10-CM | POA: Diagnosis not present

## 2021-06-24 NOTE — Progress Notes (Signed)
Location:  Occupational psychologist of Service:  SNF (31) Provider:   Cindi Carbon, [place] [phone]   [name], MD  Patient Care Team: [name], MD as PCP - General (Internal Medicine)  Extended Emergency Contact Information Primary Emergency Contact: Rudean Hitt Address: [place], [place] [address] [name] of [employer] Phone: [phone] Mobile Phone: [phone] Relation: Daughter Secondary Emergency Contact: Simms,Carol Address: [address]          [place],  [address] [place] Phone: [phone] Work Phone: [phone] Relation: Other  Code Status:  DNR Goals of care: Advanced Directive information Advanced Directives 06/03/2021  Does Patient Have a Medical Advance Directive? Yes  Type of Paramedic of [place];Living will;Out of facility DNR (pink MOST or yellow form)  Does patient want to make changes to medical advance directive? No - Patient declined  Copy of [place] in Chart? Yes - validated most recent copy scanned in chart (See row information)  Would patient like information on creating a medical advance directive? -  Pre-existing out of facility DNR order (yellow form or pink MOST form) Yellow form placed in chart (order not valid for inpatient use);Pink MOST form placed in chart (order not valid for inpatient use)     Chief Complaint  Patient presents with   Medical Management of Chronic Issues    HPI:  Pt is a 87 y.o. male seen today for medical management of chronic diseases.    ORIF 10/6 of the left hip. Saw ortho two weeks ago, WBAT ordered and asa bid. Fx is healing well, minimal pain. No motivated to participate in therapy. Using a lift for transfers.   Anemia- history of anemia due to GI bleed. Remains on Iron. Hgb 8.6, denies any abd pain, no bleeding issues.   Has history  of cameron ulcer is followed by Dr. Rosendo Gros and G tube remains in place to prevent hiatal hernia.   Has IBS and takes [place]. No issues with diarrhea or abd pain.   Continues with foley due to retention and bph.   No acute concerns.   Past Medical History:  Diagnosis Date   Acute kidney injury ([place]) 03/06/14   Anemia, unspecified 03/06/14   Arthritis    BPH (benign prostatic hyperplasia)    Cerebral atrophy ([place]) 03/06/14   Cerebrovascular disease 03/06/14   Degenerative disc disease, lumbar 03/06/14   Diverticula, bladder acquired    Edema 03/06/14   GERD (gastroesophageal reflux disease)    H/O: GI bleed 1986   Hiatal hernia    History of fall 03/06/14   Hypercalcemia 03/06/14   Hyperlipidemia    Hypertension    Impotence    LFT elevation 03/06/14   Lumbago 03/06/14   Mallory-Weiss tear 1986   Osteoporosis    Rhabdomyolysis 03/06/14   Scoliosis of cervical spine 03/06/14   Scoliosis of lumbar spine 03/06/14   Shingles    Spermatocele    bilateral, left greater than right   Troponin level elevated 03/06/14   Weak 03/06/14   Past Surgical History:  Procedure Laterality Date   BACK SURGERY  10/2009   ESI for surgery for lumbar spinal stenosis   BIOPSY  11/24/2020   Procedure: BIOPSY;  Surgeon: Milus Banister, MD;  Location: [facility];  Service: Endoscopy;;   clubbed toe repair  2004  COLONOSCOPY  08/15/2011   ESOPHAGOGASTRODUODENOSCOPY (EGD) WITH PROPOFOL N/A 11/24/2020   Procedure: ESOPHAGOGASTRODUODENOSCOPY (EGD) WITH PROPOFOL;  Surgeon: Milus Banister, MD;  Location: [facility];  Service: Endoscopy;  Laterality: N/A;   HIATAL HERNIA REPAIR N/A 11/28/2020   Procedure: Gastrostomy tube placement;  Surgeon: Ralene Ok, MD;  Location: [facility];  Service: General;  Laterality: N/A;  [address] Left 05/23/2021   Procedure: CANNULATED HIP PINNING;  Surgeon: Rod Can, MD;  Location: [facility];  Service: Orthopedics;  Laterality: Left;  hanna  large carm   INGUINAL HERNIA REPAIR Right 11/26/2004   Dr Rebekah [place] - open w mesh   REPLACEMENT TOTAL KNEE BILATERAL  2007, 2008   TONSILLECTOMY AND ADENOIDECTOMY  1939   X-STOP IMPLANTATION      Allergies  Allergen Reactions   Amrix [Cyclobenzaprine]    Shellfish Allergy     Not documented on the [facility]    Outpatient Encounter Medications as of 06/24/2021  Medication Sig   acetaminophen (TYLENOL) 500 MG tablet Take 1,000 mg by mouth every 8 (eight) hours.   albuterol (VENTOLIN HFA) 108 (90 Base) MCG/ACT inhaler Inhale into the lungs every 6 (six) hours as needed for wheezing or shortness of breath.   antiseptic oral rinse (BIOTENE) LIQD 15 mLs by Mouth Rinse route 4 (four) times daily as needed for dry mouth.   aspirin EC 81 MG tablet Take 81 mg by mouth 2 (two) times daily. Swallow whole.   bisacodyl (DULCOLAX) 10 MG suppository Place 10 mg rectally as needed for moderate constipation.   cholestyramine (QUESTRAN) 4 g packet Take 1 packet (4 g total) by mouth 2 (two) times daily.   iron polysaccharides (NIFEREX) 150 MG capsule Take 150 mg by mouth See admin instructions. Give 150mg  capsule by mouth once a morning with meal and 2 hours separate from Questran   lactose free nutrition (BOOST) LIQD Take 237 mLs by mouth daily as needed (as residents request).   levothyroxine (SYNTHROID) 50 MCG tablet Take 50 mcg by mouth daily before breakfast.   loperamide (IMODIUM) 2 MG capsule Take 2 mg by mouth every 8 (eight) hours as needed for diarrhea or loose stools.   Melatonin 5 MG CAPS Take 1 capsule (5 mg total) by mouth at bedtime.   ondansetron (ZOFRAN) 4 MG tablet Take 4 mg by mouth every 6 (six) hours as needed for nausea or vomiting.   pantoprazole (PROTONIX) 40 MG tablet Take 40 mg by mouth daily.   Polyethyl Glycol-Propyl Glycol (SYSTANE) 0.4-0.3 % SOLN Two drops both eyes three times daily as needed for dry itchy eyes.   saccharomyces boulardii (FLORASTOR) 250 MG capsule Take 250 mg by mouth  2 (two) times daily.   tamsulosin (FLOMAX) 0.4 MG CAPS capsule Take 1 capsule (0.4 mg total) by mouth daily.   triamcinolone cream (KENALOG) 0.1 % Apply 1 application topically to back daily as needed for itching or rash   No facility-administered encounter medications on file as of 06/24/2021.    Review of Systems  Constitutional:  Negative for activity change, appetite change, chills, diaphoresis, fatigue, fever and unexpected weight change.  HENT:  Negative for congestion.   Respiratory:  Negative for cough, shortness of breath, wheezing and stridor.   Cardiovascular:  Negative for chest pain, palpitations and leg swelling.  Gastrointestinal:  Negative for abdominal distention, abdominal pain, constipation and diarrhea.  Genitourinary:  Negative for difficulty urinating and dysuria.  Musculoskeletal:  Positive for back pain and gait problem.  Negative for arthralgias, joint swelling and myalgias.       One leg is shorter.  Has chronic back pain due to scoliosis  Neurological:  Positive for weakness. Negative for dizziness, seizures, syncope, facial asymmetry, speech difficulty and headaches.  Hematological:  Negative for adenopathy. Does not bruise/bleed easily.  Psychiatric/Behavioral:  Positive for confusion. Negative for agitation and behavioral problems.    Immunization History  Administered Date(s) Administered   Influenza, High Dose Seasonal PF 06/08/2020   Moderna Sars-Covid-2 Vaccination 09/09/2019, 09/27/2019   Td 09/02/2007   Tdap 11/11/2017, 04/04/2021   Pertinent  Health Maintenance Due  Topic Date Due   DEXA SCAN  04/30/2022 (Originally 12/30/2014)   INFLUENZA VACCINE  Discontinued   Fall Risk 05/27/2021 05/27/2021 05/27/2021 05/28/2021 06/14/2021  Falls in the past year? - - - - 1  Was there an injury with Fall? - - - - 1  Fall Risk Category Calculator - - - - 3  Fall Risk Category - - - - High  Patient Fall Risk Level High fall risk High fall risk High fall risk  High fall risk High fall risk  Patient at Risk for Falls Due to - - - - Impaired balance/gait;Impaired mobility  Fall risk Follow up - - - - Falls evaluation completed;Falls prevention discussed   Functional Status Survey:    Vitals:   06/24/21 1127  Weight: 131 lb 6.4 oz (59.6 kg)   Body mass index is 22.55 kg/m. Wt Readings from Last 3 Encounters:  06/24/21 131 lb 6.4 oz (59.6 kg)  06/14/21 128 lb 6.4 oz (58.2 kg)  06/03/21 128 lb 6.4 oz (58.2 kg)    Physical Exam Constitutional:      General: He is not in acute distress.    Appearance: He is not diaphoretic.  HENT:     Head: Normocephalic and atraumatic.  Neck:     Thyroid: No thyromegaly.     Vascular: No JVD.     Trachea: No tracheal deviation.  Cardiovascular:     Rate and Rhythm: Normal rate and regular rhythm.     Heart sounds: No murmur heard. Pulmonary:     Effort: Pulmonary effort is normal. No respiratory distress.     Breath sounds: Normal breath sounds. No wheezing.  Abdominal:     General: Bowel sounds are normal. There is no distension.     Palpations: Abdomen is soft.     Tenderness: There is no abdominal tenderness.  Musculoskeletal:        General: No swelling, tenderness, deformity or signs of injury.     Right lower leg: No edema.     Left lower leg: No edema.     Comments: Kyphosis, leg length discrepancy. R>L  Lymphadenopathy:     Cervical: No cervical adenopathy.  Skin:    General: Skin is warm and dry.     Coloration: Skin is pale.  Neurological:     General: No focal deficit present.     Mental Status: He is alert. Mental status is at baseline.  Psychiatric:        Mood and Affect: Mood normal.    Labs reviewed: Recent Labs    11/26/20 0332 11/27/20 0327 11/28/20 0340 11/29/20 0338 12/06/20 0000 05/23/21 0328 05/24/21 0331 05/25/21 0324 06/04/21 0000 06/18/21 0000  NA 134* 134* 132* 136   < > 133* 134* 137 133* 132*  K 3.9 4.0 3.9 4.3   < > 3.8 3.9 4.0 4.4 4.1  CL 109  106 102 105   < > 102 103 103 103 100  CO2 19* 21* 20* 22   < > 25 25 26 20 22   GLUCOSE 103* 97 98 132*   < > 95 163* 101*  --   --   BUN 24* 21 17 20    < > 18 19 22 15 13   CREATININE 1.38* 1.29* 1.49* 1.49*   < > 1.16 1.28* 1.14 1.2 1.0  CALCIUM 7.7* 7.9* 7.7* 8.1*   < > 8.1* 8.4* 8.4* 8.3* 8.1*  MG 1.7 2.0 1.8 2.0  --   --   --   --   --   --   PHOS 2.6 3.3 3.0  --   --   --   --   --   --   --    < > = values in this interval not displayed.   Recent Labs    11/29/20 0338 12/06/20 0000 04/08/21 0000 05/22/21 0538 05/23/21 0328  AST 16   < > 16 15 13*  ALT 12   < > 7* 9 9  ALKPHOS 68   < > 75 70 74  BILITOT 0.7  --   --  0.5 0.6  PROT 6.4*  --   --  6.3* 6.3*  ALBUMIN 2.7*   < > 3.0* 2.6* 2.6*   < > = values in this interval not displayed.   Recent Labs    11/27/20 0327 11/28/20 0340 11/29/20 0338 05/21/21 2007 05/22/21 0538 05/24/21 0331 05/25/21 0324 05/26/21 0321 06/04/21 0000 06/18/21 0000  WBC 8.4 9.2   < > 11.2*   < > 7.9 11.0* 12.1* 7.4 6.3  NEUTROABS 5.1 5.8  --  7.4  --   --   --   --   --   --   HGB 7.6*  8.4* 8.4*   < > 9.5*   < > 8.0* 7.3* 7.6* 8.5* 8.3*  HCT 23.8*  26.1* 26.9*   < > 31.0*   < > 26.1* 22.9* 25.1* 27* 25*  MCV 91.3 93.4   < > 80.3   < > 80.6 80.1 80.2  --   --   PLT 265 284   < > 533*   < > 497* 442* 511* 555* 421*   < > = values in this interval not displayed.   Lab Results  Component Value Date   TSH 5.07 04/08/2021   Lab Results  Component Value Date   HGBA1C 5.6 11/26/2020   No results found for: CHOL, HDL, LDLCALC, LDLDIRECT, TRIG, CHOLHDL  Significant Diagnostic Results in last 30 days:  CT PELVIS WO CONTRAST  Result Date: 05/27/2021 CLINICAL DATA:  Pelvic trauma, left hip pinning EXAM: CT PELVIS WITHOUT CONTRAST TECHNIQUE: Multidetector CT imaging of the pelvis was performed following the standard protocol without intravenous contrast. COMPARISON:  05/23/2021, 05/27/2021, 05/22/2021 FINDINGS: Urinary Tract: Visualized  portions of the kidneys and ureters are unremarkable. Bladder is decompressed with a Foley catheter in place. Diffuse bladder wall thickening could be sequela of chronic bladder outlet obstruction. Bowel: There is a large amount of retained stool within the colon, with likely impacted stool within the rectal vault. No bowel obstruction. Vascular/Lymphatic: Extensive atherosclerosis. No pathologic adenopathy. Reproductive:  The prostate is not enlarged. Other: No free fluid or free gas. Indeterminate hyperdense 2.3 x 1.9 cm cutaneous mass in the right inguinal region reference image 129/10. Recommend direct visual inspection. Musculoskeletal: Postsurgical changes are seen from pinning of a subcapital  left femoral neck fracture, without significant change since prior studies. The oblique lucency seen within the intertrochanteric region on the recent x-ray is not identified on the CT images, and may have reflected an overlying skin fold. There are no other acute displaced fractures. Severe spondylosis and facet hypertrophy noted at the lumbosacral junction. Reconstructed images confirm the above findings. IMPRESSION: 1. Subcapital left femoral neck fracture status post pinning. 2. The oblique lucency seen within the intertrochanteric region on recent x-ray likely represented a skin fold. No corresponding abnormality by CT. 3. Indeterminate cutaneous 2.3 cm mass in the right inguinal region. Recommend direct visual inspection. 4. Likely fecal impaction. Electronically Signed   By: Randa Ngo M.D.   On: 05/27/2021 20:41   DG Pelvis Portable  Result Date: 05/27/2021 CLINICAL DATA:  Unwitnessed fall.  Recent left hip pinning. EXAM: PORTABLE PELVIS 1-2 VIEWS COMPARISON:  05/21/2021 FINDINGS: Pins are seen within the left femoral neck region across the subcapital femoral neck fracture seen on prior imaging. New lucency is seen in the inter trochanteric/subtrochanteric region from the pins extending medially inferior  to the lesser trochanter. Cannot exclude nondisplaced fracture. IMPRESSION: Previous pinning of the previously seen subcapital femoral neck fracture. New lucency in the intertrochanteric/subtrochanteric region described as above concerning for nondisplaced fracture. Electronically Signed   By: Rolm Baptise M.D.   On: 05/27/2021 18:33    Assessment/Plan 1. Closed left hip fracture, sequela Led to #2   2. S/P ORIF (open reduction internal fixation) fracture Left hip Healing per ortho notes WBAT Not participating well with PT and is using a hoyer lift Fall risk   3. S/P gastric surgery Has feeding tube in place to prevent hernia from sliding Flush drain, maintain but is not used for nutrition   4. Scoliosis of thoracolumbar region due to degenerative disease of spine in adult Has mild chronic pain to the thoracic/lumbar area  Continue tylenol 1 gram tid   5. Iron deficiency anemia due to chronic blood loss Lab Results  Component Value Date   HGB 8.3 (A) 06/18/2021   No change, no new issues with bleeding Continue Niferex, recheck CBC   6. Chronic Cameron ulcer Continue PPI therapy   7. Irritable bowel syndrome with diarrhea No current issues Continue Florastor and Furniture conservator/restorer Communication: resident and nurse   Labs/tests ordered:  CBC with iron panel in one month

## 2021-07-02 DIAGNOSIS — R278 Other lack of coordination: Secondary | ICD-10-CM | POA: Diagnosis not present

## 2021-07-02 DIAGNOSIS — R293 Abnormal posture: Secondary | ICD-10-CM | POA: Diagnosis not present

## 2021-07-02 DIAGNOSIS — R296 Repeated falls: Secondary | ICD-10-CM | POA: Diagnosis not present

## 2021-07-02 DIAGNOSIS — R54 Age-related physical debility: Secondary | ICD-10-CM | POA: Diagnosis not present

## 2021-07-02 DIAGNOSIS — M7989 Other specified soft tissue disorders: Secondary | ICD-10-CM | POA: Diagnosis not present

## 2021-07-02 DIAGNOSIS — M6389 Disorders of muscle in diseases classified elsewhere, multiple sites: Secondary | ICD-10-CM | POA: Diagnosis not present

## 2021-07-03 DIAGNOSIS — R54 Age-related physical debility: Secondary | ICD-10-CM | POA: Diagnosis not present

## 2021-07-03 DIAGNOSIS — R296 Repeated falls: Secondary | ICD-10-CM | POA: Diagnosis not present

## 2021-07-03 DIAGNOSIS — M7989 Other specified soft tissue disorders: Secondary | ICD-10-CM | POA: Diagnosis not present

## 2021-07-03 DIAGNOSIS — R278 Other lack of coordination: Secondary | ICD-10-CM | POA: Diagnosis not present

## 2021-07-03 DIAGNOSIS — M6389 Disorders of muscle in diseases classified elsewhere, multiple sites: Secondary | ICD-10-CM | POA: Diagnosis not present

## 2021-07-03 DIAGNOSIS — R293 Abnormal posture: Secondary | ICD-10-CM | POA: Diagnosis not present

## 2021-07-04 ENCOUNTER — Encounter: Payer: Self-pay | Admitting: Adult Health

## 2021-07-04 ENCOUNTER — Non-Acute Institutional Stay (SKILLED_NURSING_FACILITY): Payer: Medicare Other | Admitting: Adult Health

## 2021-07-04 DIAGNOSIS — R1113 Vomiting of fecal matter: Secondary | ICD-10-CM | POA: Diagnosis not present

## 2021-07-04 DIAGNOSIS — K253 Acute gastric ulcer without hemorrhage or perforation: Secondary | ICD-10-CM

## 2021-07-04 DIAGNOSIS — R109 Unspecified abdominal pain: Secondary | ICD-10-CM

## 2021-07-04 DIAGNOSIS — R112 Nausea with vomiting, unspecified: Secondary | ICD-10-CM | POA: Diagnosis not present

## 2021-07-04 DIAGNOSIS — E860 Dehydration: Secondary | ICD-10-CM | POA: Diagnosis not present

## 2021-07-04 LAB — CBC AND DIFFERENTIAL
HCT: 29 — AB (ref 41–53)
Hemoglobin: 9.4 — AB (ref 13.5–17.5)
Platelets: 458 — AB (ref 150–399)
WBC: 4.1

## 2021-07-04 LAB — COMPREHENSIVE METABOLIC PANEL
Albumin: 3.3 — AB (ref 3.5–5.0)
Calcium: 8.4 — AB (ref 8.7–10.7)
GFR calc Af Amer: 69.16
GFR calc non Af Amer: 59.67
Globulin: 3.1

## 2021-07-04 LAB — BASIC METABOLIC PANEL
BUN: 14 (ref 4–21)
CO2: 24 — AB (ref 13–22)
Chloride: 94 — AB (ref 99–108)
Creatinine: 1.1 (ref 0.6–1.3)
Glucose: 92
Potassium: 3.8 (ref 3.4–5.3)
Sodium: 133 — AB (ref 137–147)

## 2021-07-04 LAB — HEPATIC FUNCTION PANEL
ALT: 7 — AB (ref 10–40)
AST: 14 (ref 14–40)
Alkaline Phosphatase: 97 (ref 25–125)

## 2021-07-04 LAB — CBC: RBC: 3.64 — AB (ref 3.87–5.11)

## 2021-07-04 MED ORDER — MORPHINE SULFATE (CONCENTRATE) 20 MG/ML PO SOLN: 5.0000 mg | ORAL | 0 refills | Status: DC | PRN

## 2021-07-04 NOTE — Progress Notes (Signed)
Location:  [facility] Room Number: 132 Place of Service:  SNF [phone]) Provider:  Royal Hawthorn NP   [name], MD  Patient Care Team: [name], MD as PCP - General (Internal Medicine)  Extended Emergency Contact Information Primary Emergency Contact: Rudean Hitt Address: [place], [place] [address] [name] of [place] Phone: [phone] Mobile Phone: [phone] Relation: Daughter Secondary Emergency Contact: Simms,Carol Address: [address]          [place], [place] [address] [place] Phone: [phone] Work Phone: [phone] Relation: Other  Code Status:  DNR  Goals of care: Advanced Directive information Advanced Directives 06/03/2021  Does Patient Have a Medical Advance Directive? Yes  Type of Paramedic of [place];Living will;Out of facility DNR (pink MOST or yellow form)  Does patient want to make changes to medical advance directive? No - Patient declined  Copy of [place] in Chart? Yes - validated most recent copy scanned in chart (See row information)  Would patient like information on creating a medical advance directive? -  Pre-existing out of facility DNR order (yellow form or pink MOST form) Yellow form placed in chart (order not valid for inpatient use);Pink MOST form placed in chart (order not valid for inpatient use)     Chief Complaint  Patient presents with   Vomiting    HPI:  Pt is a 87 y.o. male seen today for an acute visit for vomiting   He began vomiting dark brown emesis overnight.This morning he vomited x2. Since start of vomiting he received PRN Zofran and Phenergan with little relief. He is unable to tolerate meals and remains on clear fluids. Stat KUB and labs pending.  Anemia- history of anemia due to GI bleed. Remains on Iron. Last Hgb 7.6 and Hct 25.1. RBC 11/1 3.17.  Has history of  cameron ulcer is followed by Dr. Rosendo Gros and G tube remains in place to prevent hiatal hernia.   Patient reports increased nausea and abdominal discomfort. He explained that he is not feeling well and does not wish to go to the hospital for further workup.    Past Medical History:  Diagnosis Date   Acute kidney injury ([place]) 03/06/14   Anemia, unspecified 03/06/14   Arthritis    BPH (benign prostatic hyperplasia)    Cerebral atrophy ([place]) 03/06/14   Cerebrovascular disease 03/06/14   Degenerative disc disease, lumbar 03/06/14   Diverticula, bladder acquired    Edema 03/06/14   GERD (gastroesophageal reflux disease)    H/O: GI bleed 1986   Hiatal hernia    History of fall 03/06/14   Hypercalcemia 03/06/14   Hyperlipidemia    Hypertension    Impotence    LFT elevation 03/06/14   Lumbago 03/06/14   Mallory-Weiss tear 1986   Osteoporosis    Rhabdomyolysis 03/06/14   Scoliosis of cervical spine 03/06/14   Scoliosis of lumbar spine 03/06/14   Shingles    Spermatocele    bilateral, left greater than right   Troponin level elevated 03/06/14   Weak 03/06/14   Past Surgical History:  Procedure Laterality Date   BACK SURGERY  10/2009   ESI for surgery for lumbar spinal stenosis   BIOPSY  11/24/2020   Procedure: BIOPSY;  Surgeon: Milus Banister, MD;  [facility];  Service: Endoscopy;;   clubbed toe repair  2004  COLONOSCOPY  08/15/2011   ESOPHAGOGASTRODUODENOSCOPY (EGD) WITH PROPOFOL N/A 11/24/2020   Procedure: ESOPHAGOGASTRODUODENOSCOPY (EGD) WITH PROPOFOL;  Surgeon: Milus Banister, MD;  [facility];  Service: Endoscopy;  Laterality: N/A;   HIATAL HERNIA REPAIR N/A 11/28/2020   Procedure: Gastrostomy tube placement;  Surgeon: Ralene Ok, MD;  [facility];  Service: General;  Laterality: N/A;  [address] [place] Left 05/23/2021   Procedure: CANNULATED HIP PINNING;  Surgeon: Rod Can, MD;  [facility];  Service: Orthopedics;   Laterality: Left;  hanna large carm   INGUINAL HERNIA REPAIR Right 11/26/2004   Dr Rebekah Chesterfield - open w mesh   REPLACEMENT TOTAL KNEE BILATERAL  2007, 2008   TONSILLECTOMY AND ADENOIDECTOMY  1939   X-STOP IMPLANTATION      Allergies  Allergen Reactions   Amrix [Cyclobenzaprine]    Shellfish Allergy     Not documented on the [facility]    Outpatient Encounter Medications as of 07/04/2021  Medication Sig   acetaminophen (TYLENOL) 500 MG tablet Take 1,000 mg by mouth every 8 (eight) hours.   albuterol (VENTOLIN HFA) 108 (90 Base) MCG/ACT inhaler Inhale into the lungs every 6 (six) hours as needed for wheezing or shortness of breath.   antiseptic oral rinse (BIOTENE) LIQD 15 mLs by Mouth Rinse route 4 (four) times daily as needed for dry mouth.   bisacodyl (DULCOLAX) 10 MG suppository Place 10 mg rectally as needed for moderate constipation.   cholestyramine (QUESTRAN) 4 g packet Take 1 packet (4 g total) by mouth 2 (two) times daily.   iron polysaccharides (NIFEREX) 150 MG capsule Take 150 mg by mouth See admin instructions. Give 150mg  capsule by mouth once a morning with meal and 2 hours separate from Questran   lactose free nutrition (BOOST) LIQD Take 237 mLs by mouth daily as needed (as residents request).   levothyroxine (SYNTHROID) 50 MCG tablet Take 50 mcg by mouth daily before breakfast.   loperamide (IMODIUM) 2 MG capsule Take 2 mg by mouth every 8 (eight) hours as needed for diarrhea or loose stools.   Melatonin 5 MG CAPS Take 1 capsule (5 mg total) by mouth at bedtime.   ondansetron (ZOFRAN) 4 MG tablet Take 4 mg by mouth every 6 (six) hours as needed for nausea or vomiting.   pantoprazole (PROTONIX) 40 MG tablet Take 40 mg by mouth daily.   Polyethyl Glycol-Propyl Glycol (SYSTANE) 0.4-0.3 % SOLN Two drops both eyes three times daily as needed for dry itchy eyes.   saccharomyces boulardii (FLORASTOR) 250 MG capsule Take 250 mg by mouth 2 (two) times daily.   tamsulosin (FLOMAX) 0.4 MG CAPS  capsule Take 1 capsule (0.4 mg total) by mouth daily.   triamcinolone cream (KENALOG) 0.1 % Apply 1 application topically to back daily as needed for itching or rash   No facility-administered encounter medications on file as of 07/04/2021.    Review of Systems  Constitutional:  Positive for activity change, appetite change, diaphoresis and fatigue.  HENT:  Negative for congestion.   Respiratory:  Positive for cough. Negative for shortness of breath and wheezing.   Cardiovascular:  Negative for chest pain, palpitations and leg swelling.  Neurological:  Positive for weakness.   Immunization History  Administered Date(s) Administered   Influenza, High Dose Seasonal PF 06/08/2020   Moderna Sars-Covid-2 Vaccination 09/09/2019, 09/27/2019   Td 09/02/2007   Tdap 11/11/2017, 04/04/2021   Pertinent  Health Maintenance Due  Topic Date Due   DEXA SCAN  04/30/2022 (Originally 12/30/2014)   INFLUENZA VACCINE  Discontinued   Fall Risk 05/27/2021 05/27/2021 05/27/2021 05/28/2021 06/14/2021  Falls in the past year? - - - - 1  Was there an injury with Fall? - - - - 1  Fall Risk Category Calculator - - - - 3  Fall Risk Category - - - - High  Patient Fall Risk Level High fall risk High fall risk High fall risk High fall risk High fall risk  Patient at Risk for Falls Due to - - - - Impaired balance/gait;Impaired mobility  Fall risk Follow up - - - - Falls evaluation completed;Falls prevention discussed   Functional Status Survey:    Vitals:   07/04/21 1138  BP: (!) 146/90  Pulse: 97  Resp: (!) 28  SpO2: 92%   There is no height or weight on file to calculate BMI. Physical Exam Constitutional:      Appearance: He is ill-appearing.  HENT:     Head: Normocephalic.     Nose: No congestion.     Mouth/Throat:     Mouth: Mucous membranes are moist.  Eyes:     Pupils: Pupils are equal, round, and reactive to light.  Cardiovascular:     Rate and Rhythm: Regular rhythm. Tachycardia present.   Pulmonary:     Effort: Tachypnea present.     Breath sounds: Normal breath sounds.  Abdominal:     General: Abdomen is flat. Bowel sounds are decreased.     Tenderness: There is abdominal tenderness.  Musculoskeletal:     Cervical back: Normal range of motion.  Skin:    General: Skin is warm.     Coloration: Skin is pale.  Neurological:     Mental Status: He is alert.     Motor: Weakness present.     Gait: Gait abnormal.    Labs reviewed: Recent Labs    11/26/20 0332 11/27/20 0327 11/28/20 0340 11/29/20 0338 12/06/20 0000 05/23/21 0328 05/24/21 0331 05/25/21 0324 06/04/21 0000 06/18/21 0000  NA 134* 134* 132* 136   < > 133* 134* 137 133* 132*  K 3.9 4.0 3.9 4.3   < > 3.8 3.9 4.0 4.4 4.1  CL 109 106 102 105   < > 102 103 103 103 100  CO2 19* 21* 20* 22   < > 25 25 26 20 22   GLUCOSE 103* 97 98 132*   < > 95 163* 101*  --   --   BUN 24* 21 17 20    < > 18 19 22 15 13  13   CREATININE 1.38* 1.29* 1.49* 1.49*   < > 1.16 1.28* 1.14 1.2 1.0  1.0  CALCIUM 7.7* 7.9* 7.7* 8.1*   < > 8.1* 8.4* 8.4* 8.3* 8.1*  MG 1.7 2.0 1.8 2.0  --   --   --   --   --   --   PHOS 2.6 3.3 3.0  --   --   --   --   --   --   --    < > = values in this interval not displayed.   Recent Labs    11/29/20 0338 12/06/20 0000 04/08/21 0000 05/22/21 0538 05/23/21 0328  AST 16   < > 16 15 13*  ALT 12   < > 7* 9 9  ALKPHOS 68   < > 75 70 74  BILITOT 0.7  --   --  0.5 0.6  PROT 6.4*  --   --  6.3* 6.3*  ALBUMIN 2.7*   < > 3.0* 2.6* 2.6*   < > = values in this interval not displayed.   Recent Labs    11/27/20 0327 11/28/20 0340 11/29/20 0338 05/21/21 2007 05/22/21 0538 05/24/21 0331 05/25/21 0324 05/26/21 0321 06/04/21 0000 06/18/21 0000  WBC 8.4 9.2   < > 11.2*   < > 7.9 11.0* 12.1* 7.4 6.3  NEUTROABS 5.1 5.8  --  7.4  --   --   --   --   --   --   HGB 7.6*  8.4* 8.4*   < > 9.5*   < > 8.0* 7.3* 7.6* 8.5* 8.3*  HCT 23.8*  26.1* 26.9*   < > 31.0*   < > 26.1* 22.9* 25.1* 27* 25*  MCV  91.3 93.4   < > 80.3   < > 80.6 80.1 80.2  --   --   PLT 265 284   < > 533*   < > 497* 442* 511* 555* 421*   < > = values in this interval not displayed.   Lab Results  Component Value Date   TSH 5.07 04/08/2021   Lab Results  Component Value Date   HGBA1C 5.6 11/26/2020   No results found for: CHOL, HDL, LDLCALC, LDLDIRECT, TRIG, CHOLHDL  Significant Diagnostic Results in last 30 days:  No results found.  Assessment/Plan 1. Vomiting of fecal matter with nausea Continue clear fluids Add IM Zofran Stop tylenol, aspirin, iron   2. Abdominal discomfort Ongoing, Stat KUB pending  Start Roxanol 20 mg/57ml give 5 mg q 4 hours PO PRN  May use zofran 4 mg IM q 8 PRN for nausea and vomiting  Discussion with family and patient and plan for comfort care no hospitalization due to poor quality of life and pt wishes.   3. Cameron ulcer, acute with bleeding & anemia Has history of cameron ulcer is followed by Dr. Rosendo Gros and G tube remains in place to prevent hiatal hernia.    Pt seen and examined and agree with the above plan of care.   Family/ staff Communication: with patient and his daughter Neoma Laming, left message with Gertie Fey friend and POA  Labs/tests ordered: Labs and KUB pending.

## 2021-07-09 ENCOUNTER — Encounter: Payer: Self-pay | Admitting: Orthopedic Surgery

## 2021-07-09 ENCOUNTER — Non-Acute Institutional Stay (SKILLED_NURSING_FACILITY): Payer: Medicare Other | Admitting: Orthopedic Surgery

## 2021-07-09 DIAGNOSIS — D500 Iron deficiency anemia secondary to blood loss (chronic): Secondary | ICD-10-CM | POA: Diagnosis not present

## 2021-07-09 DIAGNOSIS — K257 Chronic gastric ulcer without hemorrhage or perforation: Secondary | ICD-10-CM

## 2021-07-09 DIAGNOSIS — R1113 Vomiting of fecal matter: Secondary | ICD-10-CM

## 2021-07-09 DIAGNOSIS — R109 Unspecified abdominal pain: Secondary | ICD-10-CM

## 2021-07-09 DIAGNOSIS — R627 Adult failure to thrive: Secondary | ICD-10-CM

## 2021-07-09 DIAGNOSIS — R441 Visual hallucinations: Secondary | ICD-10-CM

## 2021-07-09 NOTE — Progress Notes (Signed)
Location:   [facility] Room Number: 132-A Place of Service:  SNF [phone]) Provider:  Windell Moulding, NP    Patient Care Team: [name], MD as PCP - General (Internal Medicine)  Extended Emergency Contact Information Primary Emergency Contact: Rudean Hitt Address: [place], [place] [address] [name] of [place] Phone: [phone] Mobile Phone: [phone] Relation: Daughter Secondary Emergency Contact: Simms,Carol Address: [address]          [place], [place] [address] [place] Phone: [phone] Work Phone: [phone] Relation: Other  Code Status:  DNR Goals of care: Advanced Directive information Advanced Directives 07/09/2021  Does Patient Have a Medical Advance Directive? Yes  Type of Paramedic of [place];Living will;Out of facility DNR (pink MOST or yellow form)  Does patient want to make changes to medical advance directive? No - Patient declined  Copy of [place] in Chart? Yes - validated most recent copy scanned in chart (See row information)  Would patient like information on creating a medical advance directive? -  Pre-existing out of facility DNR order (yellow form or pink MOST form) -     Chief Complaint  Patient presents with   Acute Visit    Poor appetite and nausea.    HPI:  Pt is a 87 y.o. male seen today for an acute visit for nausea and poor appetitie.   11/17 he had 2 episodes of vomiting dark brown emesis. Vomiting eventually relieved with prn zofran and phenergan. KUB negative for bowel obstruction, left renal caculi noted. Hgb 9.4 07/04/2021. Remains on ferrous sulfate daily. Since initial incident of vomiting, his appetite has consisted of clear fluids. 11/21 he was noted to be nauseous again. Nursing staff reports nausea relieved with prn zofran. He was also noted to drink less. Today, he is sitting in his  wheelchair during encounter. Nursing staff reports he ate 50-75% of breakfast and lunch today. He denies abdominal pain and nausea today. G tube placed to prevent hiatal hernia. LBM this morning. He has a history of Cameron's ulcers, followed by Dr. Rosendo Gros.   Nursing staff also reports more visual hallucinations. 11/21 he did not sleep at night. He continued to ask staff " when is the baby coming" and " can I touch the ground." No behavioral outbursts towards staff.   11/17 goal of care discussed with daughter. Plans for comfort care and no hospitalizations requested.      Past Medical History:  Diagnosis Date   Acute kidney injury ([place]) 03/06/14   Anemia, unspecified 03/06/14   Arthritis    BPH (benign prostatic hyperplasia)    Cerebral atrophy ([place]) 03/06/14   Cerebrovascular disease 03/06/14   Degenerative disc disease, lumbar 03/06/14   Diverticula, bladder acquired    Edema 03/06/14   GERD (gastroesophageal reflux disease)    H/O: GI bleed 1986   Hiatal hernia    History of fall 03/06/14   Hypercalcemia 03/06/14   Hyperlipidemia    Hypertension    Impotence    LFT elevation 03/06/14   Lumbago 03/06/14   Mallory-Weiss tear 1986   Osteoporosis    Rhabdomyolysis 03/06/14   Scoliosis of cervical spine 03/06/14   Scoliosis of lumbar spine 03/06/14   Shingles    Spermatocele    bilateral, left greater than right   Troponin level elevated 03/06/14   Weak 03/06/14   Past Surgical  History:  Procedure Laterality Date   BACK SURGERY  10/2009   ESI for surgery for lumbar spinal stenosis   BIOPSY  11/24/2020   Procedure: BIOPSY;  Surgeon: Milus Banister, MD;  [facility];  Service: Endoscopy;;   clubbed toe repair  2004   COLONOSCOPY  08/15/2011   ESOPHAGOGASTRODUODENOSCOPY (EGD) WITH PROPOFOL N/A 11/24/2020   Procedure: ESOPHAGOGASTRODUODENOSCOPY (EGD) WITH PROPOFOL;  Surgeon: Milus Banister, MD;  [facility];  Service: Endoscopy;  Laterality: N/A;   HIATAL HERNIA  REPAIR N/A 11/28/2020   Procedure: Gastrostomy tube placement;  Surgeon: Ralene Ok, MD;  [facility];  Service: General;  Laterality: N/A;  90 [place] Left 05/23/2021   Procedure: CANNULATED HIP PINNING;  Surgeon: Rod Can, MD;  [facility];  Service: Orthopedics;  Laterality: Left;  hanna large carm   INGUINAL HERNIA REPAIR Right 11/26/2004   Dr Rebekah Chesterfield - open w mesh   REPLACEMENT TOTAL KNEE BILATERAL  2007, 2008   TONSILLECTOMY AND ADENOIDECTOMY  1939   X-STOP IMPLANTATION      Allergies  Allergen Reactions   Amrix [Cyclobenzaprine]    Shellfish Allergy     Not documented on the [facility]    Allergies as of 07/09/2021       Reactions   Amrix [cyclobenzaprine]    Shellfish Allergy    Not documented on the [facility]        Medication List        Accurate as of July 09, 2021 10:31 AM. If you have any questions, ask your nurse or doctor.          STOP taking these medications    albuterol 108 (90 Base) MCG/ACT inhaler Commonly known as: VENTOLIN HFA Stopped by: Yvonna Alanis, NP       TAKE these medications    antiseptic oral rinse Liqd 15 mLs by Mouth Rinse route 4 (four) times daily as needed for dry mouth.   bisacodyl 10 MG suppository Commonly known as: DULCOLAX Place 10 mg rectally as needed for moderate constipation.   cholestyramine 4 g packet Commonly known as: Questran Take 1 packet (4 g total) by mouth 2 (two) times daily.   lactose free nutrition Liqd Take 237 mLs by mouth daily as needed (as residents request).   levothyroxine 50 MCG tablet Commonly known as: SYNTHROID Take 50 mcg by mouth daily before breakfast.   loperamide 2 MG capsule Commonly known as: IMODIUM Take 2 mg by mouth every 8 (eight) hours as needed for diarrhea or loose stools.   Melatonin 5 MG Caps Take 1 capsule (5 mg total) by mouth at bedtime.   morphine 20 MG/ML concentrated solution Commonly known as: ROXANOL Take 0.25 mLs  (5 mg total) by mouth every 4 (four) hours as needed for severe pain.   ondansetron 4 MG tablet Commonly known as: ZOFRAN Take 4 mg by mouth every 8 (eight) hours as needed for nausea or vomiting.   pantoprazole 40 MG tablet Commonly known as: PROTONIX Take 40 mg by mouth daily.   saccharomyces boulardii 250 MG capsule Commonly known as: FLORASTOR Take 250 mg by mouth 2 (two) times daily.   Systane 0.4-0.3 % Soln Generic drug: Polyethyl Glycol-Propyl Glycol Two drops both eyes three times daily as needed for dry itchy eyes.   tamsulosin 0.4 MG Caps capsule Commonly known as: FLOMAX Take 1 capsule (0.4 mg total) by mouth daily.   triamcinolone cream 0.1 % Commonly known as:  KENALOG Apply 1 application topically to back daily as needed for itching or rash        Review of Systems  Unable to perform ROS: Mental status change   Immunization History  Administered Date(s) Administered   Influenza, High Dose Seasonal PF 06/08/2020   Moderna Sars-Covid-2 Vaccination 09/09/2019, 09/27/2019   Td 09/02/2007   Tdap 11/11/2017, 04/04/2021   Pertinent  Health Maintenance Due  Topic Date Due   DEXA SCAN  04/30/2022 (Originally 12/30/2014)   INFLUENZA VACCINE  Discontinued   Fall Risk 05/27/2021 05/27/2021 05/27/2021 05/28/2021 06/14/2021  Falls in the past year? - - - - 1  Was there an injury with Fall? - - - - 1  Fall Risk Category Calculator - - - - 3  Fall Risk Category - - - - High  Patient Fall Risk Level High fall risk High fall risk High fall risk High fall risk High fall risk  Patient at Risk for Falls Due to - - - - Impaired balance/gait;Impaired mobility  Fall risk Follow up - - - - Falls evaluation completed;Falls prevention discussed   Functional Status Survey:    Vitals:   07/09/21 1024  BP: 108/67  Pulse: 77  Resp: 18  Temp: 97.7 F (36.5 C)  SpO2: 99%  Weight: 131 lb 6.4 oz (59.6 kg)  Height: 5\' 4"  (1.626 m)   Body mass index is 22.55  kg/m. Physical Exam Vitals reviewed.  Constitutional:      Appearance: He is ill-appearing.     Comments: Frail  Eyes:     General:        Right eye: No discharge.        Left eye: No discharge.  Neck:     Vascular: No carotid bruit.  Cardiovascular:     Rate and Rhythm: Normal rate and regular rhythm.     Pulses: Normal pulses.     Heart sounds: Normal heart sounds. No murmur heard. Pulmonary:     Effort: Pulmonary effort is normal. No respiratory distress.     Breath sounds: Normal breath sounds. No wheezing.  Abdominal:     General: Abdomen is flat. Bowel sounds are normal. There is no distension.     Palpations: Abdomen is soft.     Tenderness: There is no abdominal tenderness.     Comments: G tube CDI, clamped, surrounding skin intact.   Musculoskeletal:     Cervical back: Normal range of motion.     Right lower leg: No edema.     Left lower leg: No edema.  Lymphadenopathy:     Cervical: No cervical adenopathy.  Skin:    General: Skin is warm and dry.     Capillary Refill: Capillary refill takes less than 2 seconds.  Neurological:     General: No focal deficit present.     Mental Status: He is alert. Mental status is at baseline.     Motor: Weakness present.     Gait: Gait abnormal.     Comments: wheelchair  Psychiatric:        Mood and Affect: Mood normal. Affect is flat.        Behavior: Behavior normal.        Cognition and Memory: Memory is impaired.     Comments: Alert to self and familiar faces.     Labs reviewed: Recent Labs    11/26/20 0332 11/27/20 0327 11/28/20 0340 11/29/20 1610 12/06/20 0000 05/23/21 0328 05/24/21 0331 05/25/21 0324 06/04/21 0000 06/18/21 0000  07/04/21 0000  NA 134* 134* 132* 136   < > 133* 134* 137 133* 132* 133*  K 3.9 4.0 3.9 4.3   < > 3.8 3.9 4.0 4.4 4.1 3.8  CL 109 106 102 105   < > 102 103 103 103 100 94*  CO2 19* 21* 20* 22   < > 25 25 26 20 22  24*  GLUCOSE 103* 97 98 132*   < > 95 163* 101*  --   --   --   BUN  24* 21 17 20    < > 18 19 22 15 13  13 14   CREATININE 1.38* 1.29* 1.49* 1.49*   < > 1.16 1.28* 1.14 1.2 1.0  1.0 1.1  CALCIUM 7.7* 7.9* 7.7* 8.1*   < > 8.1* 8.4* 8.4* 8.3* 8.1* 8.4*  MG 1.7 2.0 1.8 2.0  --   --   --   --   --   --   --   PHOS 2.6 3.3 3.0  --   --   --   --   --   --   --   --    < > = values in this interval not displayed.   Recent Labs    11/29/20 0338 12/06/20 0000 05/22/21 0538 05/23/21 0328 07/04/21 0000  AST 16   < > 15 13* 14  ALT 12   < > 9 9 7*  ALKPHOS 68   < > 70 74 97  BILITOT 0.7  --  0.5 0.6  --   PROT 6.4*  --  6.3* 6.3*  --   ALBUMIN 2.7*   < > 2.6* 2.6* 3.3*   < > = values in this interval not displayed.   Recent Labs    11/27/20 0327 11/28/20 0340 11/29/20 0338 05/21/21 2007 05/22/21 0538 05/24/21 0331 05/25/21 0324 05/26/21 0321 06/04/21 0000 06/18/21 0000 07/04/21 0000  WBC 8.4 9.2   < > 11.2*   < > 7.9 11.0* 12.1* 7.4 6.3 4.1  NEUTROABS 5.1 5.8  --  7.4  --   --   --   --   --   --   --   HGB 7.6*  8.4* 8.4*   < > 9.5*   < > 8.0* 7.3* 7.6* 8.5* 8.3* 9.4*  HCT 23.8*  26.1* 26.9*   < > 31.0*   < > 26.1* 22.9* 25.1* 27* 25* 29*  MCV 91.3 93.4   < > 80.3   < > 80.6 80.1 80.2  --   --   --   PLT 265 284   < > 533*   < > 497* 442* 511* 555* 421* 458*   < > = values in this interval not displayed.   Lab Results  Component Value Date   TSH 5.07 04/08/2021   Lab Results  Component Value Date   HGBA1C 5.6 11/26/2020   No results found for: CHOL, HDL, LDLCALC, LDLDIRECT, TRIG, CHOLHDL  Significant Diagnostic Results in last 30 days:  No results found.  Assessment/Plan 1. Adult failure to thrive - frail appearance - recent declining appetite - no hospitalizations, comfort care per daughter - recommend consulting hospice  2. Visual hallucinations - occurring more often - cont skilled nursing care  3. 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 Rosendo Gros - cont G  tube care  5. Iron deficiency anemia due to chronic blood loss - see above - cont ferrous sulfate  6. Abdominal discomfort - denies pain today - eating well in last 24 hrs - LBM today     Family/ staff Communication: plan discussed with patient and nurse  Labs/tests ordered:   hospice consult

## 2021-07-10 DIAGNOSIS — R293 Abnormal posture: Secondary | ICD-10-CM | POA: Diagnosis not present

## 2021-07-10 DIAGNOSIS — M7989 Other specified soft tissue disorders: Secondary | ICD-10-CM | POA: Diagnosis not present

## 2021-07-10 DIAGNOSIS — R296 Repeated falls: Secondary | ICD-10-CM | POA: Diagnosis not present

## 2021-07-10 DIAGNOSIS — R54 Age-related physical debility: Secondary | ICD-10-CM | POA: Diagnosis not present

## 2021-07-10 DIAGNOSIS — M6389 Disorders of muscle in diseases classified elsewhere, multiple sites: Secondary | ICD-10-CM | POA: Diagnosis not present

## 2021-07-10 DIAGNOSIS — R278 Other lack of coordination: Secondary | ICD-10-CM | POA: Diagnosis not present

## 2021-07-15 ENCOUNTER — Other Ambulatory Visit: Payer: Self-pay | Admitting: Adult Health

## 2021-07-15 MED ORDER — MORPHINE SULFATE (CONCENTRATE) 20 MG/ML PO SOLN: 5.0000 mg | ORAL | 0 refills | Status: AC | PRN

## 2021-07-18 DEATH — deceased
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
